# Patient Record
Sex: Male | Born: 1950
Health system: Southern US, Community
[De-identification: ages and names within clinical notes are randomized; demographics above are authoritative.]

## PROBLEM LIST (undated history)

## (undated) DIAGNOSIS — J302 Other seasonal allergic rhinitis: Secondary | ICD-10-CM

## (undated) DIAGNOSIS — Z974 Presence of external hearing-aid: Secondary | ICD-10-CM

## (undated) DIAGNOSIS — R351 Nocturia: Secondary | ICD-10-CM

## (undated) DIAGNOSIS — R509 Fever, unspecified: Secondary | ICD-10-CM

## (undated) DIAGNOSIS — Z87828 Personal history of other (healed) physical injury and trauma: Secondary | ICD-10-CM

## (undated) DIAGNOSIS — J45909 Unspecified asthma, uncomplicated: Secondary | ICD-10-CM

## (undated) DIAGNOSIS — N1330 Unspecified hydronephrosis: Secondary | ICD-10-CM

## (undated) DIAGNOSIS — C61 Malignant neoplasm of prostate: Secondary | ICD-10-CM

## (undated) DIAGNOSIS — N281 Cyst of kidney, acquired: Secondary | ICD-10-CM

## (undated) DIAGNOSIS — I10 Essential (primary) hypertension: Secondary | ICD-10-CM

## (undated) DIAGNOSIS — R911 Solitary pulmonary nodule: Secondary | ICD-10-CM

## (undated) DIAGNOSIS — Z95828 Presence of other vascular implants and grafts: Secondary | ICD-10-CM

## (undated) DIAGNOSIS — Z923 Personal history of irradiation: Secondary | ICD-10-CM

## (undated) DIAGNOSIS — Z973 Presence of spectacles and contact lenses: Secondary | ICD-10-CM

## (undated) DIAGNOSIS — K219 Gastro-esophageal reflux disease without esophagitis: Secondary | ICD-10-CM

## (undated) DIAGNOSIS — C787 Secondary malignant neoplasm of liver and intrahepatic bile duct: Secondary | ICD-10-CM

## (undated) DIAGNOSIS — I6203 Nontraumatic chronic subdural hemorrhage: Secondary | ICD-10-CM

## (undated) DIAGNOSIS — K648 Other hemorrhoids: Secondary | ICD-10-CM

## (undated) DIAGNOSIS — R319 Hematuria, unspecified: Secondary | ICD-10-CM

## (undated) DIAGNOSIS — E785 Hyperlipidemia, unspecified: Secondary | ICD-10-CM

---

## 2001-07-28 ENCOUNTER — Emergency Department (HOSPITAL_COMMUNITY): Admission: EM | Admit: 2001-07-28 | Discharge: 2001-07-28 | Payer: Self-pay | Admitting: Emergency Medicine

## 2003-04-28 ENCOUNTER — Emergency Department (HOSPITAL_COMMUNITY): Admission: EM | Admit: 2003-04-28 | Discharge: 2003-04-28 | Payer: Self-pay | Admitting: Emergency Medicine

## 2003-10-25 ENCOUNTER — Emergency Department (HOSPITAL_COMMUNITY): Admission: EM | Admit: 2003-10-25 | Discharge: 2003-10-25 | Payer: Self-pay | Admitting: Emergency Medicine

## 2010-08-05 ENCOUNTER — Inpatient Hospital Stay (HOSPITAL_COMMUNITY): Admission: EM | Admit: 2010-08-05 | Discharge: 2010-08-06 | Payer: Self-pay | Admitting: Emergency Medicine

## 2010-08-22 ENCOUNTER — Encounter: Admission: RE | Admit: 2010-08-22 | Discharge: 2010-09-19 | Payer: Self-pay | Admitting: Internal Medicine

## 2011-03-06 LAB — DIFFERENTIAL
Basophils Absolute: 0 10*3/uL (ref 0.0–0.1)
Basophils Relative: 0 % (ref 0–1)
Eosinophils Absolute: 0.2 10*3/uL (ref 0.0–0.7)
Eosinophils Relative: 2 % (ref 0–5)
Lymphocytes Relative: 10 % — ABNORMAL LOW (ref 12–46)
Lymphs Abs: 0.9 10*3/uL (ref 0.7–4.0)
Monocytes Absolute: 0.3 10*3/uL (ref 0.1–1.0)
Monocytes Relative: 3 % (ref 3–12)
Neutro Abs: 7.2 10*3/uL (ref 1.7–7.7)
Neutrophils Relative %: 84 % — ABNORMAL HIGH (ref 43–77)

## 2011-03-06 LAB — COMPREHENSIVE METABOLIC PANEL
ALT: 15 U/L (ref 0–53)
AST: 17 U/L (ref 0–37)
Albumin: 3.5 g/dL (ref 3.5–5.2)
Alkaline Phosphatase: 72 U/L (ref 39–117)
BUN: 10 mg/dL (ref 6–23)
CO2: 29 mEq/L (ref 19–32)
Calcium: 8.4 mg/dL (ref 8.4–10.5)
Chloride: 108 mEq/L (ref 96–112)
Creatinine, Ser: 0.78 mg/dL (ref 0.4–1.5)
GFR calc Af Amer: >60 mL/min (ref >60)
GFR calc non Af Amer: >60 mL/min (ref >60)
Glucose, Bld: 96 mg/dL (ref 70–99)
Potassium: 3.5 mEq/L (ref 3.5–5.1)
Sodium: 143 mEq/L (ref 135–145)
Total Bilirubin: 0.8 mg/dL (ref 0.3–1.2)
Total Protein: 6.5 g/dL (ref 6.0–8.3)

## 2011-03-06 LAB — CBC
HCT: 38.1 % — ABNORMAL LOW (ref 39.0–52.0)
HCT: 41.4 % (ref 39.0–52.0)
Hemoglobin: 12.9 g/dL — ABNORMAL LOW (ref 13.0–17.0)
Hemoglobin: 14.3 g/dL (ref 13.0–17.0)
MCH: 27.3 pg (ref 26.0–34.0)
MCH: 27.8 pg (ref 26.0–34.0)
MCHC: 33.7 g/dL (ref 30.0–36.0)
MCHC: 34.5 g/dL (ref 30.0–36.0)
MCV: 80.5 fL (ref 78.0–100.0)
MCV: 81 fL (ref 78.0–100.0)
Platelets: 168 10*3/uL (ref 150–400)
Platelets: 174 10*3/uL (ref 150–400)
RBC: 4.71 MIL/uL (ref 4.22–5.81)
RBC: 5.14 MIL/uL (ref 4.22–5.81)
RDW: 13.3 % (ref 11.5–15.5)
RDW: 13.3 % (ref 11.5–15.5)
WBC: 3.6 10*3/uL — ABNORMAL LOW (ref 4.0–10.5)
WBC: 8.6 10*3/uL (ref 4.0–10.5)

## 2011-03-06 LAB — POCT I-STAT, CHEM 8
BUN: 15 mg/dL (ref 6–23)
Calcium, Ion: 1.11 mmol/L — ABNORMAL LOW (ref 1.12–1.32)
Chloride: 104 mEq/L (ref 96–112)
Creatinine, Ser: 0.9 mg/dL (ref 0.4–1.5)
Glucose, Bld: 105 mg/dL — ABNORMAL HIGH (ref 70–99)
HCT: 46 % (ref 39.0–52.0)
Hemoglobin: 15.6 g/dL (ref 13.0–17.0)
Potassium: 3.5 mEq/L (ref 3.5–5.1)
Sodium: 140 mEq/L (ref 135–145)
TCO2: 30 mmol/L (ref 0–100)

## 2011-03-06 LAB — LIPID PANEL
Cholesterol: 218 mg/dL — ABNORMAL HIGH (ref 0–200)
HDL: 38 mg/dL — ABNORMAL LOW (ref >39)
LDL Cholesterol: 153 mg/dL — ABNORMAL HIGH (ref 0–99)
Total CHOL/HDL Ratio: 5.7 RATIO
Triglycerides: 136 mg/dL (ref <150)
VLDL: 27 mg/dL (ref 0–40)

## 2011-03-06 LAB — TSH: TSH: 1.001 u[IU]/mL (ref 0.350–4.500)

## 2011-03-06 LAB — CK TOTAL AND CKMB (NOT AT ARMC)
CK, MB: 5.7 ng/mL — ABNORMAL HIGH (ref 0.3–4.0)
Relative Index: 3.6 — ABNORMAL HIGH (ref 0.0–2.5)
Total CK: 158 U/L (ref 7–232)

## 2011-03-06 LAB — HEMOGLOBIN A1C
Hgb A1c MFr Bld: 5.4 % (ref <5.7)
Mean Plasma Glucose: 108 mg/dL (ref <117)

## 2012-10-26 ENCOUNTER — Emergency Department (HOSPITAL_COMMUNITY): Payer: Self-pay

## 2012-10-26 ENCOUNTER — Encounter (HOSPITAL_COMMUNITY): Payer: Self-pay | Admitting: Family Medicine

## 2012-10-26 ENCOUNTER — Emergency Department (HOSPITAL_COMMUNITY)
Admission: EM | Admit: 2012-10-26 | Discharge: 2012-10-26 | Disposition: A | Discharge status: Home / Self Care | Payer: Self-pay | Attending: Emergency Medicine | Admitting: Emergency Medicine

## 2012-10-26 DIAGNOSIS — R42 Dizziness and giddiness: Secondary | ICD-10-CM | POA: Insufficient documentation

## 2012-10-26 DIAGNOSIS — J45909 Unspecified asthma, uncomplicated: Secondary | ICD-10-CM | POA: Insufficient documentation

## 2012-10-26 DIAGNOSIS — R079 Chest pain, unspecified: Secondary | ICD-10-CM | POA: Insufficient documentation

## 2012-10-26 LAB — CBC WITH DIFFERENTIAL/PLATELET
Basophils Absolute: 0 10*3/uL (ref 0.0–0.1)
Basophils Relative: 0 % (ref 0–1)
Eosinophils Absolute: 0.3 10*3/uL (ref 0.0–0.7)
Eosinophils Relative: 5 % (ref 0–5)
HCT: 39.9 % (ref 39.0–52.0)
Hemoglobin: 13.8 g/dL (ref 13.0–17.0)
Lymphocytes Relative: 36 % (ref 12–46)
Lymphs Abs: 1.8 10*3/uL (ref 0.7–4.0)
MCH: 27.3 pg (ref 26.0–34.0)
MCHC: 34.6 g/dL (ref 30.0–36.0)
MCV: 79 fL (ref 78.0–100.0)
Monocytes Absolute: 0.4 10*3/uL (ref 0.1–1.0)
Monocytes Relative: 8 % (ref 3–12)
Neutro Abs: 2.5 10*3/uL (ref 1.7–7.7)
Neutrophils Relative %: 51 % (ref 43–77)
Platelets: 192 10*3/uL (ref 150–400)
RBC: 5.05 MIL/uL (ref 4.22–5.81)
RDW: 12.8 % (ref 11.5–15.5)
WBC: 5 10*3/uL (ref 4.0–10.5)

## 2012-10-26 LAB — BASIC METABOLIC PANEL
BUN: 15 mg/dL (ref 6–23)
CO2: 29 mEq/L (ref 19–32)
Calcium: 9.4 mg/dL (ref 8.4–10.5)
Chloride: 99 mEq/L (ref 96–112)
Creatinine, Ser: 1 mg/dL (ref 0.50–1.35)
GFR calc Af Amer: >90 mL/min (ref >90)
GFR calc non Af Amer: 79 mL/min — ABNORMAL LOW (ref >90)
Glucose, Bld: 125 mg/dL — ABNORMAL HIGH (ref 70–99)
Potassium: 3.6 mEq/L (ref 3.5–5.1)
Sodium: 139 mEq/L (ref 135–145)

## 2012-10-26 LAB — TROPONIN I: Troponin I: <0.3 ng/mL (ref <0.30)

## 2012-10-26 LAB — POCT I-STAT TROPONIN I: Troponin i, poc: 0.04 ng/mL (ref 0.00–0.08)

## 2012-10-26 MED ORDER — MECLIZINE HCL 25 MG PO TABS: 25.0000 mg | ORAL_TABLET | Freq: Three times a day (TID) | ORAL | Status: DC | PRN

## 2012-10-26 MED ORDER — MECLIZINE HCL 25 MG PO TABS
25.0000 mg | ORAL_TABLET | Freq: Once | ORAL | Status: AC
  Administered 2012-10-26: 25 mg via ORAL
  Filled 2012-10-26: qty 1

## 2012-10-26 MED ORDER — GI COCKTAIL ~~LOC~~
30.0000 mL | Freq: Once | ORAL | Status: AC
  Administered 2012-10-26: 30 mL via ORAL
  Filled 2012-10-26: qty 30

## 2012-10-26 MED ORDER — IBUPROFEN 400 MG PO TABS
600.0000 mg | ORAL_TABLET | Freq: Once | ORAL | Status: AC
  Administered 2012-10-26: 600 mg via ORAL
  Filled 2012-10-26: qty 2

## 2012-10-26 NOTE — ED Provider Notes (Signed)
I saw and evaluated the patient, reviewed the resident's note and I agree with the findings and plan. I have reviewed EKG and agree with the resident interpretation.  you Patient with history of vertigo who presents today do to feeling dizzy. He was recently evaluated for this with a head CT that showed bilateral hygromas. Patient denies any chest pain currently. He states he gets it about once a month with lying down it sounds more like reflux. He has no medical problems other than asthma and is low risk for cardiac disease.  Labs here today were benign EKG is unrevealing. Patient was discharged home.  Gwyneth Sprout, MD 10/26/12 2357

## 2012-10-26 NOTE — ED Notes (Addendum)
Per family pt having chest pain, dizziness and weakness that has been going on for a while. sts nausea. Denies SOB. sts pain is worse when he is sleeping.

## 2012-10-26 NOTE — ED Notes (Signed)
Patient transported to CT 

## 2012-10-26 NOTE — ED Provider Notes (Signed)
History     CSN: [id number]  Arrival date & time 10/26/12  1655   First MD Initiated Contact with Patient 10/26/12 1904      Chief Complaint  Patient presents with  . Chest Pain    (Consider location/radiation/quality/duration/timing/severity/associated sxs/prior treatment) HPI Interpreter used to obtain history. Used interpreter phone. Also discussed with several of patient's family members.  [name] Johnston is 61 y.o. 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 Describes the dizziness as being similar to when he was diagnosed with vertigo during a previous visit at [facility]. Currently is asymptomatic. Denies any chest pain today or now but does state that he occasionally gets chest pain at night when he lies down flat. Described as a burning. He has not taken any medications. Pt does not have a PCP. Dizziness made worse by standing up and position changes. Described as moderately severe. Occurred in past 1-2 hours. Denies any LOC, no fever/chills. No SOB. Does complain of some R heel pain from prior fall at work   Past Medical History  Diagnosis Date  . Asthma     History reviewed. No pertinent past surgical history.  History reviewed. No pertinent family history.  History  Substance Use Topics  . Smoking status: Never Smoker   . Smokeless tobacco: Not on file  . Alcohol Use: No      Review of Systems Negative for respiratory distress, cough. Negative for vomiting, diarrhea.  All other systems reviewed and negative unless noted in HPI.    Allergies  Aspirin  Home Medications  No current outpatient prescriptions on file.  BP 147/74  Pulse 67  Temp 98 F (36.7 C) (Oral)  Resp 18  SpO2 96%  Physical Exam Nursing note and vitals reviewed.  Constitutional: Pt is alert and appears stated age. Oropharynx: Airway open without erythema or exudate. Respiratory: No respiratory distress. Equal breathing bilaterally. CV:  Extremities warm and well perfused. Neuro: No motor nor sensory deficit. GCS 15. Normal coordination.  Head: Normocephalic and atraumatic. Eyes: No conjunctivitis, no scleral icterus. Neck: Supple, no mass. Chest: Non-tender. Abdomen: Soft, non-tender MSK: Extremities are atraumatic without deformity. R achillies intact with no  Skin: No rash, no wounds.  ED Course  Procedures (including critical care time)  Labs Reviewed  BASIC METABOLIC PANEL - Abnormal; Notable for the following:    Glucose, Bld 125 (*)     GFR calc non Af Amer 79 (*)     All other components within normal limits  CBC WITH DIFFERENTIAL  POCT I-STAT TROPONIN I   Dg Chest 2 View  10/26/2012  *RADIOLOGY REPORT*  Clinical Data: Chest pain  CHEST - 2 VIEW  Comparison: 08/05/2010  Findings: Cardiomediastinal silhouette is stable.  No acute infiltrate or pleural effusion.  No pulmonary edema.  Mild degenerative changes lower thoracic spine.  IMPRESSION: No active disease.  No significant change.   Original Report Authenticated By: Natasha Mead, M.D.      1. Vertigo   2. Chest pain       MDM  61 y.o. male here with non-specific dizziness described more consistently with vertigo. History not c/w with pre-syncope. Even though chest pain as chief complaint pt without any chest pain now, during event, or today. Only at night while lying down more consistent with GERD. 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 Chart review shows bilateral subdural hygromas, left  greater than right. Work up here with unchanged CT head with NAICa, CXR with no active disease. EKG ordered and interpreted by me: NSR, no axis deviation, no QRS widening, no ST segment changes and no prior EKG available for comparison.  Lab tests ordered and reviewed by me: CBC, BMP unremarkable. Troponin low. On re-eval, pt resting comfortably with NAD. Complains of mild frontal headache. Able to get patient up to  walk which he can do without difficulty. Plan to treat symptoms and have pt f/u with PCP. Able to discuss with daughter who is now at bedside. Discussed results at length. Questions answered. Patient does not have PCP but daughter has been prompting him to get one and will call to schedule an appointment for him. Discussed return precautions at length and had pt and family verbalize understanding.   Medical Decision Making discussed with ED attending Gwyneth Sprout, MD          Charm Barges, MD 10/26/12 [phone]

## 2012-10-26 NOTE — ED Notes (Signed)
Pt back from CT

## 2012-10-26 NOTE — ED Notes (Signed)
EKG completed at 1710 and signed by Dr. Lynelle Doctor. NO old ekg.

## 2012-10-26 NOTE — ED Notes (Signed)
EDP at [facility] with translator phone.

## 2012-10-26 NOTE — ED Notes (Signed)
Discharge instructions reviewed with pt and family; son verbalized understanding of follow-up care and given a list of resources for a pcp as requested.

## 2013-04-17 ENCOUNTER — Ambulatory Visit (INDEPENDENT_AMBULATORY_CARE_PROVIDER_SITE_OTHER): Payer: PRIVATE HEALTH INSURANCE | Admitting: Family Medicine

## 2013-04-17 VITALS — BP 118/80 | HR 70 | Temp 98.8°F | Resp 16 | Ht 61.0 in | Wt 138.0 lb

## 2013-04-17 DIAGNOSIS — R972 Elevated prostate specific antigen [PSA]: Secondary | ICD-10-CM

## 2013-04-17 DIAGNOSIS — R51 Headache: Secondary | ICD-10-CM

## 2013-04-17 DIAGNOSIS — R300 Dysuria: Secondary | ICD-10-CM

## 2013-04-17 DIAGNOSIS — L309 Dermatitis, unspecified: Secondary | ICD-10-CM

## 2013-04-17 LAB — POCT UA - MICROSCOPIC ONLY
Casts, Ur, LPF, POC: NEGATIVE
Crystals, Ur, HPF, POC: NEGATIVE
Yeast, UA: NEGATIVE

## 2013-04-17 LAB — POCT URINALYSIS DIPSTICK
Leukocytes, UA: NEGATIVE
Protein, UA: 30
Urobilinogen, UA: 0.2

## 2013-04-17 MED ORDER — TRIAMCINOLONE ACETONIDE 0.1 % EX CREA: TOPICAL_CREAM | Freq: Two times a day (BID) | CUTANEOUS | Status: DC

## 2013-04-17 MED ORDER — CEPHALEXIN 500 MG PO CAPS: 500.0000 mg | ORAL_CAPSULE | Freq: Two times a day (BID) | ORAL | Status: DC

## 2013-04-17 MED ORDER — TRAMADOL HCL 50 MG PO TABS: 50.0000 mg | ORAL_TABLET | Freq: Three times a day (TID) | ORAL | Status: DC | PRN
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
## 2013-04-17 NOTE — Patient Instructions (Addendum)
We are going to arrange an appointment for you to see a specialist regarding your headaches. If they change in the meantime please let me know.  You may also try the tramadol for your headaches in the meantime.  Please use the keflex antibiotic for a possible urinary tract infection. I will let you know when the rest of your labs come in.    Use the triamcinolone cream on your legs, up to twice a day as needed.  Let me know if not better in the next couple of weeks

## 2013-04-18 NOTE — Addendum Note (Signed)
Addended by: Abbe Amsterdam C on: 04/18/2013 07:53 AM   Modules accepted: Orders

## 2013-04-19 LAB — URINE CULTURE: Colony Count: NO GROWTH

## 2013-04-21 ENCOUNTER — Telehealth: Payer: Self-pay

## 2013-04-21 ENCOUNTER — Telehealth: Payer: Self-pay | Admitting: Radiology

## 2013-04-21 NOTE — Telephone Encounter (Signed)
SIMON STATES HE HAVE TO COME AND P/U A CD FOR HIS DAD. DR COPLAND REFERRED HIM, BUT THEY NEED THE CD BEFORE THAY CAN SCHEDULE PLEASE CALL [phone]

## 2013-04-21 NOTE — Telephone Encounter (Signed)
Request given to Xray 

## 2013-04-21 NOTE — Telephone Encounter (Signed)
Spoke with Melvenia Beam explaining xrays not performed here at our office; most recent xrays- CT scan in 2013.  Referrals in progress.  Melvenia Beam states father [place]) still having pain with urination and difficult time urinating.  Told Simon to bring father in if he can not urinate or is in distress.  In meantime will check on referral to urologist.

## 2013-04-25 NOTE — Telephone Encounter (Signed)
Called to check on him and spoke to [place].  Melvenia Beam stated that his dad has a bad HA and has vomited.  Instructed him to take his father in to the ER or the clinic today.  He took this under advisement and plans to bring him in.

## 2013-05-06 NOTE — Pre-Procedure Instructions (Addendum)
Elvie Maines  05/06/2013   Your procedure is scheduled on:  Tuesday, May 20th  Report to [facility] at 0915 AM.  Call this number if you have problems the morning of surgery: [phone]   Remember:   Do not eat food or drink liquids after midnight.    Take these medicines the morning of surgery with A SIP OF WATER:                albuterol (PROVENTIL HFA;VENTOLIN HFA) 108 (90 BASE) MCG/ACT inhaler, for wheezing  traMADol (ULTRAM) 50 MG tablet for pain  Stop taking Aspirin, and herbal medications. Do not take any NSAIDs ie: Ibuprofen, Advil, Naproxen or any medication containing Aspirin.  Do not wear jewelry, make-up or nail polish.  Do not wear lotions, powders, or perfumes. You may wear deodorant.  Do not shave 48 hours prior to surgery. Men may shave face and neck.  Do not bring valuables to the hospital.  Contacts, dentures or bridgework may not be worn into surgery.  Leave suitcase in the car. After surgery it may be brought to your room.  For patients admitted to the hospital, checkout time is 11:00 AM the day of discharge.   Patients discharged the day of surgery will not be allowed to drive home.   Special Instructions: Shower using CHG 2 nights before surgery and the night before surgery.  If you shower the day of surgery use CHG.  Use special wash - you have one bottle of CHG for all showers.  You should use approximately 1/3 of the bottle for each shower.   Please read over the following fact sheets that you were given: Pain Booklet, Coughing and Deep Breathing, MRSA Information and Surgical Site Infection Prevention

## 2013-05-08 ENCOUNTER — Telehealth: Payer: Self-pay

## 2013-05-08 ENCOUNTER — Encounter (HOSPITAL_COMMUNITY): Payer: Self-pay

## 2013-05-08 ENCOUNTER — Encounter (HOSPITAL_COMMUNITY)
Admission: RE | Admit: 2013-05-08 | Discharge: 2013-05-08 | Disposition: A | Discharge status: Home / Self Care | Payer: PRIVATE HEALTH INSURANCE | Source: Ambulatory Visit | Attending: Neurosurgery | Admitting: Neurosurgery

## 2013-05-08 ENCOUNTER — Encounter (HOSPITAL_COMMUNITY)
Admission: RE | Admit: 2013-05-08 | Discharge: 2013-05-08 | Disposition: A | Discharge status: Home / Self Care | Payer: PRIVATE HEALTH INSURANCE | Source: Ambulatory Visit | Attending: Anesthesiology | Admitting: Anesthesiology

## 2013-05-08 ENCOUNTER — Other Ambulatory Visit: Payer: Self-pay | Admitting: Neurosurgery

## 2013-05-08 HISTORY — DX: Other seasonal allergic rhinitis: J30.2

## 2013-05-08 HISTORY — DX: Hyperlipidemia, unspecified: E78.5

## 2013-05-08 LAB — CBC
Platelets: 183 10*3/uL (ref 150–400)
RBC: 5.17 MIL/uL (ref 4.22–5.81)
RDW: 12.6 % (ref 11.5–15.5)
WBC: 5.6 10*3/uL (ref 4.0–10.5)

## 2013-05-08 LAB — SURGICAL PCR SCREEN
MRSA, PCR: NEGATIVE
Staphylococcus aureus: NEGATIVE

## 2013-05-08 LAB — BASIC METABOLIC PANEL
Calcium: 9.2 mg/dL (ref 8.4–10.5)
Creatinine, Ser: 0.88 mg/dL (ref 0.50–1.35)
GFR calc non Af Amer: 90 mL/min — ABNORMAL LOW (ref >90)
Sodium: 139 mEq/L (ref 135–145)

## 2013-05-08 NOTE — Progress Notes (Signed)
Pt advised to bring all medications for pharmacy review on DOS because unsure of some medications. Pt reminded not to forget inhaler.

## 2013-05-08 NOTE — Telephone Encounter (Signed)
No, he is not cleared, he can come in if he needs to be seen. She states he has been cleared from Urology already.

## 2013-05-08 NOTE — Telephone Encounter (Signed)
SARAH STATES HER FATHER HAVE TO HAVE SURGERY IN THE MORNING AND DR POOLE FROM [facility] WANTED TO MAKE SURE PT WAS CLEARED TO HAVE THE SURGERY SINCE SOMETHING WAS WRONG WITH HIS PROSTATE. PLEASE CALL [facility] AT [phone] AND YOU MAY CALL THE SECRETARY AT [phone] [phone] AND LET HER KNOW

## 2013-05-08 NOTE — Progress Notes (Signed)
Pt denies SOB, chest pain, and being under the care of a cardiologist. Pt speaks [place] however, refuses the services of an interpreter ( after pt education on the consequences of such a refusal); pt insists on using oldest daughter.

## 2013-05-09 ENCOUNTER — Encounter (HOSPITAL_COMMUNITY): Payer: Self-pay | Admitting: Anesthesiology

## 2013-05-09 ENCOUNTER — Ambulatory Visit (HOSPITAL_COMMUNITY): Payer: PRIVATE HEALTH INSURANCE | Admitting: Anesthesiology

## 2013-05-09 ENCOUNTER — Encounter (HOSPITAL_COMMUNITY)
Admission: RE | Disposition: A | Discharge status: Home / Self Care | Payer: Self-pay | Source: Ambulatory Visit | Attending: Neurosurgery

## 2013-05-09 ENCOUNTER — Inpatient Hospital Stay (HOSPITAL_COMMUNITY)
Admission: RE | Admit: 2013-05-09 | Discharge: 2013-05-10 | DRG: 027 | Disposition: A | Discharge status: Home / Self Care | Payer: PRIVATE HEALTH INSURANCE | Source: Ambulatory Visit | Attending: Neurosurgery | Admitting: Neurosurgery

## 2013-05-09 DIAGNOSIS — E785 Hyperlipidemia, unspecified: Secondary | ICD-10-CM | POA: Diagnosis present

## 2013-05-09 DIAGNOSIS — S065XAA Traumatic subdural hemorrhage with loss of consciousness status unknown, initial encounter: Secondary | ICD-10-CM | POA: Diagnosis present

## 2013-05-09 DIAGNOSIS — S065X9A Traumatic subdural hemorrhage with loss of consciousness of unspecified duration, initial encounter: Secondary | ICD-10-CM | POA: Diagnosis present

## 2013-05-09 DIAGNOSIS — I6200 Nontraumatic subdural hemorrhage, unspecified: Principal | ICD-10-CM | POA: Diagnosis present

## 2013-05-09 DIAGNOSIS — R339 Retention of urine, unspecified: Secondary | ICD-10-CM | POA: Diagnosis present

## 2013-05-09 DIAGNOSIS — J45909 Unspecified asthma, uncomplicated: Secondary | ICD-10-CM | POA: Diagnosis present

## 2013-05-09 DIAGNOSIS — Z886 Allergy status to analgesic agent status: Secondary | ICD-10-CM

## 2013-05-09 DIAGNOSIS — R51 Headache: Secondary | ICD-10-CM | POA: Diagnosis present

## 2013-05-09 DIAGNOSIS — Z79899 Other long term (current) drug therapy: Secondary | ICD-10-CM

## 2013-05-09 HISTORY — PX: BURR HOLE: SHX908

## 2013-05-09 SURGERY — CREATION, CRANIAL BURR HOLE
Anesthesia: General | Site: Head | Laterality: Left | Wound class: Clean

## 2013-05-09 MED ORDER — THROMBIN 20000 UNITS EX SOLR
CUTANEOUS | Status: DC | PRN
  Administered 2013-05-09: 11:00:00 via TOPICAL

## 2013-05-09 MED ORDER — TAMSULOSIN HCL 0.4 MG PO CAPS
0.8000 mg | ORAL_CAPSULE | Freq: Every day | ORAL | Status: DC
  Administered 2013-05-09: 0.8 mg via ORAL
  Filled 2013-05-09 (×2): qty 2

## 2013-05-09 MED ORDER — ROCURONIUM BROMIDE 100 MG/10ML IV SOLN
INTRAVENOUS | Status: DC | PRN
  Administered 2013-05-09: 30 mg via INTRAVENOUS

## 2013-05-09 MED ORDER — ARTIFICIAL TEARS OP OINT
TOPICAL_OINTMENT | OPHTHALMIC | Status: DC | PRN
  Administered 2013-05-09: 1 via OPHTHALMIC

## 2013-05-09 MED ORDER — ACETAMINOPHEN 650 MG RE SUPP: 650.0000 mg | RECTAL | Status: DC | PRN

## 2013-05-09 MED ORDER — ONDANSETRON HCL 4 MG/2ML IJ SOLN
INTRAMUSCULAR | Status: DC | PRN
  Administered 2013-05-09: 4 mg via INTRAVENOUS

## 2013-05-09 MED ORDER — PROMETHAZINE HCL 25 MG PO TABS: 12.5000 mg | ORAL_TABLET | ORAL | Status: DC | PRN

## 2013-05-09 MED ORDER — CEFAZOLIN SODIUM 1-5 GM-% IV SOLN
1.0000 g | Freq: Once | INTRAVENOUS | Status: AC
  Administered 2013-05-09: 1 g via INTRAVENOUS

## 2013-05-09 MED ORDER — ONDANSETRON HCL 4 MG/2ML IJ SOLN
4.0000 mg | INTRAMUSCULAR | Status: DC | PRN
  Administered 2013-05-09: 4 mg via INTRAVENOUS
  Filled 2013-05-09: qty 2

## 2013-05-09 MED ORDER — ACETAMINOPHEN 325 MG PO TABS: 650.0000 mg | ORAL_TABLET | ORAL | Status: DC | PRN

## 2013-05-09 MED ORDER — LACTATED RINGERS IV SOLN
INTRAVENOUS | Status: DC | PRN
  Administered 2013-05-09 (×2): via INTRAVENOUS

## 2013-05-09 MED ORDER — CEFAZOLIN SODIUM 1-5 GM-% IV SOLN
1.0000 g | Freq: Three times a day (TID) | INTRAVENOUS | Status: AC
  Administered 2013-05-09 – 2013-05-10 (×2): 1 g via INTRAVENOUS
  Filled 2013-05-09 (×2): qty 50

## 2013-05-09 MED ORDER — OXYCODONE HCL 5 MG PO TABS: 5.0000 mg | ORAL_TABLET | Freq: Once | ORAL | Status: DC | PRN

## 2013-05-09 MED ORDER — LIDOCAINE HCL (CARDIAC) 20 MG/ML IV SOLN
INTRAVENOUS | Status: DC | PRN
  Administered 2013-05-09: 50 mg via INTRAVENOUS

## 2013-05-09 MED ORDER — LIDOCAINE HCL 4 % MT SOLN
OROMUCOSAL | Status: DC | PRN
  Administered 2013-05-09: 4 mL via TOPICAL

## 2013-05-09 MED ORDER — LIDOCAINE-EPINEPHRINE 1 %-1:100000 IJ SOLN
INTRAMUSCULAR | Status: DC | PRN
  Administered 2013-05-09: 6 mL

## 2013-05-09 MED ORDER — 0.9 % SODIUM CHLORIDE (POUR BTL) OPTIME
TOPICAL | Status: DC | PRN
  Administered 2013-05-09: 1000 mL

## 2013-05-09 MED ORDER — GLYCOPYRROLATE 0.2 MG/ML IJ SOLN
INTRAMUSCULAR | Status: DC | PRN
  Administered 2013-05-09: 0.4 mg via INTRAVENOUS

## 2013-05-09 MED ORDER — EPHEDRINE SULFATE 50 MG/ML IJ SOLN
INTRAMUSCULAR | Status: DC | PRN
  Administered 2013-05-09 (×6): 5 mg via INTRAVENOUS

## 2013-05-09 MED ORDER — OXYCODONE HCL 5 MG/5ML PO SOLN: 5.0000 mg | Freq: Once | ORAL | Status: DC | PRN

## 2013-05-09 MED ORDER — LABETALOL HCL 5 MG/ML IV SOLN: 10.0000 mg | INTRAVENOUS | Status: DC | PRN

## 2013-05-09 MED ORDER — CEFAZOLIN SODIUM 1-5 GM-% IV SOLN
INTRAVENOUS | Status: AC
  Filled 2013-05-09: qty 50

## 2013-05-09 MED ORDER — HYDROMORPHONE HCL PF 1 MG/ML IJ SOLN: 0.2500 mg | INTRAMUSCULAR | Status: DC | PRN

## 2013-05-09 MED ORDER — PROPOFOL 10 MG/ML IV BOLUS
INTRAVENOUS | Status: DC | PRN
  Administered 2013-05-09: 140 mg via INTRAVENOUS

## 2013-05-09 MED ORDER — NEOSTIGMINE METHYLSULFATE 1 MG/ML IJ SOLN
INTRAMUSCULAR | Status: DC | PRN
  Administered 2013-05-09: 3 mg via INTRAVENOUS

## 2013-05-09 MED ORDER — PANTOPRAZOLE SODIUM 40 MG IV SOLR
40.0000 mg | Freq: Every day | INTRAVENOUS | Status: DC
  Administered 2013-05-09: 40 mg via INTRAVENOUS
  Filled 2013-05-09 (×2): qty 40

## 2013-05-09 MED ORDER — HYDROCODONE-ACETAMINOPHEN 5-325 MG PO TABS
1.0000 | ORAL_TABLET | ORAL | Status: DC | PRN
  Administered 2013-05-09 – 2013-05-10 (×5): 1 via ORAL
  Filled 2013-05-09 (×5): qty 1

## 2013-05-09 MED ORDER — ONDANSETRON HCL 4 MG PO TABS: 4.0000 mg | ORAL_TABLET | ORAL | Status: DC | PRN

## 2013-05-09 MED ORDER — BACITRACIN 50000 UNITS IM SOLR
INTRAMUSCULAR | Status: AC
  Filled 2013-05-09: qty 1

## 2013-05-09 MED ORDER — HYDROMORPHONE HCL PF 1 MG/ML IJ SOLN
0.5000 mg | INTRAMUSCULAR | Status: DC | PRN
  Administered 2013-05-09: 1 mg via INTRAVENOUS
  Filled 2013-05-09: qty 1

## 2013-05-09 MED ORDER — SODIUM CHLORIDE 0.9 % IV SOLN
INTRAVENOUS | Status: AC
  Filled 2013-05-09: qty 500

## 2013-05-09 MED ORDER — SODIUM CHLORIDE 0.9 % IR SOLN
Status: DC | PRN
  Administered 2013-05-09: 11:00:00

## 2013-05-09 MED ORDER — PHENYLEPHRINE HCL 10 MG/ML IJ SOLN
INTRAMUSCULAR | Status: DC | PRN
  Administered 2013-05-09: 40 ug via INTRAVENOUS

## 2013-05-09 MED ORDER — FENTANYL CITRATE 0.05 MG/ML IJ SOLN
INTRAMUSCULAR | Status: DC | PRN
  Administered 2013-05-09 (×3): 25 ug via INTRAVENOUS

## 2013-05-09 MED ORDER — ALBUTEROL SULFATE HFA 108 (90 BASE) MCG/ACT IN AERS: 1.0000 | INHALATION_SPRAY | RESPIRATORY_TRACT | Status: DC | PRN

## 2013-05-09 MED ORDER — PROMETHAZINE HCL 25 MG/ML IJ SOLN: 6.2500 mg | INTRAMUSCULAR | Status: DC | PRN

## 2013-05-09 MED ORDER — TRAMADOL HCL 50 MG PO TABS
50.0000 mg | ORAL_TABLET | Freq: Four times a day (QID) | ORAL | Status: DC | PRN
  Administered 2013-05-10: 100 mg via ORAL
  Filled 2013-05-09: qty 2

## 2013-05-09 SURGICAL SUPPLY — 60 items
BAG DECANTER FOR FLEXI CONT (MISCELLANEOUS) ×2 IMPLANT
BANDAGE GAUZE 4  KLING STR (GAUZE/BANDAGES/DRESSINGS) IMPLANT
BLADE SURG 11 STRL SS (BLADE) ×2 IMPLANT
BLADE SURG ROTATE 9660 (MISCELLANEOUS) IMPLANT
BNDG COHESIVE 4X5 TAN NS LF (GAUZE/BANDAGES/DRESSINGS) IMPLANT
BRUSH SCRUB EZ PLAIN DRY (MISCELLANEOUS) ×2 IMPLANT
BUR ACORN 6.0 (BURR) ×2 IMPLANT
BUR ADDG 1.1 (BURR) IMPLANT
CANISTER SUCTION 2500CC (MISCELLANEOUS) ×2 IMPLANT
CLIP TI MEDIUM 6 (CLIP) IMPLANT
CLOTH BEACON ORANGE TIMEOUT ST (SAFETY) ×2 IMPLANT
CONT SPEC 4OZ CLIKSEAL STRL BL (MISCELLANEOUS) ×2 IMPLANT
CORDS BIPOLAR (ELECTRODE) ×2 IMPLANT
DECANTER SPIKE VIAL GLASS SM (MISCELLANEOUS) ×2 IMPLANT
DERMABOND ADHESIVE PROPEN (GAUZE/BANDAGES/DRESSINGS) ×1
DERMABOND ADVANCED (GAUZE/BANDAGES/DRESSINGS) ×1
DERMABOND ADVANCED .7 DNX12 (GAUZE/BANDAGES/DRESSINGS) ×1 IMPLANT
DERMABOND ADVANCED .7 DNX6 (GAUZE/BANDAGES/DRESSINGS) ×1 IMPLANT
DRAPE NEUROLOGICAL W/INCISE (DRAPES) IMPLANT
DRAPE WARM FLUID 44X44 (DRAPE) ×2 IMPLANT
ELECT CAUTERY BLADE 6.4 (BLADE) ×2 IMPLANT
ELECT REM PT RETURN 9FT ADLT (ELECTROSURGICAL) ×2
ELECTRODE REM PT RTRN 9FT ADLT (ELECTROSURGICAL) ×1 IMPLANT
GAUZE SPONGE 4X4 16PLY XRAY LF (GAUZE/BANDAGES/DRESSINGS) IMPLANT
GLOVE ECLIPSE 8.5 STRL (GLOVE) IMPLANT
GLOVE EXAM NITRILE LRG STRL (GLOVE) IMPLANT
GLOVE EXAM NITRILE MD LF STRL (GLOVE) IMPLANT
GLOVE EXAM NITRILE XL STR (GLOVE) IMPLANT
GLOVE EXAM NITRILE XS STR PU (GLOVE) IMPLANT
GOWN BRE IMP SLV AUR LG STRL (GOWN DISPOSABLE) ×2 IMPLANT
GOWN BRE IMP SLV AUR XL STRL (GOWN DISPOSABLE) ×2 IMPLANT
GOWN STRL REIN 2XL LVL4 (GOWN DISPOSABLE) ×2 IMPLANT
HEMOSTAT SURGICEL 2X14 (HEMOSTASIS) IMPLANT
HOOK DURA (MISCELLANEOUS) ×2 IMPLANT
KIT BASIN OR (CUSTOM PROCEDURE TRAY) ×2 IMPLANT
KIT ROOM TURNOVER OR (KITS) ×2 IMPLANT
NEEDLE HYPO 25X1 1.5 SAFETY (NEEDLE) ×2 IMPLANT
NS IRRIG 1000ML POUR BTL (IV SOLUTION) ×2 IMPLANT
PACK CRANIOTOMY (CUSTOM PROCEDURE TRAY) ×2 IMPLANT
PAD ARMBOARD 7.5X6 YLW CONV (MISCELLANEOUS) ×6 IMPLANT
PATTIES SURGICAL .25X.25 (GAUZE/BANDAGES/DRESSINGS) IMPLANT
PATTIES SURGICAL .5 X.5 (GAUZE/BANDAGES/DRESSINGS) IMPLANT
PATTIES SURGICAL .5 X3 (DISPOSABLE) IMPLANT
PATTIES SURGICAL 1X1 (DISPOSABLE) IMPLANT
PIN MAYFIELD SKULL DISP (PIN) IMPLANT
PLATE 1.5/0.5 13MM BURR HOLE (Plate) ×2 IMPLANT
SCREW SELF DRILL HT 1.5/4MM (Screw) ×4 IMPLANT
SPONGE GAUZE 4X4 12PLY (GAUZE/BANDAGES/DRESSINGS) IMPLANT
SPONGE NEURO XRAY DETECT 1X3 (DISPOSABLE) IMPLANT
SPONGE SURGIFOAM ABS GEL 100 (HEMOSTASIS) IMPLANT
STAPLER VISISTAT 35W (STAPLE) ×2 IMPLANT
SUT NURALON 4 0 TR CR/8 (SUTURE) ×4 IMPLANT
SUT VIC AB 2-0 CT1 18 (SUTURE) ×2 IMPLANT
SUT VIC AB 3-0 SH 8-18 (SUTURE) ×2 IMPLANT
SYR 20ML ECCENTRIC (SYRINGE) ×2 IMPLANT
SYR CONTROL 10ML LL (SYRINGE) ×2 IMPLANT
TOWEL OR 17X24 6PK STRL BLUE (TOWEL DISPOSABLE) ×2 IMPLANT
TOWEL OR 17X26 10 PK STRL BLUE (TOWEL DISPOSABLE) ×2 IMPLANT
TRAY FOLEY CATH 14FRSI W/METER (CATHETERS) IMPLANT
WATER STERILE IRR 1000ML POUR (IV SOLUTION) ×2 IMPLANT

## 2013-05-09 NOTE — Brief Op Note (Signed)
05/09/2013  11:39 AM  PATIENT:  [name] Johnston  62 y.o. male  PRE-OPERATIVE DIAGNOSIS:  subdural hematoma  POST-OPERATIVE DIAGNOSIS:  subdural hematoma  PROCEDURE:  Procedure(s): BURR HOLES (Left)  SURGEON:  Surgeon(s) and Role:    * Temple Pacini, MD - Primary  PHYSICIAN ASSISTANT:   ASSISTANTS: none   ANESTHESIA:   general  EBL:     BLOOD ADMINISTERED:none  DRAINS: none   LOCAL MEDICATIONS USED:  LIDOCAINE   SPECIMEN:  No Specimen  DISPOSITION OF SPECIMEN:  N/A  COUNTS:  YES  TOURNIQUET:  * No tourniquets in log *  DICTATION: .Dragon Dictation  PLAN OF CARE: Admit to inpatient   PATIENT DISPOSITION:  PACU - hemodynamically stable.   Delay start of Pharmacological VTE agent (>24hrs) due to surgical blood loss or risk of bleeding: yes

## 2013-05-09 NOTE — Progress Notes (Signed)
UR completed 

## 2013-05-09 NOTE — Anesthesia Procedure Notes (Signed)
Procedure Name: Intubation Date/Time: 05/09/2013 11:07 AM Performed by: Luster Landsberg Pre-anesthesia Checklist: Patient identified, Emergency Drugs available and Suction available Patient Re-evaluated:Patient Re-evaluated prior to inductionOxygen Delivery Method: Circle system utilized Preoxygenation: Pre-oxygenation with 100% oxygen Intubation Type: IV induction Ventilation: Mask ventilation without difficulty and Oral airway inserted - appropriate to patient size Laryngoscope Size: Mac and 3 Grade View: Grade III Tube type: Oral Tube size: 7.5 mm Number of attempts: 1 Airway Equipment and Method: Stylet and LTA kit utilized Placement Confirmation: ETT inserted through vocal cords under direct vision,  positive ETCO2 and breath sounds checked- equal and bilateral Secured at: 22 cm Tube secured with: Tape Dental Injury: Teeth and Oropharynx as per pre-operative assessment

## 2013-05-09 NOTE — Transfer of Care (Signed)
Immediate Anesthesia Transfer of Care Note  Patient: [name] Johnston  Procedure(s) Performed: Procedure(s): BURR HOLES (Left)  Patient Location: PACU  Anesthesia Type:General  Level of Consciousness: responds to stimulation  Airway & Oxygen Therapy: Patient Spontanous Breathing and Patient connected to nasal cannula oxygen  Post-op Assessment: Report given to PACU RN and Post -op Vital signs reviewed and stable  Post vital signs: Reviewed and stable  Complications: No apparent anesthesia complications
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
## 2013-05-09 NOTE — Anesthesia Postprocedure Evaluation (Signed)
  Anesthesia Post-op Note  Patient: [name] Johnston  Procedure(s) Performed: Procedure(s): BURR HOLES (Left)  Patient Location: PACU  Anesthesia Type:General  Level of Consciousness: awake  Airway and Oxygen Therapy: Patient Spontanous Breathing  Post-op Pain: mild  Post-op Assessment: Post-op Vital signs reviewed  Post-op Vital Signs: stable  Complications: No apparent anesthesia complications

## 2013-05-09 NOTE — Op Note (Signed)
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  Postoperative diagnosis: Same  Procedure Name: Left frontal burr hole evacuation of chronic subdural hematoma  Surgeon:Melea Prezioso A.Senya Hinzman, M.D.  Asst. Surgeon: None  Anesthesia: General  Indication: 62 year old male with headaches and unsteadiness. Workup demonstrates evidence of bifrontal fluid collections left significant greater than right with mass effect upon the left frontal lobe. Fluid either consistent with chronic subdural hygroma versus subdural hematoma. Patient presents now for drainage.  Operative note: After induction anesthesia, patient positioned supine. Patient's forehead and left orbit were prepped and draped sterilely. Incision made just overlying the left brow. This carried down sharply to the paracranium. Self-retaining retractor placed. A single bur hole was then made into the left frontal skull. Dura was coagulated and incised. Fluid was released under pressure. This was straw-colored and consistent mostly with a chronic subdural hematoma although there were some later elements of fluid which raise the possibility of subdural hygroma. The foot of the rod to drain. The brain reexpanded nicely. There is no evidence of any significant bleeding. The space was irrigated clear. Gelfoam was placed over the bur hole site and a burr hole cover was placed over the bur hole defect. This was secured in place with 2 screws and the scalp was then reapproximated with 2-0 Vicryl suture the galea and 3-0 Vicryl sutures at the surface. Dermabond was placed over the wound. There were no apparent complications. Patient returns recovery postop.

## 2013-05-09 NOTE — Anesthesia Preprocedure Evaluation (Signed)
Anesthesia Evaluation  Patient identified by MRN, date of birth, ID band Patient awake    Reviewed: Allergy & Precautions, H&P , NPO status , Patient's Chart, lab work & pertinent test results  Airway       Dental  (+) Teeth Intact   Pulmonary shortness of breath, asthma ,  breath sounds clear to auscultation        Cardiovascular negative cardio ROS  Rhythm:Regular Rate:Normal     Neuro/Psych  Headaches,    GI/Hepatic negative GI ROS, Neg liver ROS,   Endo/Other  negative endocrine ROS  Renal/GU negative Renal ROS     Musculoskeletal   Abdominal   Peds  Hematology negative hematology ROS (+)   Anesthesia Other Findings   Reproductive/Obstetrics                           Anesthesia Physical Anesthesia Plan  ASA: II  Anesthesia Plan: General   Post-op Pain Management:    Induction: Intravenous  Airway Management Planned: Oral ETT  Additional Equipment:   Intra-op Plan:   Post-operative Plan: Extubation in OR  Informed Consent: I have reviewed the patients History and Physical, chart, labs and discussed the procedure including the risks, benefits and alternatives for the proposed anesthesia with the patient or authorized representative who has indicated his/her understanding and acceptance.   Dental advisory given  Plan Discussed with: CRNA and Surgeon  Anesthesia Plan Comments:         Anesthesia Quick Evaluation

## 2013-05-10 MED ORDER — PANTOPRAZOLE SODIUM 40 MG PO TBEC: 40.0000 mg | DELAYED_RELEASE_TABLET | Freq: Every day | ORAL | Status: DC

## 2013-05-10 NOTE — Progress Notes (Signed)
Pt. Discharged at 1430.  [facility] notified.  Discharge papers given to pt. and explained to family.  Pt. was taken out by volunteer services.  Pt. And family had no further questions at this time and was told to call Dr. Lindalou Hose office to follow up and for further questions.

## 2013-05-10 NOTE — Discharge Summary (Signed)
Physician Discharge Summary  Patient ID: [name] Johnston MRN: [id number] DOB/AGE: 63/07/1951 63 y.o.  Admit date: 05/09/2013 Discharge date: 05/10/2013  Admission Diagnoses:  Discharge Diagnoses:  Principal Problem:   Subdural hematoma   Discharged Condition: good  Hospital Course:   Consults:   Significant Diagnostic Studies:   Treatments:   Discharge Exam: Blood pressure 128/72, pulse 83, temperature 98.1 F (36.7 C), temperature source Oral, resp. rate 20, height 5\' 3"  (1.6 m), weight 64.6 kg (142 lb 6.7 oz), SpO2 96.00%. A/A ox4 fluent motor 5/5 sens intact  Wd healing well  Disposition: Final discharge disposition not confirmed     Medication List    TAKE these medications       albuterol 108 (90 BASE) MCG/ACT inhaler  Commonly known as:  PROVENTIL HFA;VENTOLIN HFA  Inhale 1-2 puffs into the lungs every 4 (four) hours as needed for wheezing.     silodosin 8 MG Caps capsule  Commonly known as:  RAPAFLO  Take 8 mg by mouth daily with breakfast.     traMADol 50 MG tablet  Commonly known as:  ULTRAM  Take 50-100 mg by mouth every 6 (six) hours as needed for pain.     triamcinolone cream 0.1 %  Commonly known as:  KENALOG  Apply 1 application topically 2 (two) times daily.           Follow-up Information   Follow up with Lem Peary A, MD. Schedule an appointment as soon as possible for a visit in 1 week.   Contact information:   [address] [place] [address] [phone]       Signed: Julio Sicks A 05/10/2013, 1:21 PM

## 2013-05-10 NOTE — Progress Notes (Signed)
Foley removed at 0900.  Pt. attempted to use BR.  OOB with 1 assist with no problems.  Was unable to void at this time.  Will continue to monitor.

## 2013-05-10 NOTE — Progress Notes (Signed)
Postop day 1. Complains of mild headache no other problems. Inability to void postoperatively. Patient with known urinary retention difficulty.  Afebrile. Vital stable. Awake and alert. Oriented appropriate. Speech fluent. Motor and sensory intact. Wound clean and dry. Chest and abdomen benign.  Stable status post burr hole evacuation of subdural fluid collection. Mobilize today. Remove catheter for morning trial. Possible discharge home later today if able to void.

## 2013-05-11 ENCOUNTER — Encounter (HOSPITAL_COMMUNITY): Payer: Self-pay | Admitting: Neurosurgery

## 2013-05-29 ENCOUNTER — Other Ambulatory Visit: Payer: Self-pay | Admitting: Urology

## 2013-05-29 ENCOUNTER — Encounter (HOSPITAL_BASED_OUTPATIENT_CLINIC_OR_DEPARTMENT_OTHER): Payer: Self-pay | Admitting: *Deleted

## 2013-05-29 NOTE — Progress Notes (Addendum)
LM FOR CHASITY, OR SCHEDULER FOR DR Isabel Caprice. PT NEEDS CLEARANCE FROM DR POOLE, NEURO SURGEON, DUE TO S/P LEFT BURR HOLE WITH EVACUATION SUBDURAL HEMATOMA 05-09-2013.  LM FOR CHASITY ABOUT STATUS OF PT'S CLEARANCE. WAITING FOR RESPONSE.  RECEIVED CALL FROM CHASITY .  CHASITY FOUND OUT TODAY THAT DR POOLE HAS BEEN OFF ALL WEEK,  DR Yetta Barre FROM THAT OFFICE ([facility]) WAS ASKED TO GIVE CLEARANCE, DR Yetta Barre DECLINED.  CHASITY STATES SHE WAS GIVEN DR POOLE'S # AND SHE SPOKE W/ DR Isabel Caprice . DR Isabel Caprice STATES HE WILL CALL DR POOLE FIRST THINK Monday MORNING TO GET VERBAL CLEARANCE IF POSSIBLE.    SPOKE W/ DR [facility] MDA REVIEWED W/ HIM ABOUT PT S/P BURR HOLE WITH HEMATOMA EVACUATION ON 05-09-2013 AND PT IS SCHEDULED FOR TURBT/ TURP FOR DR GRAPEY ON Monday.  QUESTION WAS ASKED ABOUT CLEARANCE FROM NEURO SURGEON, WHICH PT DOES NOT. DR POOLE OUT OF OFFICE AND DR Yetta Barre FROM THAT OFFICE WOULD NOT GIVE CLEARANCE.  DR [facility] STATES WAIT UNTIL Monday MORNING TO SEE IF DR GRAPEY GETS VERBAL CLEARANCE FROM DR POOLE.

## 2013-06-05 ENCOUNTER — Ambulatory Visit (HOSPITAL_BASED_OUTPATIENT_CLINIC_OR_DEPARTMENT_OTHER)
Admission: RE | Admit: 2013-06-05 | Discharge: 2013-06-06 | Disposition: A | Discharge status: Home / Self Care | Payer: PRIVATE HEALTH INSURANCE | Source: Ambulatory Visit | Attending: Urology | Admitting: Urology

## 2013-06-05 ENCOUNTER — Encounter (HOSPITAL_BASED_OUTPATIENT_CLINIC_OR_DEPARTMENT_OTHER): Payer: Self-pay | Admitting: Anesthesiology

## 2013-06-05 ENCOUNTER — Encounter (HOSPITAL_BASED_OUTPATIENT_CLINIC_OR_DEPARTMENT_OTHER)
Admission: RE | Disposition: A | Discharge status: Home / Self Care | Payer: Self-pay | Source: Ambulatory Visit | Attending: Urology

## 2013-06-05 ENCOUNTER — Ambulatory Visit (HOSPITAL_BASED_OUTPATIENT_CLINIC_OR_DEPARTMENT_OTHER): Payer: PRIVATE HEALTH INSURANCE | Admitting: Anesthesiology

## 2013-06-05 DIAGNOSIS — J45909 Unspecified asthma, uncomplicated: Secondary | ICD-10-CM | POA: Insufficient documentation

## 2013-06-05 DIAGNOSIS — I6200 Nontraumatic subdural hemorrhage, unspecified: Secondary | ICD-10-CM | POA: Insufficient documentation

## 2013-06-05 DIAGNOSIS — C61 Malignant neoplasm of prostate: Secondary | ICD-10-CM | POA: Insufficient documentation

## 2013-06-05 DIAGNOSIS — R42 Dizziness and giddiness: Secondary | ICD-10-CM | POA: Insufficient documentation

## 2013-06-05 DIAGNOSIS — K5900 Constipation, unspecified: Secondary | ICD-10-CM | POA: Insufficient documentation

## 2013-06-05 DIAGNOSIS — R110 Nausea: Secondary | ICD-10-CM | POA: Insufficient documentation

## 2013-06-05 DIAGNOSIS — Z79899 Other long term (current) drug therapy: Secondary | ICD-10-CM | POA: Insufficient documentation

## 2013-06-05 HISTORY — PX: TRANSURETHRAL RESECTION OF PROSTATE: SHX73

## 2013-06-05 HISTORY — PX: TRANSURETHRAL RESECTION OF BLADDER TUMOR: SHX2575

## 2013-06-05 HISTORY — DX: Nontraumatic chronic subdural hemorrhage: I62.03

## 2013-06-05 SURGERY — TURBT (TRANSURETHRAL RESECTION OF BLADDER TUMOR)
Anesthesia: General | Site: Bladder | Wound class: Clean Contaminated

## 2013-06-05 MED ORDER — STERILE WATER FOR IRRIGATION IR SOLN
Status: DC | PRN
  Administered 2013-06-05: 6000 mL

## 2013-06-05 MED ORDER — LIDOCAINE HCL 2 % EX GEL
CUTANEOUS | Status: DC | PRN
  Administered 2013-06-05: 1

## 2013-06-05 MED ORDER — PROPOFOL 10 MG/ML IV BOLUS
INTRAVENOUS | Status: DC | PRN
  Administered 2013-06-05: 150 mg via INTRAVENOUS

## 2013-06-05 MED ORDER — HYDROCODONE-ACETAMINOPHEN 5-325 MG PO TABS
1.0000 | ORAL_TABLET | ORAL | Status: DC | PRN
  Administered 2013-06-05 – 2013-06-06 (×4): 1 via ORAL
  Filled 2013-06-05: qty 2

## 2013-06-05 MED ORDER — DEXTROSE 5 % IV SOLN
1.0000 g | INTRAVENOUS | Status: DC
  Administered 2013-06-05: 1 g via INTRAVENOUS
  Filled 2013-06-05: qty 10

## 2013-06-05 MED ORDER — DEXAMETHASONE SODIUM PHOSPHATE 4 MG/ML IJ SOLN
INTRAMUSCULAR | Status: DC | PRN
  Administered 2013-06-05: 8 mg via INTRAVENOUS

## 2013-06-05 MED ORDER — KCL IN DEXTROSE-NACL 20-5-0.45 MEQ/L-%-% IV SOLN
INTRAVENOUS | Status: DC
  Administered 2013-06-05 – 2013-06-06 (×2): via INTRAVENOUS
  Filled 2013-06-05: qty 1000

## 2013-06-05 MED ORDER — OXYBUTYNIN CHLORIDE 5 MG PO TABS
5.0000 mg | ORAL_TABLET | Freq: Three times a day (TID) | ORAL | Status: DC | PRN
  Filled 2013-06-05: qty 1

## 2013-06-05 MED ORDER — ONDANSETRON HCL 4 MG/2ML IJ SOLN
4.0000 mg | INTRAMUSCULAR | Status: DC | PRN
  Filled 2013-06-05: qty 2

## 2013-06-05 MED ORDER — LIDOCAINE HCL (CARDIAC) 20 MG/ML IV SOLN
INTRAVENOUS | Status: DC | PRN
  Administered 2013-06-05: 50 mg via INTRAVENOUS

## 2013-06-05 MED ORDER — ROCURONIUM BROMIDE 100 MG/10ML IV SOLN
INTRAVENOUS | Status: DC | PRN
  Administered 2013-06-05: 30 mg via INTRAVENOUS
  Administered 2013-06-05: 5 mg via INTRAVENOUS

## 2013-06-05 MED ORDER — NEOSTIGMINE METHYLSULFATE 1 MG/ML IJ SOLN
INTRAMUSCULAR | Status: DC | PRN
  Administered 2013-06-05: 3 mg via INTRAVENOUS

## 2013-06-05 MED ORDER — LIDOCAINE HCL 4 % MT SOLN
OROMUCOSAL | Status: DC | PRN
  Administered 2013-06-05: 4 mL via TOPICAL

## 2013-06-05 MED ORDER — GLYCOPYRROLATE 0.2 MG/ML IJ SOLN
INTRAMUSCULAR | Status: DC | PRN
  Administered 2013-06-05: 0.2 mg via INTRAVENOUS

## 2013-06-05 MED ORDER — ALBUTEROL SULFATE HFA 108 (90 BASE) MCG/ACT IN AERS
1.0000 | INHALATION_SPRAY | RESPIRATORY_TRACT | Status: DC | PRN
  Filled 2013-06-05: qty 6.7

## 2013-06-05 MED ORDER — INDIGOTINDISULFONATE SODIUM 8 MG/ML IJ SOLN
INTRAMUSCULAR | Status: DC | PRN
  Administered 2013-06-05: 5 mL via INTRAVENOUS

## 2013-06-05 MED ORDER — FENTANYL CITRATE 0.05 MG/ML IJ SOLN
25.0000 ug | INTRAMUSCULAR | Status: DC | PRN
  Administered 2013-06-05 (×5): 25 ug via INTRAVENOUS
  Filled 2013-06-05: qty 1

## 2013-06-05 MED ORDER — EPHEDRINE SULFATE 50 MG/ML IJ SOLN
INTRAMUSCULAR | Status: DC | PRN
  Administered 2013-06-05: 10 mg via INTRAVENOUS

## 2013-06-05 MED ORDER — FENTANYL CITRATE 0.05 MG/ML IJ SOLN
INTRAMUSCULAR | Status: DC | PRN
  Administered 2013-06-05: 50 ug via INTRAVENOUS
  Administered 2013-06-05 (×2): 25 ug via INTRAVENOUS

## 2013-06-05 MED ORDER — SODIUM CHLORIDE 0.9 % IR SOLN
Status: DC | PRN
  Administered 2013-06-05: 9000 mL

## 2013-06-05 MED ORDER — CIPROFLOXACIN IN D5W 400 MG/200ML IV SOLN
400.0000 mg | INTRAVENOUS | Status: AC
  Administered 2013-06-05: 400 mg via INTRAVENOUS
  Filled 2013-06-05: qty 200

## 2013-06-05 MED ORDER — ONDANSETRON HCL 4 MG/2ML IJ SOLN
INTRAMUSCULAR | Status: DC | PRN
  Administered 2013-06-05: 4 mg via INTRAVENOUS

## 2013-06-05 MED ORDER — BELLADONNA ALKALOIDS-OPIUM 16.2-60 MG RE SUPP
RECTAL | Status: DC | PRN
  Administered 2013-06-05: 1 via RECTAL

## 2013-06-05 MED ORDER — LACTATED RINGERS IV SOLN
INTRAVENOUS | Status: DC
  Administered 2013-06-05 (×2): via INTRAVENOUS
  Filled 2013-06-05: qty 1000

## 2013-06-05 MED ORDER — LACTATED RINGERS IV SOLN
INTRAVENOUS | Status: DC
  Filled 2013-06-05: qty 1000

## 2013-06-05 MED ORDER — PROMETHAZINE HCL 25 MG/ML IJ SOLN
6.2500 mg | INTRAMUSCULAR | Status: DC | PRN
  Filled 2013-06-05: qty 1

## 2013-06-05 MED ORDER — MORPHINE SULFATE 2 MG/ML IJ SOLN
2.0000 mg | INTRAMUSCULAR | Status: DC | PRN
  Filled 2013-06-05: qty 2

## 2013-06-05 SURGICAL SUPPLY — 32 items
BAG DRAIN URO-CYSTO SKYTR STRL (DRAIN) ×3 IMPLANT
BAG URINE DRAINAGE (UROLOGICAL SUPPLIES) ×3 IMPLANT
BAG URINE LEG 19OZ MD ST LTX (BAG) IMPLANT
CANISTER SUCT LVC 12 LTR MEDI- (MISCELLANEOUS) ×3 IMPLANT
CATH FOLEY 2WAY SLVR  5CC 20FR (CATHETERS)
CATH FOLEY 2WAY SLVR  5CC 22FR (CATHETERS)
CATH FOLEY 2WAY SLVR 5CC 20FR (CATHETERS) IMPLANT
CATH FOLEY 2WAY SLVR 5CC 22FR (CATHETERS) IMPLANT
CATH FOLEY 3WAY 30CC 22F (CATHETERS) ×3 IMPLANT
CLOTH BEACON ORANGE TIMEOUT ST (SAFETY) ×3 IMPLANT
DRAPE CAMERA CLOSED 9X96 (DRAPES) ×3 IMPLANT
ELECT BUTTON BIOP 24F 90D PLAS (MISCELLANEOUS) ×3 IMPLANT
ELECT BUTTON HF 24-28F 2 30DE (ELECTRODE) ×3 IMPLANT
ELECT LOOP HF 26F 30D .35MM (CUTTING LOOP) IMPLANT
ELECT NEEDLE 45D HF 24-28F 12D (CUTTING LOOP) IMPLANT
ELECT REM PT RETURN 9FT ADLT (ELECTROSURGICAL) ×3
ELECTRODE REM PT RTRN 9FT ADLT (ELECTROSURGICAL) ×2 IMPLANT
EVACUATOR MICROVAS BLADDER (UROLOGICAL SUPPLIES) ×3 IMPLANT
GLOVE BIO SURGEON STRL SZ 6.5 (GLOVE) ×3 IMPLANT
GLOVE BIO SURGEON STRL SZ7.5 (GLOVE) ×3 IMPLANT
GLOVE INDICATOR 7.0 STRL GRN (GLOVE) ×3 IMPLANT
GOWN STRL NON-REIN LRG LVL3 (GOWN DISPOSABLE) ×3 IMPLANT
GOWN STRL REIN XL XLG (GOWN DISPOSABLE) ×3 IMPLANT
GOWN XL W/COTTON TOWEL STD (GOWNS) IMPLANT
HOLDER FOLEY CATH W/STRAP (MISCELLANEOUS) ×3 IMPLANT
KIT ASPIRATION TUBING (SET/KITS/TRAYS/PACK) IMPLANT
LOOP CUTTING 24FR OLYMPUS (CUTTING LOOP) IMPLANT
LOOP ELECTRODE 28FR (MISCELLANEOUS) IMPLANT
PACK CYSTOSCOPY (CUSTOM PROCEDURE TRAY) ×3 IMPLANT
PLUG CATH AND CAP STER (CATHETERS) ×3 IMPLANT
SET ASPIRATION TUBING (TUBING) IMPLANT
SYRINGE IRR TOOMEY STRL 70CC (SYRINGE) IMPLANT

## 2013-06-05 NOTE — Transfer of Care (Signed)
Immediate Anesthesia Transfer of Care Note  Patient: [name] Johnston  Procedure(s) Performed: Procedure(s) (LRB): TRANSURETHRAL RESECTION OF BLADDER TUMOR (TURBT) (N/A) TRANSURETHRAL RESECTION OF THE PROSTATE (TURP) (N/A)  Patient Location: PACU  Anesthesia Type: General  Level of Consciousness: awake, alert  and oriented  Airway & Oxygen Therapy: Patient Spontanous Breathing and Patient connected to face mask oxygen  Post-op Assessment: Report given to PACU RN and Post -op Vital signs reviewed and stable  Post vital signs: Reviewed and stable  Complications: No apparent anesthesia complications

## 2013-06-05 NOTE — H&P (Signed)
Reason For Visit  Consultation request from Dr. Warner Mccreedy for further assessment of an elevated PSA, microhematuria and voiding symptoms.   History of Present Illness  Mr. Patty is a 62 year old [place] male. He does not speak [place]. He did develop some increased voiding symptoms of frequency and was noted to have microhematuria without definitive evidence of cystitis or prostatitis. PSA recently drawn was 28.5.    The history is somewhat difficult due to the fact that he does not speak [place] and he does present today with his daughter and son, who certainly help with the translation. It certainly appears that these symptoms started abruptly rather than a slow onset. He has had at least a month now of primarily nocturia of up to 10 times per evening along with significant frequency, but also obstructive symptoms as well. Urinalysis today really shows nothing of concern and it appears to be clear. The patient's postvoid residual is negligible. Complicating factors include the fact that he has had a severe headache with nausea recently and apparently has been diagnosed with what sounds like a subdermal hematoma and he is apparently scheduled/tentatively scheduled for a neurosurgical decompressive procedure tomorrow.      Past Medical History Problems  1. History of  Asthma 493.90  Surgical History Problems  1. History of  No Surgical Problems  Current Meds 1. Albuterol Sulfate SOLR; Therapy: (Recorded:19May2014) to 2. Cephalexin 500 MG Oral Capsule; Therapy: (Recorded:19May2014) to 3. Meclizine HCl TABS; Therapy: (Recorded:19May2014) to 4. Proventil HFA 108 (90 Base) MCG/ACT Inhalation Aerosol Solution; Therapy:  (Recorded:19May2014) to 5. TraMADol HCl 50 MG Oral Tablet; Therapy: (Recorded:19May2014) to  Allergies Medication  1. Aspirin Low Dose TABS  Family History Problems  1. Paternal history of  Family Health Status - Father's Age 494. Maternal history of  Family  Health Status - Mother's Age 49. Family history of  Family Health Status Number Of Children 4. Family history of  No Significant Family History  Social History Problems    Caffeine Use   Never A Smoker   Occupation: Denied    History of  Alcohol Use   History of  Tobacco Use  Review of Systems Genitourinary, constitutional, skin, eye, otolaryngeal, hematologic/lymphatic, cardiovascular, pulmonary, endocrine, musculoskeletal, gastrointestinal, neurological and psychiatric system(s) were reviewed and pertinent findings if present are noted.  Genitourinary: urinary frequency, feelings of urinary urgency, dysuria, nocturia, difficulty starting the urinary stream, weak urinary stream, urinary stream starts and stops and initiating urination requires straining, but no hematuria.  Gastrointestinal: nausea, vomiting and constipation.  Constitutional: feeling tired (fatigue).  Integumentary: skin rash/lesion.  Neurological: headache and dizziness.    Vitals Vital Signs [Data Includes: Last 1 Day]  19May2014 03:03PM  BMI Calculated: 26.06 BSA Calculated: 1.61 Height: 5 ft 1 in Weight: 138 lb  Blood Pressure: 138 / 91 Temperature: 97.9 F Heart Rate: 74  Physical Exam Constitutional: Well nourished and well developed . No acute distress.  ENT:. The ears and nose are normal in appearance.  Neck: The appearance of the neck is normal and no neck mass is present.  Pulmonary: No respiratory distress and normal respiratory rhythm and effort.  Cardiovascular: Heart rate and rhythm are normal . No peripheral edema.  Abdomen: The abdomen is soft and nontender. No masses are palpated. No CVA tenderness. No hernias are palpable. No hepatosplenomegaly noted.  Rectal: Rectal exam demonstrates normal sphincter tone, no tenderness and no masses. The prostate has no nodularity and is not tender. The left seminal vesicle is nonpalpable. The  right seminal vesicle is nonpalpable. The perineum is normal  on inspection.  Genitourinary: Examination of the penis demonstrates no discharge, no masses, no lesions and a normal meatus. The penis is uncircumcised. The scrotum is without lesions. The right epididymis is palpably normal and non-tender. The left epididymis is palpably normal and non-tender. The right testis is non-tender and without masses. The left testis is non-tender and without masses. Cycst on foreskin.  Lymphatics: The femoral and inguinal nodes are not enlarged or tender.  Skin: Normal skin turgor, no visible rash and no visible skin lesions.  Neuro/Psych:. Mood and affect are appropriate.    Results/Data  PVR: Ultrasound PVR 6 ml.    Assessment Assessed  1. Urinary Frequency 788.41 2. PSA,Elevated 790.93 3. Microscopic Hematuria 599.72  Plan PSA,Elevated (790.93)  1. PSA  Requested for: 19May2014 2. Follow-up Weeks 3-4 Office  Follow-up  Requested for: 19May2014 Urinary Frequency (788.41), Microscopic Hematuria (599.72)  3. Cysto  Requested for: 19May2014  Discussion/Summary   At this point, Mr. Cress has had apparently had 1 month of sudden onset of fairly severe obstructive and irritative voiding symptoms. The etiology remains somewhat obscure. Typically, this is what we would see with an acute episode of prostatitis, but based on initial urinalysis as well as followup urinalysis today, there is certainly no evidence of any obvious urinary tract inflammation/infection. We obviously need to be very concerned about his substantially elevated PSA and certainly this needs to be repeated today and if it remains persistently elevated at this level, we will need to arrange for an ultrasound and biopsy. Complicating matters include this apparently subdural hematoma. I will attempt to reach his neurosurgeon today, Dr. Dutch Quint, to discuss his situation. I can see no reason why his current urologic symptoms would preclude his need to go ahead with the neurosurgical procedure, which  certainly sounds as though it is quite important given his current symptom complex. I am going to empirically start him on an alpha-blocker with Rapaflo samples to see if we can improve his overall voiding. If his PSA normalizes and his voiding improves on medication, then it is possible that nothing else will be necessary. I suspect we will end up performing cystoscopy, urodynamics, and potentially prostate biopsy on him to get to the bottom of his current clinical complex.     Addendum after office cystoscopy:   Mr. Szymborski clearly has a significant problem. He has some marked abnormalities on cystoscopy which are concerning for either a poorly differentiated locally advanced prostate cancer extending onto the trigone or transitional cell carcinoma. The patient clearly needs a cystoscopic assessment under anesthesia with resection and biopsies. Identification of the left ureteral orifice may be very difficult. Overall renal function is normal. I would like to start with a CT scan of the abdomen and pelvis. Depending on the findings at the time of cystoscopy and resection we will also potentially have to consider a prostate ultrasound with biopsy if the bladder abnormality turns out to be transitional cell carcinoma. I will like to start his assessment with CT imaging get him on the schedule for a cystoscopic reevaluation with resection. This is explained to the patient's family since he speaks no [place].

## 2013-06-05 NOTE — Op Note (Signed)
Preoperative diagnosis: Hematuria, severe voiding symptoms, elevated PSA with probable locally advanced prostate cancer rule out transitional cell carcinoma Postoperative diagnosis: Same  Procedure: Cystoscopy, TURP   Surgeon: Valetta Fuller M.D.  Anesthesia: Gen.  Indications: Patient is 62 years of age. He is not speaking list and therefore we have had to require translation. He has had some long-standing severe obstructive and irritative voiding symptoms which have progressively worsened. He has a substantially elevated PSA in the 30-40 range. He has been documented to have some hematuria. Recent CT imaging revealed evidence of an enlarged prostate but no evidence of cancer outside the genitourinary system. Cystoscopy revealed what appeared to be a fixed bladder neck with a markedly abnormal appearing trigone consistent with a locally advanced prostate cancer although transitional cell carcinoma could not be ruled out. He is here today for cystoscopic assessment under anesthesia along with biopsy/resection.     Technique and findings: Patient was brought the operating room where he had successful induction general anesthesia. Placed in lithotomy position and prepped and draped in usual manner. Appropriate surgical timeout was performed. The patient received perioperative antibiotics and PAS compression boots. Cystoscopy again revealed a markedly abnormal prostatic fossa and bladder neck. There was marked fixation consistent with locally advanced prostate carcinoma. There was extension of a large tumor growth into the trigone of the bladder. Despite indigo Carmine both ureteral orifices were not visible. The appearance was that of a locally advanced prostate cancer extending out into the trigone of the bladder. There was clear evidence of visual obstruction and oozing from friable vessels.   After urethral dilation we attempted on multiple occasions to place a 28 [place] continuous flow resectoscope  sheath but his urethra would not accommodate that. For that reason we had to use a 26 French sheath but did not allow for continuous bladder irrigation. The bladder neck area was resected. We were never able to identify the ureteral orifices. There was marked distortion of the normal anatomy with hypervascularity and substantial oozing. Bladder neck fixation did improve as resected this area. We were primarily attempting to obtain tissue for pathologic diagnosis/confirmation but also wanted to eliminate will appear to be really quite substantial visual obstruction. After resection there was moderate diffuse oozing from the bladder neck. A 22 French continuous flow Foley catheter was inserted with light pink urine at the completion of the procedure. The patient was brought to recovery in stable condition.

## 2013-06-05 NOTE — Anesthesia Procedure Notes (Signed)
Procedure Name: Intubation Date/Time: 06/05/2013 9:52 AM Performed by: Norva Pavlov Pre-anesthesia Checklist: Patient identified, Emergency Drugs available, Suction available and Patient being monitored Patient Re-evaluated:Patient Re-evaluated prior to inductionOxygen Delivery Method: Circle System Utilized Preoxygenation: Pre-oxygenation with 100% oxygen Intubation Type: IV induction Ventilation: Mask ventilation without difficulty Laryngoscope Size: Mac and 3 Tube type: Oral Tube size: 7.0 mm Number of attempts: 1 Airway Equipment and Method: stylet,  oral airway and LTA kit utilized Placement Confirmation: ETT inserted through vocal cords under direct vision,  positive ETCO2 and breath sounds checked- equal and bilateral Secured at: 21 cm Tube secured with: Tape Dental Injury: Teeth and Oropharynx as per pre-operative assessment

## 2013-06-05 NOTE — Anesthesia Postprocedure Evaluation (Signed)
Anesthesia Post Note  Patient: [name] Johnston  Procedure(s) Performed: Procedure(s) (LRB): TRANSURETHRAL RESECTION OF BLADDER TUMOR (TURBT) (N/A) TRANSURETHRAL RESECTION OF THE PROSTATE (TURP) (N/A)  Anesthesia type: General  Patient location: PACU  Post pain: Pain level controlled  Post assessment: Post-op Vital signs reviewed  Last Vitals:  Filed Vitals:   06/05/13 1103  BP: 173/102  Pulse:   Temp: 36.3 C  Resp: 8    Post vital signs: Reviewed  Level of consciousness: sedated  Complications: No apparent anesthesia complications

## 2013-06-05 NOTE — Anesthesia Preprocedure Evaluation (Addendum)
Anesthesia Evaluation  Patient identified by MRN, date of birth, ID band Patient awake    Reviewed: Allergy & Precautions, H&P , NPO status , Patient's Chart, lab work & pertinent test results  Airway Mallampati: II TM Distance: >3 FB Neck ROM: Full    Dental  (+) Poor Dentition   Pulmonary shortness of breath, asthma ,  breath sounds clear to auscultation        Cardiovascular negative cardio ROS  Rhythm:Regular Rate:Normal     Neuro/Psych  Headaches, Diagnosed with chronic subdural hematoma; S/P burr hole 05/08/2013 without complication.    GI/Hepatic negative GI ROS, Neg liver ROS,   Endo/Other  negative endocrine ROS  Renal/GU negative Renal ROS     Musculoskeletal   Abdominal   Peds  Hematology negative hematology ROS (+)   Anesthesia Other Findings Surgical clearance for today's procedure provided by Dr. Shirlean Mylar office.  Reproductive/Obstetrics                          Anesthesia Physical Anesthesia Plan  ASA: III  Anesthesia Plan: General   Post-op Pain Management:    Induction: Intravenous  Airway Management Planned: Oral ETT  Additional Equipment:   Intra-op Plan:   Post-operative Plan: Extubation in OR  Informed Consent: I have reviewed the patients History and Physical, chart, labs and discussed the procedure including the risks, benefits and alternatives for the proposed anesthesia with the patient or authorized representative who has indicated his/her understanding and acceptance.   Dental advisory given  Plan Discussed with: CRNA  Anesthesia Plan Comments:         Anesthesia Quick Evaluation

## 2013-06-06 ENCOUNTER — Encounter (HOSPITAL_BASED_OUTPATIENT_CLINIC_OR_DEPARTMENT_OTHER): Payer: Self-pay | Admitting: Urology

## 2013-06-06 LAB — CBC
HCT: 36.8 % — ABNORMAL LOW (ref 39.0–52.0)
MCHC: 33.7 g/dL (ref 30.0–36.0)
MCV: 78.1 fL (ref 78.0–100.0)
RDW: 12.9 % (ref 11.5–15.5)

## 2013-06-06 LAB — BASIC METABOLIC PANEL
BUN: 11 mg/dL (ref 6–23)
Chloride: 96 mEq/L (ref 96–112)
Creatinine, Ser: 0.86 mg/dL (ref 0.50–1.35)
GFR calc Af Amer: >90 mL/min (ref >90)
GFR calc non Af Amer: >90 mL/min (ref >90)

## 2013-06-06 MED ORDER — HYDROCODONE-ACETAMINOPHEN 5-325 MG PO TABS: 1.0000 | ORAL_TABLET | ORAL | Status: DC | PRN

## 2013-06-06 MED ORDER — CIPROFLOXACIN HCL 250 MG PO TABS: 250.0000 mg | ORAL_TABLET | Freq: Two times a day (BID) | ORAL | Status: DC

## 2013-06-06 NOTE — Discharge Summary (Signed)
Physician Discharge Summary  Patient ID: [name] Johnston MRN: [id number] DOB/AGE: May 09, 1951 62 y.o.  Admit date: 06/05/2013 Discharge date: 06/06/2013  Admission Diagnoses:  Discharge Diagnoses:  Active Problems:   * No active hospital problems. *   Discharged Condition: good  Hospital Course: Patient underwent a TUR resection of what appeared to be a tumor involving the prostatic fossa, bladder neck and trigone region. I suspect locally advanced prostate cancer with the possibility of poorly differentiated transitional cell carcinoma. He was kept overnight for observation. Urine was light pink. Foley catheter was removed on postoperative day 1. He had no new complaints or concerns in laboratory values were all acceptable.  Consults: None  Significant Diagnostic Studies: None  Treatments: surgery: TURP P./TUR bladder neck  Discharge Exam: Blood pressure 116/70, pulse 84, temperature 98.1 F (36.7 C), temperature source Oral, resp. rate 20, weight 61.236 kg (135 lb), SpO2 97.00%. Well-developed well-nourished male in  no acute distress. Respiratory effort normal. Abdomen soft and nontender. External genitalia within normal limits. Extremities no edema or tenderness.  Disposition: 01-Home or Self Care     Medication List    TAKE these medications       albuterol 108 (90 BASE) MCG/ACT inhaler  Commonly known as:  PROVENTIL HFA;VENTOLIN HFA  Inhale 1-2 puffs into the lungs every 4 (four) hours as needed for wheezing.     cephALEXin 500 MG capsule  Commonly known as:  KEFLEX  Take 1 capsule (500 mg total) by mouth 2 (two) times daily.     ciprofloxacin 250 MG tablet  Commonly known as:  CIPRO  Take 1 tablet (250 mg total) by mouth 2 (two) times daily.     HYDROcodone-acetaminophen 5-325 MG per tablet  Commonly known as:  NORCO/VICODIN  Take 1-2 tablets by mouth every 4 (four) hours as needed.     meclizine 25 MG tablet  Commonly known as:  ANTIVERT  Take 1 tablet (25 mg  total) by mouth 3 (three) times daily as needed for dizziness.     silodosin 8 MG Caps capsule  Commonly known as:  RAPAFLO  Take 8 mg by mouth daily with breakfast.     traMADol 50 MG tablet  Commonly known as:  ULTRAM  Take 1 tablet (50 mg total) by mouth every 8 (eight) hours as needed for pain.     traMADol 50 MG tablet  Commonly known as:  ULTRAM  Take 50-100 mg by mouth every 6 (six) hours as needed for pain.     triamcinolone cream 0.1 %  Commonly known as:  KENALOG  Apply topically 2 (two) times daily.     triamcinolone cream 0.1 %  Commonly known as:  KENALOG  Apply 1 application topically 2 (two) times daily.           Follow-up Information   Follow up On 06/19/2013. (3pm)       Signed: Tayo Maute S 06/06/2013, 7:46 AM

## 2013-06-06 NOTE — Progress Notes (Signed)
Family members informed that visiting hrs were over at 830 pm.  They can say hello, but only 1 person is allowed to stay w pt.  All verbalized their underastanding

## 2013-06-09 ENCOUNTER — Other Ambulatory Visit (HOSPITAL_COMMUNITY): Payer: Self-pay | Admitting: Urology

## 2013-06-09 DIAGNOSIS — C61 Malignant neoplasm of prostate: Secondary | ICD-10-CM

## 2013-06-13 ENCOUNTER — Ambulatory Visit (HOSPITAL_COMMUNITY): Payer: PRIVATE HEALTH INSURANCE

## 2013-06-13 ENCOUNTER — Other Ambulatory Visit (HOSPITAL_COMMUNITY): Payer: Self-pay | Admitting: Urology

## 2013-06-13 ENCOUNTER — Encounter (HOSPITAL_COMMUNITY): Payer: PRIVATE HEALTH INSURANCE

## 2013-06-13 DIAGNOSIS — C61 Malignant neoplasm of prostate: Secondary | ICD-10-CM

## 2013-06-21 ENCOUNTER — Encounter (HOSPITAL_COMMUNITY): Payer: Self-pay

## 2013-06-21 ENCOUNTER — Encounter (HOSPITAL_COMMUNITY)
Admission: RE | Admit: 2013-06-21 | Discharge: 2013-06-21 | Disposition: A | Discharge status: Home / Self Care | Payer: PRIVATE HEALTH INSURANCE | Source: Ambulatory Visit | Attending: Urology | Admitting: Urology

## 2013-06-21 DIAGNOSIS — C61 Malignant neoplasm of prostate: Secondary | ICD-10-CM

## 2013-06-21 MED ORDER — TECHNETIUM TC 99M MEDRONATE IV KIT
25.0000 | PACK | Freq: Once | INTRAVENOUS | Status: AC | PRN
  Administered 2013-06-21: 25 via INTRAVENOUS

## 2013-07-07 ENCOUNTER — Encounter: Payer: Self-pay | Admitting: Radiation Oncology

## 2013-07-07 DIAGNOSIS — C61 Malignant neoplasm of prostate: Secondary | ICD-10-CM

## 2013-07-07 NOTE — Progress Notes (Signed)
GU Location of Tumor / Histology: adenocarcinoma of the prostate extending into bladder neck and trigon region  If Prostate Cancer, Gleason Score is (4 + 4=8) and PSA is (37.66)  Patient referred by Dr. Dallas Schimke to Dr. Annabell Howells for further assessment of an elevated PSA, microhematuria and voiding symptoms on 05/08/2013  Biopsies of prostate (if applicable) revealed: FINAL DIAGNOSIS Diagnosis Prostate, chips, with bladder neck chips - PROSTATIC ADENOCARCINOMA, GLEASON'S SCORE 4 + 4 = 8.. Microscopic Comment ONCOLOGY TABLE - PROSTATE, TUR OR ENUCLEATION: 1. Procedure: Transurethral resection 2. Histologic type: Prostatic adenocarcinoma 3. Gleason's score: 4 + 4 = 8 4. Estimated proportion (%) of prostate tissue involved with tumor: 70 % 5. Perineural invasion: No 6. Presence of prostatic intraepithelial neoplasia: N/A 7. Periprostatic fat invasion: N/A 8. Seminal vesicle invasion: N/A 9. Comments The tissue fragments are extensively involved by prostatic adenocarcinoma confirmed by immunohistochemistry showing positivity with prostatic specific antigen (PSA), prostatic acid phosphatase (PSAP) and p53 and negativity with cytokeratin 7, cytokeratin 903 and cytokeratin 20.  Past/Anticipated interventions by urology, if any: Firmagon, hormonal therapy, for 16-24 months.  Past/Anticipated interventions by medical oncology, if any: TUR resection  Weight changes, if any: None noted  Bowel/Bladder complaints, if any: urinary frequency, hematuria, nocturia (voids 10 times per night), weak stream, difficulty emptying bladder  Nausea/Vomiting, if any: nausea  Pain issues, if any:  headache  SAFETY ISSUES:  Prior radiation? NO  Pacemaker/ICD? NO  Possible current pregnancy? NO  Is the patient on methotrexate? NO  Current Complaints / other details:  63 year old [place] male. Recently underwent burr hole decompression of subdermal hematoma. Does not speak english. Grapey recommends  IMRT. Bone scan done 06/21/2013 and negative for metastatic disease. Prostate volume:

## 2013-07-10 ENCOUNTER — Ambulatory Visit
Admission: RE | Admit: 2013-07-10 | Discharge: 2013-07-10 | Disposition: A | Discharge status: Home / Self Care | Payer: PRIVATE HEALTH INSURANCE | Source: Ambulatory Visit | Attending: Radiation Oncology | Admitting: Radiation Oncology

## 2013-07-10 ENCOUNTER — Encounter: Payer: Self-pay | Admitting: Radiation Oncology

## 2013-07-10 VITALS — BP 151/84 | HR 79 | Temp 98.4°F | Resp 18 | Ht 63.0 in | Wt 141.8 lb

## 2013-07-10 DIAGNOSIS — C61 Malignant neoplasm of prostate: Secondary | ICD-10-CM | POA: Insufficient documentation

## 2013-07-10 DIAGNOSIS — S065X9A Traumatic subdural hemorrhage with loss of consciousness of unspecified duration, initial encounter: Secondary | ICD-10-CM

## 2013-07-10 DIAGNOSIS — M7989 Other specified soft tissue disorders: Secondary | ICD-10-CM

## 2013-07-10 MED ORDER — ALUM & MAG HYDROXIDE-SIMETH 500-450-40 MG/5ML PO SUSP: 10.0000 mL | Freq: Four times a day (QID) | ORAL | Status: DC | PRN

## 2013-07-10 NOTE — Progress Notes (Signed)
IPSS of 32 noted. Denies having or desiring any sexual activity in three months "because he has cancer." Unable to determine if no sexual activity was related to fear or function. Reports he voids every 10-15 minutes during the day and at least ten times during the night. Reports hematuria one week ago following biopsy that has since resolved. Reports burning with urination. Reports weak urine stream and difficulty emptying his bladder. Reports constipation and reflux. Reports headaches have improved since decompression but, present when he bends over or lays down. Denies ringing in the ears. Reports he is HOH. Reports fever and night sweats one month ago but, denies any since. Weight stable. Confirms he has a great appetite. Taking vicodin only occasionally to aid in sleep. Denies nausea or vomiting. Concerned about abdominal pain. Dr. Isabel Caprice reports that he did not obtain a prostate volume.

## 2013-07-10 NOTE — Progress Notes (Signed)
Complete PATIENT MEASURE OF DISTRESS worksheet with a score of 5 submitted to social work.  

## 2013-07-10 NOTE — Progress Notes (Signed)
Met with patient and interpreter this afternoon.  Patient had some concerns about costs.  I tried to contact [employer] benefits [phone]) automated system stated they are open 8-5.  Unable to get information today.
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
## 2013-07-10 NOTE — Progress Notes (Signed)
See progress note under physician encounter. 

## 2013-07-11 ENCOUNTER — Encounter: Payer: Self-pay | Admitting: Radiation Oncology

## 2013-07-11 ENCOUNTER — Ambulatory Visit (HOSPITAL_COMMUNITY)
Admission: RE | Admit: 2013-07-11 | Discharge: 2013-07-11 | Disposition: A | Discharge status: Home / Self Care | Payer: PRIVATE HEALTH INSURANCE | Source: Ambulatory Visit | Attending: Radiation Oncology | Admitting: Radiation Oncology

## 2013-07-11 DIAGNOSIS — C61 Malignant neoplasm of prostate: Secondary | ICD-10-CM

## 2013-07-11 DIAGNOSIS — M7989 Other specified soft tissue disorders: Secondary | ICD-10-CM

## 2013-07-11 DIAGNOSIS — S065XAA Traumatic subdural hemorrhage with loss of consciousness status unknown, initial encounter: Secondary | ICD-10-CM

## 2013-07-11 DIAGNOSIS — S065X9A Traumatic subdural hemorrhage with loss of consciousness of unspecified duration, initial encounter: Secondary | ICD-10-CM

## 2013-07-11 NOTE — Addendum Note (Signed)
Encounter addended by: Agnes Lawrence, RN on: 07/11/2013 10:19 AM<BR>     Documentation filed: Notes Section

## 2013-07-11 NOTE — Progress Notes (Signed)
Received call from [facility] confirming that this patient does NOT have a blood clot in his right leg. Will inform Dr. Kathrynn Running of this finding.

## 2013-07-11 NOTE — Progress Notes (Signed)
Received fax from [employer] benefit services with general benefit information.  Patient has met his deductible and his out of pocket maximum.  Called back for more information [phone]) and spoke to [place], she stated they are paying at 100% for this patient's group since he has met both the deductible and out of pocket max.

## 2013-07-11 NOTE — Progress Notes (Signed)
*  Preliminary Results* Right lower extremity venous duplex completed. Right lower extremity is negative for deep vein thrombosis. There is no evidence of right Baker's cyst.  Preliminary results discussed with Pam, RN.  07/11/2013 9:12 AM  Gertie Fey, RVT, RDCS, RDMS

## 2013-07-12 ENCOUNTER — Encounter: Payer: Self-pay | Admitting: Radiation Oncology

## 2013-07-12 ENCOUNTER — Telehealth: Payer: Self-pay | Admitting: *Deleted

## 2013-07-12 NOTE — Progress Notes (Signed)
I spoke with patient's son today and gave him benefit information that was given to me by [employer] benefit services.  I also left a Medicaid application at the front desk for him to pick up.

## 2013-07-12 NOTE — Telephone Encounter (Signed)
CALLED PATIENT TO INFORM OF GOLD SEED PLACEMENT ON 08/10/13 ARRIVAL TIME - 2:45 PM AND HIS SIM ON 08/18/13 AT 9:00 AM, LVM FOR A RETURN CALL

## 2013-07-18 ENCOUNTER — Telehealth: Payer: Self-pay | Admitting: *Deleted

## 2013-07-18 NOTE — Telephone Encounter (Signed)
xxxx 

## 2013-08-01 ENCOUNTER — Telehealth: Payer: Self-pay | Admitting: Radiation Oncology

## 2013-08-01 NOTE — Telephone Encounter (Signed)
Completed [employer] paperwork in purple folder provided to this Clinical research associate by Marylene Land. Placed folder and paper work in Dr. Broadus John inbox for signature.

## 2013-08-07 ENCOUNTER — Telehealth: Payer: Self-pay | Admitting: Radiation Oncology

## 2013-08-07 NOTE — Telephone Encounter (Signed)
Thanks Sam,  I have completed the form and signed it.  It will be in my outbox at the end of the day today.  MM

## 2013-08-07 NOTE — Telephone Encounter (Signed)
Spoke with patient's daughter and explained [employer] paperwork is complete. She verbalized understanding.

## 2013-08-11 ENCOUNTER — Telehealth: Payer: Self-pay | Admitting: Radiation Oncology

## 2013-08-11 NOTE — Telephone Encounter (Signed)
Placed complete [employer] paperwork in purple folder in Dr. Broadus John inbox for signature.

## 2013-08-18 ENCOUNTER — Ambulatory Visit
Admission: RE | Admit: 2013-08-18 | Payer: PRIVATE HEALTH INSURANCE | Source: Ambulatory Visit | Admitting: Radiation Oncology

## 2013-08-23 ENCOUNTER — Ambulatory Visit
Admission: RE | Admit: 2013-08-23 | Discharge: 2013-08-23 | Disposition: A | Discharge status: Home / Self Care | Payer: PRIVATE HEALTH INSURANCE | Source: Ambulatory Visit | Attending: Radiation Oncology | Admitting: Radiation Oncology

## 2013-08-23 ENCOUNTER — Encounter: Payer: Self-pay | Admitting: Radiation Oncology

## 2013-08-23 DIAGNOSIS — C61 Malignant neoplasm of prostate: Secondary | ICD-10-CM | POA: Insufficient documentation

## 2013-08-23 DIAGNOSIS — M79609 Pain in unspecified limb: Secondary | ICD-10-CM | POA: Insufficient documentation

## 2013-08-23 DIAGNOSIS — R0789 Other chest pain: Secondary | ICD-10-CM | POA: Insufficient documentation

## 2013-08-23 DIAGNOSIS — R350 Frequency of micturition: Secondary | ICD-10-CM | POA: Insufficient documentation

## 2013-08-23 DIAGNOSIS — R197 Diarrhea, unspecified: Secondary | ICD-10-CM | POA: Insufficient documentation

## 2013-08-23 DIAGNOSIS — R300 Dysuria: Secondary | ICD-10-CM | POA: Insufficient documentation

## 2013-08-23 DIAGNOSIS — Z510 Encounter for antineoplastic radiation therapy: Secondary | ICD-10-CM | POA: Insufficient documentation

## 2013-08-23 NOTE — Progress Notes (Signed)
  Radiation Oncology         [phone] ________________________________  Name: [name] Johnston  MRN: [id number]  Date: 08/23/2013  DOB: 09/10/1951  SIMULATION AND TREATMENT PLANNING NOTE  DIAGNOSIS:  62 y.o. gentleman with stage T1c adenocarcinoma of the prostate with a Gleason's score of 4+4 and a PSA of 38  NARRATIVE:  The patient was brought to the CT Simulation planning suite.  Identity was confirmed.  All relevant records and images related to the planned course of therapy were reviewed.  The patient freely provided informed written consent to proceed with treatment after reviewing the details related to the planned course of therapy. The consent form was witnessed and verified by the simulation staff.  Then, the patient was set-up in a stable reproducible supine position for radiation therapy.  A vacuum lock pillow device was custom fabricated to position his legs in a reproducible immobilized position.  Then, I performed a urethrogram under sterile conditions to identify the prostatic apex.  CT images were obtained.  Surface markings were placed.  The CT images were loaded into the planning software.  Then the prostate target and avoidance structures including the rectum, bladder, bowel and hips were contoured.  Treatment planning then occurred.  The radiation prescription was entered and confirmed.  A total of one complex treatment device was fabricated. I have requested : Intensity Modulated Radiotherapy (IMRT) is medically necessary for this case for the following reason:  Rectal sparing.Marland Kitchen  PLAN:  The patient will receive 75 Gy in 40 fractions initially covering the prostate, seminal vesicles and pelvic nodes to 45 Gy.  ________________________________  Artist Pais. Kathrynn Running, M.D.

## 2013-09-05 ENCOUNTER — Ambulatory Visit
Admission: RE | Admit: 2013-09-05 | Discharge: 2013-09-05 | Disposition: A | Discharge status: Home / Self Care | Payer: PRIVATE HEALTH INSURANCE | Source: Ambulatory Visit | Attending: Radiation Oncology | Admitting: Radiation Oncology

## 2013-09-06 ENCOUNTER — Encounter: Payer: Self-pay | Admitting: Radiation Oncology

## 2013-09-06 ENCOUNTER — Ambulatory Visit
Admission: RE | Admit: 2013-09-06 | Discharge: 2013-09-06 | Disposition: A | Discharge status: Home / Self Care | Payer: PRIVATE HEALTH INSURANCE | Source: Ambulatory Visit | Attending: Radiation Oncology | Admitting: Radiation Oncology

## 2013-09-06 VITALS — BP 139/86 | HR 75 | Temp 98.0°F | Ht 63.0 in | Wt 138.9 lb

## 2013-09-06 DIAGNOSIS — C61 Malignant neoplasm of prostate: Secondary | ICD-10-CM

## 2013-09-06 NOTE — Progress Notes (Signed)
  Radiation Oncology         [phone] ________________________________  Name: [name] Johnston MRN: [id number]  Date: 09/06/2013  DOB: 1951/12/04  Weekly Radiation Therapy Management  Current Dose: 3.6 Gy     Planned Dose:  75 Gy  Narrative . . . . . . . . The patient presents for routine under treatment assessment.                                The patient is without complaint.                                 Set-up films were reviewed.                                 The chart was checked. Physical Findings. . .  height is 5\' 3"  (1.6 m) and weight is 138 lb 14.4 oz (63.005 kg). His temperature is 98 F (36.7 C). His blood pressure is 139/86 and his pulse is 75. . Weight essentially stable.  No significant changes. Impression . . . . . . . The patient is tolerating radiation. Plan . . . . . . . . . . . . Continue treatment as planned.  ________________________________  Artist Pais. Kathrynn Running, M.D.

## 2013-09-06 NOTE — Progress Notes (Signed)
[name] Johnston here with his interpreter for weekly under treat visit. He has had 2 fractions to his prostate.  He denies pain but has had burning with urination since before radiation started.  He also reported that he had a fever and chills last week and again on Sunday.  He does not have a thermometer so he was not sure what his temp was.  He usually gets up 5 times per night to urinate.  He denies diarrhea, fatigue and hematuria.  He was given the Radiation Therapy and You book.  He said his son was able to read English so he will go over it with him.  We did discuss potential side effects including fatigue, diarrhea, skin changes and bladder changes.  He was oriented to the clinic and was educated about under treat day.

## 2013-09-07 ENCOUNTER — Ambulatory Visit: Payer: PRIVATE HEALTH INSURANCE | Admitting: Radiation Oncology

## 2013-09-07 ENCOUNTER — Ambulatory Visit
Admission: RE | Admit: 2013-09-07 | Discharge: 2013-09-07 | Disposition: A | Discharge status: Home / Self Care | Payer: PRIVATE HEALTH INSURANCE | Source: Ambulatory Visit | Attending: Radiation Oncology | Admitting: Radiation Oncology

## 2013-09-08 ENCOUNTER — Ambulatory Visit
Admission: RE | Admit: 2013-09-08 | Discharge: 2013-09-08 | Disposition: A | Discharge status: Home / Self Care | Payer: PRIVATE HEALTH INSURANCE | Source: Ambulatory Visit | Attending: Radiation Oncology | Admitting: Radiation Oncology

## 2013-09-11 ENCOUNTER — Ambulatory Visit
Admission: RE | Admit: 2013-09-11 | Discharge: 2013-09-11 | Disposition: A | Discharge status: Home / Self Care | Payer: PRIVATE HEALTH INSURANCE | Source: Ambulatory Visit | Attending: Radiation Oncology | Admitting: Radiation Oncology

## 2013-09-12 ENCOUNTER — Ambulatory Visit
Admission: RE | Admit: 2013-09-12 | Discharge: 2013-09-12 | Disposition: A | Discharge status: Home / Self Care | Payer: PRIVATE HEALTH INSURANCE | Source: Ambulatory Visit | Attending: Radiation Oncology | Admitting: Radiation Oncology

## 2013-09-13 ENCOUNTER — Ambulatory Visit
Admission: RE | Admit: 2013-09-13 | Discharge: 2013-09-13 | Disposition: A | Discharge status: Home / Self Care | Payer: PRIVATE HEALTH INSURANCE | Source: Ambulatory Visit | Attending: Radiation Oncology | Admitting: Radiation Oncology

## 2013-09-14 ENCOUNTER — Ambulatory Visit
Admission: RE | Admit: 2013-09-14 | Discharge: 2013-09-14 | Disposition: A | Discharge status: Home / Self Care | Payer: PRIVATE HEALTH INSURANCE | Source: Ambulatory Visit | Attending: Radiation Oncology | Admitting: Radiation Oncology

## 2013-09-15 ENCOUNTER — Ambulatory Visit
Admission: RE | Admit: 2013-09-15 | Discharge: 2013-09-15 | Disposition: A | Discharge status: Home / Self Care | Payer: PRIVATE HEALTH INSURANCE | Source: Ambulatory Visit | Attending: Radiation Oncology | Admitting: Radiation Oncology

## 2013-09-15 ENCOUNTER — Encounter: Payer: Self-pay | Admitting: Radiation Oncology

## 2013-09-15 VITALS — BP 108/71 | HR 69 | Resp 16 | Wt 138.3 lb

## 2013-09-15 DIAGNOSIS — C61 Malignant neoplasm of prostate: Secondary | ICD-10-CM

## 2013-09-15 NOTE — Progress Notes (Signed)
Continues to take rapaflo as directed. Reports mild dysuria but, denies hematuria. Reports that he gets up 6-7 times per night to void thus, unable to get restful nights sleep. Reports diarrhea at night following treatments. Reports taking tylenol each night for aching in his legs and arms. Denies pain at this time.

## 2013-09-16 ENCOUNTER — Encounter: Payer: Self-pay | Admitting: Radiation Oncology

## 2013-09-16 NOTE — Progress Notes (Signed)
  Radiation Oncology         [phone] ________________________________  Name: [name] Johnston MRN: [id number]  Date: 09/15/2013  DOB: October 13, 1951  Weekly Radiation Therapy Management  Current Dose: 16.2 Gy     Planned Dose:  75 Gy  Narrative . . . . . . . . The patient presents for routine under treatment assessment.  Continues to take rapaflo as directed. Reports mild dysuria but, denies hematuria. Reports that he gets up 6-7 times per night to void thus, unable to get restful nights sleep. Reports diarrhea at night following treatments. Reports taking tylenol each night for aching in his legs and arms. Denies pain at this time                                 Set-up films were reviewed.                                 The chart was checked. Physical Findings. . .  weight is 138 lb 4.8 oz (62.732 kg). His blood pressure is 108/71 and his pulse is 69. His respiration is 16. . Weight essentially stable.  No significant changes. Impression . . . . . . . The patient is  tolerating radiation. Plan . . . . . . . . . . . . Continue treatment as planned.  ________________________________  Artist Pais. Kathrynn Running, M.D.

## 2013-09-18 ENCOUNTER — Ambulatory Visit
Admission: RE | Admit: 2013-09-18 | Discharge: 2013-09-18 | Disposition: A | Discharge status: Home / Self Care | Payer: PRIVATE HEALTH INSURANCE | Source: Ambulatory Visit | Attending: Radiation Oncology | Admitting: Radiation Oncology

## 2013-09-19 ENCOUNTER — Ambulatory Visit
Admission: RE | Admit: 2013-09-19 | Discharge: 2013-09-19 | Disposition: A | Discharge status: Home / Self Care | Payer: PRIVATE HEALTH INSURANCE | Source: Ambulatory Visit | Attending: Radiation Oncology | Admitting: Radiation Oncology

## 2013-09-20 ENCOUNTER — Ambulatory Visit
Admission: RE | Admit: 2013-09-20 | Discharge: 2013-09-20 | Disposition: A | Discharge status: Home / Self Care | Payer: PRIVATE HEALTH INSURANCE | Source: Ambulatory Visit | Attending: Radiation Oncology | Admitting: Radiation Oncology

## 2013-09-21 ENCOUNTER — Ambulatory Visit
Admission: RE | Admit: 2013-09-21 | Discharge: 2013-09-21 | Disposition: A | Discharge status: Home / Self Care | Payer: PRIVATE HEALTH INSURANCE | Source: Ambulatory Visit | Attending: Radiation Oncology | Admitting: Radiation Oncology

## 2013-09-22 ENCOUNTER — Ambulatory Visit
Admission: RE | Admit: 2013-09-22 | Discharge: 2013-09-22 | Disposition: A | Discharge status: Home / Self Care | Payer: PRIVATE HEALTH INSURANCE | Source: Ambulatory Visit | Attending: Radiation Oncology | Admitting: Radiation Oncology

## 2013-09-22 ENCOUNTER — Encounter: Payer: Self-pay | Admitting: Radiation Oncology

## 2013-09-22 VITALS — BP 114/79 | HR 73 | Resp 16 | Wt 137.4 lb

## 2013-09-22 DIAGNOSIS — C61 Malignant neoplasm of prostate: Secondary | ICD-10-CM

## 2013-09-22 NOTE — Progress Notes (Signed)
Continues to take rapaflo as directed. Denies dysuria or hematuria. Reports that he gets up 6-7 times per night to void thus, unable to get restful nights sleep. Reports diarrhea at night following treatments. Reports taking tylenol on occasion at night for aching in his legs and arms. Denies pain at this time.

## 2013-09-24 ENCOUNTER — Encounter: Payer: Self-pay | Admitting: Radiation Oncology

## 2013-09-24 NOTE — Progress Notes (Signed)
  Radiation Oncology         [phone] ________________________________  Name: [name] Johnston  MRN: [id number]  Date: 09/22/2013  DOB: Aug 15, 1951  Weekly Radiation Therapy Management  Current Dose: 25.2 Gy     Planned Dose:  75 Gy  Narrative . . . . . . . . The patient presents for routine under treatment assessment.                                            Increased urinary frequency, otherwise the patient is without complaint.                                 Set-up films were reviewed.                                 The chart was checked. Physical Findings. . .  weight is 137 lb 6.4 oz (62.324 kg). His blood pressure is 114/79 and his pulse is 73. His respiration is 16. . Weight essentially stable.  No significant changes. Impression . . . . . . . The patient is  tolerating radiation. Plan . . . . . . . . . . . . Continue treatment as planned.  ________________________________  Artist Pais. Kathrynn Running, M.D.

## 2013-09-25 ENCOUNTER — Ambulatory Visit
Admission: RE | Admit: 2013-09-25 | Discharge: 2013-09-25 | Disposition: A | Discharge status: Home / Self Care | Payer: PRIVATE HEALTH INSURANCE | Source: Ambulatory Visit | Attending: Radiation Oncology | Admitting: Radiation Oncology

## 2013-09-26 ENCOUNTER — Ambulatory Visit
Admission: RE | Admit: 2013-09-26 | Discharge: 2013-09-26 | Disposition: A | Discharge status: Home / Self Care | Payer: PRIVATE HEALTH INSURANCE | Source: Ambulatory Visit | Attending: Radiation Oncology | Admitting: Radiation Oncology

## 2013-09-27 ENCOUNTER — Ambulatory Visit
Admission: RE | Admit: 2013-09-27 | Discharge: 2013-09-27 | Disposition: A | Discharge status: Home / Self Care | Payer: PRIVATE HEALTH INSURANCE | Source: Ambulatory Visit | Attending: Radiation Oncology | Admitting: Radiation Oncology

## 2013-09-28 ENCOUNTER — Ambulatory Visit
Admission: RE | Admit: 2013-09-28 | Discharge: 2013-09-28 | Disposition: A | Discharge status: Home / Self Care | Payer: PRIVATE HEALTH INSURANCE | Source: Ambulatory Visit | Attending: Radiation Oncology | Admitting: Radiation Oncology

## 2013-09-29 ENCOUNTER — Encounter: Payer: Self-pay | Admitting: Radiation Oncology

## 2013-09-29 ENCOUNTER — Ambulatory Visit
Admission: RE | Admit: 2013-09-29 | Payer: PRIVATE HEALTH INSURANCE | Source: Ambulatory Visit | Admitting: Radiation Oncology

## 2013-09-29 ENCOUNTER — Ambulatory Visit
Admission: RE | Admit: 2013-09-29 | Discharge: 2013-09-29 | Disposition: A | Discharge status: Home / Self Care | Payer: PRIVATE HEALTH INSURANCE | Source: Ambulatory Visit | Attending: Radiation Oncology | Admitting: Radiation Oncology

## 2013-09-29 VITALS — BP 126/81 | HR 76 | Resp 16 | Wt 136.5 lb

## 2013-09-29 DIAGNOSIS — C61 Malignant neoplasm of prostate: Secondary | ICD-10-CM

## 2013-09-29 MED ORDER — SILODOSIN 8 MG PO CAPS: 8.0000 mg | ORAL_CAPSULE | Freq: Every day | ORAL | Status: DC

## 2013-09-29 NOTE — Progress Notes (Signed)
Patient denies any changes from last week. Continues to take rapaflo as directed. Denies dysuria or hematuria. Reports that he gets up 6-7 times per night to void thus, unable to get restful nights sleep. Reports diarrhea at night following treatments. Reports taking tylenol on occasion at night for aching in his legs and arms. Denies pain at this time.

## 2013-09-29 NOTE — Progress Notes (Signed)
  Radiation Oncology         [phone] ________________________________  Name: [name] Johnston MRN: [id number]  Date: 09/29/2013  DOB: [birth year]  Weekly Radiation Therapy Management  Current Dose: 34.2 Gy     Planned Dose:  75 Gy  Narrative . . . . . . . . The patient presents for routine under treatment assessment.                                             Patient denies any changes from last week. Continues to take rapaflo as directed. Denies dysuria or hematuria. Reports that he gets up 6-7 times per night to void thus, unable to get restful nights sleep. Reports diarrhea at night following treatments. Reports taking tylenol on occasion at night for aching in his legs and arms. Denies pain at this time                  Set-up films were reviewed.                                 The chart was checked. Physical Findings. . .  weight is 136 lb 8 oz (61.916 kg). His blood pressure is 126/81 and his pulse is 76. His respiration is 16. . Weight essentially stable.  No significant changes. Impression . . . . . . . The patient is  tolerating radiation. Plan . . . . . . . . . . . . Continue treatment as planned. Refilled rapaflo and switched to QHS  ________________________________  Artist Pais. Kathrynn Running, M.D.

## 2013-10-02 ENCOUNTER — Ambulatory Visit
Admission: RE | Admit: 2013-10-02 | Discharge: 2013-10-02 | Disposition: A | Discharge status: Home / Self Care | Payer: PRIVATE HEALTH INSURANCE | Source: Ambulatory Visit | Attending: Radiation Oncology | Admitting: Radiation Oncology

## 2013-10-03 ENCOUNTER — Ambulatory Visit
Admission: RE | Admit: 2013-10-03 | Discharge: 2013-10-03 | Disposition: A | Discharge status: Home / Self Care | Payer: PRIVATE HEALTH INSURANCE | Source: Ambulatory Visit | Attending: Radiation Oncology | Admitting: Radiation Oncology

## 2013-10-04 ENCOUNTER — Ambulatory Visit
Admission: RE | Admit: 2013-10-04 | Discharge: 2013-10-04 | Disposition: A | Discharge status: Home / Self Care | Payer: PRIVATE HEALTH INSURANCE | Source: Ambulatory Visit | Attending: Radiation Oncology | Admitting: Radiation Oncology

## 2013-10-05 ENCOUNTER — Ambulatory Visit
Admission: RE | Admit: 2013-10-05 | Discharge: 2013-10-05 | Disposition: A | Discharge status: Home / Self Care | Payer: PRIVATE HEALTH INSURANCE | Source: Ambulatory Visit | Attending: Radiation Oncology | Admitting: Radiation Oncology

## 2013-10-06 ENCOUNTER — Ambulatory Visit
Admission: RE | Admit: 2013-10-06 | Discharge: 2013-10-06 | Disposition: A | Discharge status: Home / Self Care | Payer: PRIVATE HEALTH INSURANCE | Source: Ambulatory Visit | Attending: Radiation Oncology | Admitting: Radiation Oncology

## 2013-10-06 VITALS — BP 116/79 | HR 67 | Temp 98.6°F | Ht 63.0 in | Wt 137.6 lb

## 2013-10-06 DIAGNOSIS — C61 Malignant neoplasm of prostate: Secondary | ICD-10-CM

## 2013-10-06 NOTE — Progress Notes (Signed)
   Department of Radiation Oncology  Phone:  [phone] Fax:        [phone]  Weekly Treatment Note    Name: [name] Johnston Date: 10/06/2013 MRN: [id number] DOB: Aug 02, 1951   Current dose: 43.2 Gy  Current fraction: 24   MEDICATIONS: Current Outpatient Prescriptions  Medication Sig Dispense Refill  . acetaminophen (TYLENOL) 325 MG tablet Take 650 mg by mouth every 6 (six) hours as needed for pain.      Marland Kitchen albuterol (PROVENTIL HFA;VENTOLIN HFA) 108 (90 BASE) MCG/ACT inhaler Inhale 1-2 puffs into the lungs every 4 (four) hours as needed for wheezing.       Marland Kitchen aluminum & magnesium hydroxide-simethicone (MYLANTA) 500-450-40 MG/5ML suspension Take 10 mLs by mouth every 6 (six) hours as needed for indigestion.  355 mL  0  . cephALEXin (KEFLEX) 500 MG capsule Take 1 capsule (500 mg total) by mouth 2 (two) times daily.  20 capsule  0  . ciprofloxacin (CIPRO) 250 MG tablet Take 1 tablet (250 mg total) by mouth 2 (two) times daily.  10 tablet  0  . HYDROcodone-acetaminophen (NORCO/VICODIN) 5-325 MG per tablet Take 1-2 tablets by mouth every 4 (four) hours as needed.  30 tablet  0  . meclizine (ANTIVERT) 25 MG tablet Take 1 tablet (25 mg total) by mouth 3 (three) times daily as needed for dizziness.  30 tablet  0  . silodosin (RAPAFLO) 8 MG CAPS capsule Take 1 capsule (8 mg total) by mouth at bedtime.  30 capsule  5  . traMADol (ULTRAM) 50 MG tablet Take 1 tablet (50 mg total) by mouth every 8 (eight) hours as needed for pain.  30 tablet  0  . traMADol (ULTRAM) 50 MG tablet Take 50-100 mg by mouth every 6 (six) hours as needed for pain.       Marland Kitchen triamcinolone cream (KENALOG) 0.1 % Apply topically 2 (two) times daily.  454 g  1  . triamcinolone cream (KENALOG) 0.1 % Apply 1 application topically 2 (two) times daily.       No current facility-administered medications for this encounter.     ALLERGIES: Aspirin   LABORATORY DATA:  Lab Results  Component Value Date   WBC 13.1* 06/06/2013   HGB 12.4* 06/06/2013   HCT 36.8* 06/06/2013   MCV 78.1 06/06/2013   PLT 201 06/06/2013   Lab Results  Component Value Date   NA 133* 06/06/2013   K 4.0 06/06/2013   CL 96 06/06/2013   CO2 26 06/06/2013   Lab Results  Component Value Date   ALT 15 08/06/2010   AST 17 08/06/2010   ALKPHOS 72 08/06/2010   BILITOT 0.8 08/06/2010     NARRATIVE: [name] Johnston was seen today for weekly treatment management. The chart was checked and the patient's films were reviewed. The patient is doing well with his treatment. Occasional dysuria and some decreased strain. He gets up approximately 4 times per night which has been stable or slightly decreased from before. Occasional diarrhea and using Imodium medication was discussed with him.  PHYSICAL EXAMINATION: height is 5\' 3"  (1.6 m) and weight is 137 lb 9.6 oz (62.415 kg). His temperature is 98.6 F (37 C). His blood pressure is 116/79 and his pulse is 67.        ASSESSMENT: The patient is doing satisfactorily with treatment.  PLAN: We will continue with the patient's radiation treatment as planned.

## 2013-10-06 NOTE — Progress Notes (Signed)
[name] Johnston here with his interpreter for weekly under treat visit.  He has had 24 fractions to his prostate.  He is having burning with urination that he is rating at a 2/10.  He is also is reporting a weak stream that "shakes."  He is getting up 4 times per night to urinate.  He was getting up 6 times per night.  He reports having diarrhea 1-2 times per day.  Recommended using Imodium but he is concerned about constipation.  He denies hematuria.  He is fatigued.

## 2013-10-09 ENCOUNTER — Ambulatory Visit
Admission: RE | Admit: 2013-10-09 | Discharge: 2013-10-09 | Disposition: A | Discharge status: Home / Self Care | Payer: PRIVATE HEALTH INSURANCE | Source: Ambulatory Visit | Attending: Radiation Oncology | Admitting: Radiation Oncology

## 2013-10-10 ENCOUNTER — Ambulatory Visit
Admission: RE | Admit: 2013-10-10 | Discharge: 2013-10-10 | Disposition: A | Discharge status: Home / Self Care | Payer: PRIVATE HEALTH INSURANCE | Source: Ambulatory Visit | Attending: Radiation Oncology | Admitting: Radiation Oncology

## 2013-10-11 ENCOUNTER — Ambulatory Visit
Admission: RE | Admit: 2013-10-11 | Discharge: 2013-10-11 | Disposition: A | Discharge status: Home / Self Care | Payer: PRIVATE HEALTH INSURANCE | Source: Ambulatory Visit | Attending: Radiation Oncology | Admitting: Radiation Oncology

## 2013-10-11 ENCOUNTER — Encounter: Payer: Self-pay | Admitting: Radiation Oncology

## 2013-10-11 VITALS — BP 110/68 | HR 73 | Resp 16 | Wt 138.7 lb

## 2013-10-11 DIAGNOSIS — C61 Malignant neoplasm of prostate: Secondary | ICD-10-CM

## 2013-10-11 NOTE — Progress Notes (Signed)
  Radiation Oncology         [phone] ________________________________  Name: [name] Johnston MRN: [id number]  Date: 10/11/2013  DOB: 1951/04/09  Weekly Radiation Therapy Management  Current Dose: 49 Gy     Planned Dose:  75 Gy  Narrative . . . . . . . . The patient presents for routine under treatment assessment.                                  Patient denies any changes from last week. Continues to take rapaflo as directed. Denies dysuria or hematuria. Reports that he gets up 6-7 times per night to void thus, unable to get restful nights sleep. Reports diarrhea at night following treatments. Reports taking tylenol on occasion at night for aching in his legs and arms. Denies pain at this time                                 Set-up films were reviewed.                                 The chart was checked. Physical Findings. . .  weight is 138 lb 11.2 oz (62.914 kg). His blood pressure is 110/68 and his pulse is 73. His respiration is 16. . Weight essentially stable.  No significant changes. Impression . . . . . . . The patient is tolerating radiation. Plan . . . . . . . . . . . . Continue treatment as planned.  ________________________________  Artist Pais. Kathrynn Running, M.D.

## 2013-10-11 NOTE — Progress Notes (Signed)
Patient denies any changes from last week. Continues to take rapaflo as directed. Denies dysuria or hematuria. Reports that he gets up 6-7 times per night to void thus, unable to get restful nights sleep. Reports diarrhea at night following treatments. Reports taking tylenol on occasion at night for aching in his legs and arms. Denies pain at this time.   

## 2013-10-12 ENCOUNTER — Ambulatory Visit
Admission: RE | Admit: 2013-10-12 | Discharge: 2013-10-12 | Disposition: A | Discharge status: Home / Self Care | Payer: PRIVATE HEALTH INSURANCE | Source: Ambulatory Visit | Attending: Radiation Oncology | Admitting: Radiation Oncology

## 2013-10-13 ENCOUNTER — Ambulatory Visit
Admission: RE | Admit: 2013-10-13 | Discharge: 2013-10-13 | Disposition: A | Discharge status: Home / Self Care | Payer: PRIVATE HEALTH INSURANCE | Source: Ambulatory Visit | Attending: Radiation Oncology | Admitting: Radiation Oncology

## 2013-10-16 ENCOUNTER — Ambulatory Visit
Admission: RE | Admit: 2013-10-16 | Discharge: 2013-10-16 | Disposition: A | Discharge status: Home / Self Care | Payer: PRIVATE HEALTH INSURANCE | Source: Ambulatory Visit | Attending: Radiation Oncology | Admitting: Radiation Oncology

## 2013-10-17 ENCOUNTER — Ambulatory Visit
Admission: RE | Admit: 2013-10-17 | Discharge: 2013-10-17 | Disposition: A | Discharge status: Home / Self Care | Payer: PRIVATE HEALTH INSURANCE | Source: Ambulatory Visit | Attending: Radiation Oncology | Admitting: Radiation Oncology

## 2013-10-18 ENCOUNTER — Ambulatory Visit
Admission: RE | Admit: 2013-10-18 | Discharge: 2013-10-18 | Disposition: A | Discharge status: Home / Self Care | Payer: PRIVATE HEALTH INSURANCE | Source: Ambulatory Visit | Attending: Radiation Oncology | Admitting: Radiation Oncology

## 2013-10-18 ENCOUNTER — Encounter: Payer: Self-pay | Admitting: Radiation Oncology

## 2013-10-18 DIAGNOSIS — C61 Malignant neoplasm of prostate: Secondary | ICD-10-CM

## 2013-10-18 NOTE — Progress Notes (Signed)
  Radiation Oncology         [phone] ________________________________  Name: [name] Johnston MRN: [id number]  Date: 10/18/2013  DOB: August 30, 1951  Weekly Radiation Therapy Management  Current Dose: 59 Gy     Planned Dose:  75 Gy  Narrative . . . . . . . . The patient presents for routine under treatment assessment.                                   The patient is without complaint.  Some dysuria, also some sharp chest discomfort.                                 Set-up films were reviewed.                                 The chart was checked. Physical Findings. . . . Weight essentially stable.  No significant changes. Impression . . . . . . . The patient is tolerating radiation. Plan . . . . . . . . . . . . Continue treatment as planned.  ________________________________  Artist Pais. Kathrynn Running, M.D.

## 2013-10-19 ENCOUNTER — Ambulatory Visit
Admission: RE | Admit: 2013-10-19 | Discharge: 2013-10-19 | Disposition: A | Discharge status: Home / Self Care | Payer: PRIVATE HEALTH INSURANCE | Source: Ambulatory Visit | Attending: Radiation Oncology | Admitting: Radiation Oncology

## 2013-10-20 ENCOUNTER — Ambulatory Visit
Admission: RE | Admit: 2013-10-20 | Discharge: 2013-10-20 | Disposition: A | Discharge status: Home / Self Care | Payer: PRIVATE HEALTH INSURANCE | Source: Ambulatory Visit | Attending: Radiation Oncology | Admitting: Radiation Oncology

## 2013-10-21 DIAGNOSIS — Z923 Personal history of irradiation: Secondary | ICD-10-CM

## 2013-10-21 HISTORY — DX: Personal history of irradiation: Z92.3

## 2013-10-23 ENCOUNTER — Ambulatory Visit
Admission: RE | Admit: 2013-10-23 | Discharge: 2013-10-23 | Disposition: A | Discharge status: Home / Self Care | Payer: PRIVATE HEALTH INSURANCE | Source: Ambulatory Visit | Attending: Radiation Oncology | Admitting: Radiation Oncology

## 2013-10-24 ENCOUNTER — Ambulatory Visit
Admission: RE | Admit: 2013-10-24 | Discharge: 2013-10-24 | Disposition: A | Discharge status: Home / Self Care | Payer: PRIVATE HEALTH INSURANCE | Source: Ambulatory Visit | Attending: Radiation Oncology | Admitting: Radiation Oncology

## 2013-10-25 ENCOUNTER — Ambulatory Visit
Admission: RE | Admit: 2013-10-25 | Discharge: 2013-10-25 | Disposition: A | Discharge status: Home / Self Care | Payer: PRIVATE HEALTH INSURANCE | Source: Ambulatory Visit | Attending: Radiation Oncology | Admitting: Radiation Oncology

## 2013-10-25 ENCOUNTER — Encounter: Payer: Self-pay | Admitting: Radiation Oncology

## 2013-10-25 VITALS — BP 118/71 | HR 76 | Temp 98.4°F | Ht 63.0 in | Wt 138.5 lb

## 2013-10-25 DIAGNOSIS — C61 Malignant neoplasm of prostate: Secondary | ICD-10-CM

## 2013-10-25 MED ORDER — PHENAZOPYRIDINE HCL 200 MG PO TABS: 200.0000 mg | ORAL_TABLET | Freq: Three times a day (TID) | ORAL | Status: DC | PRN

## 2013-10-25 NOTE — Progress Notes (Signed)
[name] Johnston is in exam room 7.  He has completed 12/15 treatments to his boost field.  He c/o burning upon urination, but denies any foul odor.  He has nocturia 6-7 times.  He denies any diarrhea or loose stools, and denies rectal irritation.  He also reports some fatigue.

## 2013-10-25 NOTE — Progress Notes (Signed)
  Radiation Oncology         [phone] ________________________________  Name: [name] Johnston MRN: [id number]  Date: 10/25/2013  DOB: 1951-11-30  Weekly Radiation Therapy Management  Current Dose: 69 Gy     Planned Dose:  75 Gy  Narrative . . . . . . . . The patient presents for routine under treatment assessment.                                   The patient is without complaint.                                 Set-up films were reviewed.                                 The chart was checked. Physical Findings. . .  height is 5\' 3"  (1.6 m) and weight is 138 lb 8 oz (62.823 kg). His temperature is 98.4 F (36.9 C). His blood pressure is 118/71 and his pulse is 76. . Weight essentially stable.  No significant changes. Impression . . . . . . . The patient is tolerating radiation. Plan . . . . . . . . . . . . Continue treatment as planned.  ________________________________  Artist Pais. Kathrynn Running, M.D.

## 2013-10-26 ENCOUNTER — Ambulatory Visit
Admission: RE | Admit: 2013-10-26 | Discharge: 2013-10-26 | Disposition: A | Discharge status: Home / Self Care | Payer: PRIVATE HEALTH INSURANCE | Source: Ambulatory Visit | Attending: Radiation Oncology | Admitting: Radiation Oncology

## 2013-10-27 ENCOUNTER — Ambulatory Visit
Admission: RE | Admit: 2013-10-27 | Discharge: 2013-10-27 | Disposition: A | Discharge status: Home / Self Care | Payer: PRIVATE HEALTH INSURANCE | Source: Ambulatory Visit | Attending: Radiation Oncology | Admitting: Radiation Oncology

## 2013-10-30 ENCOUNTER — Encounter: Payer: Self-pay | Admitting: Radiation Oncology

## 2013-10-30 ENCOUNTER — Ambulatory Visit
Admission: RE | Admit: 2013-10-30 | Discharge: 2013-10-30 | Disposition: A | Discharge status: Home / Self Care | Payer: PRIVATE HEALTH INSURANCE | Source: Ambulatory Visit | Attending: Radiation Oncology | Admitting: Radiation Oncology

## 2013-11-07 NOTE — Progress Notes (Signed)
°  Radiation Oncology         [phone] ________________________________  Name: Davante Gerke MRN: [id number]  Date: 10/30/2013  DOB: June 11, 1951  End of Treatment Note  Diagnosis:   62 y.o. gentleman with stage T1c adenocarcinoma of the prostate with a Gleason's score of 4+4 and a PSA of 38  Indication for treatment:  Curative, Definitive IMRT with androgen deprivation       Radiation treatment dates:   09/05/2013-10/30/2013  Site/dose:    1.  The prostate, seminal vesicles, and pelvic lymph nodes were treated to 45 Gy in 25 fractions of 1.8 Gy 2.  The prostate only was boosted to 75 Gy with 15 additional fractions of 2 Gy.  Beams/energy:    1.  The prostate, seminal vesicles, and pelvic lymph nodes were treated with IMRT on TomoTherapy with image-guidance daily to position the target and verify bladder/rectal filling.  6 MV photons are used with TomoTherapy.  Leg positioning was achieved with a customized BodyFix pillow. 2.  The prostate only was boosted with IMRT on TomoTherapy with image-guidance daily to position the target and verify bladder/rectal filling.  6 MV photons are used with TomoTherapy.  Leg positioning was achieved with a customized BodyFix pillow.  Narrative: The patient tolerated radiation treatment relatively well.   He had some burning upon urination. He has had nocturia 6-7 times. He denied any diarrhea or loose stools, and denied rectal irritation. He also reported some fatigue  Plan: The patient has completed radiation treatment. The patient will return to radiation oncology clinic for routine followup in one month. I advised them to call or return sooner if they have any questions or concerns related to their recovery or treatment. ________________________________  Artist Pais. Kathrynn Running, M.D.

## 2014-01-02 ENCOUNTER — Emergency Department (HOSPITAL_COMMUNITY)
Admission: EM | Admit: 2014-01-02 | Discharge: 2014-01-02 | Disposition: A | Discharge status: Home / Self Care | Payer: PRIVATE HEALTH INSURANCE | Attending: Emergency Medicine | Admitting: Emergency Medicine

## 2014-01-02 ENCOUNTER — Encounter (HOSPITAL_COMMUNITY): Payer: Self-pay | Admitting: Emergency Medicine

## 2014-01-02 DIAGNOSIS — I10 Essential (primary) hypertension: Secondary | ICD-10-CM | POA: Insufficient documentation

## 2014-01-02 DIAGNOSIS — Z79899 Other long term (current) drug therapy: Secondary | ICD-10-CM | POA: Insufficient documentation

## 2014-01-02 DIAGNOSIS — Z9109 Other allergy status, other than to drugs and biological substances: Secondary | ICD-10-CM | POA: Insufficient documentation

## 2014-01-02 DIAGNOSIS — Z8639 Personal history of other endocrine, nutritional and metabolic disease: Secondary | ICD-10-CM | POA: Insufficient documentation

## 2014-01-02 DIAGNOSIS — K5900 Constipation, unspecified: Secondary | ICD-10-CM | POA: Insufficient documentation

## 2014-01-02 DIAGNOSIS — R339 Retention of urine, unspecified: Secondary | ICD-10-CM | POA: Insufficient documentation

## 2014-01-02 DIAGNOSIS — Z8669 Personal history of other diseases of the nervous system and sense organs: Secondary | ICD-10-CM | POA: Insufficient documentation

## 2014-01-02 DIAGNOSIS — Z862 Personal history of diseases of the blood and blood-forming organs and certain disorders involving the immune mechanism: Secondary | ICD-10-CM | POA: Insufficient documentation

## 2014-01-02 DIAGNOSIS — J45909 Unspecified asthma, uncomplicated: Secondary | ICD-10-CM | POA: Insufficient documentation

## 2014-01-02 DIAGNOSIS — Z87828 Personal history of other (healed) physical injury and trauma: Secondary | ICD-10-CM | POA: Insufficient documentation

## 2014-01-02 DIAGNOSIS — C7982 Secondary malignant neoplasm of genital organs: Secondary | ICD-10-CM | POA: Insufficient documentation

## 2014-01-02 DIAGNOSIS — Z792 Long term (current) use of antibiotics: Secondary | ICD-10-CM | POA: Insufficient documentation

## 2014-01-02 DIAGNOSIS — N390 Urinary tract infection, site not specified: Secondary | ICD-10-CM

## 2014-01-02 DIAGNOSIS — Z8551 Personal history of malignant neoplasm of bladder: Secondary | ICD-10-CM | POA: Insufficient documentation

## 2014-01-02 LAB — CBC WITH DIFFERENTIAL/PLATELET
Basophils Absolute: 0 10*3/uL (ref 0.0–0.1)
Basophils Relative: 0 % (ref 0–1)
Eosinophils Absolute: 0.1 10*3/uL (ref 0.0–0.7)
Eosinophils Relative: 2 % (ref 0–5)
HCT: 35.5 % — ABNORMAL LOW (ref 39.0–52.0)
HEMOGLOBIN: 12.1 g/dL — AB (ref 13.0–17.0)
LYMPHS ABS: 0.6 10*3/uL — AB (ref 0.7–4.0)
LYMPHS PCT: 11 % — AB (ref 12–46)
MCH: 27 pg (ref 26.0–34.0)
MCHC: 34.1 g/dL (ref 30.0–36.0)
MCV: 79.2 fL (ref 78.0–100.0)
Monocytes Absolute: 0.5 10*3/uL (ref 0.1–1.0)
Monocytes Relative: 9 % (ref 3–12)
NEUTROS ABS: 4.1 10*3/uL (ref 1.7–7.7)
NEUTROS PCT: 78 % — AB (ref 43–77)
PLATELETS: 192 10*3/uL (ref 150–400)
RBC: 4.48 MIL/uL (ref 4.22–5.81)
RDW: 12.7 % (ref 11.5–15.5)
WBC: 5.3 10*3/uL (ref 4.0–10.5)

## 2014-01-02 LAB — URINE MICROSCOPIC-ADD ON

## 2014-01-02 LAB — URINALYSIS, ROUTINE W REFLEX MICROSCOPIC
Bilirubin Urine: NEGATIVE
GLUCOSE, UA: NEGATIVE mg/dL
Ketones, ur: NEGATIVE mg/dL
Nitrite: POSITIVE — AB
PROTEIN: 100 mg/dL — AB
SPECIFIC GRAVITY, URINE: 1.011 (ref 1.005–1.030)
UROBILINOGEN UA: 1 mg/dL (ref 0.0–1.0)
pH: 6 (ref 5.0–8.0)

## 2014-01-02 LAB — BASIC METABOLIC PANEL
BUN: 9 mg/dL (ref 6–23)
CALCIUM: 9 mg/dL (ref 8.4–10.5)
CO2: 26 meq/L (ref 19–32)
Chloride: 99 mEq/L (ref 96–112)
Creatinine, Ser: 0.96 mg/dL (ref 0.50–1.35)
GFR calc Af Amer: >90 mL/min (ref >90)
GFR, EST NON AFRICAN AMERICAN: 86 mL/min — AB (ref >90)
Glucose, Bld: 116 mg/dL — ABNORMAL HIGH (ref 70–99)
POTASSIUM: 3.7 meq/L (ref 3.7–5.3)
SODIUM: 138 meq/L (ref 137–147)

## 2014-01-02 MED ORDER — ACETAMINOPHEN 325 MG PO TABS
650.0000 mg | ORAL_TABLET | Freq: Once | ORAL | Status: AC
  Administered 2014-01-02: 650 mg via ORAL
  Filled 2014-01-02: qty 2

## 2014-01-02 NOTE — ED Notes (Signed)
Went over leg bag and standard drainage bag care while at home before returning to see Dr. Risa Grill, pt's son verbalizes understanding at this time

## 2014-01-02 NOTE — Discharge Instructions (Signed)
°Emergency Department Resource Guide °1) Find a Doctor and Pay Out of Pocket °Although you won't have to find out who is covered by your insurance plan, it is a good idea to ask around and get recommendations. You will then need to call the office and see if the doctor you have chosen will accept you as a new patient and what types of options they offer for patients who are self-pay. Some doctors offer discounts or will set up payment plans for their patients who do not have insurance, but you will need to ask so you aren't surprised when you get to your appointment. ° °2) Contact Your Local Health Department °Not all health departments have doctors that can see patients for sick visits, but many do, so it is worth a call to see if yours does. If you don't know where your local health department is, you can check in your phone book. The CDC also has a tool to help you locate your state's health department, and many state websites also have listings of all of their local health departments. ° °3) Find a Walk-in Clinic °If your illness is not likely to be very severe or complicated, you may want to try a walk in clinic. These are popping up all over the country in pharmacies, drugstores, and shopping centers. They're usually staffed by nurse practitioners or physician assistants that have been trained to treat common illnesses and complaints. They're usually fairly quick and inexpensive. However, if you have serious medical issues or chronic medical problems, these are probably not your best option. ° °No Primary Care Doctor: °- Call [facility] at  832-8000 - they can help you locate a primary care doctor that  accepts your insurance, provides certain services, etc. °- Physician Referral Service- 1-800-533-3463 ° °Chronic Pain Problems: °Organization         Address  Phone   Notes  °[facility]  (336) 297-2271 Patients need to be referred by their primary care doctor.  ° °Medication  Assistance: °Organization         Address  Phone   Notes  °[place] [facility] [address] °[place], [place] [address] (336) 641-8030 --Must be a resident of [place] °-- Must have NO insurance coverage whatsoever (no Medicaid/ Medicare, etc.) °-- The pt. MUST have a primary care doctor that directs their care regularly and follows them in the community °  °[facility]  (866) 331-1348   °[employer]  (888) 892-1162   ° °Agencies that provide inexpensive medical care: °Organization         Address  Phone   Notes  °[facility]  (336) 832-8035   °[facility]    (336) 832-7272   °[facility] [address] °[place], [place] [address] (336) 832-4777   °[facility] [address], °[place] (336) 271-4999   °[facility]    (336) 373-0678   °[facility]    (336) 272-1050   °[facility] ° [address], [place] Phone:  (336) 832-4444, Fax:  (336) 832-4440 Hours of Operation:  9 am - 6 pm, M-F.  Also accepts Medicaid/Medicare and self-pay.  °[facility] ° [address], [place] Phone: (336) 832-3150, Fax: (336) 832-3151. Hours of Operation:  8:30 am - 5:30 pm, M-F.  Also accepts Medicaid and self-pay.  °[facility] [place] [address], [place] Phone: (336) 878-6027   °[facility] [address], [place], [place] (336)723-1848, Ext. 123 Mondays & Thursdays: 7-9 AM.  First 15 patients are seen on a first come, first serve basis. °  ° °Medicaid-accepting [place] Providers: ° °Organization         Address  Phone   Notes  °[facility] [address], [place] (336) 641-2100 Also accepts self-pay patients.  °[facility] [address], [place] ° (336) 856-9996   °[facility] [address], [place]  (336) 288-8857   °[facility] [address] [place] [address], [place] (336) 299-7000   °Veita Bland [address], [place]  ° (336) 373-1557 Only accepts [place] Access Medicaid patients after they have their name applied to their card.  ° °Self-Pay (no insurance) in [place]: ° °Organization         Address  Phone   Notes  °Sickle Cell Patients, [facility] [address], [place] (336) 832-1970   °[facility] [address], [place] (336) 832-4400   °[facility] ° [address], [place] (336) 992-4800   °[facility]/Dr. Osei-Bonsu ° [address] [place] [address], [place] or [address], [place] (336) 841-8500 Phone number for both [place] and [place] locations is the same.  °[facility] [address], [place] (336) 299-0000   °[facility] [address] [place] [address], [place] or [address] (336) 852-7530 °(336) 878-2260   °[facility] [address], [place] (336) 350-1642, phone; (336) 294-5005, fax Sees patients 1st and 3rd Saturday of every month.  Must not qualify for public or private insurance (i.e. Medicaid, Medicare, [place] Health Choice, Veterans' Benefits) • Household income should be no more than 200% of the poverty level •The clinic cannot treat you if you are pregnant or think you are pregnant • Sexually transmitted diseases are not treated at the clinic.  ° ° °Dental Care: °Organization         Address  Phone  Notes  °[place] [facility] [address], [place] (336) 641-6152 Accepts children up to age 21 who are enrolled in Medicaid or [place] Health Choice; pregnant women with a Medicaid card; and children who have applied for Medicaid or [place] Health Choice, but were declined, whose parents can pay a reduced fee at time of service.  °[place]  [facility] [place]  [address], [place] (336) 641-7733 Accepts children up to age 21 who are enrolled in Medicaid or [place] Health Choice; pregnant women with a Medicaid card; and children who have applied for Medicaid or  Health Choice, but were declined, whose parents can pay a reduced fee at time of service.  °[facility] ° [address],  (336) 641-4533 Patients are seen by appointment only. Walk-ins are not accepted. [facility] will see patients 18 years of age and older. °Monday - Tuesday (8am-5pm) °Most Wednesdays (8:30-5pm) °$30 per visit, cash only  °[facility] ° [address], [place] (336) 641-4533 Patients are seen by appointment only. Walk-ins are not accepted. [facility] will see patients 18 years of age and older. °One   Wednesday Evening (Monthly: Volunteer Based).  $30 per visit, cash only  °[facility]  (919) 537-3737 for adults; Children under age 4, call Graduate Pediatric Dentistry at ([phone]) [phone]. Children aged 4-14, please call (919) [phone] to request a pediatric application. ° Dental services are provided in all areas of dental care including fillings, crowns and bridges, complete and partial dentures, implants, gum treatment, root canals, and extractions. Preventive care is also provided. Treatment is provided to both adults and children. °Patients are selected via a lottery and there is often a waiting list. °  °[facility] [address], °[place] ° (336) 763-8833 [email or web address] °  °[facility] [address], [place], [place] (336)723-1848, Ext. 123 Second and Fourth Thursday of each month, opens at 6:30 AM; Clinic ends at 9 AM.  Patients are seen on a first-come first-served basis, and a limited number are seen during each clinic.  ° °[facility] ° [address], [place], [place] (336) 723-7904    Eligibility Requirements °You must have lived in [place], [place], or [place] counties for at least the last three months. °  You cannot be eligible for state or federal sponsored healthcare insurance, including Veterans Administration, Medicaid, or Medicare. °  You generally cannot be eligible for healthcare insurance through your employer.  °  How to apply: °Eligibility screenings are held every Tuesday and Wednesday afternoon from 1:00 pm until 4:00 pm. You do not need an appointment for the interview!  °[facility] [address], [place], [place] 336-631-2330   °[facility] [facility]  336-342-8273   °[facility]  336-703-3100   °[facility]  336-570-6415   ° °Behavioral Health Resources in the Community: °Intensive Outpatient Programs °Organization         Address  Phone  Notes  °[facility] [address], [place], [place] 336-878-6098   °[facility] [address], [place], [place] 336-832-9800   °[facility] [address], [place], [place] ° 336-882-2125   °[facility] [address],  °[place], [place] 1-800-853-5163 or 336-641-4981   °Substance Abuse Resources °Organization         Address  Phone  Notes  °[facility]  336-882-2125   °[facility]  336-784-9470   °[facility]  336-285-9073   °[facility]  336-845-3988   °Residential & Outpatient Substance Abuse Program  1-800-659-3381   °Psychological Services °Organization         Address  Phone  Notes  °[facility]  336- 832-9600   °[facility]  336- 378-7881   °[facility] [address], [place] 1-800-853-5163 or 336-641-4981   ° °Mobile Crisis Teams °Organization         Address  Phone  Notes  °[facility], Mobile Crisis Care Unit  1-877-626-1772   °Assertive °[facility] ° [address]  [place], [place] 336-834-9664   °Sharon DeEsch [address] °[place] 336-554-5454   ° °Self-Help/Support Groups °Organization         Address  Phone             Notes  °[facility]  - variety of support groups  336- 373-1402 Call for more information  °Narcotics Anonymous (NA), [facility] [address], °[place]  2 meetings at this location  ° °  Residential Treatment Programs °Organization         Address  Phone  Notes  °[facility] [address],    °[place]  1-866-801-8205   °[facility] ° [address], [place], [place] 704-293-8524   °[facility] [address], [place] 336-845-3988 Admissions: 8am-3pm M-F  °[facility] [address]    °[place], [place] 336-841-1104   °[facility] [address], [place], [place] 336-379-7146   °[facility] [address]  °[place], [place] 336-285-9073   °[facility] Intensive Outpatient [address], [place], [place] 336-852-3033   °[facility] [address]  °[place], [place] 1-877-615-2722 or 336-784-9470   °[facility] [address] [place], [place] 336-227-7417 Accepts Medicaid  °[facility] [address]  °[place] 1-800-659-3381 Substance Abuse/Addiction Treatment  ° °[place] Behavioral Health Resources °Organization         Address  Phone  Notes  °[facility]  (888) 581-9988   °Julie Brannon, PhD [address] [place], [place]   (336) 349-5553 or (336) 951-0000   °[facility]   [address] °[place], [place] (336) 349-4454   °[facility] [address], [place], [place] (336) 342-8316 Insurance/Medicaid/sponsorship through [facility]  °[facility] [address]                                    [place], [place] (336) 342-8316 Therapy/tele-psych/case    °[facility] [address]  ° [place], [place] (336) 349-2233    °Dr. Arfeen  (336) 349-4544   °[facility] [place]  [employer] [place] [facility] 1) [address], [place] °2) [address], [place] °3)  [address], [place] (336) 349-3220 °(336) 342-7768 ° °(336) 342-8140   °[place] Child Abuse [facility] (336) 342-1394 or (336) 342-3537 (After Hours)    ° ° °
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
## 2014-01-02 NOTE — ED Notes (Signed)
Pt is a prostate ca pt and had previous radation treatment. Seen urology today for abd pain and had an in and out cath done to obtain urine. Pt was dx with an UTI. Pt has not voided since 11am this am and has sever abd pain.

## 2014-01-03 LAB — URINE CULTURE
Colony Count: NO GROWTH
Culture: NO GROWTH

## 2014-01-23 ENCOUNTER — Encounter (HOSPITAL_BASED_OUTPATIENT_CLINIC_OR_DEPARTMENT_OTHER): Payer: Self-pay | Admitting: *Deleted

## 2014-01-23 ENCOUNTER — Other Ambulatory Visit: Payer: Self-pay | Admitting: Urology

## 2014-01-24 ENCOUNTER — Encounter (HOSPITAL_BASED_OUTPATIENT_CLINIC_OR_DEPARTMENT_OTHER): Payer: Self-pay | Admitting: *Deleted
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
## 2014-01-26 NOTE — H&P (Signed)
Reason For Visit   Mr. Acton presents today for a followup visit. I last saw him about a month ago, but since then he has been back here multiple times to see the nurse practitioners. We have been looking after him now for about 9 months and he has had ongoing and fairly significant voiding issues. This has been felt to be secondary to both significant obstruction from his locally advanced prostate cancer, as well as his radiation therapy. We were hopeful with ongoing hormonal therapy that things would improve, but they really have not. His PSA has come down very nicely. He does not have any known gross metastatic disease, but does have known locally advanced cancer. He was developing increased difficulty voiding and did have a positive culture. Despite treatment, he has further problems and had urinary retention. He has a Foley catheter indwelling at this time.      History of Present Illness       Past urologic history:   Mr. Ingwersen presents to discuss his recently diagnosed prostate cancer. He had presented with some fairly significant and progressive voiding issues. PSA was approximately 30. It was repeated noted to be closer to 40. CT imaging revealed what appeared to be a mass emanating off his ladder neck/prostatic urethra. The patient was taken to surgery in June of 2014 and noted to have what appeared to be a locally advanced prostate cancer extending out into the bladder neck and trigone region. CT did not reveal any evidence of obvious metastatic disease and bone scan was also negative. Final pathology revealed Gleason's 4+4 equals 8 adenocarcinoma involving approximate 70% of the resected prostate tissue.    At this point he appears to have a locally advanced but not grossly metastatic prostate cancer. My plan was to consider getting him started on hormonal therapy followed by radiation oncology consultation for consideration of I MRT therapy.     He has been on hormonal therapy  since July 2014. Testosterone has confirmed to be at castrate levels. PSA was most recently checked in November 2014 and was down to 0.76. The patient completed his radiation therapy in early November 2014.      Past Medical History Problems  1. History of Asthma (493.90)  Surgical History Problems  1. History of No Surgical Problems 2. History of Transurethral Resection Of Prostate (TURP)  Current Meds 1. Albuterol Sulfate SOLR;  Therapy: (Recorded:19May2014) to Recorded 2. Meclizine HCl TABS;  Therapy: (Recorded:19May2014) to Recorded 3. Proventil HFA 108 (90 Base) MCG/ACT Inhalation Aerosol Solution;  Therapy: (Recorded:19May2014) to Recorded 4. Tamsulosin HCl - 0.4 MG Oral Capsule; TAKE 1 CAPSULE EVERY DAY;  Therapy: [id number] to (Last Rx:25Nov2014)  Requested for: [id number] Ordered 5. TraMADol HCl - 50 MG Oral Tablet;  Therapy: (Recorded:19May2014) to Recorded 6. Uribel 118 MG Oral Capsule; TAKE 1 CAPSULE 4 times daily;  Therapy: 23Jan2015 to (Last Rx:23Jan2015) Ordered 7. Urogesic-Blue 81.6 MG Oral Tablet; TAKE 1 TABLET 3 times daily PRN dysuria;  Therapy: 20Jan2015 to (Last Rx:20Jan2015)  Requested for: 20Jan2015 Ordered  Allergies Medication  1. Aspirin Low Dose TABS  Family History Problems  1. Family history of Family Health Status - Father's Age : Father   2yr 2. Family history of Family Health Status - Mother's Age : Mother   890yr3. Family history of Family Health Status Number Of Children   2 sons and 3 daughters 4.26Family history of No Significant Family History  Social History Problems  1. Denied: History of Alcohol Use  2. Caffeine Use   1-2 per wk 3. Never A Smoker 4. Occupation:   retired 52. Denied: History of Tobacco Use  Review of Systems Genitourinary, constitutional, skin, eye, otolaryngeal, hematologic/lymphatic, cardiovascular, pulmonary, endocrine, musculoskeletal, gastrointestinal, neurological and psychiatric system(s) were  reviewed and pertinent findings if present are noted.  Genitourinary: urinary frequency, feelings of urinary urgency, dysuria, nocturia, difficulty starting the urinary stream, weak urinary stream, urinary stream starts and stops, hematuria and initiating urination requires straining.  Gastrointestinal: constipation.  Constitutional: feeling tired (fatigue).  Integumentary: skin rash/lesion.  Neurological: dizziness.    Vitals Vital Signs [Data Includes: Last 1 Day]  Recorded: [id number] 09:52AM  Blood Pressure: 127 / 86 Temperature: 98.3 F Heart Rate: 83  Physical Exam Constitutional: Well nourished and well developed . No acute distress.  Pulmonary: No respiratory distress and normal respiratory rhythm and effort.  Cardiovascular: Heart rate and rhythm are normal . No peripheral edema.  Abdomen: The abdomen is soft and nontender. No masses are palpated. No CVA tenderness. No hernias are palpable. No hepatosplenomegaly noted.  Genitourinary: Examination of the penis demonstrates an indwelling catheter, but no discharge, no masses, no lesions and a normal meatus. The scrotum is without lesions. The right epididymis is palpably normal and non-tender. The left epididymis is palpably normal and non-tender. The right testis is non-tender and without masses. The left testis is non-tender and without masses.  Skin: Normal skin turgor, no visible rash and no visible skin lesions.    Assessment Assessed  1. Incomplete bladder emptying (788.21) 2. Prostate cancer (185) 3. 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 Follow-up Schedule Surgery Office  Follow-up  Status: Hold For - Appointment   Requested for: [id number]  Discussion/Summary   Mr. Melott has a difficult situation. He really has had significant voiding difficulties ever since we first met him. I suspect this is multifactorial. At this pint, it is very difficult to know how much of his voiding problems are due to some  urethral stricture/bladder neck contracture versus the more locally advanced cancer. Bladder dysfunction is also certainly high on that list. I would propose a cystoscopy to really carefully assess him, possible balloon dilation of the urethra, conceivable resection of some additional prostatic tissue at the bladder neck, and also assessment of bladder capacity. I cannot promise him we can make things better, but we will certainly do our best to try. I would recommend leaving his Foley catheter indwelling until then, since it has been in and out.   Signatures Electronically signed by : Rana Snare, M.D.; Jan 19 2014 12:55PM EST

## 2014-01-29 ENCOUNTER — Ambulatory Visit (HOSPITAL_BASED_OUTPATIENT_CLINIC_OR_DEPARTMENT_OTHER)
Admission: RE | Admit: 2014-01-29 | Discharge: 2014-01-29 | Disposition: A | Discharge status: Home / Self Care | Payer: PRIVATE HEALTH INSURANCE | Source: Ambulatory Visit | Attending: Urology | Admitting: Urology

## 2014-01-29 ENCOUNTER — Encounter (HOSPITAL_BASED_OUTPATIENT_CLINIC_OR_DEPARTMENT_OTHER): Payer: PRIVATE HEALTH INSURANCE | Admitting: Anesthesiology

## 2014-01-29 ENCOUNTER — Ambulatory Visit (HOSPITAL_BASED_OUTPATIENT_CLINIC_OR_DEPARTMENT_OTHER): Payer: PRIVATE HEALTH INSURANCE | Admitting: Anesthesiology

## 2014-01-29 ENCOUNTER — Encounter (HOSPITAL_BASED_OUTPATIENT_CLINIC_OR_DEPARTMENT_OTHER): Payer: Self-pay | Admitting: *Deleted

## 2014-01-29 ENCOUNTER — Encounter (HOSPITAL_BASED_OUTPATIENT_CLINIC_OR_DEPARTMENT_OTHER)
Admission: RE | Disposition: A | Discharge status: Home / Self Care | Payer: Self-pay | Source: Ambulatory Visit | Attending: Urology

## 2014-01-29 DIAGNOSIS — R339 Retention of urine, unspecified: Secondary | ICD-10-CM | POA: Insufficient documentation

## 2014-01-29 DIAGNOSIS — C61 Malignant neoplasm of prostate: Secondary | ICD-10-CM | POA: Insufficient documentation

## 2014-01-29 DIAGNOSIS — Z886 Allergy status to analgesic agent status: Secondary | ICD-10-CM | POA: Insufficient documentation

## 2014-01-29 DIAGNOSIS — J45909 Unspecified asthma, uncomplicated: Secondary | ICD-10-CM | POA: Insufficient documentation

## 2014-01-29 DIAGNOSIS — N320 Bladder-neck obstruction: Secondary | ICD-10-CM | POA: Insufficient documentation

## 2014-01-29 DIAGNOSIS — Z79899 Other long term (current) drug therapy: Secondary | ICD-10-CM | POA: Insufficient documentation

## 2014-01-29 HISTORY — PX: CYSTOSCOPY WITH URETHRAL DILATATION: SHX5125

## 2014-01-29 HISTORY — DX: Personal history of other (healed) physical injury and trauma: Z87.828

## 2014-01-29 HISTORY — PX: TRANSURETHRAL RESECTION OF PROSTATE: SHX73

## 2014-01-29 LAB — POCT I-STAT, CHEM 8
BUN: 18 mg/dL (ref 6–23)
CHLORIDE: 101 meq/L (ref 96–112)
Calcium, Ion: 1.27 mmol/L (ref 1.13–1.30)
Creatinine, Ser: 1 mg/dL (ref 0.50–1.35)
Glucose, Bld: 118 mg/dL — ABNORMAL HIGH (ref 70–99)
HEMATOCRIT: 40 % (ref 39.0–52.0)
HEMOGLOBIN: 13.6 g/dL (ref 13.0–17.0)
POTASSIUM: 3.6 meq/L — AB (ref 3.7–5.3)
SODIUM: 143 meq/L (ref 137–147)
TCO2: 29 mmol/L (ref 0–100)

## 2014-01-29 SURGERY — CYSTOSCOPY, WITH URETHRAL DILATION
Anesthesia: General | Site: Urethra

## 2014-01-29 MED ORDER — LIDOCAINE HCL 2 % EX GEL
CUTANEOUS | Status: DC | PRN
  Administered 2014-01-29: 1 via URETHRAL

## 2014-01-29 MED ORDER — PROPOFOL 10 MG/ML IV BOLUS
INTRAVENOUS | Status: DC | PRN
  Administered 2014-01-29: 150 mg via INTRAVENOUS

## 2014-01-29 MED ORDER — PROMETHAZINE HCL 25 MG/ML IJ SOLN
6.2500 mg | INTRAMUSCULAR | Status: DC | PRN
  Filled 2014-01-29: qty 1

## 2014-01-29 MED ORDER — SODIUM CHLORIDE 0.9 % IR SOLN
Status: DC | PRN
  Administered 2014-01-29: 5000 mL via INTRAVESICAL

## 2014-01-29 MED ORDER — KETOROLAC TROMETHAMINE 30 MG/ML IJ SOLN
INTRAMUSCULAR | Status: DC | PRN
  Administered 2014-01-29: 30 mg via INTRAVENOUS

## 2014-01-29 MED ORDER — FENTANYL CITRATE 0.05 MG/ML IJ SOLN
INTRAMUSCULAR | Status: AC
  Filled 2014-01-29: qty 4

## 2014-01-29 MED ORDER — METHYLENE BLUE 1 % INJ SOLN
INTRAMUSCULAR | Status: DC | PRN
  Administered 2014-01-29: 6 mL via INTRAVENOUS

## 2014-01-29 MED ORDER — DEXAMETHASONE SODIUM PHOSPHATE 4 MG/ML IJ SOLN
INTRAMUSCULAR | Status: DC | PRN
  Administered 2014-01-29: 10 mg via INTRAVENOUS

## 2014-01-29 MED ORDER — BELLADONNA ALKALOIDS-OPIUM 16.2-60 MG RE SUPP
RECTAL | Status: DC | PRN
  Administered 2014-01-29: 1 via RECTAL

## 2014-01-29 MED ORDER — FENTANYL CITRATE 0.05 MG/ML IJ SOLN
25.0000 ug | INTRAMUSCULAR | Status: DC | PRN
  Administered 2014-01-29 (×2): 50 ug via INTRAVENOUS
  Filled 2014-01-29: qty 1

## 2014-01-29 MED ORDER — STERILE WATER FOR IRRIGATION IR SOLN
Status: DC | PRN
  Administered 2014-01-29: 1

## 2014-01-29 MED ORDER — FENTANYL CITRATE 0.05 MG/ML IJ SOLN
INTRAMUSCULAR | Status: DC | PRN
  Administered 2014-01-29 (×2): 25 ug via INTRAVENOUS
  Administered 2014-01-29: 50 ug via INTRAVENOUS

## 2014-01-29 MED ORDER — CEFAZOLIN SODIUM 1-5 GM-% IV SOLN
1.0000 g | INTRAVENOUS | Status: DC
  Filled 2014-01-29: qty 50

## 2014-01-29 MED ORDER — SOLIFENACIN SUCCINATE 10 MG PO TABS: 10.0000 mg | ORAL_TABLET | Freq: Every day | ORAL | Status: DC

## 2014-01-29 MED ORDER — BELLADONNA ALKALOIDS-OPIUM 16.2-60 MG RE SUPP
RECTAL | Status: AC
  Filled 2014-01-29: qty 1

## 2014-01-29 MED ORDER — GENTAMICIN SULFATE 40 MG/ML IJ SOLN
320.0000 mg | INTRAVENOUS | Status: AC
  Administered 2014-01-29: 320 mg via INTRAVENOUS
  Filled 2014-01-29: qty 8

## 2014-01-29 MED ORDER — LACTATED RINGERS IV SOLN
INTRAVENOUS | Status: DC
  Administered 2014-01-29: 07:00:00 via INTRAVENOUS
  Filled 2014-01-29: qty 1000

## 2014-01-29 MED ORDER — ACETAMINOPHEN 10 MG/ML IV SOLN
INTRAVENOUS | Status: DC | PRN
  Administered 2014-01-29: 1000 mg via INTRAVENOUS

## 2014-01-29 MED ORDER — HYDROCODONE-ACETAMINOPHEN 5-325 MG PO TABS
1.0000 | ORAL_TABLET | Freq: Four times a day (QID) | ORAL | Status: DC | PRN
  Administered 2014-01-29: 1 via ORAL
  Filled 2014-01-29: qty 1

## 2014-01-29 MED ORDER — LIDOCAINE HCL (CARDIAC) 20 MG/ML IV SOLN
INTRAVENOUS | Status: DC | PRN
  Administered 2014-01-29: 50 mg via INTRAVENOUS

## 2014-01-29 MED ORDER — ONDANSETRON HCL 4 MG/2ML IJ SOLN
INTRAMUSCULAR | Status: DC | PRN
  Administered 2014-01-29: 4 mg via INTRAVENOUS

## 2014-01-29 MED ORDER — HYDROCODONE-ACETAMINOPHEN 5-325 MG PO TABS
ORAL_TABLET | ORAL | Status: AC
  Filled 2014-01-29: qty 1

## 2014-01-29 MED ORDER — HYDROCODONE-ACETAMINOPHEN 5-325 MG PO TABS: 1.0000 | ORAL_TABLET | Freq: Four times a day (QID) | ORAL | Status: DC | PRN

## 2014-01-29 MED ORDER — CEFAZOLIN SODIUM-DEXTROSE 2-3 GM-% IV SOLR
2.0000 g | INTRAVENOUS | Status: AC
  Administered 2014-01-29: 2 g via INTRAVENOUS
  Filled 2014-01-29: qty 50

## 2014-01-29 SURGICAL SUPPLY — 52 items
BAG DRAIN URO-CYSTO SKYTR STRL (DRAIN) ×4 IMPLANT
BAG URINE DRAINAGE (UROLOGICAL SUPPLIES) IMPLANT
BAG URINE LEG 19OZ MD ST LTX (BAG) IMPLANT
BAG URINE LEG 500ML (DRAIN) ×4 IMPLANT
BALLN NEPHROSTOMY (BALLOONS)
BALLOON NEPHROSTOMY (BALLOONS) IMPLANT
CANISTER SUCT LVC 12 LTR MEDI- (MISCELLANEOUS) ×8 IMPLANT
CATH FOLEY 2WAY SLVR  5CC 16FR (CATHETERS)
CATH FOLEY 2WAY SLVR  5CC 18FR (CATHETERS)
CATH FOLEY 2WAY SLVR  5CC 20FR (CATHETERS) ×2
CATH FOLEY 2WAY SLVR  5CC 22FR (CATHETERS)
CATH FOLEY 2WAY SLVR 5CC 16FR (CATHETERS) IMPLANT
CATH FOLEY 2WAY SLVR 5CC 18FR (CATHETERS) IMPLANT
CATH FOLEY 2WAY SLVR 5CC 20FR (CATHETERS) ×2 IMPLANT
CATH FOLEY 2WAY SLVR 5CC 22FR (CATHETERS) IMPLANT
CATH FOLEY 3WAY 30CC 22F (CATHETERS) IMPLANT
CLOTH BEACON ORANGE TIMEOUT ST (SAFETY) ×4 IMPLANT
DRAPE CAMERA CLOSED 9X96 (DRAPES) ×4 IMPLANT
ELECT BUTTON BIOP 24F 90D PLAS (MISCELLANEOUS) IMPLANT
ELECT BUTTON HF 24-28F 2 30DE (ELECTRODE) ×4 IMPLANT
ELECT LOOP HF 26F 30D .35MM (CUTTING LOOP) IMPLANT
ELECT LOOP MED HF 24F 12D CBL (CLIP) ×4 IMPLANT
ELECT NEEDLE 45D HF 24-28F 12D (CUTTING LOOP) IMPLANT
ELECT REM PT RETURN 9FT ADLT (ELECTROSURGICAL)
ELECTRODE REM PT RTRN 9FT ADLT (ELECTROSURGICAL) IMPLANT
EVACUATOR MICROVAS BLADDER (UROLOGICAL SUPPLIES) IMPLANT
GLOVE BIO SURGEON STRL SZ 6.5 (GLOVE) ×3 IMPLANT
GLOVE BIO SURGEON STRL SZ7.5 (GLOVE) ×4 IMPLANT
GLOVE BIO SURGEONS STRL SZ 6.5 (GLOVE) ×1
GLOVE BIOGEL PI IND STRL 6.5 (GLOVE) ×2 IMPLANT
GLOVE BIOGEL PI INDICATOR 6.5 (GLOVE) ×2
GOWN STRL REIN XL XLG (GOWN DISPOSABLE) IMPLANT
GOWN STRL REUS W/TWL LRG LVL3 (GOWN DISPOSABLE) ×4 IMPLANT
GOWN STRL REUS W/TWL XL LVL3 (GOWN DISPOSABLE) ×4 IMPLANT
GUIDEWIRE STR DUAL SENSOR (WIRE) IMPLANT
HOLDER FOLEY CATH W/STRAP (MISCELLANEOUS) IMPLANT
IV NS 1000ML (IV SOLUTION) ×4
IV NS 1000ML BAXH (IV SOLUTION) ×4 IMPLANT
IV NS IRRIG 3000ML ARTHROMATIC (IV SOLUTION) ×4 IMPLANT
LOOP CUTTING 24FR OLYMPUS (CUTTING LOOP) IMPLANT
NDL SAFETY ECLIPSE 18X1.5 (NEEDLE) IMPLANT
NEEDLE HYPO 18GX1.5 SHARP (NEEDLE)
NEEDLE HYPO 22GX1.5 SAFETY (NEEDLE) IMPLANT
NS IRRIG 500ML POUR BTL (IV SOLUTION) IMPLANT
PACK CYSTOSCOPY (CUSTOM PROCEDURE TRAY) ×4 IMPLANT
PLUG CATH AND CAP STER (CATHETERS) IMPLANT
SET ASPIRATION TUBING (TUBING) IMPLANT
SYR 20CC LL (SYRINGE) IMPLANT
SYR 30ML LL (SYRINGE) IMPLANT
SYRINGE IRR TOOMEY STRL 70CC (SYRINGE) IMPLANT
WATER STERILE IRR 3000ML UROMA (IV SOLUTION) IMPLANT
WATER STERILE IRR 500ML POUR (IV SOLUTION) ×4 IMPLANT

## 2014-01-29 NOTE — Anesthesia Procedure Notes (Signed)
Procedure Name: LMA Insertion Date/Time: 01/29/2014 9:06 AM Performed by: Bethena Roys T Pre-anesthesia Checklist: Patient identified, Emergency Drugs available, Suction available and Patient being monitored Patient Re-evaluated:Patient Re-evaluated prior to inductionOxygen Delivery Method: Circle System Utilized Preoxygenation: Pre-oxygenation with 100% oxygen Intubation Type: IV induction Ventilation: Mask ventilation without difficulty LMA: LMA inserted LMA Size: 4.0 Number of attempts: 1 Airway Equipment and Method: bite block Placement Confirmation: positive ETCO2 Dental Injury: Teeth and Oropharynx as per pre-operative assessment

## 2014-01-29 NOTE — Anesthesia Postprocedure Evaluation (Signed)
  Anesthesia Post-op Note  Patient: [name] Johnston  Procedure(s) Performed: Procedure(s) (LRB): CYSTOSCOPY WITH URETHRAL DILATATION (N/A) TRANSURETHRAL RESECTION OF THE PROSTATE (TURP) (N/A)  Patient Location: PACU  Anesthesia Type: General  Level of Consciousness: awake and alert   Airway and Oxygen Therapy: Patient Spontanous Breathing  Post-op Pain: mild  Post-op Assessment: Post-op Vital signs reviewed, Patient's Cardiovascular Status Stable, Respiratory Function Stable, Patent Airway and No signs of Nausea or vomiting  Last Vitals:  Filed Vitals:   01/29/14 1210  BP: 102/69  Pulse: 82  Temp: 36.1 C  Resp: 14    Post-op Vital Signs: stable   Complications: No apparent anesthesia complications

## 2014-01-29 NOTE — Discharge Instructions (Signed)
Post transurethral resection of the prostate (TURP) instructions ° °Your recent prostate surgery requires very special post hospital care. Despite the fact that no skin incisions were used the area around the prostate incision is quite raw and is covered with a scab to promote healing and prevent bleeding. Certain cautions are needed to assure that the scab is not disturbed of the next 2-3 weeks while the healing proceeds. ° °Because the raw surface in your prostate and the irritating effects of urine you may expect frequency of urination and/or urgency (a stronger desire to urinate) and perhaps even getting up at night more often. This will usually resolve or improve slowly over the healing period. You may see some blood in your urine over the first 6 weeks. Do not be alarmed, even if the urine was clear for a while. Get off your feet and drink lots of fluids until clearing occurs. If you start to pass clots or don't improve call us. ° °Catheter: (If you are discharged with a catheter.) °1. Keep your catheter secured to your leg at all times with tape or the supplied strap. °2. You may experience leakage of urine around your catheter- as long as the  °catheter continues to drain, this is normal.  If your catheter stops draining  °go to the ER. °3. You may also have blood in your urine, even after it has been clear for  °several days; you may even pass some small blood clots or other material.  This  °is normal as well.  If this happens, sit down and drink plenty of water to help  °make urine to flush out your bladder.  If the blood in your urine becomes worse  °after doing this, contact our office or return to the ER. °4. You may use the leg bag (small bag) during the day, but use the large bag at  °night. ° °Diet: ° °You may return to your normal diet immediately. Because of the raw surface of your bladder, alcohol, spicy foods, foods high in acid and drinks with caffeine may cause irritation or frequency and  should be used in moderation. To keep your urine flowing freely and avoid constipation, drink plenty of fluids during the day (8-10 glasses). Tip: Avoid cranberry juice because it is very acidic. ° °Activity: ° °Your physical activity doesn't need to be restricted. However, if you are very active, you may see some blood in the urine. We suggest that you reduce your activity under the circumstances until the bleeding has stopped. ° °Bowels: ° °It is important to keep your bowels regular during the postoperative period. Straining with bowel movements can cause bleeding. A bowel movement every other day is reasonable. Use a mild laxative if needed, such as milk of magnesia 2-3 tablespoons, or 2 Dulcolax tablets. Call if you continue to have problems. If you had been taking narcotics for pain, before, during or after your surgery, you may be constipated. Take a laxative if necessary. ° °Medication: ° °You should resume your pre-surgery medications unless told not to. DO NOT RESUME YOUR ASPIRIN, WARFARIN, OR OTHER BLOOD THINNER FOR 1 WEEK. In addition you may be given an antibiotic to prevent or treat infection. Antibiotics are not always necessary. All medication should be taken as prescribed until the bottles are finished unless you are having an unusual reaction to one of the drugs. ° ° ° ° °Problems you should report to us: ° °a. Fever greater than 101°F. °b. Heavy bleeding, or clots (see   notes above about blood in urine). °c. Inability to urinate. °d. Drug reactions (hives, rash, nausea, vomiting, diarrhea). °e. Severe burning or pain with urination that is not improving. ° ° °Post Anesthesia Home Care Instructions ° °Activity: °Get plenty of rest for the remainder of the day. A responsible adult should stay with you for 24 hours following the procedure.  °For the next 24 hours, DO NOT: °-Drive a car °-Operate machinery °-Drink alcoholic beverages °-Take any medication unless instructed by your physician °-Make any  legal decisions or sign important papers. ° °Meals: °Start with liquid foods such as gelatin or soup. Progress to regular foods as tolerated. Avoid greasy, spicy, heavy foods. If nausea and/or vomiting occur, drink only clear liquids until the nausea and/or vomiting subsides. Call your physician if vomiting continues. ° °Special Instructions/Symptoms: °Your throat may feel dry or sore from the anesthesia or the breathing tube placed in your throat during surgery. If this causes discomfort, gargle with warm salt water. The discomfort should disappear within 24 hours. ° °

## 2014-01-29 NOTE — Anesthesia Preprocedure Evaluation (Addendum)
Anesthesia Evaluation  Patient identified by MRN, date of birth, ID band Patient awake    Reviewed: Allergy & Precautions, H&P , NPO status , Patient's Chart, lab work & pertinent test results  Airway Mallampati: II TM Distance: >3 FB Neck ROM: Full    Dental no notable dental hx.    Pulmonary asthma ,  breath sounds clear to auscultation  Pulmonary exam normal       Cardiovascular Exercise Tolerance: Good negative cardio ROS  Rhythm:Regular Rate:Normal     Neuro/Psych H/O chronic subdural headaches s/p burr holes. negative psych ROS   GI/Hepatic negative GI ROS, Neg liver ROS,   Endo/Other  negative endocrine ROS  Renal/GU negative Renal ROS  negative genitourinary   Musculoskeletal negative musculoskeletal ROS (+)   Abdominal   Peds negative pediatric ROS (+)  Hematology negative hematology ROS (+)   Anesthesia Other Findings   Reproductive/Obstetrics negative OB ROS                          Anesthesia Physical Anesthesia Plan  ASA: II  Anesthesia Plan: General   Post-op Pain Management:    Induction: Intravenous  Airway Management Planned: LMA  Additional Equipment:   Intra-op Plan:   Post-operative Plan: Extubation in OR  Informed Consent: I have reviewed the patients History and Physical, chart, labs and discussed the procedure including the risks, benefits and alternatives for the proposed anesthesia with the patient or authorized representative who has indicated his/her understanding and acceptance.   Dental advisory given  Plan Discussed with: CRNA  Anesthesia Plan Comments: (Discussed through professional interpreter. Questions answered.)       Anesthesia Quick Evaluation

## 2014-01-29 NOTE — Transfer of Care (Signed)
Immediate Anesthesia Transfer of Care Note  Patient: [name] Johnston  Procedure(s) Performed: Procedure(s): CYSTOSCOPY WITH URETHRAL DILATATION (N/A) TRANSURETHRAL RESECTION OF THE PROSTATE (TURP) (N/A)  Patient Location: PACU  Anesthesia Type:General  Level of Consciousness: awake and oriented  Airway & Oxygen Therapy: Patient Spontanous Breathing and Patient connected to nasal cannula oxygen  Post-op Assessment: Report given to PACU RN  Post vital signs: Reviewed and stable  Complications: No apparent anesthesia complications

## 2014-01-29 NOTE — Interval H&P Note (Signed)
History and Physical Interval Note:  01/29/2014 8:51 AM  [name] Johnston  has presented today for surgery, with the diagnosis of Prostate Cancer, Possible Urethral Stricture  The various methods of treatment have been discussed with the patient and family. After consideration of risks, benefits and other options for treatment, the patient has consented to  Procedure(s) with comments: CYSTOSCOPY WITH URETHRAL DILATATION (N/A) TRANSURETHRAL RESECTION OF THE PROSTATE (TURP) (N/A) - POSSIBLE  as a surgical intervention .  The patient's history has been reviewed, patient examined, no change in status, stable for surgery.  I have reviewed the patient's chart and labs.  Questions were answered to the patient's satisfaction.     Suleyma Wafer S

## 2014-01-29 NOTE — Op Note (Signed)
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  Anesthesia: Gen.  Indications: Patient initially presented with locally advanced prostate cancer. He had significant and progressive voiding symptoms and a PSA of approximately 40. CT imaging revealed a mass emanating off his prostatic urethra and bladder neck. Approximately 10 months ago he was taken to surgery where he was noted to have a locally advanced prostate cancer. Final pathology was Gleason's 4+4 equals 8 cancer that appeared to be high volume. There was no evidence of gross metastatic disease on imaging. The patient was started on hormonal therapy and subsequently received radiation therapy. His PSA has responded nicely. The patient recently developed recurrent urinary retention. He recently developed increased voiding difficulties. He did have a positive urine culture but despite therapy continued to have ongoing issues and presented to see me recently back in urinary retention. He presents now for endoscopic reassessment and consideration for summary resection of tissue to attempt to allow for spontaneous voiding.     Technique and findings: Patient was brought the operating room. He was induced with general anesthesia and LMA airway. He received perioperative Ancef and gentamicin. He was placed in lithotomy position and prepped and draped in usual manner. Appropriate surgical timeout was performed. A 20 French continuous flow resectoscope was inserted. There was no evidence of urethral stricture disease. The patient did have a relatively short prostatic urethra. There was evidence of some bladder neck obstruction or merely at that level of the bladder neck. There was no evidence of a bladder neck contracture. The bladder itself appeared to be very low capacity. There was diffuse mucosal urethane mouth and  edema more consistent with inflammation and malignancy. Utilizing the gyrus instrumentation and saline irrigation additional tissue was resected at the bladder neck mostly anteriorly and also the left lateral aspect of the bladder neck. At the completion of the procedure there did not appear to be any ongoing obvious visual obstruction. Hemostasis was quite good. A 20 French Foley catheter was inserted and urine was light pink. Patient was brought to recovery room stable condition having had no obvious complications.

## 2014-01-30 ENCOUNTER — Encounter (HOSPITAL_BASED_OUTPATIENT_CLINIC_OR_DEPARTMENT_OTHER): Payer: Self-pay | Admitting: Urology

## 2014-11-01 ENCOUNTER — Other Ambulatory Visit (HOSPITAL_COMMUNITY): Payer: Self-pay | Admitting: Urology

## 2014-11-01 DIAGNOSIS — C61 Malignant neoplasm of prostate: Secondary | ICD-10-CM

## 2014-11-02 ENCOUNTER — Telehealth: Payer: Self-pay | Admitting: Oncology

## 2014-11-02 NOTE — Telephone Encounter (Signed)
S/W PATIENT SON SIMON AND GAVE NP APPT FOR 11/19 @ 1:30 W/DR. SHADAD.  [facility] [facility] MAILED.

## 2014-11-08 ENCOUNTER — Ambulatory Visit: Payer: PRIVATE HEALTH INSURANCE

## 2014-11-08 ENCOUNTER — Ambulatory Visit (HOSPITAL_BASED_OUTPATIENT_CLINIC_OR_DEPARTMENT_OTHER): Payer: PRIVATE HEALTH INSURANCE | Admitting: Oncology

## 2014-11-08 ENCOUNTER — Encounter: Payer: Self-pay | Admitting: Oncology

## 2014-11-08 ENCOUNTER — Other Ambulatory Visit: Payer: PRIVATE HEALTH INSURANCE

## 2014-11-08 ENCOUNTER — Telehealth: Payer: Self-pay | Admitting: Oncology

## 2014-11-08 VITALS — BP 136/80 | HR 74 | Temp 98.7°F | Resp 18 | Ht 63.0 in | Wt 142.6 lb

## 2014-11-08 DIAGNOSIS — C61 Malignant neoplasm of prostate: Secondary | ICD-10-CM

## 2014-11-08 DIAGNOSIS — E291 Testicular hypofunction: Secondary | ICD-10-CM

## 2014-11-08 NOTE — Telephone Encounter (Signed)
Gave avs & cal for Dec. Also contrast given to pt for Ct scan.

## 2014-11-08 NOTE — Progress Notes (Signed)
Please see consult note.  
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
## 2014-11-08 NOTE — Progress Notes (Signed)
Checked in new pt with no financial concerns. °

## 2014-11-13 ENCOUNTER — Other Ambulatory Visit: Payer: Self-pay | Admitting: Medical Oncology

## 2014-11-13 ENCOUNTER — Telehealth: Payer: Self-pay | Admitting: Oncology

## 2014-11-13 DIAGNOSIS — C61 Malignant neoplasm of prostate: Secondary | ICD-10-CM

## 2014-11-13 NOTE — Progress Notes (Signed)
Per CT, patient needs labs prior to CT scan 11/25. Per MD, labs ordered, POF sent.  Schedulers to contact pt for lab appt.

## 2014-11-13 NOTE — Telephone Encounter (Signed)
Lft msg for pt confirming labs added on for 11/25 followed by CT Scan, called pt's daughter s/w her and she confirmed labs for 8 am on 11/25 per 11/24 POF..... KJ

## 2014-11-14 ENCOUNTER — Encounter (HOSPITAL_COMMUNITY): Payer: Self-pay

## 2014-11-14 ENCOUNTER — Other Ambulatory Visit (HOSPITAL_BASED_OUTPATIENT_CLINIC_OR_DEPARTMENT_OTHER): Payer: PRIVATE HEALTH INSURANCE

## 2014-11-14 ENCOUNTER — Encounter (HOSPITAL_COMMUNITY)
Admission: RE | Admit: 2014-11-14 | Discharge: 2014-11-14 | Disposition: A | Discharge status: Home / Self Care | Payer: PRIVATE HEALTH INSURANCE | Source: Ambulatory Visit | Attending: Diagnostic Radiology | Admitting: Diagnostic Radiology

## 2014-11-14 ENCOUNTER — Ambulatory Visit (HOSPITAL_COMMUNITY)
Admission: RE | Admit: 2014-11-14 | Discharge: 2014-11-14 | Disposition: A | Discharge status: Home / Self Care | Payer: PRIVATE HEALTH INSURANCE | Source: Ambulatory Visit | Attending: Oncology | Admitting: Oncology

## 2014-11-14 ENCOUNTER — Encounter (HOSPITAL_COMMUNITY): Payer: PRIVATE HEALTH INSURANCE

## 2014-11-14 DIAGNOSIS — K429 Umbilical hernia without obstruction or gangrene: Secondary | ICD-10-CM | POA: Diagnosis not present

## 2014-11-14 DIAGNOSIS — K769 Liver disease, unspecified: Secondary | ICD-10-CM | POA: Insufficient documentation

## 2014-11-14 DIAGNOSIS — K4090 Unilateral inguinal hernia, without obstruction or gangrene, not specified as recurrent: Secondary | ICD-10-CM | POA: Insufficient documentation

## 2014-11-14 DIAGNOSIS — J45909 Unspecified asthma, uncomplicated: Secondary | ICD-10-CM | POA: Insufficient documentation

## 2014-11-14 DIAGNOSIS — Z8546 Personal history of malignant neoplasm of prostate: Secondary | ICD-10-CM | POA: Diagnosis not present

## 2014-11-14 DIAGNOSIS — R109 Unspecified abdominal pain: Secondary | ICD-10-CM | POA: Insufficient documentation

## 2014-11-14 DIAGNOSIS — C61 Malignant neoplasm of prostate: Secondary | ICD-10-CM

## 2014-11-14 DIAGNOSIS — Z8551 Personal history of malignant neoplasm of bladder: Secondary | ICD-10-CM | POA: Diagnosis not present

## 2014-11-14 DIAGNOSIS — R918 Other nonspecific abnormal finding of lung field: Secondary | ICD-10-CM | POA: Diagnosis present

## 2014-11-14 LAB — COMPREHENSIVE METABOLIC PANEL (CC13)
ALK PHOS: 102 U/L (ref 40–150)
ALT: 29 U/L (ref 0–55)
AST: 20 U/L (ref 5–34)
Albumin: 4.2 g/dL (ref 3.5–5.0)
Anion Gap: 10 mEq/L (ref 3–11)
BILIRUBIN TOTAL: 0.55 mg/dL (ref 0.20–1.20)
BUN: 11.8 mg/dL (ref 7.0–26.0)
CO2: 28 mEq/L (ref 22–29)
Calcium: 10 mg/dL (ref 8.4–10.4)
Chloride: 103 mEq/L (ref 98–109)
Creatinine: 1 mg/dL (ref 0.7–1.3)
GLUCOSE: 107 mg/dL (ref 70–140)
Potassium: 4.4 mEq/L (ref 3.5–5.1)
SODIUM: 142 meq/L (ref 136–145)
TOTAL PROTEIN: 8 g/dL (ref 6.4–8.3)

## 2014-11-14 LAB — CBC WITH DIFFERENTIAL/PLATELET
BASO%: 0.5 % (ref 0.0–2.0)
Basophils Absolute: 0 10*3/uL (ref 0.0–0.1)
EOS%: 5.1 % (ref 0.0–7.0)
Eosinophils Absolute: 0.2 10*3/uL (ref 0.0–0.5)
HCT: 39.9 % (ref 38.4–49.9)
HGB: 13.2 g/dL (ref 13.0–17.1)
LYMPH%: 23.9 % (ref 14.0–49.0)
MCH: 26.5 pg — ABNORMAL LOW (ref 27.2–33.4)
MCHC: 33.1 g/dL (ref 32.0–36.0)
MCV: 80.1 fL (ref 79.3–98.0)
MONO#: 0.2 10*3/uL (ref 0.1–0.9)
MONO%: 6.2 % (ref 0.0–14.0)
NEUT#: 2.4 10*3/uL (ref 1.5–6.5)
NEUT%: 64.3 % (ref 39.0–75.0)
Platelets: 212 10*3/uL (ref 140–400)
RBC: 4.98 10*6/uL (ref 4.20–5.82)
RDW: 13.3 % (ref 11.0–14.6)
WBC: 3.7 10*3/uL — ABNORMAL LOW (ref 4.0–10.3)
lymph#: 0.9 10*3/uL (ref 0.9–3.3)

## 2014-11-14 MED ORDER — IOHEXOL 300 MG/ML  SOLN
50.0000 mL | Freq: Once | INTRAMUSCULAR | Status: AC | PRN
  Administered 2014-11-14: 50 mL via ORAL

## 2014-11-14 MED ORDER — TECHNETIUM TC 99M MEDRONATE IV KIT
25.0000 | PACK | Freq: Once | INTRAVENOUS | Status: AC | PRN
  Administered 2014-11-14: 27 via INTRAVENOUS

## 2014-11-14 MED ORDER — IOHEXOL 300 MG/ML  SOLN
100.0000 mL | Freq: Once | INTRAMUSCULAR | Status: AC | PRN
  Administered 2014-11-14: 100 mL via INTRAVENOUS

## 2014-11-21 ENCOUNTER — Other Ambulatory Visit: Payer: Self-pay | Admitting: *Deleted

## 2014-11-21 ENCOUNTER — Telehealth: Payer: Self-pay | Admitting: Oncology

## 2014-11-21 ENCOUNTER — Ambulatory Visit (HOSPITAL_BASED_OUTPATIENT_CLINIC_OR_DEPARTMENT_OTHER): Payer: PRIVATE HEALTH INSURANCE | Admitting: Oncology

## 2014-11-21 VITALS — BP 133/67 | HR 74 | Temp 98.0°F | Resp 18 | Ht 63.0 in | Wt 145.1 lb

## 2014-11-21 DIAGNOSIS — R160 Hepatomegaly, not elsewhere classified: Secondary | ICD-10-CM

## 2014-11-21 DIAGNOSIS — C61 Malignant neoplasm of prostate: Secondary | ICD-10-CM

## 2014-11-21 MED ORDER — BICALUTAMIDE 50 MG PO TABS: 50.0000 mg | ORAL_TABLET | Freq: Every day | ORAL | Status: DC

## 2014-11-21 NOTE — Telephone Encounter (Signed)
Gave avs & cal for Dec/Jan. °

## 2014-11-21 NOTE — Progress Notes (Signed)
Hematology and Oncology Follow Up Visit  [name] Johnston [id number] [birth year] 63 y.o. 11/21/2014 4:27 PM Default, Provider, MDNo ref. provider found   Principle Diagnosis: 63 year old gentleman with prostate cancer diagnosed in 2014. He presented with obstructive symptoms and found to have a Gleason score 4+4 = 8 prostate cancer with PSA of around 40.    Prior Therapy: The patient was started on Firmagon on 06/27/2013. He subsequently received definitive radiation therapy for a planned dose of 75 gray completed in November 2014 under the care of Dr. Tammi Klippel. His PSA subsequently noted close to 0.33. Most recently his PSA was up to 11 on 10/29/2014 with a castrate levels of testosterone of 19.   Current therapy: Under evaluation for second line or combined androgen deprivation.  Interim History:  Mr. Reindl presents today for a follow-up visit. Since his last visit, he completed a bone scan and CT scan without any complications. He does not report any new complaints at this time. He does report early satiety and occasional abdominal distention. He does not report any jaundice or change in his bowel habits. He reports dysuria and occasional hematuria. He is very active and performs activities of daily living. He does not report any headaches or blurry vision or syncope. He does not report any fevers, chills, sweats or weight loss. His appetite is reasonable without any reflux but does report occasional bloating. He does not report any frequency urgency but does report dysuria and occasional hematuria. He does not report any skeletal complaints of back pain shoulder pain or hip pain. He does not report any lymphadenopathy or petechiae. Rest of his review of systems unremarkable.   Medications: I have reviewed the patient's current medications.  Current Outpatient Prescriptions  Medication Sig Dispense Refill  . acetaminophen (TYLENOL) 325 MG tablet Take 650 mg by mouth every 6 (six) hours as needed for pain.     Marland Kitchen albuterol (PROVENTIL HFA;VENTOLIN HFA) 108 (90 BASE) MCG/ACT inhaler Inhale 1-2 puffs into the lungs every 4 (four) hours as needed for wheezing.     . bicalutamide (CASODEX) 50 MG tablet Take 1 tablet (50 mg total) by mouth daily. 90 tablet 3  . HYDROcodone-acetaminophen (NORCO/VICODIN) 5-325 MG per tablet Take 1-2 tablets by mouth every 6 (six) hours as needed. 20 tablet 0  . phenazopyridine (PYRIDIUM) 200 MG tablet Take 1 tablet (200 mg total) by mouth 3 (three) times daily as needed for pain (dysuria). 10 tablet 0  . solifenacin (VESICARE) 10 MG tablet Take 1 tablet (10 mg total) by mouth daily. 30 tablet 6   No current facility-administered medications for this visit.     Allergies:  Allergies  Allergen Reactions  . Aspirin Hives    Past Medical History, Surgical history, Social history, and Family History were reviewed and updated.   Remaining ROS negative. Physical Exam: Blood pressure 133/67, pulse 74, temperature 98 F (36.7 C), temperature source Oral, resp. rate 18, height 5\' 3"  (1.6 m), weight 145 lb 1.6 oz (65.817 kg). ECOG: 1 General appearance: alert and cooperative Head: Normocephalic, without obvious abnormality Neck: no adenopathy Lymph nodes: Cervical, supraclavicular, and axillary nodes normal. Heart:regular rate and rhythm, S1, S2 normal, no murmur, click, rub or gallop Lung:chest clear, no wheezing, rales, normal symmetric air entry Abdomin: soft, non-tender, without masses or organomegaly EXT:no erythema, induration, or nodules   Lab Results: Lab Results  Component Value Date   WBC 3.7* 11/14/2014   HGB 13.2 11/14/2014   HCT 39.9 11/14/2014   MCV 80.1  11/14/2014   PLT 212 11/14/2014     Chemistry      Component Value Date/Time   NA 142 11/14/2014 0806   NA 143 01/29/2014 0708   K 4.4 11/14/2014 0806   K 3.6* 01/29/2014 0708   CL 101 01/29/2014 0708   CO2 28 11/14/2014 0806   CO2 26 01/02/2014 1724   BUN 11.8 11/14/2014 0806   BUN 18  01/29/2014 0708   CREATININE 1.0 11/14/2014 0806   CREATININE 1.00 01/29/2014 0708      Component Value Date/Time   CALCIUM 10.0 11/14/2014 0806   CALCIUM 9.0 01/02/2014 1724   ALKPHOS 102 11/14/2014 0806   ALKPHOS 72 08/06/2010 0540   AST 20 11/14/2014 0806   AST 17 08/06/2010 0540   ALT 29 11/14/2014 0806   ALT 15 08/06/2010 0540   BILITOT 0.55 11/14/2014 0806   BILITOT 0.8 08/06/2010 0540       Radiological Studies:   CLINICAL DATA: Prostate cancer 2012 post radiation therapy, with elevated PSA, constipation, abdominal pain and distention, personal history of bladder cancer, asthma  EXAM: CT ABDOMEN AND PELVIS WITH CONTRAST  TECHNIQUE: Multidetector CT imaging of the abdomen and pelvis was performed using the standard protocol following bolus administration of intravenous contrast. Sagittal and coronal MPR images reconstructed from axial data set.  CONTRAST: 131mL OMNIPAQUE IOHEXOL 300 MG/ML SOLN IV. Dilute oral contrast.  COMPARISON: None  FINDINGS: 8 x 6 mm noncalcified RIGHT middle lobe nodule image 1, incompletely visualized.  Questionable 4 mm LEFT lower lobe nodule image 8.  Poorly identified low-attenuation mass with question adjacent enhancement superiorly centrally within the LEFT lobe of the liver, 2.4 x 2.4 x 3.0 cm, indeterminate, not within the imaged field of view on delayed renal images.  Minimal fatty infiltration of liver.  Tiny LEFT renal cyst.  Additional low-attenuation mass lesion posterior aspect of RIGHT lobe 17 x 11 x 20 mm image 14.  Remainder of liver, spleen, pancreas, kidneys, and adrenal glands normal.  Minimal scattered atherosclerotic calcification  Normal appendix.  Bladder and ureters normal appearance.  LEFT inguinal hernia containing fat.  Surgical clips post prostate surgery.  Small umbilical hernia containing fat.  No additional mass, adenopathy, free fluid, free air, or  acute inflammatory process.  Bones diffusely demineralized without focal metastatic lesion identified.  IMPRESSION: Nonspecific bibasilar pulmonary nodules largest 8 x 6 mm RIGHT middle lobe ; recommend followup assessment in 3-6 months.  Two the probable mass lesions within liver, 2.4 x 2.4 x 3.0 cm and 1.7 x 1.1 x 2.0 cm ; these are indeterminate and require characterization by MR imaging with and without contrast.  LEFT inguinal hernia and tiny umbilical hernia, both containing fat.  No other definite intra-abdominal or intrapelvic abnormalities.   EXAM: NUCLEAR MEDICINE WHOLE BODY BONE SCAN  TECHNIQUE: Whole body anterior and posterior images were obtained approximately 3 hours after intravenous injection of radiopharmaceutical.  RADIOPHARMACEUTICALS: 27.0 mCi Technetium-99 MDP  COMPARISON: 06/21/2013.  FINDINGS: Bilateral renal function and excretion. No focal bony abnormalities identified. No evidence of metastatic disease.  IMPRESSION: Negative exam.   Impression and Plan:  63 year old gentleman with the following issues:  1. Prostate cancer diagnosed in June 2014. He presented with obstructive symptoms and found to have a Gleason score 4+4 = 8 prostate cancer with PSA of around 40. He was treated with androgen deprivation and radiation therapy with an excellent initial response and a PSA nadir of 0.33. Most recently his PSA was up to 11 and  November 2015. Bone scan and CT scan obtained on 11/14/2014 were discussed today. I see no evidence of bulky disease at this time and I think is reasonable to add Casodex to his androgen deprivation and attempt to drop his PSA down. Risks and benefits of this medication were discussed. He is agreeable to proceed. I will check his PSA again in 6 weeks to evaluate for any response.  2. Liver masses: Unclear etiology at this time but unlikely to be prostate cancer metastasis. These could include new malignancy such as  hepatocellular carcinoma, also be benign findings. The plan at this point, I will obtain an MRI of the liver based on the recommendations of radiology I will also obtain an alpha-fetoprotein. Depending on these findings will determine the next step. Biopsy could be warranted if malignancy is suspected.  Zola Button, MD 12/2/20154:27 PM

## 2014-11-28 ENCOUNTER — Other Ambulatory Visit (HOSPITAL_BASED_OUTPATIENT_CLINIC_OR_DEPARTMENT_OTHER): Payer: PRIVATE HEALTH INSURANCE

## 2014-11-28 DIAGNOSIS — C61 Malignant neoplasm of prostate: Secondary | ICD-10-CM

## 2014-11-28 DIAGNOSIS — R160 Hepatomegaly, not elsewhere classified: Secondary | ICD-10-CM

## 2014-11-28 LAB — COMPREHENSIVE METABOLIC PANEL (CC13)
ALK PHOS: 97 U/L (ref 40–150)
ALT: 26 U/L (ref 0–55)
AST: 18 U/L (ref 5–34)
Albumin: 4 g/dL (ref 3.5–5.0)
Anion Gap: 12 mEq/L — ABNORMAL HIGH (ref 3–11)
BILIRUBIN TOTAL: 0.52 mg/dL (ref 0.20–1.20)
BUN: 13.6 mg/dL (ref 7.0–26.0)
CO2: 27 mEq/L (ref 22–29)
Calcium: 9.7 mg/dL (ref 8.4–10.4)
Chloride: 104 mEq/L (ref 98–109)
Creatinine: 1 mg/dL (ref 0.7–1.3)
EGFR: 75 mL/min/{1.73_m2} — AB (ref >90)
Glucose: 101 mg/dl (ref 70–140)
POTASSIUM: 3.7 meq/L (ref 3.5–5.1)
SODIUM: 143 meq/L (ref 136–145)
Total Protein: 7.6 g/dL (ref 6.4–8.3)

## 2014-11-29 LAB — AFP TUMOR MARKER-PREVIOUS METHOD: AFP Tumor Marker: 9.3 ng/mL — ABNORMAL HIGH (ref 0.0–8.0)

## 2014-11-29 LAB — PSA: PSA: 18.88 ng/mL — ABNORMAL HIGH (ref <=4.00)

## 2014-11-29 LAB — AFP TUMOR MARKER: AFP TUMOR MARKER: 7.9 ng/mL — AB (ref <6.1)

## 2014-12-03 ENCOUNTER — Ambulatory Visit (HOSPITAL_COMMUNITY)
Admission: RE | Admit: 2014-12-03 | Discharge: 2014-12-03 | Disposition: A | Discharge status: Home / Self Care | Payer: PRIVATE HEALTH INSURANCE | Source: Ambulatory Visit | Attending: Oncology | Admitting: Oncology

## 2014-12-03 DIAGNOSIS — C61 Malignant neoplasm of prostate: Secondary | ICD-10-CM | POA: Diagnosis not present

## 2014-12-03 DIAGNOSIS — C679 Malignant neoplasm of bladder, unspecified: Secondary | ICD-10-CM | POA: Diagnosis not present

## 2014-12-03 DIAGNOSIS — R160 Hepatomegaly, not elsewhere classified: Secondary | ICD-10-CM | POA: Insufficient documentation

## 2014-12-03 MED ORDER — GADOBENATE DIMEGLUMINE 529 MG/ML IV SOLN
15.0000 mL | Freq: Once | INTRAVENOUS | Status: AC | PRN
  Administered 2014-12-03: 13 mL via INTRAVENOUS

## 2015-01-01 ENCOUNTER — Ambulatory Visit (HOSPITAL_BASED_OUTPATIENT_CLINIC_OR_DEPARTMENT_OTHER): Payer: PPO | Admitting: Oncology

## 2015-01-01 ENCOUNTER — Telehealth: Payer: Self-pay | Admitting: Oncology

## 2015-01-01 ENCOUNTER — Other Ambulatory Visit (HOSPITAL_BASED_OUTPATIENT_CLINIC_OR_DEPARTMENT_OTHER): Payer: PPO

## 2015-01-01 VITALS — BP 127/79 | Temp 98.2°F | Resp 18 | Ht 63.0 in | Wt 144.0 lb

## 2015-01-01 DIAGNOSIS — K5900 Constipation, unspecified: Secondary | ICD-10-CM

## 2015-01-01 DIAGNOSIS — C61 Malignant neoplasm of prostate: Secondary | ICD-10-CM

## 2015-01-01 DIAGNOSIS — R21 Rash and other nonspecific skin eruption: Secondary | ICD-10-CM

## 2015-01-01 DIAGNOSIS — R160 Hepatomegaly, not elsewhere classified: Secondary | ICD-10-CM

## 2015-01-01 LAB — CBC WITH DIFFERENTIAL/PLATELET
BASO%: 0.9 % (ref 0.0–2.0)
Basophils Absolute: 0 10*3/uL (ref 0.0–0.1)
EOS%: 4.2 % (ref 0.0–7.0)
Eosinophils Absolute: 0.2 10*3/uL (ref 0.0–0.5)
HEMATOCRIT: 39.5 % (ref 38.4–49.9)
HGB: 12.9 g/dL — ABNORMAL LOW (ref 13.0–17.1)
LYMPH#: 0.9 10*3/uL (ref 0.9–3.3)
LYMPH%: 22.8 % (ref 14.0–49.0)
MCH: 26.4 pg — ABNORMAL LOW (ref 27.2–33.4)
MCHC: 32.7 g/dL (ref 32.0–36.0)
MCV: 80.7 fL (ref 79.3–98.0)
MONO#: 0.3 10*3/uL (ref 0.1–0.9)
MONO%: 7.5 % (ref 0.0–14.0)
NEUT#: 2.7 10*3/uL (ref 1.5–6.5)
NEUT%: 64.6 % (ref 39.0–75.0)
Platelets: 258 10*3/uL (ref 140–400)
RBC: 4.9 10*6/uL (ref 4.20–5.82)
RDW: 13.8 % (ref 11.0–14.6)
WBC: 4.2 10*3/uL (ref 4.0–10.3)

## 2015-01-01 LAB — COMPREHENSIVE METABOLIC PANEL (CC13)
ALK PHOS: 105 U/L (ref 40–150)
ALT: 21 U/L (ref 0–55)
AST: 20 U/L (ref 5–34)
Albumin: 4.4 g/dL (ref 3.5–5.0)
Anion Gap: 11 mEq/L (ref 3–11)
BUN: 12.9 mg/dL (ref 7.0–26.0)
CALCIUM: 9.4 mg/dL (ref 8.4–10.4)
CHLORIDE: 102 meq/L (ref 98–109)
CO2: 28 mEq/L (ref 22–29)
Creatinine: 1 mg/dL (ref 0.7–1.3)
EGFR: 81 mL/min/{1.73_m2} — ABNORMAL LOW (ref >90)
Glucose: 109 mg/dl (ref 70–140)
Potassium: 3.9 mEq/L (ref 3.5–5.1)
SODIUM: 141 meq/L (ref 136–145)
TOTAL PROTEIN: 8.2 g/dL (ref 6.4–8.3)
Total Bilirubin: 0.38 mg/dL (ref 0.20–1.20)

## 2015-01-01 MED ORDER — HYDROCORTISONE 1 % EX OINT: 1.0000 "application " | TOPICAL_OINTMENT | Freq: Two times a day (BID) | CUTANEOUS | Status: DC

## 2015-01-01 MED ORDER — POLYETHYLENE GLYCOL 3350 17 G PO PACK: 17.0000 g | PACK | Freq: Every day | ORAL | Status: DC

## 2015-01-01 MED ORDER — BICALUTAMIDE 50 MG PO TABS: 50.0000 mg | ORAL_TABLET | Freq: Every day | ORAL | Status: DC

## 2015-01-01 NOTE — Progress Notes (Signed)
Hematology and Oncology Follow Up Visit  [name] Johnston [id number] [birth year] 64 y.o. 01/01/2015 3:30 PM Default, Provider, MDNo ref. provider found   Principle Diagnosis: 65 year old gentleman with prostate cancer diagnosed in 2014. He presented with obstructive symptoms and found to have a Gleason score 4+4 = 8 prostate cancer with PSA of around 40.  Liver mass diagnosed in December 2015 with an elevated AFP of 9.3.  Prior Therapy: The patient was started on Firmagon on 06/27/2013. He subsequently received definitive radiation therapy for a planned dose of 75 gray completed in November 2014 under the care of [name] Johnston. His PSA subsequently noted close to 0.33. Most recently his PSA was up to 11 on 10/29/2014 with a castrate levels of testosterone of 19. Staging workup did not reveal any bulky metastasis.   Current therapy: Casodex added in December 2015.  Interim History:  [name] Johnston presents today for a follow-up visit. Since his last visit, he started Casodex without any complications. He does report chronic constipation and abdominal bloating associated with it. He also have pruritus any rash noted on his Botox and thigh area. These are chronic findings preceding the start of Casodex. He reports dysuria and occasional hematuria. He is active and performs activities of daily living. He does not report any headaches or blurry vision or syncope. He does not report any fevers, chills, sweats or weight loss. His appetite is reasonable without any reflux but does report occasional bloating. He does not report any frequency urgency but does report dysuria and occasional hematuria. He does not report any skeletal complaints of back pain shoulder pain or hip pain. He does not report any lymphadenopathy or petechiae. Rest of his review of systems unremarkable.   Medications: I have reviewed the patient's current medications.  Current Outpatient Prescriptions  Medication Sig Dispense Refill  . acetaminophen  (TYLENOL) 325 MG tablet Take 650 mg by mouth every 6 (six) hours as needed for pain.    Marland Kitchen albuterol (PROVENTIL HFA;VENTOLIN HFA) 108 (90 BASE) MCG/ACT inhaler Inhale 1-2 puffs into the lungs every 4 (four) hours as needed for wheezing.     . bicalutamide (CASODEX) 50 MG tablet Take 1 tablet (50 mg total) by mouth daily. 90 tablet 1  . HYDROcodone-acetaminophen (NORCO/VICODIN) 5-325 MG per tablet Take 1-2 tablets by mouth every 6 (six) hours as needed. 20 tablet 0  . phenazopyridine (PYRIDIUM) 200 MG tablet Take 1 tablet (200 mg total) by mouth 3 (three) times daily as needed for pain (dysuria). 10 tablet 0  . solifenacin (VESICARE) 10 MG tablet Take 1 tablet (10 mg total) by mouth daily. 30 tablet 6  . hydrocortisone 1 % ointment Apply 1 application topically 2 (two) times daily. 30 g 0  . polyethylene glycol (MIRALAX) packet Take 17 g by mouth daily. 14 each 3   No current facility-administered medications for this visit.     Allergies:  Allergies  Allergen Reactions  . Aspirin Hives    Past Medical History, Surgical history, Social history, and Family History were reviewed and updated.   Remaining ROS negative. Physical Exam: Blood pressure 127/79, temperature 98.2 F (36.8 C), temperature source Oral, resp. rate 18, height 5\' 3"  (1.6 m), weight 144 lb (65.318 kg), SpO2 100 %. ECOG: 1 General appearance: alert and cooperative Head: Normocephalic, without obvious abnormality Neck: no adenopathy Lymph nodes: Cervical, supraclavicular, and axillary nodes normal. Heart:regular rate and rhythm, S1, S2 normal, no murmur, click, rub or gallop Lung:chest clear, no wheezing, rales, normal symmetric air  entry Abdomin: soft, non-tender, without masses or organomegaly EXT:no erythema, induration, or nodules Skin: Raised plaque lesions noted on his thigh and Botox.  Lab Results: Lab Results  Component Value Date   WBC 4.2 01/01/2015   HGB 12.9* 01/01/2015   HCT 39.5 01/01/2015   MCV  80.7 01/01/2015   PLT 258 01/01/2015     Chemistry      Component Value Date/Time   NA 141 01/01/2015 1446   NA 143 01/29/2014 0708   K 3.9 01/01/2015 1446   K 3.6* 01/29/2014 0708   CL 101 01/29/2014 0708   CO2 28 01/01/2015 1446   CO2 26 01/02/2014 1724   BUN 12.9 01/01/2015 1446   BUN 18 01/29/2014 0708   CREATININE 1.0 01/01/2015 1446   CREATININE 1.00 01/29/2014 0708      Component Value Date/Time   CALCIUM 9.4 01/01/2015 1446   CALCIUM 9.0 01/02/2014 1724   ALKPHOS 105 01/01/2015 1446   ALKPHOS 72 08/06/2010 0540   AST 20 01/01/2015 1446   AST 17 08/06/2010 0540   ALT 21 01/01/2015 1446   ALT 15 08/06/2010 0540   BILITOT 0.38 01/01/2015 1446   BILITOT 0.8 08/06/2010 0540       EXAM: MRI ABDOMEN WITHOUT AND WITH CONTRAST  TECHNIQUE: Multiplanar multisequence MR imaging of the abdomen was performed both before and after the administration of intravenous contrast.  CONTRAST: 26mL MULTIHANCE GADOBENATE DIMEGLUMINE 529 MG/ML IV SOLN  COMPARISON: CT 11/14/2014  FINDINGS: There are 2 early enhancing lesions within the liver. Lesion in the central left hepatic lobe (segment 4A measures 32 x 27 mm image 23). Lesion in the posterior right hepatic lobe (segment 7 measures 28 x 26 mm). These lesions have uniform early enhanced is some washout of contrast with internal enhance septations. Lesions have intermediate high intensity on T2 weighted imaging and restricted diffusion. This combination of findings is most concerning for malignancy.  There is diffuse hepatic steatosis of the liver noted. No biliary duct dilatation. The gallbladder is normal. Common bile duct is normal.  The pancreas, spleen, adrenal glands, kidneys are normal. Nonenhancing benign cyst of the left kidney.  No retroperitoneal or periportal lymphadenopathy. No aggressive osseous lesion.  On the coronal image 80 there is a right middle lobe nodule measuring 7 mm (image 58, series  13).  IMPRESSION: 1. Single lesions within the left and right hepatic lobes have imaging characteristics most consistent with malignancy. Favor metastatic disease over primary liver neoplasm. 2. Hepatic steatosis. 3. No lymphadenopathy noted in the abdomen. 4. Right middle lobe pulmonary nodule.      Impression and Plan:  64 year old gentleman with the following issues:  1. Prostate cancer diagnosed in June 2014. He presented with obstructive symptoms and found to have a Gleason score 4+4 = 8 prostate cancer with PSA of around 40. He was treated with androgen deprivation and radiation therapy with an excellent initial response and a PSA nadir of 0.33. Most recently his PSA was up to 19 in 11/2014.  Bone scan and CT scan obtained on 11/14/2014 showed no evidence of bulky disease. He is currently on Casodex and tolerating it well and his PSA is currently pending. For the time being we'll keep him on the current medication unless there is a sign of cancer progression.  2. Liver masses: Differential diagnosis including hepatocellular carcinoma versus metastatic prostate cancer. He does have an elevated alpha-fetoprotein of 9.3 which is suspicious for a hepatocellular carcinoma. I will refer him to interventional radiology  for possible evaluation of liver directed therapy versus a biopsy.   3. Skin rash: Unclear etiology and I doubt related to Casodex. I gave him hydrocortisone cream and if there is no improvement, we'll consider dermatology referral.  4. Constipation: Regular prescription for MiraLAX to take daily for regular bowel movements.  5. Follow-up: Will be in 6 weeks to follow his progress.  Zola Button, MD 1/12/20163:30 PM

## 2015-01-01 NOTE — Telephone Encounter (Signed)
gv and printed appt sched and avs for pt for Feb  °

## 2015-01-02 LAB — PSA: PSA: 20.18 ng/mL — ABNORMAL HIGH (ref <=4.00)

## 2015-02-13 ENCOUNTER — Ambulatory Visit (HOSPITAL_BASED_OUTPATIENT_CLINIC_OR_DEPARTMENT_OTHER): Payer: PPO | Admitting: Oncology

## 2015-02-13 ENCOUNTER — Other Ambulatory Visit (HOSPITAL_BASED_OUTPATIENT_CLINIC_OR_DEPARTMENT_OTHER): Payer: PPO

## 2015-02-13 ENCOUNTER — Telehealth: Payer: Self-pay | Admitting: Oncology

## 2015-02-13 VITALS — BP 128/79 | HR 65 | Temp 98.0°F | Resp 18 | Ht 63.0 in | Wt 143.6 lb

## 2015-02-13 DIAGNOSIS — C61 Malignant neoplasm of prostate: Secondary | ICD-10-CM

## 2015-02-13 DIAGNOSIS — K5900 Constipation, unspecified: Secondary | ICD-10-CM

## 2015-02-13 DIAGNOSIS — Z7689 Persons encountering health services in other specified circumstances: Secondary | ICD-10-CM

## 2015-02-13 DIAGNOSIS — R160 Hepatomegaly, not elsewhere classified: Secondary | ICD-10-CM

## 2015-02-13 DIAGNOSIS — R21 Rash and other nonspecific skin eruption: Secondary | ICD-10-CM

## 2015-02-13 LAB — CBC WITH DIFFERENTIAL/PLATELET
BASO%: 0.5 % (ref 0.0–2.0)
Basophils Absolute: 0 10*3/uL (ref 0.0–0.1)
EOS%: 2.3 % (ref 0.0–7.0)
Eosinophils Absolute: 0.2 10*3/uL (ref 0.0–0.5)
HCT: 36.5 % — ABNORMAL LOW (ref 38.4–49.9)
HGB: 11.8 g/dL — ABNORMAL LOW (ref 13.0–17.1)
LYMPH%: 15.9 % (ref 14.0–49.0)
MCH: 25.9 pg — ABNORMAL LOW (ref 27.2–33.4)
MCHC: 32.3 g/dL (ref 32.0–36.0)
MCV: 80.1 fL (ref 79.3–98.0)
MONO#: 0.4 10*3/uL (ref 0.1–0.9)
MONO%: 5.7 % (ref 0.0–14.0)
NEUT#: 5.1 10*3/uL (ref 1.5–6.5)
NEUT%: 75.6 % — ABNORMAL HIGH (ref 39.0–75.0)
Platelets: 199 10*3/uL (ref 140–400)
RBC: 4.55 10*6/uL (ref 4.20–5.82)
RDW: 13.3 % (ref 11.0–14.6)
WBC: 6.8 10*3/uL (ref 4.0–10.3)
lymph#: 1.1 10*3/uL (ref 0.9–3.3)

## 2015-02-13 LAB — COMPREHENSIVE METABOLIC PANEL (CC13)
ALT: 15 U/L (ref 0–55)
ANION GAP: 12 meq/L — AB (ref 3–11)
AST: 18 U/L (ref 5–34)
Albumin: 4.1 g/dL (ref 3.5–5.0)
Alkaline Phosphatase: 86 U/L (ref 40–150)
BUN: 11.2 mg/dL (ref 7.0–26.0)
CALCIUM: 9.4 mg/dL (ref 8.4–10.4)
CHLORIDE: 105 meq/L (ref 98–109)
CO2: 26 meq/L (ref 22–29)
CREATININE: 1 mg/dL (ref 0.7–1.3)
EGFR: 78 mL/min/{1.73_m2} — ABNORMAL LOW (ref >90)
Glucose: 97 mg/dl (ref 70–140)
Potassium: 4.1 mEq/L (ref 3.5–5.1)
Sodium: 143 mEq/L (ref 136–145)
Total Bilirubin: 0.46 mg/dL (ref 0.20–1.20)
Total Protein: 7.2 g/dL (ref 6.4–8.3)

## 2015-02-13 MED ORDER — TRIAMCINOLONE ACETONIDE 0.025 % EX CREA: 1.0000 "application " | TOPICAL_CREAM | Freq: Three times a day (TID) | CUTANEOUS | Status: DC

## 2015-02-13 NOTE — Telephone Encounter (Signed)
Gave avs & calendar for April. °

## 2015-02-13 NOTE — Progress Notes (Signed)
Hematology and Oncology Follow Up Visit  [name] Johnston [id number] 11-09-1951 64 y.o. 02/13/2015 12:53 PM Default, Provider, MDNo ref. provider found   Principle Diagnosis: 64 year old gentleman with prostate cancer diagnosed in 2014. He presented with obstructive symptoms and found to have a Gleason score 4+4 = 8 prostate cancer with PSA of around 40.  Liver mass diagnosed in December 2015 with an elevated AFP of 9.3.  Prior Therapy: The patient was started on Firmagon on 06/27/2013. He subsequently received definitive radiation therapy for a planned dose of 75 gray completed in November 2014 under the care of Dr. Tammi Klippel. His PSA subsequently noted close to 0.33. Most recently his PSA was up to 11 on 10/29/2014 with a castrate levels of testosterone of 19. Staging workup did not reveal any bulky metastasis.   Current therapy: Casodex added in December 2015.  Interim History:  [name] presents today for a follow-up visit. Since his last visit, he continues on Casodex without any complications. He continues to have pruritus from a rash noted on his buttox and thigh area. These are chronic findings preceding the start of Casodex. Hydrocortisone cream did not help his symptoms. He reports dysuria and occasional hematuria. He is active and performs activities of daily living without any major changes. He does not report any headaches or blurry vision or syncope. He does not report any fevers, chills, sweats or weight loss. His appetite is reasonable without any reflux but does report occasional bloating. He does not report any frequency urgency but does report dysuria and occasional hematuria. He does not report any skeletal complaints of back pain shoulder pain or hip pain. He does not report any lymphadenopathy or petechiae. Rest of his review of systems unremarkable.   Medications: I have reviewed the patient's current medications.  Current Outpatient Prescriptions  Medication Sig Dispense Refill  .  acetaminophen (TYLENOL) 325 MG tablet Take 650 mg by mouth every 6 (six) hours as needed for pain.    Marland Kitchen albuterol (PROVENTIL HFA;VENTOLIN HFA) 108 (90 BASE) MCG/ACT inhaler Inhale 1-2 puffs into the lungs every 4 (four) hours as needed for wheezing.     . bicalutamide (CASODEX) 50 MG tablet Take 1 tablet (50 mg total) by mouth daily. 90 tablet 1  . HYDROcodone-acetaminophen (NORCO/VICODIN) 5-325 MG per tablet Take 1-2 tablets by mouth every 6 (six) hours as needed. 20 tablet 0  . hydrocortisone 1 % ointment Apply 1 application topically 2 (two) times daily. 30 g 0  . phenazopyridine (PYRIDIUM) 200 MG tablet Take 1 tablet (200 mg total) by mouth 3 (three) times daily as needed for pain (dysuria). 10 tablet 0  . polyethylene glycol (MIRALAX) packet Take 17 g by mouth daily. 14 each 3  . solifenacin (VESICARE) 10 MG tablet Take 1 tablet (10 mg total) by mouth daily. 30 tablet 6  . triamcinolone (KENALOG) 0.025 % cream Apply 1 application topically 3 (three) times daily. 30 g 0   No current facility-administered medications for this visit.     Allergies:  Allergies  Allergen Reactions  . Aspirin Hives    Past Medical History, Surgical history, Social history, and Family History were reviewed and updated.    Physical Exam: Blood pressure 128/79, pulse 65, temperature 98 F (36.7 C), temperature source Oral, resp. rate 18, height 5\' 3"  (1.6 m), weight 143 lb 9.6 oz (65.137 kg). ECOG: 1 General appearance: alert and cooperative Head: Normocephalic, without obvious abnormality Neck: no adenopathy Lymph nodes: Cervical, supraclavicular, and axillary nodes normal. Heart:regular  rate and rhythm, S1, S2 normal, no murmur, click, rub or gallop Lung:chest clear, no wheezing, rales, normal symmetric air entry Abdomin: soft, non-tender, without masses or organomegaly EXT:no erythema, induration, or nodules Skin: Raised plaque lesions noted on his thigh and bottox.  Lab Results: Lab Results   Component Value Date   WBC 6.8 02/13/2015   HGB 11.8* 02/13/2015   HCT 36.5* 02/13/2015   MCV 80.1 02/13/2015   PLT 199 02/13/2015     Chemistry      Component Value Date/Time   NA 141 01/01/2015 1446   NA 143 01/29/2014 0708   K 3.9 01/01/2015 1446   K 3.6* 01/29/2014 0708   CL 101 01/29/2014 0708   CO2 28 01/01/2015 1446   CO2 26 01/02/2014 1724   BUN 12.9 01/01/2015 1446   BUN 18 01/29/2014 0708   CREATININE 1.0 01/01/2015 1446   CREATININE 1.00 01/29/2014 0708      Component Value Date/Time   CALCIUM 9.4 01/01/2015 1446   CALCIUM 9.0 01/02/2014 1724   ALKPHOS 105 01/01/2015 1446   ALKPHOS 72 08/06/2010 0540   AST 20 01/01/2015 1446   AST 17 08/06/2010 0540   ALT 21 01/01/2015 1446   ALT 15 08/06/2010 0540   BILITOT 0.38 01/01/2015 1446   BILITOT 0.8 08/06/2010 0540       Results for [name] Johnston, [name] Johnston (MRN [id number]) as of 02/13/2015 12:36  Ref. Range 04/17/2013 12:48 11/28/2014 13:44 11/28/2014 13:44 01/01/2015 14:46  PSA Latest Range: <=4.00 ng/mL 28.54 (H) 18.88 (H)  20.18 (H)      Impression and Plan:  64 year old gentleman with the following issues:  1. Prostate cancer diagnosed in June 2014. He presented with obstructive symptoms and found to have a Gleason score 4+4 = 8 prostate cancer with PSA of around 40. He was treated with androgen deprivation and radiation therapy with an excellent initial response and a PSA nadir of 0.33. Most recently his PSA was up to 19 in 11/2014.  Bone scan and CT scan obtained on 11/14/2014 showed no evidence of bulky disease. He is currently on Casodex and tolerating it well and his PSA is relatively stable between 18 and 20. No changes is needed at this time. I've also recommended continuing androgen deprivation concomitantly with Casodex.  2. Liver masses: Differential diagnosis including hepatocellular carcinoma versus metastatic prostate cancer. He does have an elevated alpha-fetoprotein of 9.3 which is suspicious for a  hepatocellular carcinoma. He has been referred to interventional radiology last visit but he has not made the appointment. We will refer him again at this time for an evaluation and possible biopsy.  3. Skin rash: Unclear etiology and I doubt related to Casodex. I gave him triamcinolone cream I also referred him to a primary care physician for management of his other symptoms.  4. Constipation: Regular prescription for MiraLAX to take daily for regular bowel movements.  5. Follow-up: Will be in 6 to 7 weeks to follow his progress.  [facility], MD 2/24/201612:53 PM

## 2015-02-14 LAB — PSA: PSA: 48.41 ng/mL — ABNORMAL HIGH (ref <=4.00)

## 2015-04-10 ENCOUNTER — Other Ambulatory Visit (HOSPITAL_BASED_OUTPATIENT_CLINIC_OR_DEPARTMENT_OTHER): Payer: PPO

## 2015-04-10 ENCOUNTER — Ambulatory Visit (HOSPITAL_BASED_OUTPATIENT_CLINIC_OR_DEPARTMENT_OTHER): Payer: PPO | Admitting: Oncology

## 2015-04-10 ENCOUNTER — Telehealth: Payer: Self-pay | Admitting: Oncology

## 2015-04-10 VITALS — BP 131/71 | HR 71 | Temp 98.1°F | Resp 18 | Ht 63.0 in | Wt 139.3 lb

## 2015-04-10 DIAGNOSIS — R21 Rash and other nonspecific skin eruption: Secondary | ICD-10-CM

## 2015-04-10 DIAGNOSIS — C61 Malignant neoplasm of prostate: Secondary | ICD-10-CM

## 2015-04-10 DIAGNOSIS — R160 Hepatomegaly, not elsewhere classified: Secondary | ICD-10-CM

## 2015-04-10 DIAGNOSIS — K5900 Constipation, unspecified: Secondary | ICD-10-CM

## 2015-04-10 DIAGNOSIS — Z7689 Persons encountering health services in other specified circumstances: Secondary | ICD-10-CM

## 2015-04-10 LAB — CBC WITH DIFFERENTIAL/PLATELET
BASO%: 0.5 % (ref 0.0–2.0)
Basophils Absolute: 0 10*3/uL (ref 0.0–0.1)
EOS ABS: 0.1 10*3/uL (ref 0.0–0.5)
EOS%: 2.3 % (ref 0.0–7.0)
HCT: 36 % — ABNORMAL LOW (ref 38.4–49.9)
HGB: 12.4 g/dL — ABNORMAL LOW (ref 13.0–17.1)
LYMPH%: 18.6 % (ref 14.0–49.0)
MCH: 27.5 pg (ref 27.2–33.4)
MCHC: 34.4 g/dL (ref 32.0–36.0)
MCV: 79.8 fL (ref 79.3–98.0)
MONO#: 0.3 10*3/uL (ref 0.1–0.9)
MONO%: 7.1 % (ref 0.0–14.0)
NEUT#: 3.1 10*3/uL (ref 1.5–6.5)
NEUT%: 71.5 % (ref 39.0–75.0)
PLATELETS: 201 10*3/uL (ref 140–400)
RBC: 4.51 10*6/uL (ref 4.20–5.82)
RDW: 12.7 % (ref 11.0–14.6)
WBC: 4.4 10*3/uL (ref 4.0–10.3)
lymph#: 0.8 10*3/uL — ABNORMAL LOW (ref 0.9–3.3)

## 2015-04-10 LAB — COMPREHENSIVE METABOLIC PANEL (CC13)
ALBUMIN: 4.3 g/dL (ref 3.5–5.0)
ALT: 16 U/L (ref 0–55)
AST: 18 U/L (ref 5–34)
Alkaline Phosphatase: 97 U/L (ref 40–150)
Anion Gap: 14 mEq/L — ABNORMAL HIGH (ref 3–11)
BUN: 11.1 mg/dL (ref 7.0–26.0)
CALCIUM: 9 mg/dL (ref 8.4–10.4)
CHLORIDE: 105 meq/L (ref 98–109)
CO2: 24 mEq/L (ref 22–29)
CREATININE: 0.9 mg/dL (ref 0.7–1.3)
EGFR: >90 mL/min/{1.73_m2} (ref >90)
Glucose: 120 mg/dl (ref 70–140)
POTASSIUM: 3.8 meq/L (ref 3.5–5.1)
Sodium: 143 mEq/L (ref 136–145)
Total Bilirubin: 0.42 mg/dL (ref 0.20–1.20)
Total Protein: 7.7 g/dL (ref 6.4–8.3)

## 2015-04-10 MED ORDER — POLYETHYLENE GLYCOL 3350 17 G PO PACK: 17.0000 g | PACK | Freq: Every day | ORAL | Status: DC

## 2015-04-10 NOTE — Progress Notes (Signed)
Hematology and Oncology Follow Up Visit  [name] Johnston [id number] 1951-11-30 64 y.o. 04/10/2015 3:32 PM Default, Provider, MDNo ref. provider found   Principle Diagnosis: 64 year old gentleman with prostate cancer diagnosed in 2014. He presented with obstructive symptoms and found to have a Gleason score 4+4 = 8 prostate cancer with PSA of around 40.  Liver mass diagnosed in December 2015 with an elevated AFP of 9.3.  Prior Therapy: The patient was started on Firmagon on 06/27/2013. He subsequently received definitive radiation therapy for a planned dose of 75 gray completed in November 2014 under the care of Dr. Tammi Klippel. His PSA subsequently noted close to 0.33. Most recently his PSA was up to 11 on 10/29/2014 with a castrate levels of testosterone of 19. Staging workup did not reveal any bulky metastasis.   Current therapy: Casodex added in December 2015.  Interim History:  [name] Johnston presents today for a follow-up visit. Since his last visit, he reports slight increase in his constipation and ran out of MiraLAX. He continues on Casodex without any complications. He continues to have pruritus from a rash noted on his buttox and thigh area which has improved. He does report dry skin that have been chronic in nature.    He does not report any headaches or blurry vision or syncope. He does not report any fevers, chills, sweats or weight loss. His appetite is reasonable without any reflux but does report occasional bloating. He does not report any frequency urgency but does report dysuria and occasional hematuria. He does not report any skeletal complaints of back pain shoulder pain or hip pain. He does not report any lymphadenopathy or petechiae. Rest of his review of systems unremarkable.   Medications: I have reviewed the patient's current medications.  Current Outpatient Prescriptions  Medication Sig Dispense Refill  . bicalutamide (CASODEX) 50 MG tablet Take 1 tablet (50 mg total) by mouth daily. 90  tablet 1  . HYDROcodone-acetaminophen (NORCO/VICODIN) 5-325 MG per tablet Take 1-2 tablets by mouth every 6 (six) hours as needed. 20 tablet 0  . polyethylene glycol (MIRALAX) packet Take 17 g by mouth daily. 28 each 3  . solifenacin (VESICARE) 10 MG tablet Take 1 tablet (10 mg total) by mouth daily. 30 tablet 6   No current facility-administered medications for this visit.     Allergies:  Allergies  Allergen Reactions  . Aspirin Hives    Past Medical History, Surgical history, Social history, and Family History were reviewed and updated.    Physical Exam: Blood pressure 131/71, pulse 71, temperature 98.1 F (36.7 C), temperature source Oral, resp. rate 18, height 5\' 3"  (1.6 m), weight 139 lb 4.8 oz (63.186 kg). ECOG: 1 General appearance: alert and cooperative Head: Normocephalic, without obvious abnormality Neck: no adenopathy Lymph nodes: Cervical, supraclavicular, and axillary nodes normal. Heart:regular rate and rhythm, S1, S2 normal, no murmur, click, rub or gallop Lung:chest clear, no wheezing, rales, normal symmetric air entry Abdomin: soft, non-tender, without masses or organomegaly EXT:no erythema, induration, or nodules Skin: dry skin noted without any erythema or induration. Appears to be faint.   Lab Results: Lab Results  Component Value Date   WBC 4.4 04/10/2015   HGB 12.4* 04/10/2015   HCT 36.0* 04/10/2015   MCV 79.8 04/10/2015   PLT 201 04/10/2015     Chemistry      Component Value Date/Time   NA 143 02/13/2015 1216   NA 143 01/29/2014 0708   K 4.1 02/13/2015 1216   K 3.6* 01/29/2014 0708  CL 101 01/29/2014 0708   CO2 26 02/13/2015 1216   CO2 26 01/02/2014 1724   BUN 11.2 02/13/2015 1216   BUN 18 01/29/2014 0708   CREATININE 1.0 02/13/2015 1216   CREATININE 1.00 01/29/2014 0708      Component Value Date/Time   CALCIUM 9.4 02/13/2015 1216   CALCIUM 9.0 01/02/2014 1724   ALKPHOS 86 02/13/2015 1216   ALKPHOS 72 08/06/2010 0540   AST 18  02/13/2015 1216   AST 17 08/06/2010 0540   ALT 15 02/13/2015 1216   ALT 15 08/06/2010 0540   BILITOT 0.46 02/13/2015 1216   BILITOT 0.8 08/06/2010 0540      Results for [name] Johnston, [name] Johnston (MRN [id number]) as of 04/10/2015 14:45  Ref. Range 01/01/2015 14:46 02/13/2015 12:16  PSA Latest Ref Range: <=4.00 ng/mL 20.18 (H) 48.41 (H)       Impression and Plan:  64 year old gentleman with the following issues:  1. Prostate cancer diagnosed in June 2014. He presented with obstructive symptoms and found to have a Gleason score 4+4 = 8 prostate cancer with PSA of around 40. He was treated with androgen deprivation and radiation therapy with an excellent initial response and a PSA nadir of 0.33. Most recently his PSA was up to 19 in 11/2014.  Bone scan and CT scan obtained on 11/14/2014 showed no evidence of bulky disease. He is currently on Casodex and tolerating it well and his PSA is slightly increasing as of late but he does not have any measurable disease. The plan is to continue to monitor him for the time being an attempt scar line hormonal manipulation if his PSA continues to rise.  2. Liver masses: Differential diagnosis including hepatocellular carcinoma versus metastatic prostate cancer. He does have an elevated alpha-fetoprotein of 9.3 which is suspicious for a hepatocellular carcinoma. He has been referred to interventional radiology for evaluation of liver directed therapy.  3. Skin rash: Unclear etiology and I doubt related to Casodex. I instructed him to continue to use moisturizing lotions for the time being.   4. Constipation: Regular prescription for MiraLAX to take daily for regular bowel movements. have refilled this medication for him.   5. Follow-up: Will be in 3 months.   Zola Button, MD 4/20/20163:32 PM

## 2015-04-10 NOTE — Telephone Encounter (Signed)
gave and printed appt sched and avs for pt for July...Marland Kitchenper Otila Kluver in IR....pt will be contacted to sched IR mgt and eval

## 2015-04-11 LAB — PSA: PSA: 120.1 ng/mL — ABNORMAL HIGH (ref <=4.00)

## 2015-04-15 ENCOUNTER — Encounter (HOSPITAL_COMMUNITY): Payer: Self-pay | Admitting: Emergency Medicine

## 2015-04-15 DIAGNOSIS — J45909 Unspecified asthma, uncomplicated: Secondary | ICD-10-CM | POA: Insufficient documentation

## 2015-04-15 DIAGNOSIS — Z8551 Personal history of malignant neoplasm of bladder: Secondary | ICD-10-CM | POA: Diagnosis not present

## 2015-04-15 DIAGNOSIS — Z79899 Other long term (current) drug therapy: Secondary | ICD-10-CM | POA: Diagnosis not present

## 2015-04-15 DIAGNOSIS — J984 Other disorders of lung: Secondary | ICD-10-CM | POA: Diagnosis not present

## 2015-04-15 DIAGNOSIS — K769 Liver disease, unspecified: Secondary | ICD-10-CM | POA: Insufficient documentation

## 2015-04-15 DIAGNOSIS — Z792 Long term (current) use of antibiotics: Secondary | ICD-10-CM | POA: Diagnosis not present

## 2015-04-15 DIAGNOSIS — Z8639 Personal history of other endocrine, nutritional and metabolic disease: Secondary | ICD-10-CM | POA: Insufficient documentation

## 2015-04-15 DIAGNOSIS — R109 Unspecified abdominal pain: Secondary | ICD-10-CM | POA: Diagnosis present

## 2015-04-15 DIAGNOSIS — Z8546 Personal history of malignant neoplasm of prostate: Secondary | ICD-10-CM | POA: Insufficient documentation

## 2015-04-15 DIAGNOSIS — N390 Urinary tract infection, site not specified: Secondary | ICD-10-CM | POA: Insufficient documentation

## 2015-04-15 LAB — COMPREHENSIVE METABOLIC PANEL
ALK PHOS: 92 U/L (ref 39–117)
ALT: 21 U/L (ref 0–53)
ANION GAP: 11 (ref 5–15)
AST: 24 U/L (ref 0–37)
Albumin: 4.4 g/dL (ref 3.5–5.2)
BUN: 13 mg/dL (ref 6–23)
CALCIUM: 9.5 mg/dL (ref 8.4–10.5)
CO2: 25 mmol/L (ref 19–32)
CREATININE: 0.95 mg/dL (ref 0.50–1.35)
Chloride: 102 mmol/L (ref 96–112)
GFR calc Af Amer: >90 mL/min (ref >90)
GFR calc non Af Amer: 85 mL/min — ABNORMAL LOW (ref >90)
Glucose, Bld: 107 mg/dL — ABNORMAL HIGH (ref 70–99)
POTASSIUM: 4.5 mmol/L (ref 3.5–5.1)
Sodium: 138 mmol/L (ref 135–145)
TOTAL PROTEIN: 7.7 g/dL (ref 6.0–8.3)
Total Bilirubin: 0.8 mg/dL (ref 0.3–1.2)

## 2015-04-15 LAB — URINALYSIS, ROUTINE W REFLEX MICROSCOPIC
Bilirubin Urine: NEGATIVE
GLUCOSE, UA: NEGATIVE mg/dL
Ketones, ur: NEGATIVE mg/dL
NITRITE: NEGATIVE
Protein, ur: NEGATIVE mg/dL
Specific Gravity, Urine: 1.017 (ref 1.005–1.030)
Urobilinogen, UA: 0.2 mg/dL (ref 0.0–1.0)
pH: 6.5 (ref 5.0–8.0)

## 2015-04-15 LAB — CBC WITH DIFFERENTIAL/PLATELET
Basophils Absolute: 0 10*3/uL (ref 0.0–0.1)
Basophils Relative: 1 % (ref 0–1)
EOS PCT: 4 % (ref 0–5)
Eosinophils Absolute: 0.2 10*3/uL (ref 0.0–0.7)
HEMATOCRIT: 37.7 % — AB (ref 39.0–52.0)
HEMOGLOBIN: 13 g/dL (ref 13.0–17.0)
Lymphocytes Relative: 17 % (ref 12–46)
Lymphs Abs: 0.8 10*3/uL (ref 0.7–4.0)
MCH: 27.1 pg (ref 26.0–34.0)
MCHC: 34.5 g/dL (ref 30.0–36.0)
MCV: 78.5 fL (ref 78.0–100.0)
MONO ABS: 0.5 10*3/uL (ref 0.1–1.0)
Monocytes Relative: 10 % (ref 3–12)
Neutro Abs: 3.2 10*3/uL (ref 1.7–7.7)
Neutrophils Relative %: 68 % (ref 43–77)
PLATELETS: 205 10*3/uL (ref 150–400)
RBC: 4.8 MIL/uL (ref 4.22–5.81)
RDW: 12.7 % (ref 11.5–15.5)
WBC: 4.6 10*3/uL (ref 4.0–10.5)

## 2015-04-15 LAB — URINE MICROSCOPIC-ADD ON

## 2015-04-15 NOTE — ED Notes (Signed)
Patient here with left flank pain. States x3 episodes this month without resolution. Explains that he has had little appetite, and infrequent bowel movements. Denies dysuria or vomiting/nausea.

## 2015-04-16 ENCOUNTER — Emergency Department (HOSPITAL_COMMUNITY)
Admission: EM | Admit: 2015-04-16 | Discharge: 2015-04-16 | Disposition: A | Discharge status: Home / Self Care | Payer: PPO | Attending: Emergency Medicine | Admitting: Emergency Medicine

## 2015-04-16 ENCOUNTER — Emergency Department (HOSPITAL_COMMUNITY): Payer: PPO

## 2015-04-16 DIAGNOSIS — J984 Other disorders of lung: Secondary | ICD-10-CM

## 2015-04-16 DIAGNOSIS — R109 Unspecified abdominal pain: Secondary | ICD-10-CM

## 2015-04-16 DIAGNOSIS — N390 Urinary tract infection, site not specified: Secondary | ICD-10-CM

## 2015-04-16 DIAGNOSIS — K769 Liver disease, unspecified: Secondary | ICD-10-CM

## 2015-04-16 MED ORDER — HYDROMORPHONE HCL 1 MG/ML IJ SOLN
0.5000 mg | Freq: Once | INTRAMUSCULAR | Status: DC
  Filled 2015-04-16: qty 1

## 2015-04-16 MED ORDER — HYDROCODONE-ACETAMINOPHEN 5-325 MG PO TABS
1.0000 | ORAL_TABLET | Freq: Four times a day (QID) | ORAL | Status: DC | PRN
Start: 2015-04-16 — End: 2015-11-21

## 2015-04-16 MED ORDER — ONDANSETRON HCL 4 MG/2ML IJ SOLN
4.0000 mg | Freq: Once | INTRAMUSCULAR | Status: DC
  Filled 2015-04-16: qty 2

## 2015-04-16 MED ORDER — CEPHALEXIN 500 MG PO CAPS: 500.0000 mg | ORAL_CAPSULE | Freq: Four times a day (QID) | ORAL | Status: DC

## 2015-04-16 MED ORDER — DEXTROSE 5 % IV SOLN
1.0000 g | Freq: Once | INTRAVENOUS | Status: AC
  Administered 2015-04-16: 1 g via INTRAVENOUS
  Filled 2015-04-16: qty 10

## 2015-04-16 NOTE — Discharge Instructions (Signed)
It was our pleasure to provide your ER care today - we hope that you feel better.  Rest. Drink plenty of fluids.  Your urine tests show a probable urine infection - take antibiotic (keflex) as prescribed.  A urine culture was sent the results of which will be back in 2-3 days time - have your doctor follow up on that result then.  You may take hydrocodone as need for pain. No driving when taking hydrocodone. Also, do not take tylenol or acetaminophen containing medication when taking hydrocodone.    Your ct scan was read as follows: IMPRESSION: 1. Large masses within the liver have increased in size from prior studies, concerning for worsening metastatic disease. The largest masses measure 7.4 cm and 4.9 cm in size. Would correlate as to whether the source of metastasis has been previously diagnosed. 2. 1.0 cm nodule at the right middle lung lobe has increased from 7 mm on the prior CT, concerning for metastatic disease. Suggestion of smaller nodule just above the right hemidiaphragm, not well Characterized.  Follow up with your hematology/oncology doctor in the next 1-2 weeks as planned - discuss above ct findings with them.  Return to ER if worse, new symptoms, severe pain, persistent vomiting, weak/faint, other concern.   You were given pain medication in the ER - no driving for the next 4 hours.      Urinary Tract Infection Urinary tract infections (UTIs) can develop anywhere along your urinary tract. Your urinary tract is your body's drainage system for removing wastes and extra water. Your urinary tract includes two kidneys, two ureters, a bladder, and a urethra. Your kidneys are a pair of bean-shaped organs. Each kidney is about the size of your fist. They are located below your ribs, one on each side of your spine. CAUSES Infections are caused by microbes, which are microscopic organisms, including fungi, viruses, and bacteria. These organisms are so small that they can only  be seen through a microscope. Bacteria are the microbes that most commonly cause UTIs. SYMPTOMS  Symptoms of UTIs may vary by age and gender of the patient and by the location of the infection. Symptoms in young women typically include a frequent and intense urge to urinate and a painful, burning feeling in the bladder or urethra during urination. Older women and men are more likely to be tired, shaky, and weak and have muscle aches and abdominal pain. A fever may mean the infection is in your kidneys. Other symptoms of a kidney infection include pain in your back or sides below the ribs, nausea, and vomiting. DIAGNOSIS To diagnose a UTI, your caregiver will ask you about your symptoms. Your caregiver also will ask to provide a urine sample. The urine sample will be tested for bacteria and white blood cells. White blood cells are made by your body to help fight infection. TREATMENT  Typically, UTIs can be treated with medication. Because most UTIs are caused by a bacterial infection, they usually can be treated with the use of antibiotics. The choice of antibiotic and length of treatment depend on your symptoms and the type of bacteria causing your infection. HOME CARE INSTRUCTIONS  If you were prescribed antibiotics, take them exactly as your caregiver instructs you. Finish the medication even if you feel better after you have only taken some of the medication.  Drink enough water and fluids to keep your urine clear or pale yellow.  Avoid caffeine, tea, and carbonated beverages. They tend to irritate your bladder.  Empty your bladder often. Avoid holding urine for long periods of time.  Empty your bladder before and after sexual intercourse.  After a bowel movement, women should cleanse from front to back. Use each tissue only once. SEEK MEDICAL CARE IF:   You have back pain.  You develop a fever.  Your symptoms do not begin to resolve within 3 days. SEEK IMMEDIATE MEDICAL CARE IF:    You have severe back pain or lower abdominal pain.  You develop chills.  You have nausea or vomiting.  You have continued burning or discomfort with urination. MAKE SURE YOU:   Understand these instructions.  Will watch your condition.  Will get help right away if you are not doing well or get worse. Document Released: 09/16/2005 Document Revised: 06/07/2012 Document Reviewed: 01/15/2012 [facility] Patient Information 2015 [employer], [place]. This information is not intended to replace advice given to you by your health care provider. Make sure you discuss any questions you have with your health care provider.   Abdominal Pain Many things can cause abdominal pain. Usually, abdominal pain is not caused by a disease and will improve without treatment. It can often be observed and treated at home. Your health care provider will do a physical exam and possibly order blood tests and X-rays to help determine the seriousness of your pain. However, in many cases, more time must pass before a clear cause of the pain can be found. Before that point, your health care provider may not know if you need more testing or further treatment. HOME CARE INSTRUCTIONS  Monitor your abdominal pain for any changes. The following actions may help to alleviate any discomfort you are experiencing:  Only take over-the-counter or prescription medicines as directed by your health care provider.  Do not take laxatives unless directed to do so by your health care provider.  Try a clear liquid diet (broth, tea, or water) as directed by your health care provider. Slowly move to a bland diet as tolerated. SEEK MEDICAL CARE IF:  You have unexplained abdominal pain.  You have abdominal pain associated with nausea or diarrhea.  You have pain when you urinate or have a bowel movement.  You experience abdominal pain that wakes you in the night.  You have abdominal pain that is worsened or improved by eating  food.  You have abdominal pain that is worsened with eating fatty foods.  You have a fever. SEEK IMMEDIATE MEDICAL CARE IF:   Your pain does not go away within 2 hours.  You keep throwing up (vomiting).  Your pain is felt only in portions of the abdomen, such as the right side or the left lower portion of the abdomen.  You pass bloody or black tarry stools. MAKE SURE YOU:  Understand these instructions.   Will watch your condition.   Will get help right away if you are not doing well or get worse.  Document Released: 09/16/2005 Document Revised: 12/12/2013 Document Reviewed: 08/16/2013 [facility] Patient Information 2015 [place], [place]. This information is not intended to replace advice given to you by your health care provider. Make sure you discuss any questions you have with your health care provider.     ?au b?ng (Abdominal Pain) C nhi?u nguyn nhn d?n ??n ?au b?ng. Thng th??ng ?au b?ng l do m?t b?nh gy ra v s? khng ?? n?u khng ?i?u tr?Marland Kitchen B?nh ny c th? ???c theo di v ?i?u tr? t?i nh. Chuyn gia ch?m Chauncey s?c kh?e s? ti?n hnh khm  th?c th? v c th? yu c?u lm xt nghi?m mu v ch?p X quang ?? xc ??nh m?c ?? nghim tr?ng c?a c?n ?au b?ng. Tuy nhin, trong nhi?u tr??ng h?p, ph?i m?t nhi?u th?i gian h?n ?? xc ??nh r nguyn nhn gy ?au b?ng. Tr??c khi tm ra nguyn nhn, chuyn gia ch?m [place] s?c kh?e c th? khng bi?t li?u qu v? c c?n lm thm xt nghi?m ho?c ti?p t?c ?i?u tr? hay khng. H??NG D?N CH?M [place] T?I [place]  Theo di c?n ?au b?ng xem c b?t k? thay ??i no khng. Nh?ng hnh ??ng sau c th? gip lo?i b? b?t c? c?m gic kh ch?u no qu v? ?ang b?.  Ch? s? d?ng thu?c khng c?n k ??n ho?c thu?c c?n k ??n theo ch? d?n c?a chuyn gia ch?m [place] s?c kh?e.  Khng dng thu?c nhu?n trng tr? khi ???c chuyn gia ch?m [place] s?c kh?e ch? ??nh.  Th? dng m?t ch? ?? ?n l?ng (n??c lu?c th?t, tr, ho?c n??c) theo ch? d?n c?a chuyn gia ch?m [place] s?c kh?e. Chuy?n d?n sang  m?t ch? ?? ?n nh? n?u ch?u ???c. ?I K[facility] N?U:  Qu v? b? ?au b?ng khng r nguyn nhn.  Qu v? b? ?au b?ng km theo bu?n nn ho?c tiu ch?y.  Qu v? b? ?au khi ?i ti?u ho?c ?i ngoi.  Qu v? b? c?n ?au b?ng lm th?c gi?c vo ban ?m.  Qu v? b? ?au b?ng n?ng thm ho?c ?? h?n khi ?n.  Qu v? b? ?au b?ng n?ng thm khi ?n ?? ?n nhi?u ch?t bo.  Qu v? b? s?t. NGAY L?P T?C ?I K[facility] N?U:   C?n ?au khng kh?i trong vng 2 gi?Sander Nephew v? v?n ti?p t?c b? i (nn m?a).  Ch? c th? c?m th?y ?au ? m?t s? ph?n b?ng, ch?ng h?n nh? ? ph?n b?ng bn ph?i ho?c bn d??i tri.  Phn c?a qu v? c mu ho?c c mu ?en nh? h?c n. ??M B?O QU V?:  Hi?u r cc h??ng d?n ny.  S? theo di tnh tr?ng c?a mnh.  S? yu c?u tr? gip ngay l?p t?c n?u qu v? c?m th?y khng kh?e ho?c th?y tr?m tr?ng h?n. Document Released: 12/07/2005 Document Revised: 12/12/2013 [facility] Patient Information 2015 [place]. This information is not intended to replace advice given to you by your health care provider. Make sure you discuss any questions you have with your health care provider.
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
## 2015-04-17 ENCOUNTER — Ambulatory Visit
Admission: RE | Admit: 2015-04-17 | Discharge: 2015-04-17 | Disposition: A | Discharge status: Home / Self Care | Payer: PPO | Source: Ambulatory Visit | Attending: Oncology | Admitting: Oncology

## 2015-04-17 ENCOUNTER — Other Ambulatory Visit: Payer: Self-pay | Admitting: Oncology

## 2015-04-17 DIAGNOSIS — R160 Hepatomegaly, not elsewhere classified: Secondary | ICD-10-CM | POA: Insufficient documentation

## 2015-04-17 DIAGNOSIS — C61 Malignant neoplasm of prostate: Secondary | ICD-10-CM

## 2015-04-17 LAB — URINE CULTURE: Colony Count: 4000
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
## 2015-04-19 ENCOUNTER — Other Ambulatory Visit (HOSPITAL_COMMUNITY): Payer: Self-pay | Admitting: Diagnostic Radiology

## 2015-04-19 DIAGNOSIS — K769 Liver disease, unspecified: Secondary | ICD-10-CM

## 2015-04-24 ENCOUNTER — Other Ambulatory Visit (HOSPITAL_COMMUNITY): Payer: PPO

## 2015-04-24 ENCOUNTER — Other Ambulatory Visit: Payer: Self-pay | Admitting: Radiology

## 2015-04-25 ENCOUNTER — Ambulatory Visit (HOSPITAL_COMMUNITY)
Admission: RE | Admit: 2015-04-25 | Discharge: 2015-04-25 | Disposition: A | Discharge status: Home / Self Care | Payer: PPO | Source: Ambulatory Visit | Attending: Diagnostic Radiology | Admitting: Diagnostic Radiology

## 2015-04-25 ENCOUNTER — Other Ambulatory Visit (HOSPITAL_COMMUNITY): Payer: Self-pay | Admitting: Diagnostic Radiology

## 2015-04-25 DIAGNOSIS — K769 Liver disease, unspecified: Secondary | ICD-10-CM

## 2015-04-25 LAB — CBC
HCT: 38 % — ABNORMAL LOW (ref 39.0–52.0)
HEMOGLOBIN: 12.7 g/dL — AB (ref 13.0–17.0)
MCH: 27.1 pg (ref 26.0–34.0)
MCHC: 33.4 g/dL (ref 30.0–36.0)
MCV: 81.2 fL (ref 78.0–100.0)
Platelets: 239 10*3/uL (ref 150–400)
RBC: 4.68 MIL/uL (ref 4.22–5.81)
RDW: 12.8 % (ref 11.5–15.5)
WBC: 5.9 10*3/uL (ref 4.0–10.5)

## 2015-04-25 LAB — PROTIME-INR
INR: 0.96 (ref 0.00–1.49)
Prothrombin Time: 12.9 seconds (ref 11.6–15.2)

## 2015-04-25 LAB — APTT: APTT: 29 s (ref 24–37)

## 2015-04-25 MED ORDER — MIDAZOLAM HCL 2 MG/2ML IJ SOLN
INTRAMUSCULAR | Status: AC | PRN
  Administered 2015-04-25 (×2): 1 mg via INTRAVENOUS

## 2015-04-25 MED ORDER — GELATIN ABSORBABLE 12-7 MM EX MISC
CUTANEOUS | Status: AC
  Filled 2015-04-25: qty 1

## 2015-04-25 MED ORDER — FENTANYL CITRATE (PF) 100 MCG/2ML IJ SOLN
INTRAMUSCULAR | Status: AC | PRN
  Administered 2015-04-25: 50 ug via INTRAVENOUS

## 2015-04-25 MED ORDER — LIDOCAINE HCL 1 % IJ SOLN
INTRAMUSCULAR | Status: AC
  Filled 2015-04-25: qty 20

## 2015-04-25 MED ORDER — SODIUM CHLORIDE 0.9 % IV SOLN
INTRAVENOUS | Status: AC | PRN
  Administered 2015-04-25: 10 mL/h via INTRAVENOUS

## 2015-04-25 MED ORDER — FENTANYL CITRATE (PF) 100 MCG/2ML IJ SOLN
INTRAMUSCULAR | Status: AC
  Filled 2015-04-25: qty 2

## 2015-04-25 MED ORDER — MIDAZOLAM HCL 2 MG/2ML IJ SOLN
INTRAMUSCULAR | Status: AC
  Filled 2015-04-25: qty 2

## 2015-04-25 MED ORDER — OXYCODONE HCL 5 MG PO TABS: 5.0000 mg | ORAL_TABLET | ORAL | Status: DC | PRN

## 2015-04-25 MED ORDER — SODIUM CHLORIDE 0.9 % IV SOLN
INTRAVENOUS | Status: DC
  Administered 2015-04-25: 12:00:00 via INTRAVENOUS

## 2015-04-25 MED ORDER — LIDOCAINE HCL (PF) 1 % IJ SOLN
INTRAMUSCULAR | Status: AC
  Filled 2015-04-25: qty 10

## 2015-04-25 NOTE — Progress Notes (Signed)
Patient ID: [name] Johnston, male   DOB: 14-Sep-1951, 64 y.o.   MRN: [id number]    Referring Physician(s): Dahlia Byes  Subjective: Pt recently seen in consultation by Dr. Anselm Pancoast for further evaluation/treatment of liver lesions noted on recent imaging. See full consult note done 04/14/15 for additional details. Pt presents today for US guided liver lesion biopsy to confirm diagnosis. He has hx of prostate and bladder cancers as well as right lung nodule. Only current c/o is abd bloating.   Allergies: Aspirin  Medications: Prior to Admission medications   Medication Sig Start Date End Date Taking? Authorizing Provider  bicalutamide (CASODEX) 50 MG tablet Take 1 tablet (50 mg total) by mouth daily. 01/01/15  Yes Wyatt Portela, MD  cephALEXin (KEFLEX) 500 MG capsule Take 1 capsule (500 mg total) by mouth 4 (four) times daily. Patient taking differently: Take 500 mg by mouth 4 (four) times daily. 7 day course started 04/16/15 04/16/15  Yes Lajean Saver, MD  HYDROcodone-acetaminophen (NORCO/VICODIN) 5-325 MG per tablet Take 1-2 tablets by mouth every 6 (six) hours as needed for moderate pain. 04/16/15  Yes Lajean Saver, MD  polyethylene glycol [facility]) packet Take 17 g by mouth daily. 04/10/15  Yes Wyatt Portela, MD  solifenacin (VESICARE) 10 MG tablet Take 1 tablet (10 mg total) by mouth daily. 01/29/14  Yes Rana Snare, MD  HYDROcodone-acetaminophen (NORCO/VICODIN) 5-325 MG per tablet Take 1-2 tablets by mouth every 6 (six) hours as needed. Patient not taking: Reported on 04/19/2015 01/29/14   Rana Snare, MD     Vital Signs:  BP 145/77  HR 63  R 20  TEMP 97.7  O2 SATS 100% RA  Physical Exam pt awake /alert; chest- CTA bilat; heart- RRR; abd- soft,+BS, NT, mildly distended; ext- FROM , no edema  Imaging: No results found.  Labs:  CBC:  Recent Labs  01/01/15 1446 02/13/15 1215 04/10/15 1456 04/15/15 2219  WBC 4.2 6.8 4.4 4.6  HGB 12.9* 11.8* 12.4* 13.0  HCT 39.5 36.5* 36.0* 37.7*  PLT 258  199 201 205    COAGS: No results for input(s): INR, APTT in the last 8760 hours.  BMP:  Recent Labs  01/01/15 1446 02/13/15 1216 04/10/15 1456 04/15/15 2219  NA 141 143 143 138  K 3.9 4.1 3.8 4.5  CL  --   --   --  102  CO2 28 26 24 25   GLUCOSE 109 97 120 107*  BUN 12.9 11.2 11.1 13  CALCIUM 9.4 9.4 9.0 9.5  CREATININE 1.0 1.0 0.9 0.95  GFRNONAA  --   --   --  85*  GFRAA  --   --   --  >90    LIVER FUNCTION TESTS:  Recent Labs  01/01/15 1446 02/13/15 1216 04/10/15 1456 04/15/15 2219  BILITOT 0.38 0.46 0.42 0.8  AST 20 18 18 24   ALT 21 15 16 21   ALKPHOS 105 86 97 92  PROT 8.2 7.2 7.7 7.7  ALBUMIN 4.4 4.1 4.3 4.4    Assessment and Plan: Pt with hx prostate/bladder cancers, recent imaging revealing enlarging liver lesions, right lung nodule. Request now received for US guided liver lesion biopsy. Risks and benefits discussed with the patient/daughter via interpreter including, but not limited to bleeding, infection, damage to adjacent structures or low yield requiring additional tests. All of the patient's questions were answered, patient is agreeable to proceed. Consent signed and in chart.     Signed: Autumn Messing 04/25/2015, 1:01 PM   I spent  a total of 15 minutes  in face to face in clinical consultation/evaluation, greater than 50% of which was counseling/coordinating care for US guided liver lesion biopsy

## 2015-04-25 NOTE — Procedures (Signed)
US guided liver lesion biopsy.  3 cores obtained.  No immediate complication.

## 2015-04-25 NOTE — Discharge Instructions (Signed)
Liver Biopsy, Care After °These instructions give you information on caring for yourself after your procedure. Your doctor may also give you more specific instructions. Call your doctor if you have any problems or questions after your procedure. °HOME CARE °· Rest at home for 1-2 days or as told by your doctor. °· Have someone stay with you for at least 24 hours. °· Do not do these things in the first 24 hours: °¨ Drive. °¨ Use machinery. °¨ Take care of other people. °¨ Sign legal documents. °¨ Take a bath or shower. °· There are many different ways to close and cover a cut (incision). For example, a cut can be closed with stitches, skin glue, or adhesive strips. Follow your doctor's instructions on: °¨ Taking care of your cut. °¨ Changing and removing your bandage (dressing). °¨ Removing whatever was used to close your cut. °· Do not drink alcohol in the first week. °· Do not lift more than 5 pounds or play contact sports for the first 2 weeks. °· Take medicines only as told by your doctor. For 1 week, do not take medicine that has aspirin in it or medicines like ibuprofen. °· Get your test results. °GET HELP IF: °· A cut bleeds and leaves more than just a small spot of blood. °· A cut is red, puffs up (swells), or hurts more than before. °· Fluid or something else comes from a cut. °· A cut smells bad. °· You have a fever or chills. °GET HELP RIGHT AWAY IF: °· You have swelling, bloating, or pain in your belly (abdomen). °· You get dizzy or faint. °· You have a rash. °· You feel sick to your stomach (nauseous) or throw up (vomit). °· You have trouble breathing, feel short of breath, or feel faint. °· Your chest hurts. °· You have problems talking or seeing. °· You have trouble balancing or moving your arms or legs. °Document Released: 09/15/2008 Document Revised: 04/23/2014 Document Reviewed: 02/02/2014 °ExitCare® Patient Information ©2015 [employer]. This information is not intended to replace advice given to  you by your health care provider. Make sure you discuss any questions you have with your health care provider. ° °

## 2015-04-30 ENCOUNTER — Telehealth: Payer: Self-pay | Admitting: Oncology

## 2015-04-30 NOTE — Telephone Encounter (Signed)
Faxed pt medical records to Dr. Ernie Hew [phone]

## 2015-05-01 ENCOUNTER — Telehealth: Payer: Self-pay | Admitting: Oncology

## 2015-05-01 ENCOUNTER — Other Ambulatory Visit: Payer: Self-pay | Admitting: Oncology

## 2015-05-01 NOTE — Telephone Encounter (Signed)
Spoke with daughter and confirmed appointment for 05/12.

## 2015-05-02 ENCOUNTER — Telehealth: Payer: Self-pay | Admitting: *Deleted

## 2015-05-02 ENCOUNTER — Ambulatory Visit (HOSPITAL_BASED_OUTPATIENT_CLINIC_OR_DEPARTMENT_OTHER): Payer: PPO | Admitting: Oncology

## 2015-05-02 VITALS — BP 140/71 | HR 71 | Temp 98.0°F | Resp 17 | Ht 61.0 in | Wt 138.5 lb

## 2015-05-02 DIAGNOSIS — K5900 Constipation, unspecified: Secondary | ICD-10-CM

## 2015-05-02 DIAGNOSIS — C787 Secondary malignant neoplasm of liver and intrahepatic bile duct: Secondary | ICD-10-CM | POA: Diagnosis not present

## 2015-05-02 DIAGNOSIS — C7989 Secondary malignant neoplasm of other specified sites: Secondary | ICD-10-CM

## 2015-05-02 DIAGNOSIS — C61 Malignant neoplasm of prostate: Secondary | ICD-10-CM | POA: Diagnosis not present

## 2015-05-02 MED ORDER — LIDOCAINE-PRILOCAINE 2.5-2.5 % EX CREA: 1.0000 "application " | TOPICAL_CREAM | CUTANEOUS | Status: DC | PRN

## 2015-05-02 MED ORDER — PROCHLORPERAZINE MALEATE 10 MG PO TABS: 10.0000 mg | ORAL_TABLET | Freq: Four times a day (QID) | ORAL | Status: DC | PRN

## 2015-05-02 NOTE — Progress Notes (Signed)
Hematology and Oncology Follow Up Visit  [name] Johnston [id number] [birth year] 64 y.o. 05/02/2015 1:52 PM [name] Johnston, MDDewey, [name], MD   Principle Diagnosis: 64 year old gentleman with prostate cancer diagnosed in 2014. He presented with obstructive symptoms and found to have a Gleason score 4+4 = 8 prostate cancer with PSA of around 40.  Liver mass diagnosed in December 2015 with an elevated AFP of 9.3.  Prior Therapy: The patient was started on Firmagon on 06/27/2013. He subsequently received definitive radiation therapy for a planned dose of 75 gray completed in November 2014 under the care of Dr. Tammi Klippel. His PSA subsequently noted close to 0.33. Most recently his PSA was up to 11 on 10/29/2014 with a castrate levels of testosterone of 19. Staging workup did not reveal any bulky metastasis.   Current therapy: Casodex added in December 2015.  Interim History:  [name] Johnston presents today for a follow-up visit. Since his last visit, he was seen in the emergency department on 04/16/2015 for increased abdominal pain and flank pain. CT scan obtained at that time showed a number of hepatic masses. There is too large masses within the liver measuring 7.4 cm at 4.9 cm. There appeared to have increased in size from prior studies. There is a 1.0 cm nodule in the right middle lobe increased from previous. He underwent a biopsy on  04/25/2015  which showed metastatic prostate cancer. Since the biopsy, he is feeling relatively fair with abdominal distention and early satiety. He is able to eat and keep food down. He has not had any weight loss or major changes in his performance status.    He does not report any headaches or blurry vision or syncope. He does not report any fevers, chills, sweats or weight loss. His appetite is reasonable without any reflux but does report occasional bloating. He does not report any frequency urgency but does report dysuria and occasional hematuria. He does not report any  skeletal complaints of back pain shoulder pain or hip pain. He does not report any lymphadenopathy or petechiae. Rest of his review of systems unremarkable.   Medications: I have reviewed the patient's current medications.  Current Outpatient Prescriptions  Medication Sig Dispense Refill  . bicalutamide (CASODEX) 50 MG tablet Take 1 tablet (50 mg total) by mouth daily. 90 tablet 1  . cephALEXin (KEFLEX) 500 MG capsule Take 1 capsule (500 mg total) by mouth 4 (four) times daily. (Patient taking differently: Take 500 mg by mouth 4 (four) times daily. 7 day course started 04/16/15) 28 capsule 0  . HYDROcodone-acetaminophen (NORCO/VICODIN) 5-325 MG per tablet Take 1-2 tablets by mouth every 6 (six) hours as needed for moderate pain. 20 tablet 0  . polyethylene glycol (MIRALAX) packet Take 17 g by mouth daily. 28 each 3  . solifenacin (VESICARE) 10 MG tablet Take 1 tablet (10 mg total) by mouth daily. 30 tablet 6  . lidocaine-prilocaine (EMLA) cream Apply 1 application topically as needed. 30 g 0  . prochlorperazine (COMPAZINE) 10 MG tablet Take 1 tablet (10 mg total) by mouth every 6 (six) hours as needed for nausea or vomiting. 30 tablet 0   No current facility-administered medications for this visit.     Allergies:  Allergies  Allergen Reactions  . Aspirin Hives    Past Medical History, Surgical history, Social history, and Family History were reviewed and updated.    Physical Exam: Blood pressure 140/71, pulse 71, temperature 98 F (36.7 C), temperature source Oral, resp. rate 17, height 5\' 1"  (  1.549 m), weight 138 lb 8 oz (62.823 kg), SpO2 100 %. ECOG: 1 General appearance: alert and cooperative Head: Normocephalic, without obvious abnormality Neck: no adenopathy Lymph nodes: Cervical, supraclavicular, and axillary nodes normal. Heart:regular rate and rhythm, S1, S2 normal, no murmur, click, rub or gallop Lung:chest clear, no wheezing, rales, normal symmetric air entry Abdomin:  soft, non-tender, without masses or organomegaly. Slightly tender. Good bowel sounds no shifting dullness. EXT:no erythema, induration, or nodules Skin: dry skin noted without any erythema or induration.   Lab Results: Lab Results  Component Value Date   WBC 5.9 04/25/2015   HGB 12.7* 04/25/2015   HCT 38.0* 04/25/2015   MCV 81.2 04/25/2015   PLT 239 04/25/2015     Chemistry      Component Value Date/Time   NA 138 04/15/2015 2219   NA 143 04/10/2015 1456   K 4.5 04/15/2015 2219   K 3.8 04/10/2015 1456   CL 102 04/15/2015 2219   CO2 25 04/15/2015 2219   CO2 24 04/10/2015 1456   BUN 13 04/15/2015 2219   BUN 11.1 04/10/2015 1456   CREATININE 0.95 04/15/2015 2219   CREATININE 0.9 04/10/2015 1456      Component Value Date/Time   CALCIUM 9.5 04/15/2015 2219   CALCIUM 9.0 04/10/2015 1456   ALKPHOS 92 04/15/2015 2219   ALKPHOS 97 04/10/2015 1456   AST 24 04/15/2015 2219   AST 18 04/10/2015 1456   ALT 21 04/15/2015 2219   ALT 16 04/10/2015 1456   BILITOT 0.8 04/15/2015 2219   BILITOT 0.42 04/10/2015 1456            Impression and Plan:  64 year old gentleman with the following issues:  1. Prostate cancer diagnosed in June 2014. He presented with obstructive symptoms and found to have a Gleason score 4+4 = 8 prostate cancer with PSA of around 40. He was treated with androgen deprivation and radiation therapy with an excellent initial response and a PSA nadir of 0.33. PSA was up to 19 and Casodex was added.  Most recently his PSA was up to 120 and rapidly progressing at this time. He has documented hepatic metastasis at this time. I have instructed him to stop Casodex for the time being given his disease progression.  Treatment options were discussed today and I feel his best choice would be systemic chemotherapy. Given his visceral metastasis and symptomatology, I feel the better option would be aggressive chemotherapy. The risks and benefits of Taxotere chemotherapy  were discussed today extensively. Complications include nausea, vomiting, myelosuppression, neutropenia, neutropenic sepsis, peripheral neuropathy, lower extremity edema as well as infusion related toxicities were discussed. The benefit would be palliation of symptoms and possibly extending his overall survival.  Oral agents such as Zytiga or xtandi would be less effective in the setting of visceral metastasis. After discussing the risks and benefits she is agreeable to proceed after chemotherapy education class.   2. Liver masses: biopsy-proven to be prostate cancer and certainly carries a poor prognosis but he would be a reasonable candidate for therapy.   3.  IV access: Risks and benefits of Port-A-Cath insertion was discussed. Comp N include bleeding, thrombosis and infection were reviewed. He is agreeable to proceed.   4. Constipation: Regular prescription for MiraLAX to take daily for regular bowel movements.  5. Antiemetics: Prescription for Compazine was given to the patient today.  6. Neutropenia prophylaxis: Neulasta will be added to his chemotherapy regimen.   7. Follow-up: Will be in 2 weeks to start  systemic chemotherapy once Port-A-Cath has been inserted.    All his questions were answered today to his satisfaction. He is accompanied today by rest of his family and interpreter.   Zola Button, MD 5/12/20161:52 PM

## 2015-05-02 NOTE — Telephone Encounter (Signed)
Per staff message and POF I have scheduled appts. Advised scheduler of appts. JMW  

## 2015-05-03 ENCOUNTER — Encounter: Payer: Self-pay | Admitting: Oncology

## 2015-05-03 ENCOUNTER — Encounter: Payer: Self-pay | Admitting: *Deleted

## 2015-05-03 NOTE — Progress Notes (Signed)
Spoke with daughter, answered her questions re: chemotherapy and having port placement after chemotherapy education class. She was concerned about his quality of life. Assured her we would support any decision that the patient makes.

## 2015-05-06 ENCOUNTER — Other Ambulatory Visit: Payer: Self-pay | Admitting: Physician Assistant

## 2015-05-06 ENCOUNTER — Encounter: Payer: Self-pay | Admitting: Oncology

## 2015-05-06 ENCOUNTER — Other Ambulatory Visit: Payer: Self-pay | Admitting: Radiology

## 2015-05-07 ENCOUNTER — Ambulatory Visit (HOSPITAL_COMMUNITY)
Admission: RE | Admit: 2015-05-07 | Discharge: 2015-05-07 | Disposition: A | Discharge status: Home / Self Care | Payer: PPO | Source: Ambulatory Visit | Attending: Oncology | Admitting: Oncology

## 2015-05-07 ENCOUNTER — Encounter (HOSPITAL_COMMUNITY): Payer: Self-pay

## 2015-05-07 ENCOUNTER — Other Ambulatory Visit: Payer: Self-pay | Admitting: Oncology

## 2015-05-07 DIAGNOSIS — E785 Hyperlipidemia, unspecified: Secondary | ICD-10-CM | POA: Insufficient documentation

## 2015-05-07 DIAGNOSIS — C787 Secondary malignant neoplasm of liver and intrahepatic bile duct: Secondary | ICD-10-CM | POA: Insufficient documentation

## 2015-05-07 DIAGNOSIS — C61 Malignant neoplasm of prostate: Secondary | ICD-10-CM | POA: Diagnosis present

## 2015-05-07 DIAGNOSIS — Z8551 Personal history of malignant neoplasm of bladder: Secondary | ICD-10-CM | POA: Insufficient documentation

## 2015-05-07 DIAGNOSIS — Z95828 Presence of other vascular implants and grafts: Secondary | ICD-10-CM

## 2015-05-07 DIAGNOSIS — J45909 Unspecified asthma, uncomplicated: Secondary | ICD-10-CM | POA: Insufficient documentation

## 2015-05-07 DIAGNOSIS — R339 Retention of urine, unspecified: Secondary | ICD-10-CM | POA: Diagnosis not present

## 2015-05-07 DIAGNOSIS — Z923 Personal history of irradiation: Secondary | ICD-10-CM | POA: Insufficient documentation

## 2015-05-07 DIAGNOSIS — I6203 Nontraumatic chronic subdural hemorrhage: Secondary | ICD-10-CM | POA: Insufficient documentation

## 2015-05-07 HISTORY — DX: Presence of other vascular implants and grafts: Z95.828

## 2015-05-07 HISTORY — PX: IR IMAGING GUIDED PORT INSERTION: IMG5740

## 2015-05-07 LAB — CBC WITH DIFFERENTIAL/PLATELET
Basophils Absolute: 0 10*3/uL (ref 0.0–0.1)
Basophils Relative: 0 % (ref 0–1)
Eosinophils Absolute: 0.1 10*3/uL (ref 0.0–0.7)
Eosinophils Relative: 3 % (ref 0–5)
HCT: 37.1 % — ABNORMAL LOW (ref 39.0–52.0)
HEMOGLOBIN: 12.3 g/dL — AB (ref 13.0–17.0)
LYMPHS ABS: 0.9 10*3/uL (ref 0.7–4.0)
LYMPHS PCT: 21 % (ref 12–46)
MCH: 26.9 pg (ref 26.0–34.0)
MCHC: 33.2 g/dL (ref 30.0–36.0)
MCV: 81.2 fL (ref 78.0–100.0)
MONOS PCT: 9 % (ref 3–12)
Monocytes Absolute: 0.4 10*3/uL (ref 0.1–1.0)
NEUTROS ABS: 2.9 10*3/uL (ref 1.7–7.7)
NEUTROS PCT: 67 % (ref 43–77)
Platelets: 221 10*3/uL (ref 150–400)
RBC: 4.57 MIL/uL (ref 4.22–5.81)
RDW: 12.7 % (ref 11.5–15.5)
WBC: 4.3 10*3/uL (ref 4.0–10.5)

## 2015-05-07 MED ORDER — LIDOCAINE HCL 1 % IJ SOLN
INTRAMUSCULAR | Status: AC
  Filled 2015-05-07: qty 20

## 2015-05-07 MED ORDER — FENTANYL CITRATE (PF) 100 MCG/2ML IJ SOLN
INTRAMUSCULAR | Status: AC | PRN
  Administered 2015-05-07: 50 ug via INTRAVENOUS

## 2015-05-07 MED ORDER — LIDOCAINE-EPINEPHRINE 2 %-1:100000 IJ SOLN
INTRAMUSCULAR | Status: AC
  Filled 2015-05-07: qty 1

## 2015-05-07 MED ORDER — FENTANYL CITRATE (PF) 100 MCG/2ML IJ SOLN
INTRAMUSCULAR | Status: AC
  Filled 2015-05-07: qty 4

## 2015-05-07 MED ORDER — SODIUM CHLORIDE 0.9 % IV SOLN
INTRAVENOUS | Status: DC
  Administered 2015-05-07: 13:00:00 via INTRAVENOUS

## 2015-05-07 MED ORDER — HEPARIN SOD (PORK) LOCK FLUSH 100 UNIT/ML IV SOLN
INTRAVENOUS | Status: AC
  Filled 2015-05-07: qty 5

## 2015-05-07 MED ORDER — MIDAZOLAM HCL 2 MG/2ML IJ SOLN
INTRAMUSCULAR | Status: AC | PRN
  Administered 2015-05-07 (×2): 1 mg via INTRAVENOUS

## 2015-05-07 MED ORDER — CEFAZOLIN SODIUM-DEXTROSE 2-3 GM-% IV SOLR
INTRAVENOUS | Status: AC
Start: 2015-05-07 — End: 2015-05-07
  Filled 2015-05-07: qty 50

## 2015-05-07 MED ORDER — CEFAZOLIN SODIUM-DEXTROSE 2-3 GM-% IV SOLR
2.0000 g | INTRAVENOUS | Status: AC
  Administered 2015-05-07: 2 g via INTRAVENOUS

## 2015-05-07 MED ORDER — HEPARIN SOD (PORK) LOCK FLUSH 100 UNIT/ML IV SOLN
INTRAVENOUS | Status: AC | PRN
  Administered 2015-05-07: 500 [IU]

## 2015-05-07 MED ORDER — MIDAZOLAM HCL 2 MG/2ML IJ SOLN
INTRAMUSCULAR | Status: AC
  Filled 2015-05-07: qty 6
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
## 2015-05-07 NOTE — Procedures (Signed)
Successful RT IJ POWER PORT TIP SVC/RA NO COMP STABLE FULL REPORT IN PACS READY FOR USE

## 2015-05-07 NOTE — Discharge Instructions (Signed)

## 2015-05-13 ENCOUNTER — Other Ambulatory Visit: Payer: PPO

## 2015-05-16 ENCOUNTER — Ambulatory Visit (HOSPITAL_BASED_OUTPATIENT_CLINIC_OR_DEPARTMENT_OTHER): Payer: PPO

## 2015-05-16 ENCOUNTER — Ambulatory Visit: Payer: PPO

## 2015-05-16 ENCOUNTER — Other Ambulatory Visit (HOSPITAL_BASED_OUTPATIENT_CLINIC_OR_DEPARTMENT_OTHER): Payer: PPO

## 2015-05-16 VITALS — BP 130/80 | HR 66 | Temp 98.1°F | Resp 20

## 2015-05-16 DIAGNOSIS — Z5111 Encounter for antineoplastic chemotherapy: Secondary | ICD-10-CM

## 2015-05-16 DIAGNOSIS — C787 Secondary malignant neoplasm of liver and intrahepatic bile duct: Secondary | ICD-10-CM

## 2015-05-16 DIAGNOSIS — Z95828 Presence of other vascular implants and grafts: Secondary | ICD-10-CM

## 2015-05-16 DIAGNOSIS — C61 Malignant neoplasm of prostate: Secondary | ICD-10-CM

## 2015-05-16 LAB — CBC WITH DIFFERENTIAL/PLATELET
BASO%: 0.7 % (ref 0.0–2.0)
Basophils Absolute: 0 10*3/uL (ref 0.0–0.1)
EOS%: 4.4 % (ref 0.0–7.0)
Eosinophils Absolute: 0.2 10*3/uL (ref 0.0–0.5)
HCT: 35.6 % — ABNORMAL LOW (ref 38.4–49.9)
HGB: 12 g/dL — ABNORMAL LOW (ref 13.0–17.1)
LYMPH#: 1 10*3/uL (ref 0.9–3.3)
LYMPH%: 22.7 % (ref 14.0–49.0)
MCH: 27 pg — AB (ref 27.2–33.4)
MCHC: 33.7 g/dL (ref 32.0–36.0)
MCV: 80.2 fL (ref 79.3–98.0)
MONO#: 0.5 10*3/uL (ref 0.1–0.9)
MONO%: 10.4 % (ref 0.0–14.0)
NEUT#: 2.7 10*3/uL (ref 1.5–6.5)
NEUT%: 61.8 % (ref 39.0–75.0)
PLATELETS: 193 10*3/uL (ref 140–400)
RBC: 4.44 10*6/uL (ref 4.20–5.82)
RDW: 12.8 % (ref 11.0–14.6)
WBC: 4.3 10*3/uL (ref 4.0–10.3)

## 2015-05-16 LAB — COMPREHENSIVE METABOLIC PANEL (CC13)
ALBUMIN: 3.9 g/dL (ref 3.5–5.0)
ALT: 18 U/L (ref 0–55)
AST: 22 U/L (ref 5–34)
Alkaline Phosphatase: 82 U/L (ref 40–150)
Anion Gap: 12 mEq/L — ABNORMAL HIGH (ref 3–11)
BILIRUBIN TOTAL: 0.51 mg/dL (ref 0.20–1.20)
BUN: 13 mg/dL (ref 7.0–26.0)
CALCIUM: 9.2 mg/dL (ref 8.4–10.4)
CHLORIDE: 105 meq/L (ref 98–109)
CO2: 23 meq/L (ref 22–29)
Creatinine: 0.9 mg/dL (ref 0.7–1.3)
GLUCOSE: 102 mg/dL (ref 70–140)
Potassium: 3.7 mEq/L (ref 3.5–5.1)
Sodium: 140 mEq/L (ref 136–145)
Total Protein: 7.5 g/dL (ref 6.4–8.3)

## 2015-05-16 MED ORDER — SODIUM CHLORIDE 0.9 % IJ SOLN
10.0000 mL | INTRAMUSCULAR | Status: DC | PRN
  Administered 2015-05-16: 10 mL via INTRAVENOUS
  Filled 2015-05-16: qty 10

## 2015-05-16 MED ORDER — SODIUM CHLORIDE 0.9 % IJ SOLN
10.0000 mL | INTRAMUSCULAR | Status: DC | PRN
  Administered 2015-05-16: 10 mL
  Filled 2015-05-16: qty 10

## 2015-05-16 MED ORDER — SODIUM CHLORIDE 0.9 % IV SOLN
Freq: Once | INTRAVENOUS | Status: AC
  Administered 2015-05-16: 12:00:00 via INTRAVENOUS
  Filled 2015-05-16: qty 4

## 2015-05-16 MED ORDER — SODIUM CHLORIDE 0.9 % IV SOLN
Freq: Once | INTRAVENOUS | Status: AC
  Administered 2015-05-16: 11:00:00 via INTRAVENOUS

## 2015-05-16 MED ORDER — HEPARIN SOD (PORK) LOCK FLUSH 100 UNIT/ML IV SOLN
500.0000 [IU] | Freq: Once | INTRAVENOUS | Status: AC | PRN
  Administered 2015-05-16: 500 [IU]
  Filled 2015-05-16: qty 5

## 2015-05-16 MED ORDER — DOCETAXEL CHEMO INJECTION 160 MG/16ML
75.0000 mg/m2 | Freq: Once | INTRAVENOUS | Status: AC
  Administered 2015-05-16: 120 mg via INTRAVENOUS
  Filled 2015-05-16: qty 12

## 2015-05-16 NOTE — Patient Instructions (Signed)

## 2015-05-16 NOTE — Patient Instructions (Signed)
[place] Discharge Instructions for Patients Receiving Chemotherapy  Today you received the following chemotherapy agents TAXOTERE To help prevent nausea and vomiting after your treatment, we encourage you to take your nausea medication as prescribed.   If you develop nausea and vomiting that is not controlled by your nausea medication, call the clinic.   BELOW ARE SYMPTOMS THAT SHOULD BE REPORTED IMMEDIATELY:  *FEVER GREATER THAN 100.5 F  *CHILLS WITH OR WITHOUT FEVER  NAUSEA AND VOMITING THAT IS NOT CONTROLLED WITH YOUR NAUSEA MEDICATION  *UNUSUAL SHORTNESS OF BREATH  *UNUSUAL BRUISING OR BLEEDING  TENDERNESS IN MOUTH AND THROAT WITH OR WITHOUT PRESENCE OF ULCERS  *URINARY PROBLEMS  *BOWEL PROBLEMS  UNUSUAL RASH Items with * indicate a potential emergency and should be followed up as soon as possible.  Feel free to call the clinic you have any questions or concerns. The clinic phone number is [phone].  Please show the [place] at check-in to the Emergency Department and triage nurse.

## 2015-05-16 NOTE — Progress Notes (Signed)
Patient is here with dtr (who speaks [place]) and an interpreter. Reviewed patients medications with dtr.  He did not bring them today and unable to reconcile list.  Encouraged them to bring actual medications to next visit.  Reviewed nausea medications

## 2015-05-17 ENCOUNTER — Ambulatory Visit (HOSPITAL_BASED_OUTPATIENT_CLINIC_OR_DEPARTMENT_OTHER): Payer: PPO

## 2015-05-17 VITALS — BP 117/73 | HR 69 | Temp 97.8°F

## 2015-05-17 DIAGNOSIS — C61 Malignant neoplasm of prostate: Secondary | ICD-10-CM | POA: Diagnosis not present

## 2015-05-17 DIAGNOSIS — Z5189 Encounter for other specified aftercare: Secondary | ICD-10-CM

## 2015-05-17 DIAGNOSIS — C787 Secondary malignant neoplasm of liver and intrahepatic bile duct: Secondary | ICD-10-CM

## 2015-05-17 LAB — PSA: PSA: 222.3 ng/mL — ABNORMAL HIGH (ref <=4.00)

## 2015-05-17 MED ORDER — PEGFILGRASTIM INJECTION 6 MG/0.6ML ~~LOC~~
6.0000 mg | PREFILLED_SYRINGE | Freq: Once | SUBCUTANEOUS | Status: AC
Start: 2015-05-17 — End: 2015-05-17
  Administered 2015-05-17: 6 mg via SUBCUTANEOUS
  Filled 2015-05-17: qty 0.6

## 2015-05-22 ENCOUNTER — Telehealth: Payer: Self-pay | Admitting: *Deleted

## 2015-05-22 NOTE — Telephone Encounter (Signed)
This RN spoke with daughter regarding her dad's first chemotherapy. Daughter stated,"he is drinking and eating. No nausea, vomiting, diarrhea, constipation or pain." Instructed patient's daughter to call [facility], [phone], if she had any further questions or concerns.

## 2015-05-22 NOTE — Telephone Encounter (Signed)
-----   Message from Ignacia Felling, RN sent at 05/16/2015 12:28 PM EDT ----- Regarding: chemo follow up call  Dr. Acquanetta Chain 1st taxotere  Dr. Acquanetta Chain.  Pts. dtr phone [phone]

## 2015-06-06 ENCOUNTER — Ambulatory Visit: Payer: PPO

## 2015-06-06 ENCOUNTER — Ambulatory Visit (HOSPITAL_BASED_OUTPATIENT_CLINIC_OR_DEPARTMENT_OTHER): Payer: PPO

## 2015-06-06 ENCOUNTER — Other Ambulatory Visit (HOSPITAL_BASED_OUTPATIENT_CLINIC_OR_DEPARTMENT_OTHER): Payer: PPO

## 2015-06-06 ENCOUNTER — Ambulatory Visit (HOSPITAL_BASED_OUTPATIENT_CLINIC_OR_DEPARTMENT_OTHER): Payer: PPO | Admitting: Physician Assistant

## 2015-06-06 ENCOUNTER — Encounter: Payer: Self-pay | Admitting: Physician Assistant

## 2015-06-06 VITALS — BP 135/69 | HR 77 | Temp 98.6°F | Resp 18 | Ht 61.0 in | Wt 133.3 lb

## 2015-06-06 DIAGNOSIS — C787 Secondary malignant neoplasm of liver and intrahepatic bile duct: Secondary | ICD-10-CM

## 2015-06-06 DIAGNOSIS — Z95828 Presence of other vascular implants and grafts: Secondary | ICD-10-CM

## 2015-06-06 DIAGNOSIS — Z5111 Encounter for antineoplastic chemotherapy: Secondary | ICD-10-CM | POA: Diagnosis not present

## 2015-06-06 DIAGNOSIS — K769 Liver disease, unspecified: Secondary | ICD-10-CM

## 2015-06-06 DIAGNOSIS — C61 Malignant neoplasm of prostate: Secondary | ICD-10-CM | POA: Diagnosis not present

## 2015-06-06 LAB — COMPREHENSIVE METABOLIC PANEL (CC13)
ALK PHOS: 101 U/L (ref 40–150)
ALT: 24 U/L (ref 0–55)
AST: 18 U/L (ref 5–34)
Albumin: 3.8 g/dL (ref 3.5–5.0)
Anion Gap: 8 mEq/L (ref 3–11)
BILIRUBIN TOTAL: 0.53 mg/dL (ref 0.20–1.20)
BUN: 9.9 mg/dL (ref 7.0–26.0)
CO2: 29 meq/L (ref 22–29)
CREATININE: 0.8 mg/dL (ref 0.7–1.3)
Calcium: 9.5 mg/dL (ref 8.4–10.4)
Chloride: 105 mEq/L (ref 98–109)
EGFR: >90 mL/min/{1.73_m2} (ref >90)
Glucose: 105 mg/dl (ref 70–140)
Potassium: 3.9 mEq/L (ref 3.5–5.1)
Sodium: 141 mEq/L (ref 136–145)
Total Protein: 7.2 g/dL (ref 6.4–8.3)

## 2015-06-06 LAB — CBC WITH DIFFERENTIAL/PLATELET
BASO%: 0.7 % (ref 0.0–2.0)
Basophils Absolute: 0 10*3/uL (ref 0.0–0.1)
EOS ABS: 0 10*3/uL (ref 0.0–0.5)
EOS%: 0.4 % (ref 0.0–7.0)
HCT: 33.6 % — ABNORMAL LOW (ref 38.4–49.9)
HGB: 11.2 g/dL — ABNORMAL LOW (ref 13.0–17.1)
LYMPH%: 17.8 % (ref 14.0–49.0)
MCH: 27.1 pg — ABNORMAL LOW (ref 27.2–33.4)
MCHC: 33.3 g/dL (ref 32.0–36.0)
MCV: 81.2 fL (ref 79.3–98.0)
MONO#: 0.7 10*3/uL (ref 0.1–0.9)
MONO%: 11.8 % (ref 0.0–14.0)
NEUT#: 3.9 10*3/uL (ref 1.5–6.5)
NEUT%: 69.3 % (ref 39.0–75.0)
Platelets: 266 10*3/uL (ref 140–400)
RBC: 4.14 10*6/uL — AB (ref 4.20–5.82)
RDW: 13.8 % (ref 11.0–14.6)
WBC: 5.6 10*3/uL (ref 4.0–10.3)
lymph#: 1 10*3/uL (ref 0.9–3.3)

## 2015-06-06 MED ORDER — SODIUM CHLORIDE 0.9 % IJ SOLN
10.0000 mL | INTRAMUSCULAR | Status: DC | PRN
  Administered 2015-06-06: 10 mL via INTRAVENOUS
  Filled 2015-06-06: qty 10

## 2015-06-06 MED ORDER — HEPARIN SOD (PORK) LOCK FLUSH 100 UNIT/ML IV SOLN
500.0000 [IU] | Freq: Once | INTRAVENOUS | Status: AC | PRN
  Administered 2015-06-06: 500 [IU]
  Filled 2015-06-06: qty 5

## 2015-06-06 MED ORDER — SODIUM CHLORIDE 0.9 % IV SOLN
Freq: Once | INTRAVENOUS | Status: AC
  Administered 2015-06-06: 11:00:00 via INTRAVENOUS

## 2015-06-06 MED ORDER — SODIUM CHLORIDE 0.9 % IJ SOLN
10.0000 mL | INTRAMUSCULAR | Status: DC | PRN
  Administered 2015-06-06: 10 mL
  Filled 2015-06-06: qty 10

## 2015-06-06 MED ORDER — DOCETAXEL CHEMO INJECTION 160 MG/16ML
75.0000 mg/m2 | Freq: Once | INTRAVENOUS | Status: AC
  Administered 2015-06-06: 120 mg via INTRAVENOUS
  Filled 2015-06-06: qty 12

## 2015-06-06 MED ORDER — SODIUM CHLORIDE 0.9 % IV SOLN
Freq: Once | INTRAVENOUS | Status: AC
  Administered 2015-06-06: 11:00:00 via INTRAVENOUS
  Filled 2015-06-06: qty 4

## 2015-06-06 NOTE — Patient Instructions (Signed)

## 2015-06-06 NOTE — Progress Notes (Signed)
Hematology and Oncology Follow Up Visit  Jamaul Heist [id number] Jul 18, 1951 64 y.o. 06/06/2015 4:48 PM [name],[name], MDDewey, [name], MD   Principle Diagnosis: 64 year old gentleman with prostate cancer diagnosed in 2014. He presented with obstructive symptoms and found to have a Gleason score 4+4 = 8 prostate cancer with PSA of around 40.  Liver mass diagnosed in December 2015 with an elevated AFP of 9.3.  Prior Therapy: The patient was started on Firmagon on 06/27/2013. He subsequently received definitive radiation therapy for a planned dose of 75 gray completed in November 2014 under the care of Dr. Tammi Klippel. His PSA subsequently noted close to 0.33. Most recently his PSA was up to 11 on 10/29/2014 with a castrate levels of testosterone of 19. Staging workup did not reveal any bulky metastasis.   Current therapy: Docetaxel 75 mg/m given every 3 weeks with Neulasta support. Status post 1 cycle  Interim History:  Mr. Ridgley presents today for a follow-up visit. Since his last visit, he completed his first cycle of systemic chemotherapy with docetaxel with Neulasta support. He reports still feeling bloated and early satiety. He also had difficulty sleeping for few days after chemotherapy. He is accompanied today by his daughter and an interpreter. The daughter has questions as to "what if he stops chemotherapy, what happens". They have concerns about the quality of his life versus quantity of his life and may consider discontinuing chemotherapy. He is able to eat and keep food down. He has not had any major changes in his performance status. He has lost about 6 pounds since his last office visit.   He does not report any headaches or blurry vision or syncope. He does not report any fevers, chills, or sweats. His appetite is reasonable without any reflux but continues to report occasional bloating. He does not report any frequency urgency but does report dysuria and occasional hematuria. He does not  report any skeletal complaints of back pain shoulder pain or hip pain. He does not report any lymphadenopathy or petechiae. Rest of his review of systems unremarkable.   Medications: I have reviewed the patient's current medications.  Current Outpatient Prescriptions  Medication Sig Dispense Refill  . acetaminophen (TYLENOL) 325 MG tablet Take 650 mg by mouth every 6 (six) hours as needed.    . lidocaine-prilocaine (EMLA) cream Apply 1 application topically as needed. 30 g 0  . omeprazole (PRILOSEC) 20 MG capsule Take 20 mg by mouth daily.    . prochlorperazine (COMPAZINE) 10 MG tablet Take 1 tablet (10 mg total) by mouth every 6 (six) hours as needed for nausea or vomiting. 30 tablet 0  . solifenacin (VESICARE) 10 MG tablet Take 1 tablet (10 mg total) by mouth daily. 30 tablet 6  . bicalutamide (CASODEX) 50 MG tablet Take 1 tablet (50 mg total) by mouth daily. (Patient not taking: Reported on 05/06/2015) 90 tablet 1  . HYDROcodone-acetaminophen (NORCO/VICODIN) 5-325 MG per tablet Take 1-2 tablets by mouth every 6 (six) hours as needed for moderate pain. (Patient not taking: Reported on 06/06/2015) 20 tablet 0  . polyethylene glycol (MIRALAX) packet Take 17 g by mouth daily. (Patient not taking: Reported on 06/06/2015) 28 each 3   No current facility-administered medications for this visit.     Allergies:  Allergies  Allergen Reactions  . Aspirin Hives    Past Medical History, Surgical history, Social history, and Family History were reviewed and updated.    Physical Exam: Blood pressure 135/69, pulse 77, temperature 98.6 F (37 C),  temperature source Oral, resp. rate 18, height 5\' 1"  (1.549 m), weight 133 lb 4.8 oz (60.464 kg), SpO2 100 %. ECOG: 1 General appearance: alert and cooperative Head: Normocephalic, without obvious abnormality Neck: no adenopathy Lymph nodes: Cervical, supraclavicular, and axillary nodes normal. Heart:regular rate and rhythm, S1, S2 normal, no murmur,  click, rub or gallop Lung:chest clear, no wheezing, rales, normal symmetric air entry Abdomin: soft, non-tender, without masses or organomegaly. Slightly tender. Good bowel sounds no shifting dullness. EXT:no erythema, induration, or nodules Skin: dry skin noted without any erythema or induration.   Lab Results: Lab Results  Component Value Date   WBC 5.6 06/06/2015   HGB 11.2* 06/06/2015   HCT 33.6* 06/06/2015   MCV 81.2 06/06/2015   PLT 266 06/06/2015     Chemistry      Component Value Date/Time   NA 141 06/06/2015 0916   NA 138 04/15/2015 2219   K 3.9 06/06/2015 0916   K 4.5 04/15/2015 2219   CL 102 04/15/2015 2219   CO2 29 06/06/2015 0916   CO2 25 04/15/2015 2219   BUN 9.9 06/06/2015 0916   BUN 13 04/15/2015 2219   CREATININE 0.8 06/06/2015 0916   CREATININE 0.95 04/15/2015 2219      Component Value Date/Time   CALCIUM 9.5 06/06/2015 0916   CALCIUM 9.5 04/15/2015 2219   ALKPHOS 101 06/06/2015 0916   ALKPHOS 92 04/15/2015 2219   AST 18 06/06/2015 0916   AST 24 04/15/2015 2219   ALT 24 06/06/2015 0916   ALT 21 04/15/2015 2219   BILITOT 0.53 06/06/2015 0916   BILITOT 0.8 04/15/2015 2219            Impression and Plan:  64 year old gentleman with the following issues:  1. Prostate cancer diagnosed in June 2014. He presented with obstructive symptoms and found to have a Gleason score 4+4 = 8 prostate cancer with PSA of around 40. He was treated with androgen deprivation and radiation therapy with an excellent initial response and a PSA nadir of 0.33. PSA was up to 19 and Casodex was added.  Most recently his PSA was up to 120 and rapidly progressing at this time. He has documented hepatic metastasis at this time. I have instructed him to stop Casodex for the time being given his disease progression.  Treatment options were discussed and Dr. Alen Blew feltl his best choice would be systemic chemotherapy. Given his visceral metastasis and symptomatology, the  better option would be aggressive chemotherapy. The risks and benefits of Taxotere chemotherapy were previously discussed  extensively. Complications include nausea, vomiting, myelosuppression, neutropenia, neutropenic sepsis, peripheral neuropathy, lower extremity edema as well as infusion related toxicities were discussed. The benefit would be palliation of symptoms and possibly extending his overall survival.  Oral agents such as Zytiga or xtandi would be less effective in the setting of visceral metastasis. After discussing the risks and benefits he is agreeable to proceed after chemotherapy education class.   2. Liver masses: biopsy-proven to be prostate cancer and certainly carries a poor prognosis but he would be a reasonable candidate for therapy.   3.  IV access: Right anterior chest Port-A-Cath placed 05/07/2015.  4. Constipation: Continue MiraLAX to take daily for regular bowel movements.  5. Antiemetics: Prescription for Compazine was given to the patient today.  6. Neutropenia prophylaxis: Neulasta will be added to his chemotherapy regimen.   7. Follow-up: Will be in 3 weeks prior to the next cycle of chemotherapy   All his questions were  answered today to his satisfaction. He is accompanied today by his daughter and an interpreter.   Carlton Adam, [place]  6/16/20164:48 PM

## 2015-06-06 NOTE — Patient Instructions (Signed)
[facility] Discharge Instructions for Patients Receiving Chemotherapy  Today you received the following chemotherapy agents Taxotere.  To help prevent nausea and vomiting after your treatment, we encourage you to take your nausea medication as prescribed.   If you develop nausea and vomiting that is not controlled by your nausea medication, call the clinic.   BELOW ARE SYMPTOMS THAT SHOULD BE REPORTED IMMEDIATELY:  *FEVER GREATER THAN 100.5 F  *CHILLS WITH OR WITHOUT FEVER  NAUSEA AND VOMITING THAT IS NOT CONTROLLED WITH YOUR NAUSEA MEDICATION  *UNUSUAL SHORTNESS OF BREATH  *UNUSUAL BRUISING OR BLEEDING  TENDERNESS IN MOUTH AND THROAT WITH OR WITHOUT PRESENCE OF ULCERS  *URINARY PROBLEMS  *BOWEL PROBLEMS  UNUSUAL RASH Items with * indicate a potential emergency and should be followed up as soon as possible.  Feel free to call the clinic you have any questions or concerns. The clinic phone number is [phone].  Please show the CHEMO ALERT CARD at check-in to the Emergency Department and triage nurse.   

## 2015-06-07 ENCOUNTER — Ambulatory Visit (HOSPITAL_BASED_OUTPATIENT_CLINIC_OR_DEPARTMENT_OTHER): Payer: PPO

## 2015-06-07 VITALS — BP 125/69 | HR 71 | Temp 98.4°F

## 2015-06-07 DIAGNOSIS — C61 Malignant neoplasm of prostate: Secondary | ICD-10-CM

## 2015-06-07 DIAGNOSIS — C787 Secondary malignant neoplasm of liver and intrahepatic bile duct: Secondary | ICD-10-CM | POA: Diagnosis not present

## 2015-06-07 DIAGNOSIS — Z5189 Encounter for other specified aftercare: Secondary | ICD-10-CM | POA: Diagnosis not present

## 2015-06-07 LAB — PSA: PSA: 146.8 ng/mL — ABNORMAL HIGH (ref <=4.00)

## 2015-06-07 MED ORDER — PEGFILGRASTIM INJECTION 6 MG/0.6ML ~~LOC~~
6.0000 mg | PREFILLED_SYRINGE | Freq: Once | SUBCUTANEOUS | Status: AC
  Administered 2015-06-07: 6 mg via SUBCUTANEOUS
  Filled 2015-06-07: qty 0.6

## 2015-06-11 NOTE — Patient Instructions (Signed)
Continue labs and chemotherapy as scheduled Follow up in 3 weeks 

## 2015-06-27 ENCOUNTER — Other Ambulatory Visit (HOSPITAL_BASED_OUTPATIENT_CLINIC_OR_DEPARTMENT_OTHER): Payer: PPO

## 2015-06-27 ENCOUNTER — Telehealth: Payer: Self-pay | Admitting: Oncology

## 2015-06-27 ENCOUNTER — Ambulatory Visit (HOSPITAL_BASED_OUTPATIENT_CLINIC_OR_DEPARTMENT_OTHER): Payer: PPO | Admitting: Oncology

## 2015-06-27 ENCOUNTER — Ambulatory Visit: Payer: PPO

## 2015-06-27 ENCOUNTER — Ambulatory Visit (HOSPITAL_BASED_OUTPATIENT_CLINIC_OR_DEPARTMENT_OTHER): Payer: PPO

## 2015-06-27 VITALS — BP 114/76 | HR 80 | Temp 98.2°F | Resp 18 | Ht 61.0 in | Wt 132.6 lb

## 2015-06-27 DIAGNOSIS — R634 Abnormal weight loss: Secondary | ICD-10-CM | POA: Diagnosis not present

## 2015-06-27 DIAGNOSIS — C61 Malignant neoplasm of prostate: Secondary | ICD-10-CM

## 2015-06-27 DIAGNOSIS — Z95828 Presence of other vascular implants and grafts: Secondary | ICD-10-CM

## 2015-06-27 DIAGNOSIS — C787 Secondary malignant neoplasm of liver and intrahepatic bile duct: Secondary | ICD-10-CM

## 2015-06-27 DIAGNOSIS — D709 Neutropenia, unspecified: Secondary | ICD-10-CM | POA: Diagnosis not present

## 2015-06-27 DIAGNOSIS — Z5111 Encounter for antineoplastic chemotherapy: Secondary | ICD-10-CM | POA: Diagnosis not present

## 2015-06-27 LAB — CBC WITH DIFFERENTIAL/PLATELET
BASO%: 0.5 % (ref 0.0–2.0)
Basophils Absolute: 0 10*3/uL (ref 0.0–0.1)
EOS%: 0.2 % (ref 0.0–7.0)
Eosinophils Absolute: 0 10*3/uL (ref 0.0–0.5)
HCT: 31.5 % — ABNORMAL LOW (ref 38.4–49.9)
HGB: 10.4 g/dL — ABNORMAL LOW (ref 13.0–17.1)
LYMPH#: 0.7 10*3/uL — AB (ref 0.9–3.3)
LYMPH%: 11.4 % — ABNORMAL LOW (ref 14.0–49.0)
MCH: 27.3 pg (ref 27.2–33.4)
MCHC: 33 g/dL (ref 32.0–36.0)
MCV: 82.7 fL (ref 79.3–98.0)
MONO#: 0.6 10*3/uL (ref 0.1–0.9)
MONO%: 9.3 % (ref 0.0–14.0)
NEUT#: 4.9 10*3/uL (ref 1.5–6.5)
NEUT%: 78.6 % — ABNORMAL HIGH (ref 39.0–75.0)
Platelets: 236 10*3/uL (ref 140–400)
RBC: 3.81 10*6/uL — AB (ref 4.20–5.82)
RDW: 15 % — AB (ref 11.0–14.6)
WBC: 6.2 10*3/uL (ref 4.0–10.3)
nRBC: 0 % (ref 0–0)

## 2015-06-27 LAB — COMPREHENSIVE METABOLIC PANEL (CC13)
ALT: 19 U/L (ref 0–55)
ANION GAP: 10 meq/L (ref 3–11)
AST: 18 U/L (ref 5–34)
Albumin: 3.9 g/dL (ref 3.5–5.0)
Alkaline Phosphatase: 114 U/L (ref 40–150)
BUN: 8.2 mg/dL (ref 7.0–26.0)
CO2: 24 meq/L (ref 22–29)
Calcium: 9.6 mg/dL (ref 8.4–10.4)
Chloride: 105 mEq/L (ref 98–109)
Creatinine: 0.8 mg/dL (ref 0.7–1.3)
GLUCOSE: 135 mg/dL (ref 70–140)
POTASSIUM: 3.8 meq/L (ref 3.5–5.1)
Sodium: 140 mEq/L (ref 136–145)
Total Bilirubin: 0.78 mg/dL (ref 0.20–1.20)
Total Protein: 7.1 g/dL (ref 6.4–8.3)

## 2015-06-27 MED ORDER — HEPARIN SOD (PORK) LOCK FLUSH 100 UNIT/ML IV SOLN
500.0000 [IU] | Freq: Once | INTRAVENOUS | Status: AC
  Administered 2015-06-27: 500 [IU] via INTRAVENOUS
  Filled 2015-06-27: qty 5

## 2015-06-27 MED ORDER — SODIUM CHLORIDE 0.9 % IV SOLN
Freq: Once | INTRAVENOUS | Status: AC
  Administered 2015-06-27: 12:00:00 via INTRAVENOUS
  Filled 2015-06-27: qty 4

## 2015-06-27 MED ORDER — SODIUM CHLORIDE 0.9 % IJ SOLN
10.0000 mL | INTRAMUSCULAR | Status: DC | PRN
  Administered 2015-06-27: 10 mL via INTRAVENOUS
  Filled 2015-06-27: qty 10

## 2015-06-27 MED ORDER — SODIUM CHLORIDE 0.9 % IV SOLN
Freq: Once | INTRAVENOUS | Status: AC
  Administered 2015-06-27: 12:00:00 via INTRAVENOUS

## 2015-06-27 MED ORDER — DOCETAXEL CHEMO INJECTION 160 MG/16ML
75.0000 mg/m2 | Freq: Once | INTRAVENOUS | Status: AC
  Administered 2015-06-27: 120 mg via INTRAVENOUS
  Filled 2015-06-27: qty 12

## 2015-06-27 NOTE — Telephone Encounter (Signed)
per pof to sch pt appt-gave pt avs °

## 2015-06-27 NOTE — Patient Instructions (Signed)

## 2015-06-27 NOTE — Patient Instructions (Signed)
[facility] Discharge Instructions for Patients Receiving Chemotherapy  Today you received the following chemotherapy agents:  Taxotere.  To help prevent nausea and vomiting after your treatment, we encourage you to take your nausea medication as directed.   If you develop nausea and vomiting that is not controlled by your nausea medication, call the clinic.   BELOW ARE SYMPTOMS THAT SHOULD BE REPORTED IMMEDIATELY:  *FEVER GREATER THAN 100.5 F  *CHILLS WITH OR WITHOUT FEVER  NAUSEA AND VOMITING THAT IS NOT CONTROLLED WITH YOUR NAUSEA MEDICATION  *UNUSUAL SHORTNESS OF BREATH  *UNUSUAL BRUISING OR BLEEDING  TENDERNESS IN MOUTH AND THROAT WITH OR WITHOUT PRESENCE OF ULCERS  *URINARY PROBLEMS  *BOWEL PROBLEMS  UNUSUAL RASH Items with * indicate a potential emergency and should be followed up as soon as possible.  Feel free to call the clinic you have any questions or concerns. The clinic phone number is [phone].  Please show the CHEMO ALERT CARD at check-in to the Emergency Department and triage nurse.  

## 2015-06-27 NOTE — Progress Notes (Signed)
Hematology and Oncology Follow Up Visit  [name] Johnston [id number] [birth year] 64 y.o. 06/27/2015 11:51 AM Johnston,[name], MDDewey, [name], MD   Principle Diagnosis: 64 year old gentleman with prostate cancer diagnosed in 2014. He presented with obstructive symptoms and found to have a Gleason score 4+4 = 8 prostate cancer with PSA of around 40.  Liver mass diagnosed in December 2015 with an elevated AFP of 9.3.  Prior Therapy: The patient was started on Firmagon on 06/27/2013. He subsequently received definitive radiation therapy for a planned dose of 75 gray completed in November 2014 under the care of Dr. Tammi Johnston. His PSA subsequently noted close to 0.33. Most recently his PSA was up to 11 on 10/29/2014 with a castrate levels of testosterone of 19. Staging workup did not reveal any bulky metastasis.   Current therapy: Docetaxel 75 mg/m given every 3 weeks with Neulasta support. Cycle one given on 05/16/2015. He is here for cycle 3.  Interim History:  [name] Johnston presents today for a follow-up visit. Since his last visit, he continues on systemic chemotherapy with docetaxel with Neulasta support. He has tolerated chemotherapy with few complications. He has reported grade 2 fatigue but no nausea or vomiting. He does not report any peripheral neuropathy or infusion related complications. He reports still feeling bloated and early satiety. He also had difficulty sleeping for few days after chemotherapy. He is able to eat and keep food down. His appetite is reasonable but lost 6 pounds since last visit. He has not had any major changes in his performance status.   He does not report any headaches or blurry vision or syncope. He does not report any fevers, chills, or sweats. His appetite is reasonable without any reflux but continues to report occasional bloating. He does not report any frequency urgency but does report dysuria and occasional hematuria. He does not report any skeletal complaints of back pain  shoulder pain or hip pain. He does not report any lymphadenopathy or petechiae. Rest of his review of systems unremarkable.   Medications: I have reviewed the patient's current medications.  Current Outpatient Prescriptions  Medication Sig Dispense Refill  . acetaminophen (TYLENOL) 325 MG tablet Take 650 mg by mouth every 6 (six) hours as needed.    . bicalutamide (CASODEX) 50 MG tablet Take 1 tablet (50 mg total) by mouth daily. (Patient not taking: Reported on 05/06/2015) 90 tablet 1  . HYDROcodone-acetaminophen (NORCO/VICODIN) 5-325 MG per tablet Take 1-2 tablets by mouth every 6 (six) hours as needed for moderate pain. (Patient not taking: Reported on 06/06/2015) 20 tablet 0  . lidocaine-prilocaine (EMLA) cream Apply 1 application topically as needed. 30 g 0  . omeprazole (PRILOSEC) 20 MG capsule Take 20 mg by mouth daily.    . polyethylene glycol (MIRALAX) packet Take 17 g by mouth daily. (Patient not taking: Reported on 06/06/2015) 28 each 3  . prochlorperazine (COMPAZINE) 10 MG tablet Take 1 tablet (10 mg total) by mouth every 6 (six) hours as needed for nausea or vomiting. 30 tablet 0  . solifenacin (VESICARE) 10 MG tablet Take 1 tablet (10 mg total) by mouth daily. 30 tablet 6   No current facility-administered medications for this visit.     Allergies:  Allergies  Allergen Reactions  . Aspirin Hives    Past Medical History, Surgical history, Social history, and Family History were reviewed and updated.    Physical Exam: Blood pressure 114/76, pulse 80, temperature 98.2 F (36.8 C), temperature source Oral, resp. rate 18, height 5\' 1"  (  1.549 m), weight 132 lb 9.6 oz (60.147 kg), SpO2 100 %. ECOG: 1 General appearance: alert and cooperative. Chronically ill-appearing. Head: Normocephalic, without obvious abnormality Neck: no adenopathy Lymph nodes: Cervical, supraclavicular, and axillary nodes normal. Heart:regular rate and rhythm, S1, S2 normal, no murmur, click, rub or  gallop Lung:chest clear, no wheezing, rales, normal symmetric air entry Abdomin: soft, non-tender, without masses or organomegaly.  Good bowel sounds no shifting dullness. EXT:no erythema, induration, or nodules Skin: dry skin noted without any erythema or induration.   Lab Results: Lab Results  Component Value Date   WBC 6.2 06/27/2015   HGB 10.4* 06/27/2015   HCT 31.5* 06/27/2015   MCV 82.7 06/27/2015   PLT 236 06/27/2015     Chemistry      Component Value Date/Time   NA 140 06/27/2015 1103   NA 138 04/15/2015 2219   K 3.8 06/27/2015 1103   K 4.5 04/15/2015 2219   CL 102 04/15/2015 2219   CO2 24 06/27/2015 1103   CO2 25 04/15/2015 2219   BUN 8.2 06/27/2015 1103   BUN 13 04/15/2015 2219   CREATININE 0.8 06/27/2015 1103   CREATININE 0.95 04/15/2015 2219      Component Value Date/Time   CALCIUM 9.6 06/27/2015 1103   CALCIUM 9.5 04/15/2015 2219   ALKPHOS 114 06/27/2015 1103   ALKPHOS 92 04/15/2015 2219   AST 18 06/27/2015 1103   AST 24 04/15/2015 2219   ALT 19 06/27/2015 1103   ALT 21 04/15/2015 2219   BILITOT 0.78 06/27/2015 1103   BILITOT 0.8 04/15/2015 2219       Results for [name] Johnston, [name] Johnston (MRN [id number]) as of 06/27/2015 11:34  Ref. Range 02/13/2015 12:16 04/10/2015 14:56 05/16/2015 09:59 06/06/2015 09:16  PSA Latest Ref Range: <=4.00 ng/mL 48.41 (H) 120.10 (H) 222.30 (H) 146.80 (H)       Impression and Plan:  64 year old gentleman with the following issues:  1. Prostate cancer diagnosed in June 2014. He presented with obstructive symptoms and found to have a Gleason score 4+4 = 8 prostate cancer with PSA of around 40. He was treated with androgen deprivation and radiation therapy with an excellent initial response and a PSA nadir of 0.33. PSA was up to 19 and Casodex was added.  He developed progression of disease with PSA up to 222 with liver metastasis.  He is currently on Taxotere chemotherapy and received 2 cycles. He has tolerated chemotherapy with few  complications that are manageable. The plan is to proceed with cycle 3 without any dose reduction or delay. The plan is to get him to 10 cycles of possible. We can consider dose reductions if he develops any other complications.  The rationale of continuing chemotherapy versus discontinuation were discussed today. He understands that without systemic chemotherapy he will develop progressive cancer that will be quite debilitating rather quickly. He is agreeable to proceed at least for the time being.  2. Liver masses: biopsy-proven to be prostate cancer without any evidence of hepatocellular carcinoma.  3.  IV access: Right anterior chest Port-A-Cath placed 05/07/2015. No complications noted.  4. Weight loss: I continue to encourage him to use nutritional supplements to boost his intake and maintain his weight at a reasonable level.  5. Antiemetics: Prescription for Compazine was given on previous visits.  6. Neutropenia prophylaxis: Neulasta will be added to his chemotherapy regimen.   7. Follow-up: Will be in 3 weeks prior to the next cycle of chemotherapy     [name] Rollo, MD 7/7/201611:51 AM

## 2015-06-27 NOTE — Addendum Note (Signed)
Addended by: Randolm Idol on: 06/27/2015 12:06 PM   Modules accepted: Medications

## 2015-06-28 ENCOUNTER — Ambulatory Visit (HOSPITAL_BASED_OUTPATIENT_CLINIC_OR_DEPARTMENT_OTHER): Payer: PPO

## 2015-06-28 VITALS — BP 114/73 | HR 75 | Temp 98.2°F

## 2015-06-28 DIAGNOSIS — C787 Secondary malignant neoplasm of liver and intrahepatic bile duct: Secondary | ICD-10-CM

## 2015-06-28 DIAGNOSIS — C61 Malignant neoplasm of prostate: Secondary | ICD-10-CM

## 2015-06-28 DIAGNOSIS — Z5189 Encounter for other specified aftercare: Secondary | ICD-10-CM

## 2015-06-28 LAB — PSA: PSA: 110.6 ng/mL — ABNORMAL HIGH (ref <=4.00)

## 2015-06-28 MED ORDER — PEGFILGRASTIM INJECTION 6 MG/0.6ML ~~LOC~~
6.0000 mg | PREFILLED_SYRINGE | Freq: Once | SUBCUTANEOUS | Status: AC
  Administered 2015-06-28: 6 mg via SUBCUTANEOUS
  Filled 2015-06-28: qty 0.6

## 2015-07-10 ENCOUNTER — Ambulatory Visit: Payer: PPO | Admitting: Oncology

## 2015-07-10 ENCOUNTER — Other Ambulatory Visit: Payer: PPO

## 2015-07-18 ENCOUNTER — Telehealth: Payer: Self-pay | Admitting: *Deleted

## 2015-07-18 ENCOUNTER — Ambulatory Visit (HOSPITAL_BASED_OUTPATIENT_CLINIC_OR_DEPARTMENT_OTHER): Payer: PPO

## 2015-07-18 ENCOUNTER — Other Ambulatory Visit (HOSPITAL_BASED_OUTPATIENT_CLINIC_OR_DEPARTMENT_OTHER): Payer: PPO

## 2015-07-18 ENCOUNTER — Encounter: Payer: Self-pay | Admitting: Physician Assistant

## 2015-07-18 ENCOUNTER — Ambulatory Visit (HOSPITAL_BASED_OUTPATIENT_CLINIC_OR_DEPARTMENT_OTHER): Payer: PPO | Admitting: Physician Assistant

## 2015-07-18 ENCOUNTER — Telehealth: Payer: Self-pay | Admitting: Oncology

## 2015-07-18 VITALS — BP 130/59 | HR 76 | Temp 98.8°F | Resp 18 | Ht 61.0 in | Wt 134.6 lb

## 2015-07-18 DIAGNOSIS — C787 Secondary malignant neoplasm of liver and intrahepatic bile duct: Secondary | ICD-10-CM

## 2015-07-18 DIAGNOSIS — R634 Abnormal weight loss: Secondary | ICD-10-CM

## 2015-07-18 DIAGNOSIS — R300 Dysuria: Secondary | ICD-10-CM | POA: Diagnosis not present

## 2015-07-18 DIAGNOSIS — C61 Malignant neoplasm of prostate: Secondary | ICD-10-CM | POA: Diagnosis not present

## 2015-07-18 DIAGNOSIS — Z5111 Encounter for antineoplastic chemotherapy: Secondary | ICD-10-CM | POA: Diagnosis not present

## 2015-07-18 LAB — CBC WITH DIFFERENTIAL/PLATELET
BASO%: 0.4 % (ref 0.0–2.0)
Basophils Absolute: 0 10*3/uL (ref 0.0–0.1)
EOS ABS: 0 10*3/uL (ref 0.0–0.5)
EOS%: 0.2 % (ref 0.0–7.0)
HCT: 31.4 % — ABNORMAL LOW (ref 38.4–49.9)
HGB: 10.2 g/dL — ABNORMAL LOW (ref 13.0–17.1)
LYMPH%: 17.5 % (ref 14.0–49.0)
MCH: 27.6 pg (ref 27.2–33.4)
MCHC: 32.5 g/dL (ref 32.0–36.0)
MCV: 85.1 fL (ref 79.3–98.0)
MONO#: 0.7 10*3/uL (ref 0.1–0.9)
MONO%: 12.9 % (ref 0.0–14.0)
NEUT#: 3.8 10*3/uL (ref 1.5–6.5)
NEUT%: 69 % (ref 39.0–75.0)
PLATELETS: 228 10*3/uL (ref 140–400)
RBC: 3.69 10*6/uL — ABNORMAL LOW (ref 4.20–5.82)
RDW: 16.8 % — AB (ref 11.0–14.6)
WBC: 5.4 10*3/uL (ref 4.0–10.3)
lymph#: 1 10*3/uL (ref 0.9–3.3)

## 2015-07-18 LAB — COMPREHENSIVE METABOLIC PANEL (CC13)
ALBUMIN: 3.9 g/dL (ref 3.5–5.0)
ALT: 29 U/L (ref 0–55)
ANION GAP: 7 meq/L (ref 3–11)
AST: 25 U/L (ref 5–34)
Alkaline Phosphatase: 162 U/L — ABNORMAL HIGH (ref 40–150)
BUN: 9.9 mg/dL (ref 7.0–26.0)
CHLORIDE: 107 meq/L (ref 98–109)
CO2: 27 meq/L (ref 22–29)
Calcium: 9.2 mg/dL (ref 8.4–10.4)
Creatinine: 0.8 mg/dL (ref 0.7–1.3)
EGFR: >90 mL/min/{1.73_m2} (ref >90)
Glucose: 101 mg/dl (ref 70–140)
Potassium: 4.2 mEq/L (ref 3.5–5.1)
SODIUM: 140 meq/L (ref 136–145)
Total Bilirubin: 0.72 mg/dL (ref 0.20–1.20)
Total Protein: 7 g/dL (ref 6.4–8.3)

## 2015-07-18 LAB — URINALYSIS, MICROSCOPIC - CHCC
Bilirubin (Urine): NEGATIVE
COMMENTS:: 5
Glucose: NEGATIVE mg/dL
Ketones: NEGATIVE mg/dL
Leukocyte Esterase: NEGATIVE
Nitrite: NEGATIVE
PH: 7.5 (ref 4.6–8.0)
Protein: NEGATIVE mg/dL
Specific Gravity, Urine: 1.01 (ref 1.003–1.035)
Urobilinogen, UR: 0.2 mg/dL (ref 0.2–1)

## 2015-07-18 MED ORDER — SODIUM CHLORIDE 0.9 % IV SOLN
Freq: Once | INTRAVENOUS | Status: AC
  Administered 2015-07-18: 14:00:00 via INTRAVENOUS
  Filled 2015-07-18: qty 4

## 2015-07-18 MED ORDER — DOCETAXEL CHEMO INJECTION 160 MG/16ML
75.0000 mg/m2 | Freq: Once | INTRAVENOUS | Status: AC
  Administered 2015-07-18: 120 mg via INTRAVENOUS
  Filled 2015-07-18: qty 12

## 2015-07-18 MED ORDER — SODIUM CHLORIDE 0.9 % IJ SOLN
10.0000 mL | INTRAMUSCULAR | Status: DC | PRN
  Administered 2015-07-18: 10 mL
  Filled 2015-07-18: qty 10

## 2015-07-18 MED ORDER — HEPARIN SOD (PORK) LOCK FLUSH 100 UNIT/ML IV SOLN
500.0000 [IU] | Freq: Once | INTRAVENOUS | Status: AC | PRN
  Administered 2015-07-18: 500 [IU]
  Filled 2015-07-18: qty 5

## 2015-07-18 MED ORDER — SODIUM CHLORIDE 0.9 % IV SOLN
Freq: Once | INTRAVENOUS | Status: AC
  Administered 2015-07-18: 13:00:00 via INTRAVENOUS

## 2015-07-18 NOTE — Telephone Encounter (Signed)
per pof to sch pt appt-gave pt copy of avs-sent MW email to sch pt trmt per pof-adv pt will call after reply

## 2015-07-18 NOTE — Telephone Encounter (Signed)
Per staff message and POF I have scheduled appts. Advised scheduler of appts. JMW  

## 2015-07-18 NOTE — Patient Instructions (Signed)
Docetaxel injection  What is this medicine?  DOCETAXEL (doe se TAX el) is a chemotherapy drug. It targets fast dividing cells, like cancer cells, and causes these cells to die. This medicine is used to treat many types of cancers like breast cancer, certain stomach cancers, head and neck cancer, lung cancer, and prostate cancer.  This medicine may be used for other purposes; ask your health care provider or pharmacist if you have questions.  COMMON BRAND NAME(S): Docefrez, Taxotere  What should I tell my health care provider before I take this medicine?  They need to know if you have any of these conditions:  -infection (especially a virus infection such as chickenpox, cold sores, or herpes)  -liver disease  -low blood counts, like low white cell, platelet, or red cell counts  -an unusual or allergic reaction to docetaxel, polysorbate 80, other chemotherapy agents, other medicines, foods, dyes, or preservatives  -pregnant or trying to get pregnant  -breast-feeding  How should I use this medicine?  This drug is given as an infusion into a vein. It is administered in a hospital or clinic by a specially trained health care professional.  Talk to your pediatrician regarding the use of this medicine in children. Special care may be needed.  Overdosage: If you think you have taken too much of this medicine contact a poison control center or emergency room at once.  NOTE: This medicine is only for you. Do not share this medicine with others.  What if I miss a dose?  It is important not to miss your dose. Call your doctor or health care professional if you are unable to keep an appointment.  What may interact with this medicine?  -cyclosporine  -erythromycin  -ketoconazole  -medicines to increase blood counts like filgrastim, pegfilgrastim, sargramostim  -vaccines  Talk to your doctor or health care professional before taking any of these medicines:  -acetaminophen  -aspirin  -ibuprofen  -ketoprofen  -naproxen  This list  may not describe all possible interactions. Give your health care provider a list of all the medicines, herbs, non-prescription drugs, or dietary supplements you use. Also tell them if you smoke, drink alcohol, or use illegal drugs. Some items may interact with your medicine.  What should I watch for while using this medicine?  Your condition will be monitored carefully while you are receiving this medicine. You will need important blood work done while you are taking this medicine.  This drug may make you feel generally unwell. This is not uncommon, as chemotherapy can affect healthy cells as well as cancer cells. Report any side effects. Continue your course of treatment even though you feel ill unless your doctor tells you to stop.  In some cases, you may be given additional medicines to help with side effects. Follow all directions for their use.  Call your doctor or health care professional for advice if you get a fever, chills or sore throat, or other symptoms of a cold or flu. Do not treat yourself. This drug decreases your body's ability to fight infections. Try to avoid being around people who are sick.  This medicine may increase your risk to bruise or bleed. Call your doctor or health care professional if you notice any unusual bleeding.  Be careful brushing and flossing your teeth or using a toothpick because you may get an infection or bleed more easily. If you have any dental work done, tell your dentist you are receiving this medicine.  Avoid taking products that   contain aspirin, acetaminophen, ibuprofen, naproxen, or ketoprofen unless instructed by your doctor. These medicines may hide a fever.  This medicine contains an alcohol in the product. You may get drowsy or dizzy. Do not drive, use machinery, or do anything that needs mental alertness until you know how this medicine affects you. Do not stand or sit up quickly, especially if you are an older patient. This reduces the risk of dizzy or  fainting spells. Avoid alcoholic drinks  Do not become pregnant while taking this medicine. Women should inform their doctor if they wish to become pregnant or think they might be pregnant. There is a potential for serious side effects to an unborn child. Talk to your health care professional or pharmacist for more information. Do not breast-feed an infant while taking this medicine.  What side effects may I notice from receiving this medicine?  Side effects that you should report to your doctor or health care professional as soon as possible:  -allergic reactions like skin rash, itching or hives, swelling of the face, lips, or tongue  -low blood counts - This drug may decrease the number of white blood cells, red blood cells and platelets. You may be at increased risk for infections and bleeding.  -signs of infection - fever or chills, cough, sore throat, pain or difficulty passing urine  -signs of decreased platelets or bleeding - bruising, pinpoint red spots on the skin, black, tarry stools, nosebleeds  -signs of decreased red blood cells - unusually weak or tired, fainting spells, lightheadedness  -breathing problems  -fast or irregular heartbeat  -low blood pressure  -mouth sores  -nausea and vomiting  -pain, swelling, redness or irritation at the injection site  -pain, tingling, numbness in the hands or feet  -swelling of the ankle, feet, hands  -weight gain  Side effects that usually do not require medical attention (report to your prescriber or health care professional if they continue or are bothersome):  -bone pain  -complete hair loss including hair on your head, underarms, pubic hair, eyebrows, and eyelashes  -diarrhea  -excessive tearing  -changes in the color of fingernails  -loosening of the fingernails  -nausea  -muscle pain  -red flush to skin  -sweating  -weak or tired  This list may not describe all possible side effects. Call your doctor for medical advice about side effects. You may report side  effects to FDA at 1-800-FDA-1088.  Where should I keep my medicine?  This drug is given in a hospital or clinic and will not be stored at home.  NOTE: This sheet is a summary. It may not cover all possible information. If you have questions about this medicine, talk to your doctor, pharmacist, or health care provider.   2015, [employer]vier/Gold Standard. (2013-11-02 22:21:02)

## 2015-07-18 NOTE — Progress Notes (Signed)
Hematology and Oncology Follow Up Visit  [name] Johnston [id number] 02-04-1951 64 y.o. 07/18/2015 4:36 PM [name] Johnston, MDDewey, [name], MD   Principle Diagnosis: 64 year old gentleman with prostate cancer diagnosed in 2014. He presented with obstructive symptoms and found to have a Gleason score 4+4 = 8 prostate cancer with PSA of around 40.  Liver mass diagnosed in December 2015 with an elevated AFP of 9.3.  Prior Therapy: The patient was started on Firmagon on 06/27/2013. He subsequently received definitive radiation therapy for a planned dose of 75 gray completed in November 2014 under the care of Dr. Tammi Klippel. His PSA subsequently noted close to 0.33. Most recently his PSA was up to 11 on 10/29/2014 with a castrate levels of testosterone of 19. Staging workup did not reveal any bulky metastasis.   Current therapy: Docetaxel 75 mg/m given every 3 weeks with Neulasta support. Cycle one given on 05/16/2015. He is here for cycle 4.  Interim History:  [name] Johnston presents today for a follow-up visit. Since his last visit, he continues on systemic chemotherapy with docetaxel with Neulasta support. He has tolerated chemotherapy with few complications. He has reported grade 2 fatigue but no nausea or vomiting. He does not report any peripheral neuropathy or infusion related complications. He complains of some dysuria and recently saw some blood in his urine. Denies fever or chills. He still has difficulty sleeping for few days after chemotherapy. He is able to eat and keep food down. His appetite is reasonable and he has gained a little weight recently. He has not had any major changes in his performance status.   He does not report any headaches or blurry vision or syncope. He does not report any fevers, chills, or sweats. His appetite is reasonable without any reflux but continues to report occasional bloating. He does not report any frequency urgency but does report dysuria and occasional hematuria. He  does not report any skeletal complaints of back pain shoulder pain or hip pain. He does not report any lymphadenopathy or petechiae. Remainder of his review of systems unremarkable.   Medications: I have reviewed the patient's current medications.  Current Outpatient Prescriptions  Medication Sig Dispense Refill  . acetaminophen (TYLENOL) 325 MG tablet Take 650 mg by mouth every 6 (six) hours as needed.    . bicalutamide (CASODEX) 50 MG tablet Take 1 tablet (50 mg total) by mouth daily. 90 tablet 1  . HYDROcodone-acetaminophen (NORCO/VICODIN) 5-325 MG per tablet Take 1-2 tablets by mouth every 6 (six) hours as needed for moderate pain. 20 tablet 0  . lidocaine-prilocaine (EMLA) cream Apply 1 application topically as needed. 30 g 0  . omeprazole (PRILOSEC) 20 MG capsule Take 20 mg by mouth daily.    . polyethylene glycol (MIRALAX) packet Take 17 g by mouth daily. 28 each 3  . prochlorperazine (COMPAZINE) 10 MG tablet Take 1 tablet (10 mg total) by mouth every 6 (six) hours as needed for nausea or vomiting. 30 tablet 0  . solifenacin (VESICARE) 10 MG tablet Take 1 tablet (10 mg total) by mouth daily. 30 tablet 6   No current facility-administered medications for this visit.   Facility-Administered Medications Ordered in Other Visits  Medication Dose Route Frequency Provider Last Rate Last Dose  . sodium chloride 0.9 % injection 10 mL  10 mL Intracatheter PRN Wyatt Portela, MD   10 mL at 07/18/15 1555     Allergies:  Allergies  Allergen Reactions  . Aspirin Hives    Past Medical History,  Surgical history, Social history, and Family History were reviewed and updated.    Physical Exam: Blood pressure 130/59, pulse 76, temperature 98.8 F (37.1 C), temperature source Oral, resp. rate 18, height 5\' 1"  (1.549 m), weight 134 lb 9.6 oz (61.054 kg), SpO2 100 %. ECOG: 1 General appearance: alert and cooperative. Chronically ill-appearing. Head: Normocephalic, without obvious  abnormality Neck: no adenopathy Lymph nodes: Cervical, supraclavicular, and axillary nodes normal. Heart:regular rate and rhythm, S1, S2 normal, no murmur, click, rub or gallop Lung:chest clear, no wheezing, rales, normal symmetric air entry Abdomin: soft, non-tender, without masses or organomegaly.  Good bowel sounds no shifting dullness. EXT:no erythema, induration, or nodules Skin: dry skin noted without any erythema or induration.   Lab Results: Lab Results  Component Value Date   WBC 5.4 07/18/2015   HGB 10.2* 07/18/2015   HCT 31.4* 07/18/2015   MCV 85.1 07/18/2015   PLT 228 07/18/2015     Chemistry      Component Value Date/Time   NA 140 07/18/2015 1000   NA 138 04/15/2015 2219   K 4.2 07/18/2015 1000   K 4.5 04/15/2015 2219   CL 102 04/15/2015 2219   CO2 27 07/18/2015 1000   CO2 25 04/15/2015 2219   BUN 9.9 07/18/2015 1000   BUN 13 04/15/2015 2219   CREATININE 0.8 07/18/2015 1000   CREATININE 0.95 04/15/2015 2219      Component Value Date/Time   CALCIUM 9.2 07/18/2015 1000   CALCIUM 9.5 04/15/2015 2219   ALKPHOS 162* 07/18/2015 1000   ALKPHOS 92 04/15/2015 2219   AST 25 07/18/2015 1000   AST 24 04/15/2015 2219   ALT 29 07/18/2015 1000   ALT 21 04/15/2015 2219   BILITOT 0.72 07/18/2015 1000   BILITOT 0.8 04/15/2015 2219       Results for [name] Johnston (MRN [id number]) as of 06/27/2015 11:34  Ref. Range 02/13/2015 12:16 04/10/2015 14:56 05/16/2015 09:59 06/06/2015 09:16  PSA Latest Ref Range: <=4.00 ng/mL 48.41 (H) 120.10 (H) 222.30 (H) 146.80 (H)       Impression and Plan:  64 year old gentleman with the following issues:  1. Prostate cancer diagnosed in June 2014. He presented with obstructive symptoms and found to have a Gleason score 4+4 = 8 prostate cancer with PSA of around 40. He was treated with androgen deprivation and radiation therapy with an excellent initial response and a PSA nadir of 0.33. PSA was up to 19 and Casodex was added.  He developed  progression of disease with PSA up to 222 with liver metastasis.  He is currently on Taxotere chemotherapy and received 2 cycles. He has tolerated chemotherapy with few complications that are manageable. The plan is to proceed with cycle 4 without any dose reduction or delay. The plan is to get him to 10 cycles of possible. We can consider dose reductions if he develops any other complications.  The rationale of continuing chemotherapy versus discontinuation were discussed today. He understands that without systemic chemotherapy he will develop progressive cancer that will be quite debilitating rather quickly. He is agreeable to proceed at least for the time being.  2. Liver masses: biopsy-proven to be prostate cancer without any evidence of hepatocellular carcinoma.  3.  IV access: Right anterior chest Port-A-Cath placed 05/07/2015. No complications noted.  4. Weight loss: I continue to encourage him to use nutritional supplements to boost his intake and maintain his weight at a reasonable level.  5. Antiemetics: Prescription for Compazine was given on previous visits.  6. Neutropenia prophylaxis: Neulasta will be added to his chemotherapy regimen.   7. Dysuria: Patient sure will be sent for a clean catch urinalysis and urine culture and sensitivity. She had evidence of infection be present, appropriate antibody therapy will be instituted.  8. Follow-up: Will be in 3 weeks prior to the next cycle of chemotherapy     Carlton Adam, PA-C  7/28/20164:36 PM

## 2015-07-19 ENCOUNTER — Telehealth: Payer: Self-pay | Admitting: Oncology

## 2015-07-19 ENCOUNTER — Ambulatory Visit (HOSPITAL_BASED_OUTPATIENT_CLINIC_OR_DEPARTMENT_OTHER): Payer: PPO

## 2015-07-19 VITALS — BP 115/64 | HR 87 | Temp 98.8°F

## 2015-07-19 DIAGNOSIS — Z5189 Encounter for other specified aftercare: Secondary | ICD-10-CM | POA: Diagnosis not present

## 2015-07-19 DIAGNOSIS — C61 Malignant neoplasm of prostate: Secondary | ICD-10-CM | POA: Diagnosis not present

## 2015-07-19 DIAGNOSIS — C787 Secondary malignant neoplasm of liver and intrahepatic bile duct: Secondary | ICD-10-CM

## 2015-07-19 LAB — PSA: PSA: 14.89 ng/mL — ABNORMAL HIGH (ref <=4.00)

## 2015-07-19 MED ORDER — PEGFILGRASTIM INJECTION 6 MG/0.6ML ~~LOC~~
6.0000 mg | PREFILLED_SYRINGE | Freq: Once | SUBCUTANEOUS | Status: AC
  Administered 2015-07-19: 6 mg via SUBCUTANEOUS
  Filled 2015-07-19: qty 0.6

## 2015-07-19 NOTE — Telephone Encounter (Signed)
per pof to sch pt appt-cld tp & lef t message of time & date of appt

## 2015-07-19 NOTE — Patient Instructions (Signed)
Pegfilgrastim injection What is this medicine? PEGFILGRASTIM (peg fil GRA stim) is a long-acting granulocyte colony-stimulating factor that stimulates the growth of neutrophils, a type of white blood cell important in the body's fight against infection. It is used to reduce the incidence of fever and infection in patients with certain types of cancer who are receiving chemotherapy that affects the bone marrow. This medicine may be used for other purposes; ask your health care provider or pharmacist if you have questions. COMMON BRAND NAME(S): Neulasta What should I tell my health care provider before I take this medicine? They need to know if you have any of these conditions: -latex allergy -ongoing radiation therapy -sickle cell disease -skin reactions to acrylic adhesives (On-Body Injector only) -an unusual or allergic reaction to pegfilgrastim, filgrastim, other medicines, foods, dyes, or preservatives -pregnant or trying to get pregnant -breast-feeding How should I use this medicine? This medicine is for injection under the skin. If you get this medicine at home, you will be taught how to prepare and give the pre-filled syringe or how to use the On-body Injector. Refer to the patient Instructions for Use for detailed instructions. Use exactly as directed. Take your medicine at regular intervals. Do not take your medicine more often than directed. It is important that you put your used needles and syringes in a special sharps container. Do not put them in a trash can. If you do not have a sharps container, call your pharmacist or healthcare provider to get one. Talk to your pediatrician regarding the use of this medicine in children. Special care may be needed. Overdosage: If you think you have taken too much of this medicine contact a poison control center or emergency room at once. NOTE: This medicine is only for you. Do not share this medicine with others. What if I miss a dose? It is  important not to miss your dose. Call your doctor or health care professional if you miss your dose. If you miss a dose due to an On-body Injector failure or leakage, a new dose should be administered as soon as possible using a single prefilled syringe for manual use. What may interact with this medicine? Interactions have not been studied. Give your health care provider a list of all the medicines, herbs, non-prescription drugs, or dietary supplements you use. Also tell them if you smoke, drink alcohol, or use illegal drugs. Some items may interact with your medicine. This list may not describe all possible interactions. Give your health care provider a list of all the medicines, herbs, non-prescription drugs, or dietary supplements you use. Also tell them if you smoke, drink alcohol, or use illegal drugs. Some items may interact with your medicine. What should I watch for while using this medicine? You may need blood work done while you are taking this medicine. If you are going to need a MRI, CT scan, or other procedure, tell your doctor that you are using this medicine (On-Body Injector only). What side effects may I notice from receiving this medicine? Side effects that you should report to your doctor or health care professional as soon as possible: -allergic reactions like skin rash, itching or hives, swelling of the face, lips, or tongue -dizziness -fever -pain, redness, or irritation at site where injected -pinpoint red spots on the skin -shortness of breath or breathing problems -stomach or side pain, or pain at the shoulder -swelling -tiredness -trouble passing urine Side effects that usually do not require medical attention (report to your doctor   or health care professional if they continue or are bothersome): -bone pain -muscle pain This list may not describe all possible side effects. Call your doctor for medical advice about side effects. You may report side effects to FDA at  1-800-FDA-1088. Where should I keep my medicine? Keep out of the reach of children. Store pre-filled syringes in a refrigerator between 2 and 8 degrees C (36 and 46 degrees F). Do not freeze. Keep in carton to protect from light. Throw away this medicine if it is left out of the refrigerator for more than 48 hours. Throw away any unused medicine after the expiration date. NOTE: This sheet is a summary. It may not cover all possible information. If you have questions about this medicine, talk to your doctor, pharmacist, or health care provider.  2015, [employer]vier/Gold Standard. (2014-03-08 16:14:05)  

## 2015-07-20 LAB — URINE CULTURE

## 2015-07-20 NOTE — Patient Instructions (Signed)
Follow-up in 3 weeks

## 2015-08-08 ENCOUNTER — Ambulatory Visit (HOSPITAL_BASED_OUTPATIENT_CLINIC_OR_DEPARTMENT_OTHER): Payer: PPO

## 2015-08-08 ENCOUNTER — Telehealth: Payer: Self-pay | Admitting: Oncology

## 2015-08-08 ENCOUNTER — Other Ambulatory Visit (HOSPITAL_BASED_OUTPATIENT_CLINIC_OR_DEPARTMENT_OTHER): Payer: PPO

## 2015-08-08 ENCOUNTER — Ambulatory Visit (HOSPITAL_BASED_OUTPATIENT_CLINIC_OR_DEPARTMENT_OTHER): Payer: PPO | Admitting: Oncology

## 2015-08-08 VITALS — BP 117/54 | HR 86 | Temp 98.4°F | Resp 18 | Ht 61.0 in | Wt 133.1 lb

## 2015-08-08 DIAGNOSIS — Z5111 Encounter for antineoplastic chemotherapy: Secondary | ICD-10-CM | POA: Diagnosis not present

## 2015-08-08 DIAGNOSIS — C787 Secondary malignant neoplasm of liver and intrahepatic bile duct: Secondary | ICD-10-CM

## 2015-08-08 DIAGNOSIS — C61 Malignant neoplasm of prostate: Secondary | ICD-10-CM

## 2015-08-08 DIAGNOSIS — R634 Abnormal weight loss: Secondary | ICD-10-CM

## 2015-08-08 DIAGNOSIS — R300 Dysuria: Secondary | ICD-10-CM

## 2015-08-08 LAB — COMPREHENSIVE METABOLIC PANEL (CC13)
ALT: 12 U/L (ref 0–55)
AST: 16 U/L (ref 5–34)
Albumin: 3.7 g/dL (ref 3.5–5.0)
Alkaline Phosphatase: 108 U/L (ref 40–150)
Anion Gap: 8 mEq/L (ref 3–11)
BUN: 12.7 mg/dL (ref 7.0–26.0)
CO2: 25 mEq/L (ref 22–29)
Calcium: 9.2 mg/dL (ref 8.4–10.4)
Chloride: 107 mEq/L (ref 98–109)
Creatinine: 0.8 mg/dL (ref 0.7–1.3)
EGFR: >90 mL/min/{1.73_m2} (ref >90)
GLUCOSE: 99 mg/dL (ref 70–140)
POTASSIUM: 4.2 meq/L (ref 3.5–5.1)
SODIUM: 140 meq/L (ref 136–145)
Total Bilirubin: 0.42 mg/dL (ref 0.20–1.20)
Total Protein: 6.8 g/dL (ref 6.4–8.3)

## 2015-08-08 LAB — CBC WITH DIFFERENTIAL/PLATELET
BASO%: 1.7 % (ref 0.0–2.0)
Basophils Absolute: 0.1 10*3/uL (ref 0.0–0.1)
EOS%: 1.3 % (ref 0.0–7.0)
Eosinophils Absolute: 0.1 10*3/uL (ref 0.0–0.5)
HCT: 31.1 % — ABNORMAL LOW (ref 38.4–49.9)
HEMOGLOBIN: 10 g/dL — AB (ref 13.0–17.1)
LYMPH%: 13 % — ABNORMAL LOW (ref 14.0–49.0)
MCH: 27.7 pg (ref 27.2–33.4)
MCHC: 32.2 g/dL (ref 32.0–36.0)
MCV: 86.1 fL (ref 79.3–98.0)
MONO#: 0.7 10*3/uL (ref 0.1–0.9)
MONO%: 13.6 % (ref 0.0–14.0)
NEUT#: 3.4 10*3/uL (ref 1.5–6.5)
NEUT%: 70.4 % (ref 39.0–75.0)
Platelets: 222 10*3/uL (ref 140–400)
RBC: 3.61 10*6/uL — ABNORMAL LOW (ref 4.20–5.82)
RDW: 16.5 % — AB (ref 11.0–14.6)
WBC: 4.8 10*3/uL (ref 4.0–10.3)
lymph#: 0.6 10*3/uL — ABNORMAL LOW (ref 0.9–3.3)

## 2015-08-08 MED ORDER — SODIUM CHLORIDE 0.9 % IV SOLN
Freq: Once | INTRAVENOUS | Status: AC
  Administered 2015-08-08: 10:00:00 via INTRAVENOUS

## 2015-08-08 MED ORDER — HEPARIN SOD (PORK) LOCK FLUSH 100 UNIT/ML IV SOLN
500.0000 [IU] | Freq: Once | INTRAVENOUS | Status: AC | PRN
  Administered 2015-08-08: 500 [IU]
  Filled 2015-08-08: qty 5

## 2015-08-08 MED ORDER — SODIUM CHLORIDE 0.9 % IV SOLN
Freq: Once | INTRAVENOUS | Status: AC
  Administered 2015-08-08: 10:00:00 via INTRAVENOUS
  Filled 2015-08-08: qty 4

## 2015-08-08 MED ORDER — DOCETAXEL CHEMO INJECTION 160 MG/16ML
75.0000 mg/m2 | Freq: Once | INTRAVENOUS | Status: AC
  Administered 2015-08-08: 120 mg via INTRAVENOUS
  Filled 2015-08-08: qty 12

## 2015-08-08 MED ORDER — SODIUM CHLORIDE 0.9 % IJ SOLN
10.0000 mL | INTRAMUSCULAR | Status: DC | PRN
  Administered 2015-08-08: 10 mL
  Filled 2015-08-08: qty 10

## 2015-08-08 NOTE — Progress Notes (Signed)
Hematology and Oncology Follow Up Visit  [name] Johnston [id number] [birth year] 64 y.o. 08/08/2015 8:55 AM [name],[name], MDDewey, [name], MD   Principle Diagnosis: 65 year old gentleman with prostate cancer diagnosed in 2014. He presented with obstructive symptoms and found to have a Gleason score 4+4 = 8 prostate cancer with PSA of around 40.  Liver mass diagnosed in December 2015 with an elevated AFP of 9.3. Biopsy proven to be prostate cancer.  Prior Therapy:  He is started [place] on 06/27/2013. He subsequently received definitive radiation therapy for a planned dose of 75 gray completed in November 2014 under the care of Dr. Tammi Klippel.  His PSA subsequently noted close to 0.33. Most recently his PSA was up to 11 on 10/29/2014 with a castrate levels of testosterone of 19. Staging workup did not reveal any bulky metastasis.  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  Current therapy: Docetaxel 75 mg/m given every 3 weeks with Neulasta support. Cycle one given on 05/16/2015. He is here for cycle 5.  Interim History:  [name] presents today for a follow-up visit with his daughter and an interpreter. Since his last visit, he reports no complications at this time. He continues on systemic chemotherapy with docetaxel with Neulasta support without any new complaints. He has reported grade 2 fatigue and grade 1 nausea without vomiting. He does not report any peripheral neuropathy or infusion related complications. He complains of some dysuria and recently saw some blood in his urine. He continues to have that periodically. Denies fever or chills. He does not report any pelvic pain or flank pain. He still has difficulty sleeping for few days after chemotherapy. He is able to eat and keep food down. His appetite is reasonable and continues to maintain weight.. He has not had any major changes in his performance status.   He does not report any  headaches or blurry vision or syncope. He does not report any fevers, chills, or sweats. His appetite is reasonable without any reflux but continues to report occasional bloating. He does not report any frequency urgency but does report dysuria and occasional hematuria. He does not report any skeletal complaints of back pain shoulder pain or hip pain. He does not report any lymphadenopathy or petechiae. Remainder of his review of systems unremarkable.   Medications: I have reviewed the patient's current medications.  Current Outpatient Prescriptions  Medication Sig Dispense Refill  . acetaminophen (TYLENOL) 325 MG tablet Take 650 mg by mouth every 6 (six) hours as needed.    . bicalutamide (CASODEX) 50 MG tablet Take 1 tablet (50 mg total) by mouth daily. 90 tablet 1  . HYDROcodone-acetaminophen (NORCO/VICODIN) 5-325 MG per tablet Take 1-2 tablets by mouth every 6 (six) hours as needed for moderate pain. 20 tablet 0  . lidocaine-prilocaine (EMLA) cream Apply 1 application topically as needed. 30 g 0  . omeprazole (PRILOSEC) 20 MG capsule Take 20 mg by mouth daily.    . polyethylene glycol (MIRALAX) packet Take 17 g by mouth daily. 28 each 3  . prochlorperazine (COMPAZINE) 10 MG tablet Take 1 tablet (10 mg total) by mouth every 6 (six) hours as needed for nausea or vomiting. 30 tablet 0  . solifenacin (VESICARE) 10 MG tablet Take 1 tablet (10 mg total) by mouth daily. 30 tablet 6   No current facility-administered medications for this visit.     Allergies:  Allergies  Allergen Reactions  . Aspirin Hives    Past Medical History,  Surgical history, Social history, and Family History were reviewed and updated.    Physical Exam: Blood pressure 117/54, pulse 86, temperature 98.4 F (36.9 C), temperature source Oral, resp. rate 18, height 5\' 1"  (1.549 m), weight 133 lb 1.6 oz (60.374 kg), SpO2 100 %. ECOG: 1 General appearance: alert and cooperative. Chronically ill-appearing. Appeared in no  active distress today. Head: Normocephalic, without obvious abnormality Neck: no adenopathy Lymph nodes: Cervical, supraclavicular, and axillary nodes normal. Heart:regular rate and rhythm, S1, S2 normal, no murmur, click, rub or gallop Lung:chest clear, no wheezing, rales, normal symmetric air entry Abdomin: soft, non-tender, without masses or organomegaly.  Good bowel sounds no shifting dullness. EXT:no erythema, induration, or nodules Skin: dry skin noted without any erythema or induration.   Lab Results: Lab Results  Component Value Date   WBC 4.8 08/08/2015   HGB 10.0* 08/08/2015   HCT 31.1* 08/08/2015   MCV 86.1 08/08/2015   PLT 222 08/08/2015     Chemistry      Component Value Date/Time   NA 140 07/18/2015 1000   NA 138 04/15/2015 2219   K 4.2 07/18/2015 1000   K 4.5 04/15/2015 2219   CL 102 04/15/2015 2219   CO2 27 07/18/2015 1000   CO2 25 04/15/2015 2219   BUN 9.9 07/18/2015 1000   BUN 13 04/15/2015 2219   CREATININE 0.8 07/18/2015 1000   CREATININE 0.95 04/15/2015 2219      Component Value Date/Time   CALCIUM 9.2 07/18/2015 1000   CALCIUM 9.5 04/15/2015 2219   ALKPHOS 162* 07/18/2015 1000   ALKPHOS 92 04/15/2015 2219   AST 25 07/18/2015 1000   AST 24 04/15/2015 2219   ALT 29 07/18/2015 1000   ALT 21 04/15/2015 2219   BILITOT 0.72 07/18/2015 1000   BILITOT 0.8 04/15/2015 2219       Results for [name] Johnston, [name] Johnston (MRN [id number]) as of 08/08/2015 08:40  Ref. Range 05/16/2015 09:59 06/06/2015 09:16 06/27/2015 11:02 07/18/2015 10:00  PSA Latest Ref Range: <=4.00 ng/mL 222.30 (H) 146.80 (H) 110.60 (H) 14.89 (H)       Impression and Plan:  64 year old gentleman with the following issues:  1. Prostate cancer diagnosed in June 2014. He presented with obstructive symptoms and found to have a Gleason score 4+4 = 8 prostate cancer with PSA of around 40. He was treated with androgen deprivation and radiation therapy with an excellent initial response and a PSA nadir of  0.33. PSA was up to 19 and Casodex was added.  He developed progression of disease with PSA up to 222 with liver metastasis.  He is currently on Taxotere chemotherapy and received 4 cycles. He has tolerated chemotherapy with few complications that are manageable. His PSA has dropped down dramatically from 222 down to 14 on 07/18/2015. The plan is to proceed with cycle 5 without any dose reduction or delay. The plan is to get him to 10 cycles of possible. We can consider dose reductions if he develops any other complications.  The rationale of continuing chemotherapy versus discontinuation were discussed again today. He understands that without systemic chemotherapy he will develop progressive cancer that will be quite debilitating rather quickly. He is more agreeable to continue with chemotherapy.  2. Liver masses: biopsy-proven to be prostate cancer without any evidence of hepatocellular carcinoma. We will repeat imaging studies in the near future to document response.  3.  IV access: Right anterior chest Port-A-Cath placed 05/07/2015. No complications noted.  4. Weight loss: I continue to encourage  him to use nutritional supplements to boost his intake. He is doing a better job at this time and maintain his weight.  5. Antiemetics: Prescription for Compazine was given on previous visits.  6. Neutropenia prophylaxis: Neulasta will be added to his chemotherapy regimen.   7. Dysuria: We will obtain a urine analysis and culture and sensitivity as well. He does have a urinary tract infection we will treat accordingly.  8. Follow-up: Will be in 3 weeks prior to the next cycle of chemotherapy     SHADAD,FIRAS, MD 8/18/20168:55 AM

## 2015-08-08 NOTE — Patient Instructions (Signed)
[facility] Discharge Instructions for Patients Receiving Chemotherapy  Today you received the following chemotherapy agents Taxotere.  To help prevent nausea and vomiting after your treatment, we encourage you to take your nausea medication as prescribed.   If you develop nausea and vomiting that is not controlled by your nausea medication, call the clinic.   BELOW ARE SYMPTOMS THAT SHOULD BE REPORTED IMMEDIATELY:  *FEVER GREATER THAN 100.5 F  *CHILLS WITH OR WITHOUT FEVER  NAUSEA AND VOMITING THAT IS NOT CONTROLLED WITH YOUR NAUSEA MEDICATION  *UNUSUAL SHORTNESS OF BREATH  *UNUSUAL BRUISING OR BLEEDING  TENDERNESS IN MOUTH AND THROAT WITH OR WITHOUT PRESENCE OF ULCERS  *URINARY PROBLEMS  *BOWEL PROBLEMS  UNUSUAL RASH Items with * indicate a potential emergency and should be followed up as soon as possible.  Feel free to call the clinic you have any questions or concerns. The clinic phone number is [phone].  Please show the CHEMO ALERT CARD at check-in to the Emergency Department and triage nurse.   

## 2015-08-08 NOTE — Telephone Encounter (Signed)
Gave and printed appt sched and avs for pt for Aug and SEpt

## 2015-08-09 ENCOUNTER — Ambulatory Visit (HOSPITAL_BASED_OUTPATIENT_CLINIC_OR_DEPARTMENT_OTHER): Payer: PPO

## 2015-08-09 VITALS — BP 116/66 | HR 78 | Temp 98.5°F

## 2015-08-09 DIAGNOSIS — Z5189 Encounter for other specified aftercare: Secondary | ICD-10-CM

## 2015-08-09 DIAGNOSIS — C61 Malignant neoplasm of prostate: Secondary | ICD-10-CM | POA: Diagnosis not present

## 2015-08-09 DIAGNOSIS — C787 Secondary malignant neoplasm of liver and intrahepatic bile duct: Secondary | ICD-10-CM | POA: Diagnosis not present

## 2015-08-09 LAB — PSA: PSA: 5.12 ng/mL — ABNORMAL HIGH (ref <=4.00)

## 2015-08-09 MED ORDER — PEGFILGRASTIM INJECTION 6 MG/0.6ML ~~LOC~~
6.0000 mg | PREFILLED_SYRINGE | Freq: Once | SUBCUTANEOUS | Status: AC
  Administered 2015-08-09: 6 mg via SUBCUTANEOUS
  Filled 2015-08-09: qty 0.6

## 2015-08-29 ENCOUNTER — Ambulatory Visit: Payer: PPO

## 2015-08-29 ENCOUNTER — Telehealth: Payer: Self-pay | Admitting: Oncology

## 2015-08-29 ENCOUNTER — Ambulatory Visit (HOSPITAL_BASED_OUTPATIENT_CLINIC_OR_DEPARTMENT_OTHER): Payer: PPO | Admitting: Oncology

## 2015-08-29 ENCOUNTER — Other Ambulatory Visit (HOSPITAL_BASED_OUTPATIENT_CLINIC_OR_DEPARTMENT_OTHER): Payer: PPO

## 2015-08-29 ENCOUNTER — Ambulatory Visit (HOSPITAL_BASED_OUTPATIENT_CLINIC_OR_DEPARTMENT_OTHER): Payer: PPO

## 2015-08-29 VITALS — BP 122/59 | HR 92 | Temp 98.4°F | Resp 18 | Ht 61.0 in | Wt 137.6 lb

## 2015-08-29 DIAGNOSIS — C787 Secondary malignant neoplasm of liver and intrahepatic bile duct: Secondary | ICD-10-CM | POA: Diagnosis not present

## 2015-08-29 DIAGNOSIS — C61 Malignant neoplasm of prostate: Secondary | ICD-10-CM

## 2015-08-29 DIAGNOSIS — R600 Localized edema: Secondary | ICD-10-CM

## 2015-08-29 DIAGNOSIS — R300 Dysuria: Secondary | ICD-10-CM

## 2015-08-29 DIAGNOSIS — Z95828 Presence of other vascular implants and grafts: Secondary | ICD-10-CM

## 2015-08-29 DIAGNOSIS — Z5111 Encounter for antineoplastic chemotherapy: Secondary | ICD-10-CM | POA: Diagnosis not present

## 2015-08-29 LAB — CBC WITH DIFFERENTIAL/PLATELET
BASO%: 1.1 % (ref 0.0–2.0)
BASOS ABS: 0.1 10*3/uL (ref 0.0–0.1)
EOS ABS: 0 10*3/uL (ref 0.0–0.5)
EOS%: 0.6 % (ref 0.0–7.0)
HCT: 28.2 % — ABNORMAL LOW (ref 38.4–49.9)
HGB: 9.3 g/dL — ABNORMAL LOW (ref 13.0–17.1)
LYMPH%: 10.7 % — AB (ref 14.0–49.0)
MCH: 28.1 pg (ref 27.2–33.4)
MCHC: 32.9 g/dL (ref 32.0–36.0)
MCV: 85.5 fL (ref 79.3–98.0)
MONO#: 0.8 10*3/uL (ref 0.1–0.9)
MONO%: 15.1 % — AB (ref 0.0–14.0)
NEUT#: 3.8 10*3/uL (ref 1.5–6.5)
NEUT%: 72.5 % (ref 39.0–75.0)
Platelets: 220 10*3/uL (ref 140–400)
RBC: 3.3 10*6/uL — AB (ref 4.20–5.82)
RDW: 18.2 % — ABNORMAL HIGH (ref 11.0–14.6)
WBC: 5.2 10*3/uL (ref 4.0–10.3)
lymph#: 0.6 10*3/uL — ABNORMAL LOW (ref 0.9–3.3)

## 2015-08-29 LAB — COMPREHENSIVE METABOLIC PANEL (CC13)
ALT: 15 U/L (ref 0–55)
AST: 18 U/L (ref 5–34)
Albumin: 3.4 g/dL — ABNORMAL LOW (ref 3.5–5.0)
Alkaline Phosphatase: 105 U/L (ref 40–150)
Anion Gap: 8 mEq/L (ref 3–11)
BUN: 13.8 mg/dL (ref 7.0–26.0)
CHLORIDE: 107 meq/L (ref 98–109)
CO2: 25 meq/L (ref 22–29)
Calcium: 8.6 mg/dL (ref 8.4–10.4)
Creatinine: 0.8 mg/dL (ref 0.7–1.3)
GLUCOSE: 116 mg/dL (ref 70–140)
POTASSIUM: 3.6 meq/L (ref 3.5–5.1)
SODIUM: 140 meq/L (ref 136–145)
Total Bilirubin: 0.62 mg/dL (ref 0.20–1.20)
Total Protein: 6.3 g/dL — ABNORMAL LOW (ref 6.4–8.3)

## 2015-08-29 MED ORDER — HEPARIN SOD (PORK) LOCK FLUSH 100 UNIT/ML IV SOLN
500.0000 [IU] | Freq: Once | INTRAVENOUS | Status: AC | PRN
  Administered 2015-08-29: 500 [IU]
  Filled 2015-08-29: qty 5

## 2015-08-29 MED ORDER — SODIUM CHLORIDE 0.9 % IV SOLN
Freq: Once | INTRAVENOUS | Status: AC
  Administered 2015-08-29: 14:00:00 via INTRAVENOUS

## 2015-08-29 MED ORDER — DOCETAXEL CHEMO INJECTION 160 MG/16ML
75.0000 mg/m2 | Freq: Once | INTRAVENOUS | Status: AC
  Administered 2015-08-29: 120 mg via INTRAVENOUS
  Filled 2015-08-29: qty 12

## 2015-08-29 MED ORDER — SODIUM CHLORIDE 0.9 % IV SOLN
Freq: Once | INTRAVENOUS | Status: AC
  Administered 2015-08-29: 14:00:00 via INTRAVENOUS
  Filled 2015-08-29: qty 4

## 2015-08-29 MED ORDER — SODIUM CHLORIDE 0.9 % IJ SOLN
10.0000 mL | INTRAMUSCULAR | Status: DC | PRN
  Administered 2015-08-29: 10 mL via INTRAVENOUS
  Filled 2015-08-29: qty 10

## 2015-08-29 MED ORDER — SODIUM CHLORIDE 0.9 % IJ SOLN
10.0000 mL | INTRAMUSCULAR | Status: DC | PRN
  Administered 2015-08-29: 10 mL
  Filled 2015-08-29: qty 10

## 2015-08-29 NOTE — Patient Instructions (Signed)

## 2015-08-29 NOTE — Telephone Encounter (Signed)
Gave patient avs report and appointments for September and October  °

## 2015-08-29 NOTE — Patient Instructions (Signed)
[facility] Discharge Instructions for Patients Receiving Chemotherapy  Today you received the following chemotherapy agents Taxotere.  To help prevent nausea and vomiting after your treatment, we encourage you to take your nausea medication as prescribed.   If you develop nausea and vomiting that is not controlled by your nausea medication, call the clinic.   BELOW ARE SYMPTOMS THAT SHOULD BE REPORTED IMMEDIATELY:  *FEVER GREATER THAN 100.5 F  *CHILLS WITH OR WITHOUT FEVER  NAUSEA AND VOMITING THAT IS NOT CONTROLLED WITH YOUR NAUSEA MEDICATION  *UNUSUAL SHORTNESS OF BREATH  *UNUSUAL BRUISING OR BLEEDING  TENDERNESS IN MOUTH AND THROAT WITH OR WITHOUT PRESENCE OF ULCERS  *URINARY PROBLEMS  *BOWEL PROBLEMS  UNUSUAL RASH Items with * indicate a potential emergency and should be followed up as soon as possible.  Feel free to call the clinic you have any questions or concerns. The clinic phone number is [phone].  Please show the CHEMO ALERT CARD at check-in to the Emergency Department and triage nurse.   

## 2015-08-29 NOTE — Progress Notes (Signed)
Hematology and Oncology Follow Up Visit  [name] Johnston [id number] [birth year] 64 y.o. 08/29/2015 1:42 PM [name] Johnston, MDDewey, [name], MD   Principle Diagnosis: 64 year old gentleman with prostate cancer diagnosed in 2014. He presented with obstructive symptoms and found to have a Gleason score 4+4 = 8 prostate cancer with PSA of around 40.  Liver mass diagnosed in December 2015 with an elevated AFP of 9.3. Biopsy proven to be prostate cancer.  Prior Therapy:  He is started [place] on 06/27/2013. He subsequently received definitive radiation therapy for a planned dose of 75 gray completed in November 2014 under the care of Dr. Tammi Johnston.  His PSA subsequently noted close to 0.33. Most recently his PSA was up to 11 on 10/29/2014 with a castrate levels of testosterone of 19. Staging workup did not reveal any bulky metastasis.  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  Current therapy: Docetaxel 75 mg/m given every 3 weeks with Neulasta support. Cycle one given on 05/16/2015. He is here for cycle 6.  Interim History:  [name] Johnston presents today for a follow-up visit with an interpreter. Since his last visit, he reports slight increase in his lower extremity edema. The swelling is predominantly up to the ankle bilaterally. Not associated with any shortness of breath or dyspnea on exertion. He continues tolerate systemic chemotherapy with docetaxel with Neulasta support without any othercomplaints. He has reported grade 2 fatigue and grade 1 nausea without vomiting. He does not report any peripheral neuropathy or infusion related complications.    Denies fever or chills. He does not report any pelvic pain or flank pain. He still has difficulty sleeping for few days after chemotherapy. He is able to eat and keep food down. His appetite is reasonable and continues to maintain weight.. He has not had any major changes in his performance status.    He does not report any headaches or blurry vision or syncope. He does not report any fevers, chills, or sweats. His appetite is reasonable without any reflux but continues to report occasional bloating. He does not report any frequency urgency but does report dysuria and occasional hematuria. He does not report any skeletal complaints of back pain shoulder pain or hip pain. He does not report any lymphadenopathy or petechiae. Remainder of his review of systems unremarkable.   Medications: I have reviewed the patient's current medications.  Current Outpatient Prescriptions  Medication Sig Dispense Refill  . acetaminophen (TYLENOL) 325 MG tablet Take 650 mg by mouth every 6 (six) hours as needed.    . bicalutamide (CASODEX) 50 MG tablet Take 1 tablet (50 mg total) by mouth daily. 90 tablet 1  . HYDROcodone-acetaminophen (NORCO/VICODIN) 5-325 MG per tablet Take 1-2 tablets by mouth every 6 (six) hours as needed for moderate pain. 20 tablet 0  . lidocaine-prilocaine (EMLA) cream Apply 1 application topically as needed. 30 g 0  . omeprazole (PRILOSEC) 20 MG capsule Take 20 mg by mouth daily.    . polyethylene glycol (MIRALAX) packet Take 17 g by mouth daily. 28 each 3  . prochlorperazine (COMPAZINE) 10 MG tablet Take 1 tablet (10 mg total) by mouth every 6 (six) hours as needed for nausea or vomiting. 30 tablet 0  . solifenacin (VESICARE) 10 MG tablet Take 1 tablet (10 mg total) by mouth daily. 30 tablet 6   No current facility-administered medications for this visit.     Allergies:  Allergies  Allergen Reactions  . Aspirin Hives    Past  Medical History, Surgical history, Social history, and Family History were reviewed and updated.    Physical Exam: Blood pressure 122/59, pulse 92, temperature 98.4 F (36.9 C), temperature source Oral, resp. rate 18, height 5\' 1"  (1.549 m), weight 137 lb 9.6 oz (62.415 kg), SpO2 100 %. ECOG: 1 General appearance: alert and cooperative. Chronically  ill-appearing. Appeared in no active distress today. Head: Normocephalic, without obvious abnormality Neck: no adenopathy Lymph nodes: Cervical, supraclavicular, and axillary nodes normal. Heart:regular rate and rhythm, S1, S2 normal, no murmur, click, rub or gallop Lung:chest clear, no wheezing, rales, normal symmetric air entry Abdomin: soft, non-tender, without masses or organomegaly.  Good bowel sounds no shifting dullness. EXT:no erythema, induration, or nodules. 1+ edema at the level ankle. Skin: dry skin noted without any erythema or induration.   Lab Results: Lab Results  Component Value Date   WBC 5.2 08/29/2015   HGB 9.3* 08/29/2015   HCT 28.2* 08/29/2015   MCV 85.5 08/29/2015   PLT 220 08/29/2015     Chemistry      Component Value Date/Time   NA 140 08/29/2015 1250   NA 138 04/15/2015 2219   K 3.6 08/29/2015 1250   K 4.5 04/15/2015 2219   CL 102 04/15/2015 2219   CO2 25 08/29/2015 1250   CO2 25 04/15/2015 2219   BUN 13.8 08/29/2015 1250   BUN 13 04/15/2015 2219   CREATININE 0.8 08/29/2015 1250   CREATININE 0.95 04/15/2015 2219      Component Value Date/Time   CALCIUM 8.6 08/29/2015 1250   CALCIUM 9.5 04/15/2015 2219   ALKPHOS 105 08/29/2015 1250   ALKPHOS 92 04/15/2015 2219   AST 18 08/29/2015 1250   AST 24 04/15/2015 2219   ALT 15 08/29/2015 1250   ALT 21 04/15/2015 2219   BILITOT 0.62 08/29/2015 1250   BILITOT 0.8 04/15/2015 2219        Results for [name] Johnston (MRN [id number]) as of 08/29/2015 13:29  Ref. Range 05/16/2015 09:59 06/06/2015 09:16 06/27/2015 11:02 07/18/2015 10:00 08/08/2015 08:23  PSA Latest Ref Range: <=4.00 ng/mL 222.30 (H) 146.80 (H) 110.60 (H) 14.89 (H) 5.12 (H)       Impression and Plan:  64 year old gentleman with the following issues:  1. Prostate cancer diagnosed in June 2014. He presented with obstructive symptoms and found to have a Gleason score 4+4 = 8 prostate cancer with PSA of around 40. He was treated with androgen  deprivation and radiation therapy with an excellent initial response and a PSA nadir of 0.33. PSA was up to 19 and Casodex was added.  He developed progression of disease with PSA up to 222 with liver metastasis.  He is currently on Taxotere chemotherapy and received 5 cycles. He has tolerated chemotherapy with few complications that are manageable. His PSA has dropped down dramatically from 222 down to 5 on 08/08/2015.  The plan is to proceed with cycle 6 without any dose reduction or delay. The plan is to get him to 10 cycles of possible. We can consider dose reductions if he develops any other complications. I will repeat imaging studies at that time.  The rationale of continuing chemotherapy versus discontinuation were discussed  today. He understands that without systemic chemotherapy he will develop progressive cancer that will be quite debilitating rather quickly. He is more agreeable to continue with chemotherapy.  2. Liver masses: biopsy-proven to be prostate cancer without any evidence of hepatocellular carcinoma. We will repeat imaging studies in the near future to document response.  3.  IV access: Right anterior chest Port-A-Cath placed 05/07/2015. No complications noted.  4. Weight loss: I continue to encourage him to use nutritional supplements to boost his intake. He is doing a better job at this time and maintain his weight.  5. Antiemetics: Prescription for Compazine was given on previous visits.  6. Neutropenia prophylaxis: Neulasta will be added to his chemotherapy regimen.   7. Lower extremity edema: Likely related to Taxotere. I encouraged him to elevate his legs and nighttime and we will l hold off on Lasix for now.  8. Follow-up: Will be in 3 weeks prior to the next cycle of chemotherapy     Livy Ross, MD 9/8/20161:42 PM

## 2015-08-30 ENCOUNTER — Ambulatory Visit: Payer: PPO

## 2015-08-30 LAB — PSA: PSA: 2.57 ng/mL (ref <=4.00)

## 2015-08-31 ENCOUNTER — Ambulatory Visit (HOSPITAL_BASED_OUTPATIENT_CLINIC_OR_DEPARTMENT_OTHER): Payer: PPO

## 2015-08-31 VITALS — BP 116/77 | HR 87 | Temp 98.0°F | Resp 18

## 2015-08-31 DIAGNOSIS — Z5189 Encounter for other specified aftercare: Secondary | ICD-10-CM | POA: Diagnosis not present

## 2015-08-31 DIAGNOSIS — C61 Malignant neoplasm of prostate: Secondary | ICD-10-CM

## 2015-08-31 DIAGNOSIS — C787 Secondary malignant neoplasm of liver and intrahepatic bile duct: Secondary | ICD-10-CM

## 2015-08-31 MED ORDER — PEGFILGRASTIM INJECTION 6 MG/0.6ML ~~LOC~~
6.0000 mg | PREFILLED_SYRINGE | Freq: Once | SUBCUTANEOUS | Status: AC
  Administered 2015-08-31: 6 mg via SUBCUTANEOUS

## 2015-09-19 ENCOUNTER — Other Ambulatory Visit (HOSPITAL_BASED_OUTPATIENT_CLINIC_OR_DEPARTMENT_OTHER): Payer: PPO

## 2015-09-19 ENCOUNTER — Telehealth: Payer: Self-pay | Admitting: Oncology

## 2015-09-19 ENCOUNTER — Ambulatory Visit (HOSPITAL_BASED_OUTPATIENT_CLINIC_OR_DEPARTMENT_OTHER): Payer: PPO | Admitting: Oncology

## 2015-09-19 ENCOUNTER — Ambulatory Visit (HOSPITAL_BASED_OUTPATIENT_CLINIC_OR_DEPARTMENT_OTHER): Payer: PPO

## 2015-09-19 VITALS — BP 116/47 | HR 76 | Temp 98.6°F | Resp 18 | Ht 61.0 in | Wt 141.1 lb

## 2015-09-19 DIAGNOSIS — R600 Localized edema: Secondary | ICD-10-CM

## 2015-09-19 DIAGNOSIS — C787 Secondary malignant neoplasm of liver and intrahepatic bile duct: Secondary | ICD-10-CM

## 2015-09-19 DIAGNOSIS — Z5111 Encounter for antineoplastic chemotherapy: Secondary | ICD-10-CM | POA: Diagnosis not present

## 2015-09-19 DIAGNOSIS — R300 Dysuria: Secondary | ICD-10-CM

## 2015-09-19 DIAGNOSIS — C61 Malignant neoplasm of prostate: Secondary | ICD-10-CM

## 2015-09-19 DIAGNOSIS — R634 Abnormal weight loss: Secondary | ICD-10-CM | POA: Diagnosis not present

## 2015-09-19 LAB — CBC WITH DIFFERENTIAL/PLATELET
BASO%: 2.3 % — ABNORMAL HIGH (ref 0.0–2.0)
BASOS ABS: 0.1 10*3/uL (ref 0.0–0.1)
EOS ABS: 0 10*3/uL (ref 0.0–0.5)
EOS%: 0.4 % (ref 0.0–7.0)
HCT: 31 % — ABNORMAL LOW (ref 38.4–49.9)
HGB: 10.2 g/dL — ABNORMAL LOW (ref 13.0–17.1)
LYMPH%: 17.1 % (ref 14.0–49.0)
MCH: 28.1 pg (ref 27.2–33.4)
MCHC: 32.8 g/dL (ref 32.0–36.0)
MCV: 85.6 fL (ref 79.3–98.0)
MONO#: 0.5 10*3/uL (ref 0.1–0.9)
MONO%: 13.1 % (ref 0.0–14.0)
NEUT#: 2.6 10*3/uL (ref 1.5–6.5)
NEUT%: 67.1 % (ref 39.0–75.0)
PLATELETS: 228 10*3/uL (ref 140–400)
RBC: 3.62 10*6/uL — AB (ref 4.20–5.82)
RDW: 18.4 % — ABNORMAL HIGH (ref 11.0–14.6)
WBC: 3.9 10*3/uL — ABNORMAL LOW (ref 4.0–10.3)
lymph#: 0.7 10*3/uL — ABNORMAL LOW (ref 0.9–3.3)

## 2015-09-19 LAB — COMPREHENSIVE METABOLIC PANEL (CC13)
ALBUMIN: 3.4 g/dL — AB (ref 3.5–5.0)
ALK PHOS: 100 U/L (ref 40–150)
ALT: 13 U/L (ref 0–55)
ANION GAP: 6 meq/L (ref 3–11)
AST: 17 U/L (ref 5–34)
BUN: 11.9 mg/dL (ref 7.0–26.0)
CO2: 27 meq/L (ref 22–29)
Calcium: 8.8 mg/dL (ref 8.4–10.4)
Chloride: 108 mEq/L (ref 98–109)
Creatinine: 0.8 mg/dL (ref 0.7–1.3)
GLUCOSE: 95 mg/dL (ref 70–140)
POTASSIUM: 4.2 meq/L (ref 3.5–5.1)
SODIUM: 142 meq/L (ref 136–145)
Total Bilirubin: 0.39 mg/dL (ref 0.20–1.20)
Total Protein: 6.5 g/dL (ref 6.4–8.3)

## 2015-09-19 MED ORDER — HEPARIN SOD (PORK) LOCK FLUSH 100 UNIT/ML IV SOLN
500.0000 [IU] | Freq: Once | INTRAVENOUS | Status: AC | PRN
  Administered 2015-09-19: 500 [IU]
  Filled 2015-09-19: qty 5

## 2015-09-19 MED ORDER — SODIUM CHLORIDE 0.9 % IJ SOLN
10.0000 mL | INTRAMUSCULAR | Status: DC | PRN
  Administered 2015-09-19: 10 mL
  Filled 2015-09-19: qty 10

## 2015-09-19 MED ORDER — DOCETAXEL CHEMO INJECTION 160 MG/16ML
75.0000 mg/m2 | Freq: Once | INTRAVENOUS | Status: AC
  Administered 2015-09-19: 120 mg via INTRAVENOUS
  Filled 2015-09-19: qty 12

## 2015-09-19 MED ORDER — POTASSIUM CHLORIDE CRYS ER 20 MEQ PO TBCR: 20.0000 meq | EXTENDED_RELEASE_TABLET | Freq: Every day | ORAL | Status: DC

## 2015-09-19 MED ORDER — FUROSEMIDE 20 MG PO TABS: 20.0000 mg | ORAL_TABLET | Freq: Every day | ORAL | Status: DC

## 2015-09-19 MED ORDER — SODIUM CHLORIDE 0.9 % IV SOLN
Freq: Once | INTRAVENOUS | Status: AC
  Administered 2015-09-19: 13:00:00 via INTRAVENOUS

## 2015-09-19 MED ORDER — SODIUM CHLORIDE 0.9 % IV SOLN
Freq: Once | INTRAVENOUS | Status: AC
  Administered 2015-09-19: 14:00:00 via INTRAVENOUS
  Filled 2015-09-19: qty 4

## 2015-09-19 NOTE — Telephone Encounter (Signed)
Gave and printed appt sched and avs for pt for Sept thru NOV °

## 2015-09-19 NOTE — Patient Instructions (Signed)
[facility] Discharge Instructions for Patients Receiving Chemotherapy  Today you received the following chemotherapy agents taxotere To help prevent nausea and vomiting after your treatment, we encourage you to take your nausea medication as prescribed.   If you develop nausea and vomiting that is not controlled by your nausea medication, call the clinic.   BELOW ARE SYMPTOMS THAT SHOULD BE REPORTED IMMEDIATELY:  *FEVER GREATER THAN 100.5 F  *CHILLS WITH OR WITHOUT FEVER  NAUSEA AND VOMITING THAT IS NOT CONTROLLED WITH YOUR NAUSEA MEDICATION  *UNUSUAL SHORTNESS OF BREATH  *UNUSUAL BRUISING OR BLEEDING  TENDERNESS IN MOUTH AND THROAT WITH OR WITHOUT PRESENCE OF ULCERS  *URINARY PROBLEMS  *BOWEL PROBLEMS  UNUSUAL RASH Items with * indicate a potential emergency and should be followed up as soon as possible.  Feel free to call the clinic you have any questions or concerns. The clinic phone number is [phone].  Please show the CHEMO ALERT CARD at check-in to the Emergency Department and triage nurse.   

## 2015-09-19 NOTE — Progress Notes (Signed)
Hematology and Oncology Follow Up Visit  [name] Johnston [id number] 1951/02/13 64 y.o. 09/19/2015 1:02 PM Johnston,[name], MDDewey, [name], MD   Principle Diagnosis: 64 year old gentleman with prostate cancer diagnosed in 2014. He presented with obstructive symptoms and found to have a Gleason score 4+4 = 8 prostate cancer with PSA of around 40.  Liver mass diagnosed in December 2015 with an elevated AFP of 9.3. Biopsy proven to be prostate cancer.  Prior Therapy:  He is started [place] on 06/27/2013. He subsequently received definitive radiation therapy for a planned dose of 75 gray completed in November 2014 under the care of Dr. Tammi Klippel.  His PSA subsequently noted close to 0.33. Most recently his PSA was up to 11 on 10/29/2014 with a castrate levels of testosterone of 19. Staging workup did not reveal any bulky metastasis.  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  Current therapy: Docetaxel 75 mg/m given every 3 weeks with Neulasta support. Cycle one given on 05/16/2015. He is here for cycle 7.  Interim History:  [name] presents today for a follow-up visit with an interpreter. Since his last visit, he continues to have increase in his lower extremity edema. The swelling is has increased to the midshin. He does have increase in abdominal bloating associated with it. No orthopnea or dyspnea on exertion.  He continues tolerate systemic chemotherapy with docetaxel with Neulasta support without any othercomplaints. He has reported grade 1 fatigue and no nausea or vomiting. He does not report any peripheral neuropathy or infusion related complications.    His performance status and quality of life continue to be stable without any decline. His appetite is excellent and is able to maintain his weight.  He does not report any headaches or blurry vision or syncope. He does not report any fevers, chills, or sweats.  He does not report any  frequency urgency but does report dysuria and occasional hematuria. He does not report any skeletal complaints of back pain shoulder pain or hip pain. He does not report any lymphadenopathy or petechiae. Remainder of his review of systems unremarkable.   Medications: I have reviewed the patient's current medications.  Current Outpatient Prescriptions  Medication Sig Dispense Refill  . acetaminophen (TYLENOL) 325 MG tablet Take 650 mg by mouth every 6 (six) hours as needed.    . bicalutamide (CASODEX) 50 MG tablet Take 1 tablet (50 mg total) by mouth daily. 90 tablet 1  . furosemide (LASIX) 20 MG tablet Take 1 tablet (20 mg total) by mouth daily. 30 tablet 1  . HYDROcodone-acetaminophen (NORCO/VICODIN) 5-325 MG per tablet Take 1-2 tablets by mouth every 6 (six) hours as needed for moderate pain. 20 tablet 0  . lidocaine-prilocaine (EMLA) cream Apply 1 application topically as needed. 30 g 0  . omeprazole (PRILOSEC) 20 MG capsule Take 20 mg by mouth daily.    . polyethylene glycol (MIRALAX) packet Take 17 g by mouth daily. 28 each 3  . potassium chloride SA (K-DUR,KLOR-CON) 20 MEQ tablet Take 1 tablet (20 mEq total) by mouth daily. 30 tablet 1  . prochlorperazine (COMPAZINE) 10 MG tablet Take 1 tablet (10 mg total) by mouth every 6 (six) hours as needed for nausea or vomiting. 30 tablet 0  . solifenacin (VESICARE) 10 MG tablet Take 1 tablet (10 mg total) by mouth daily. 30 tablet 6   No current facility-administered medications for this visit.     Allergies:  Allergies  Allergen Reactions  . Aspirin Hives  Past Medical History, Surgical history, Social history, and Family History were reviewed and updated.    Physical Exam: Blood pressure 116/47, pulse 76, temperature 98.6 F (37 C), temperature source Oral, resp. rate 18, height 5\' 1"  (1.549 m), weight 141 lb 1.6 oz (64.003 kg), SpO2 100 %. ECOG: 1 General appearance: alert and cooperative.  Appeared well without distress. Head:  Normocephalic, without obvious abnormality. Oral mucosa showed no evidence of erythema or ulcers. Neck: no adenopathy Lymph nodes: Cervical, supraclavicular, and axillary nodes normal. Heart:regular rate and rhythm, S1, S2 normal, no murmur, click, rub or gallop  Chest wall examination: Showed a Port-A-Cath to be in good shape without abnormalities. Lung:chest clear, no wheezing, rales, normal symmetric air entry Abdomin: soft, non-tender, without masses or organomegaly. Slight distention noted. EXT:no erythema, induration, or nodules. 1+ edema  Bilaterally increased from previous examination. Skin: dry skin noted without any erythema or induration.   Lab Results: Lab Results  Component Value Date   WBC 3.9* 09/19/2015   HGB 10.2* 09/19/2015   HCT 31.0* 09/19/2015   MCV 85.6 09/19/2015   PLT 228 09/19/2015     Chemistry      Component Value Date/Time   NA 140 08/29/2015 1250   NA 138 04/15/2015 2219   K 3.6 08/29/2015 1250   K 4.5 04/15/2015 2219   CL 102 04/15/2015 2219   CO2 25 08/29/2015 1250   CO2 25 04/15/2015 2219   BUN 13.8 08/29/2015 1250   BUN 13 04/15/2015 2219   CREATININE 0.8 08/29/2015 1250   CREATININE 0.95 04/15/2015 2219      Component Value Date/Time   CALCIUM 8.6 08/29/2015 1250   CALCIUM 9.5 04/15/2015 2219   ALKPHOS 105 08/29/2015 1250   ALKPHOS 92 04/15/2015 2219   AST 18 08/29/2015 1250   AST 24 04/15/2015 2219   ALT 15 08/29/2015 1250   ALT 21 04/15/2015 2219   BILITOT 0.62 08/29/2015 1250   BILITOT 0.8 04/15/2015 2219      Results for [name] Johnston, [name] Johnston (MRN [id number]) as of 09/19/2015 12:47  Ref. Range 06/27/2015 11:02 07/18/2015 10:00 08/08/2015 08:23 08/29/2015 12:49  PSA Latest Ref Range: <=4.00 ng/mL 110.60 (H) 14.89 (H) 5.12 (H) 2.57          Impression and Plan:  64 year old gentleman with the following issues:  1. Prostate cancer diagnosed in June 2014. He presented with obstructive symptoms and found to have a Gleason score 4+4 = 8  prostate cancer with PSA of around 40. He was treated with androgen deprivation and radiation therapy with an excellent initial response and a PSA nadir of 0.33. PSA was up to 19 and Casodex was added.  He developed progression of disease with PSA up to 222 with liver metastasis.  He is currently on Taxotere chemotherapy and received 6 cycles.  He continues to have excellent tolerance and response to therapy. His PSA has dropped down to  2.57 with excellent clinical benefits.  The plan is to proceed with cycle 7 without any dose reduction or delay. The plan is to get him to 10 cycles of possible. We can consider dose reductions if he develops any other complications.  We will repeat imaging studies upon completion of chemotherapy. He is agreeable to continue at this time and understands the plan of care.    2. Liver masses: biopsy-proven to be prostate cancer without any evidence of hepatocellular carcinoma. We will repeat imaging studies in the near future to document response.  3.  IV access:  Right anterior chest Port-A-Cath placed 05/07/2015.  Has not reported any pain or drainage.  4. Weight loss:  We will repeat to be stable at this time. Continue to emphasize the importance of good nutrition  5. Antiemetics:  Compazine is available to the patient at this time.  6. Neutropenia prophylaxis: Neulasta will be added to his chemotherapy regimen.   7. Lower extremity edema: Likely related to Taxotere. Conservative measures did not help his symptoms and I offered him a diabetic in the form of Lasix. Risks and benefits of this medication was reviewed complications include frequency urination as well as electrolyte imbalance. He is agreeable to take it I will start a lower dose with potassium supplements and check his electrolytes with the next visit.  8. Follow-up: Will be in 3 weeks prior to the next cycle of chemotherapy     SHADAD,FIRAS, MD 9/29/20161:02 PM

## 2015-09-20 ENCOUNTER — Ambulatory Visit: Payer: PPO

## 2015-09-20 LAB — PSA: PSA: 1.05 ng/mL (ref <=4.00)

## 2015-09-21 ENCOUNTER — Ambulatory Visit (HOSPITAL_BASED_OUTPATIENT_CLINIC_OR_DEPARTMENT_OTHER): Payer: PPO

## 2015-09-21 VITALS — BP 120/61 | HR 85 | Temp 97.3°F | Resp 18

## 2015-09-21 DIAGNOSIS — Z5189 Encounter for other specified aftercare: Secondary | ICD-10-CM

## 2015-09-21 DIAGNOSIS — C61 Malignant neoplasm of prostate: Secondary | ICD-10-CM

## 2015-09-21 MED ORDER — PEGFILGRASTIM INJECTION 6 MG/0.6ML ~~LOC~~
6.0000 mg | PREFILLED_SYRINGE | Freq: Once | SUBCUTANEOUS | Status: AC
  Administered 2015-09-21: 6 mg via SUBCUTANEOUS

## 2015-09-21 NOTE — Patient Instructions (Signed)
Pegfilgrastim injection What is this medicine? PEGFILGRASTIM (peg fil GRA stim) is a long-acting granulocyte colony-stimulating factor that stimulates the growth of neutrophils, a type of white blood cell important in the body's fight against infection. It is used to reduce the incidence of fever and infection in patients with certain types of cancer who are receiving chemotherapy that affects the bone marrow. This medicine may be used for other purposes; ask your health care provider or pharmacist if you have questions. COMMON BRAND NAME(S): Neulasta What should I tell my health care provider before I take this medicine? They need to know if you have any of these conditions: -latex allergy -ongoing radiation therapy -sickle cell disease -skin reactions to acrylic adhesives (On-Body Injector only) -an unusual or allergic reaction to pegfilgrastim, filgrastim, other medicines, foods, dyes, or preservatives -pregnant or trying to get pregnant -breast-feeding How should I use this medicine? This medicine is for injection under the skin. If you get this medicine at home, you will be taught how to prepare and give the pre-filled syringe or how to use the On-body Injector. Refer to the patient Instructions for Use for detailed instructions. Use exactly as directed. Take your medicine at regular intervals. Do not take your medicine more often than directed. It is important that you put your used needles and syringes in a special sharps container. Do not put them in a trash can. If you do not have a sharps container, call your pharmacist or healthcare provider to get one. Talk to your pediatrician regarding the use of this medicine in children. Special care may be needed. Overdosage: If you think you have taken too much of this medicine contact a poison control center or emergency room at once. NOTE: This medicine is only for you. Do not share this medicine with others. What if I miss a dose? It is  important not to miss your dose. Call your doctor or health care professional if you miss your dose. If you miss a dose due to an On-body Injector failure or leakage, a new dose should be administered as soon as possible using a single prefilled syringe for manual use. What may interact with this medicine? Interactions have not been studied. Give your health care provider a list of all the medicines, herbs, non-prescription drugs, or dietary supplements you use. Also tell them if you smoke, drink alcohol, or use illegal drugs. Some items may interact with your medicine. This list may not describe all possible interactions. Give your health care provider a list of all the medicines, herbs, non-prescription drugs, or dietary supplements you use. Also tell them if you smoke, drink alcohol, or use illegal drugs. Some items may interact with your medicine. What should I watch for while using this medicine? You may need blood work done while you are taking this medicine. If you are going to need a MRI, CT scan, or other procedure, tell your doctor that you are using this medicine (On-Body Injector only). What side effects may I notice from receiving this medicine? Side effects that you should report to your doctor or health care professional as soon as possible: -allergic reactions like skin rash, itching or hives, swelling of the face, lips, or tongue -dizziness -fever -pain, redness, or irritation at site where injected -pinpoint red spots on the skin -shortness of breath or breathing problems -stomach or side pain, or pain at the shoulder -swelling -tiredness -trouble passing urine Side effects that usually do not require medical attention (report to your doctor   or health care professional if they continue or are bothersome): -bone pain -muscle pain This list may not describe all possible side effects. Call your doctor for medical advice about side effects. You may report side effects to FDA at  1-800-FDA-1088. Where should I keep my medicine? Keep out of the reach of children. Store pre-filled syringes in a refrigerator between 2 and 8 degrees C (36 and 46 degrees F). Do not freeze. Keep in carton to protect from light. Throw away this medicine if it is left out of the refrigerator for more than 48 hours. Throw away any unused medicine after the expiration date. NOTE: This sheet is a summary. It may not cover all possible information. If you have questions about this medicine, talk to your doctor, pharmacist, or health care provider.  2015, [employer]vier/Gold Standard. (2014-03-08 16:14:05)  

## 2015-10-10 ENCOUNTER — Telehealth: Payer: Self-pay | Admitting: Oncology

## 2015-10-10 ENCOUNTER — Other Ambulatory Visit: Payer: Self-pay | Admitting: *Deleted

## 2015-10-10 ENCOUNTER — Ambulatory Visit (HOSPITAL_BASED_OUTPATIENT_CLINIC_OR_DEPARTMENT_OTHER): Payer: PPO

## 2015-10-10 ENCOUNTER — Other Ambulatory Visit (HOSPITAL_BASED_OUTPATIENT_CLINIC_OR_DEPARTMENT_OTHER): Payer: PPO

## 2015-10-10 ENCOUNTER — Ambulatory Visit (HOSPITAL_BASED_OUTPATIENT_CLINIC_OR_DEPARTMENT_OTHER): Payer: PPO | Admitting: Oncology

## 2015-10-10 VITALS — BP 125/55 | HR 84 | Temp 98.2°F | Resp 18 | Ht 61.0 in | Wt 140.8 lb

## 2015-10-10 DIAGNOSIS — C61 Malignant neoplasm of prostate: Secondary | ICD-10-CM

## 2015-10-10 DIAGNOSIS — R160 Hepatomegaly, not elsewhere classified: Secondary | ICD-10-CM

## 2015-10-10 DIAGNOSIS — Z5111 Encounter for antineoplastic chemotherapy: Secondary | ICD-10-CM

## 2015-10-10 DIAGNOSIS — R600 Localized edema: Secondary | ICD-10-CM | POA: Diagnosis not present

## 2015-10-10 DIAGNOSIS — C787 Secondary malignant neoplasm of liver and intrahepatic bile duct: Secondary | ICD-10-CM

## 2015-10-10 LAB — CBC WITH DIFFERENTIAL/PLATELET
BASO%: 1.3 % (ref 0.0–2.0)
Basophils Absolute: 0.1 10*3/uL (ref 0.0–0.1)
EOS%: 0.4 % (ref 0.0–7.0)
Eosinophils Absolute: 0 10*3/uL (ref 0.0–0.5)
HEMATOCRIT: 32.5 % — AB (ref 38.4–49.9)
HGB: 10.4 g/dL — ABNORMAL LOW (ref 13.0–17.1)
LYMPH#: 0.7 10*3/uL — AB (ref 0.9–3.3)
LYMPH%: 16.2 % (ref 14.0–49.0)
MCH: 27.6 pg (ref 27.2–33.4)
MCHC: 32 g/dL (ref 32.0–36.0)
MCV: 86.4 fL (ref 79.3–98.0)
MONO#: 0.5 10*3/uL (ref 0.1–0.9)
MONO%: 12.6 % (ref 0.0–14.0)
NEUT#: 2.9 10*3/uL (ref 1.5–6.5)
NEUT%: 69.5 % (ref 39.0–75.0)
Platelets: 196 10*3/uL (ref 140–400)
RBC: 3.76 10*6/uL — AB (ref 4.20–5.82)
RDW: 18.7 % — AB (ref 11.0–14.6)
WBC: 4.2 10*3/uL (ref 4.0–10.3)

## 2015-10-10 LAB — COMPREHENSIVE METABOLIC PANEL (CC13)
ALT: 15 U/L (ref 0–55)
AST: 19 U/L (ref 5–34)
Albumin: 3.3 g/dL — ABNORMAL LOW (ref 3.5–5.0)
Alkaline Phosphatase: 95 U/L (ref 40–150)
Anion Gap: 7 mEq/L (ref 3–11)
BUN: 8.6 mg/dL (ref 7.0–26.0)
CALCIUM: 8.8 mg/dL (ref 8.4–10.4)
CHLORIDE: 108 meq/L (ref 98–109)
CO2: 26 meq/L (ref 22–29)
CREATININE: 0.8 mg/dL (ref 0.7–1.3)
EGFR: >90 mL/min/{1.73_m2} (ref >90)
GLUCOSE: 104 mg/dL (ref 70–140)
Potassium: 4 mEq/L (ref 3.5–5.1)
SODIUM: 141 meq/L (ref 136–145)
Total Bilirubin: 0.37 mg/dL (ref 0.20–1.20)
Total Protein: 6.1 g/dL — ABNORMAL LOW (ref 6.4–8.3)

## 2015-10-10 MED ORDER — DOCETAXEL CHEMO INJECTION 160 MG/16ML
75.0000 mg/m2 | Freq: Once | INTRAVENOUS | Status: AC
  Administered 2015-10-10: 120 mg via INTRAVENOUS
  Filled 2015-10-10: qty 12

## 2015-10-10 MED ORDER — SPIRONOLACTONE 25 MG PO TABS: 25.0000 mg | ORAL_TABLET | Freq: Every day | ORAL | Status: DC

## 2015-10-10 MED ORDER — SODIUM CHLORIDE 0.9 % IV SOLN
Freq: Once | INTRAVENOUS | Status: AC
  Administered 2015-10-10: 10:00:00 via INTRAVENOUS
  Filled 2015-10-10: qty 4

## 2015-10-10 MED ORDER — HEPARIN SOD (PORK) LOCK FLUSH 100 UNIT/ML IV SOLN
500.0000 [IU] | Freq: Once | INTRAVENOUS | Status: AC | PRN
  Administered 2015-10-10: 500 [IU]
  Filled 2015-10-10: qty 5

## 2015-10-10 MED ORDER — POLYETHYLENE GLYCOL 3350 17 G PO PACK: 17.0000 g | PACK | Freq: Every day | ORAL | Status: DC

## 2015-10-10 MED ORDER — SODIUM CHLORIDE 0.9 % IV SOLN
Freq: Once | INTRAVENOUS | Status: AC
  Administered 2015-10-10: 10:00:00 via INTRAVENOUS

## 2015-10-10 MED ORDER — SODIUM CHLORIDE 0.9 % IJ SOLN
10.0000 mL | INTRAMUSCULAR | Status: DC | PRN
  Administered 2015-10-10: 10 mL
  Filled 2015-10-10: qty 10

## 2015-10-10 MED ORDER — POLYETHYLENE GLYCOL 3350 17 GM/SCOOP PO POWD: ORAL | Status: DC

## 2015-10-10 NOTE — Progress Notes (Signed)
Hematology and Oncology Follow Up Visit  [name] Johnston [id number] 1951-02-14 64 y.o. 10/10/2015 9:32 AM [name],Johnston, MDDewey, [name], MD   Principle Diagnosis: 64 year old gentleman with prostate cancer diagnosed in 2014. He presented with obstructive symptoms and found to have a Gleason score 4+4 = 8 prostate cancer with PSA of around 40.  Liver mass diagnosed in December 2015 with an elevated AFP of 9.3. Biopsy proven to be prostate cancer.  Prior Therapy:  He is started [place] on 06/27/2013. He subsequently received definitive radiation therapy for a planned dose of 75 gray completed in November 2014 under the care of Dr. Tammi Klippel.  His PSA subsequently noted close to 0.33. Most recently his PSA was up to 11 on 10/29/2014 with a castrate levels of testosterone of 19. Staging workup did not reveal any bulky metastasis.  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  Current therapy: Docetaxel 75 mg/m given every 3 weeks with Neulasta support. Cycle one given on 05/16/2015. He is here for cycle 8.  Interim History:  Mr. [name] presents today for a follow-up visit with his son and an interpreter. Since his last visit, he continues to have increase in his lower extremity edema despite being on Lasix. The swelling seems to have worsened but not associated with any other fluid retention. He has not reported any ascites or shortness of breath.   He does have increase in abdominal bloating. No orthopnea or dyspnea on exertion.  He continues tolerate systemic chemotherapy with docetaxel with Neulasta support without any othercomplaints. He has reported grade 1 fatigue and no nausea or vomiting. He does not report any peripheral neuropathy or infusion related complications. He continues to have recurrent issue with constipation that is resolved by MiraLAX.   His performance status and quality of life continue to be stable without any decline.  His appetite is excellent and is able to maintain his weight. He feels it chemotherapy of helped his overall quality of life.  He does not report any headaches or blurry vision or syncope. He does not report any fevers, chills, or sweats.  He does not report any frequency urgency but does report dysuria and occasional hematuria. He does not report any skeletal complaints of back pain shoulder pain or hip pain. He does not report any lymphadenopathy or petechiae. Remainder of his review of systems unremarkable.   Medications: I have reviewed the patient's current medications.  Current Outpatient Prescriptions  Medication Sig Dispense Refill  . acetaminophen (TYLENOL) 325 MG tablet Take 650 mg by mouth every 6 (six) hours as needed.    Marland Kitchen HYDROcodone-acetaminophen (NORCO/VICODIN) 5-325 MG per tablet Take 1-2 tablets by mouth every 6 (six) hours as needed for moderate pain. 20 tablet 0  . lidocaine-prilocaine (EMLA) cream Apply 1 application topically as needed. 30 g 0  . omeprazole (PRILOSEC) 20 MG capsule Take 20 mg by mouth daily.    . polyethylene glycol (MIRALAX) packet Take 17 g by mouth daily. 28 each 3  . prochlorperazine (COMPAZINE) 10 MG tablet Take 1 tablet (10 mg total) by mouth every 6 (six) hours as needed for nausea or vomiting. 30 tablet 0  . solifenacin (VESICARE) 10 MG tablet Take 1 tablet (10 mg total) by mouth daily. 30 tablet 6  . spironolactone (ALDACTONE) 25 MG tablet Take 1 tablet (25 mg total) by mouth daily. 30 tablet 1   No current facility-administered medications for this visit.     Allergies:  Allergies  Allergen  Reactions  . Aspirin Hives    Past Medical History, Surgical history, Social history, and Family History were reviewed and updated.    Physical Exam: Blood pressure 125/55, pulse 84, temperature 98.2 F (36.8 C), temperature source Oral, resp. rate 18, height 5\' 1"  (1.549 m), weight 140 lb 12.8 oz (63.866 kg), SpO2 100 %. ECOG: 1 General  appearance: alert and cooperative.  Well-appearing gentleman without distress. Head: Normocephalic, without obvious abnormality. No oral thrush or ulcers. Neck: no adenopathy Lymph nodes: Cervical, supraclavicular, and axillary nodes normal. Heart:regular rate and rhythm, S1, S2 normal, no murmur, click, rub or gallop  Chest wall examination: Showed a Port-A-Cath to be in good shape without abnormalities. No drainage or erythema. Lung:chest clear, no wheezing, rales, normal symmetric air entry Abdomin: soft, non-tender, without masses or organomegaly. No shifting dullness or ascites. EXT:2+ edema  Bilaterally increased from previous examination. Skin: dry skin noted without any erythema or induration.   Lab Results: Lab Results  Component Value Date   WBC 4.2 10/10/2015   HGB 10.4* 10/10/2015   HCT 32.5* 10/10/2015   MCV 86.4 10/10/2015   PLT 196 10/10/2015     Chemistry      Component Value Date/Time   NA 142 09/19/2015 1232   NA 138 04/15/2015 2219   K 4.2 09/19/2015 1232   K 4.5 04/15/2015 2219   CL 102 04/15/2015 2219   CO2 27 09/19/2015 1232   CO2 25 04/15/2015 2219   BUN 11.9 09/19/2015 1232   BUN 13 04/15/2015 2219   CREATININE 0.8 09/19/2015 1232   CREATININE 0.95 04/15/2015 2219      Component Value Date/Time   CALCIUM 8.8 09/19/2015 1232   CALCIUM 9.5 04/15/2015 2219   ALKPHOS 100 09/19/2015 1232   ALKPHOS 92 04/15/2015 2219   AST 17 09/19/2015 1232   AST 24 04/15/2015 2219   ALT 13 09/19/2015 1232   ALT 21 04/15/2015 2219   BILITOT 0.39 09/19/2015 1232   BILITOT 0.8 04/15/2015 2219          Results for ADAIR, LAUDERBACK (MRN [id number]) as of 10/10/2015 09:16  Ref. Range 04/10/2015 14:56 05/16/2015 09:59 06/06/2015 09:16 06/27/2015 11:02 07/18/2015 10:00 08/08/2015 08:23 08/29/2015 12:49 09/19/2015 12:32  PSA Latest Ref Range: <=4.00 ng/mL 120.10 (H) 222.30 (H) 146.80 (H) 110.60 (H) 14.89 (H) 5.12 (H) 2.57 1.05       Impression and Plan:  64 year old gentleman  with the following issues:  1. Prostate cancer diagnosed in June 2014. He presented with obstructive symptoms and found to have a Gleason score 4+4 = 8 prostate cancer with PSA of around 40. He was treated with androgen deprivation and radiation therapy with an excellent initial response and a PSA nadir of 0.33. PSA was up to 19 and Casodex was added.  He developed progression of disease with PSA up to 222 with liver metastasis.  He is currently on Taxotere chemotherapy and received 7 cycles. He had a robust a PSA decline down to 1.05 with excellent improvement in his clinical status. After discussing the risks and benefits of systemic chemotherapy he developed a to continue. The plan is to obtain a CT scan for staging purposes before the next cycle.  The plan is to proceed with cycle 8 without any dose reduction or delay. The plan is to get him to 10 cycles of possible.   2. Liver masses: biopsy-proven to be prostate cancer without any evidence of hepatocellular carcinoma. CT scan will be scheduled for the next visit.Marland Kitchen  3.  IV access: Right anterior chest Port-A-Cath placed 05/07/2015.  Has not reported any complications.  4. Weight loss:  Appetite appeared to be excellent with weight have increased but somewhat with his fluid retention related.  5. Antiemetics:  Compazine is available to the patient at this time.  6. Neutropenia prophylaxis: Neulasta will be added to his chemotherapy regimen.   7. Lower extremity edema: Likely related to Taxotere. Conservative measures did not help his symptoms and Lasix was ineffective. I have discontinued Lasix and potassium supplement and started him on Aldactone. We'll start at 25 mg and titrate up helped to control his edema. Complications from this medication including gynecomastia were reviewed.  8. Follow-up: Will be in 3 weeks prior to the next cycle of chemotherapy     SHADAD,FIRAS, MD 10/20/20169:32 AM

## 2015-10-10 NOTE — Telephone Encounter (Signed)
per pof to sch pt appt-sen MW email to sch trmt-adv pt to get updated copy b4 leaving-son stated has MY CHART and will review that as well

## 2015-10-10 NOTE — Patient Instructions (Signed)
[facility] Discharge Instructions for Patients Receiving Chemotherapy  Today you received the following chemotherapy agents:  Taxotere.  To help prevent nausea and vomiting after your treatment, we encourage you to take your nausea medication as directed.   If you develop nausea and vomiting that is not controlled by your nausea medication, call the clinic.   BELOW ARE SYMPTOMS THAT SHOULD BE REPORTED IMMEDIATELY:  *FEVER GREATER THAN 100.5 F  *CHILLS WITH OR WITHOUT FEVER  NAUSEA AND VOMITING THAT IS NOT CONTROLLED WITH YOUR NAUSEA MEDICATION  *UNUSUAL SHORTNESS OF BREATH  *UNUSUAL BRUISING OR BLEEDING  TENDERNESS IN MOUTH AND THROAT WITH OR WITHOUT PRESENCE OF ULCERS  *URINARY PROBLEMS  *BOWEL PROBLEMS  UNUSUAL RASH Items with * indicate a potential emergency and should be followed up as soon as possible.  Feel free to call the clinic you have any questions or concerns. The clinic phone number is [phone].  Please show the CHEMO ALERT CARD at check-in to the Emergency Department and triage nurse.  

## 2015-10-11 LAB — PSA: PSA: 0.39 ng/mL (ref <=4.00)

## 2015-10-12 ENCOUNTER — Ambulatory Visit (HOSPITAL_BASED_OUTPATIENT_CLINIC_OR_DEPARTMENT_OTHER): Payer: PPO

## 2015-10-12 VITALS — BP 114/54 | HR 82 | Temp 98.3°F | Resp 17

## 2015-10-12 DIAGNOSIS — Z5189 Encounter for other specified aftercare: Secondary | ICD-10-CM | POA: Diagnosis not present

## 2015-10-12 DIAGNOSIS — C61 Malignant neoplasm of prostate: Secondary | ICD-10-CM

## 2015-10-12 MED ORDER — PEGFILGRASTIM INJECTION 6 MG/0.6ML ~~LOC~~
6.0000 mg | PREFILLED_SYRINGE | Freq: Once | SUBCUTANEOUS | Status: AC
  Administered 2015-10-12: 6 mg via SUBCUTANEOUS

## 2015-10-12 NOTE — Patient Instructions (Signed)
Pegfilgrastim injection What is this medicine? PEGFILGRASTIM (PEG fil gra stim) is a long-acting granulocyte colony-stimulating factor that stimulates the growth of neutrophils, a type of white blood cell important in the body's fight against infection. It is used to reduce the incidence of fever and infection in patients with certain types of cancer who are receiving chemotherapy that affects the bone marrow, and to increase survival after being exposed to high doses of radiation. This medicine may be used for other purposes; ask your health care provider or pharmacist if you have questions. What should I tell my health care provider before I take this medicine? They need to know if you have any of these conditions: -kidney disease -latex allergy -ongoing radiation therapy -sickle cell disease -skin reactions to acrylic adhesives (On-Body Injector only) -an unusual or allergic reaction to pegfilgrastim, filgrastim, other medicines, foods, dyes, or preservatives -pregnant or trying to get pregnant -breast-feeding How should I use this medicine? This medicine is for injection under the skin. If you get this medicine at home, you will be taught how to prepare and give the pre-filled syringe or how to use the On-body Injector. Refer to the patient Instructions for Use for detailed instructions. Use exactly as directed. Take your medicine at regular intervals. Do not take your medicine more often than directed. It is important that you put your used needles and syringes in a special sharps container. Do not put them in a trash can. If you do not have a sharps container, call your pharmacist or healthcare provider to get one. Talk to your pediatrician regarding the use of this medicine in children. While this drug may be prescribed for selected conditions, precautions do apply. Overdosage: If you think you have taken too much of this medicine contact a poison control center or emergency room at  once. NOTE: This medicine is only for you. Do not share this medicine with others. What if I miss a dose? It is important not to miss your dose. Call your doctor or health care professional if you miss your dose. If you miss a dose due to an On-body Injector failure or leakage, a new dose should be administered as soon as possible using a single prefilled syringe for manual use. What may interact with this medicine? Interactions have not been studied. Give your health care provider a list of all the medicines, herbs, non-prescription drugs, or dietary supplements you use. Also tell them if you smoke, drink alcohol, or use illegal drugs. Some items may interact with your medicine. This list may not describe all possible interactions. Give your health care provider a list of all the medicines, herbs, non-prescription drugs, or dietary supplements you use. Also tell them if you smoke, drink alcohol, or use illegal drugs. Some items may interact with your medicine. What should I watch for while using this medicine? You may need blood work done while you are taking this medicine. If you are going to need a MRI, CT scan, or other procedure, tell your doctor that you are using this medicine (On-Body Injector only). What side effects may I notice from receiving this medicine? Side effects that you should report to your doctor or health care professional as soon as possible: -allergic reactions like skin rash, itching or hives, swelling of the face, lips, or tongue -dizziness -fever -pain, redness, or irritation at site where injected -pinpoint red spots on the skin -red or dark-brown urine -shortness of breath or breathing problems -stomach or side pain, or pain   at the shoulder -swelling -tiredness -trouble passing urine or change in the amount of urine Side effects that usually do not require medical attention (report to your doctor or health care professional if they continue or are  bothersome): -bone pain -muscle pain This list may not describe all possible side effects. Call your doctor for medical advice about side effects. You may report side effects to FDA at 1-800-FDA-1088. Where should I keep my medicine? Keep out of the reach of children. Store pre-filled syringes in a refrigerator between 2 and 8 degrees C (36 and 46 degrees F). Do not freeze. Keep in carton to protect from light. Throw away this medicine if it is left out of the refrigerator for more than 48 hours. Throw away any unused medicine after the expiration date. NOTE: This sheet is a summary. It may not cover all possible information. If you have questions about this medicine, talk to your doctor, pharmacist, or health care provider.    2016, [employer]/Gold Standard. (2014-12-27 14:30:14)  

## 2015-10-29 ENCOUNTER — Encounter (HOSPITAL_COMMUNITY): Payer: Self-pay

## 2015-10-29 ENCOUNTER — Ambulatory Visit (HOSPITAL_COMMUNITY)
Admission: RE | Admit: 2015-10-29 | Discharge: 2015-10-29 | Disposition: A | Discharge status: Home / Self Care | Payer: PPO | Source: Ambulatory Visit | Attending: Oncology | Admitting: Oncology

## 2015-10-29 DIAGNOSIS — C7800 Secondary malignant neoplasm of unspecified lung: Secondary | ICD-10-CM | POA: Insufficient documentation

## 2015-10-29 DIAGNOSIS — N329 Bladder disorder, unspecified: Secondary | ICD-10-CM | POA: Insufficient documentation

## 2015-10-29 DIAGNOSIS — C787 Secondary malignant neoplasm of liver and intrahepatic bile duct: Secondary | ICD-10-CM | POA: Diagnosis not present

## 2015-10-29 DIAGNOSIS — J90 Pleural effusion, not elsewhere classified: Secondary | ICD-10-CM | POA: Insufficient documentation

## 2015-10-29 DIAGNOSIS — C61 Malignant neoplasm of prostate: Secondary | ICD-10-CM | POA: Insufficient documentation

## 2015-10-29 DIAGNOSIS — R160 Hepatomegaly, not elsewhere classified: Secondary | ICD-10-CM | POA: Diagnosis present

## 2015-10-29 DIAGNOSIS — K4090 Unilateral inguinal hernia, without obstruction or gangrene, not specified as recurrent: Secondary | ICD-10-CM | POA: Diagnosis not present

## 2015-10-29 MED ORDER — IOHEXOL 300 MG/ML  SOLN
100.0000 mL | Freq: Once | INTRAMUSCULAR | Status: AC | PRN
Start: 2015-10-29 — End: 2015-10-29
  Administered 2015-10-29: 100 mL via INTRAVENOUS

## 2015-10-31 ENCOUNTER — Other Ambulatory Visit (HOSPITAL_BASED_OUTPATIENT_CLINIC_OR_DEPARTMENT_OTHER): Payer: PPO

## 2015-10-31 ENCOUNTER — Ambulatory Visit (HOSPITAL_BASED_OUTPATIENT_CLINIC_OR_DEPARTMENT_OTHER): Payer: PPO | Admitting: Oncology

## 2015-10-31 ENCOUNTER — Telehealth: Payer: Self-pay | Admitting: Oncology

## 2015-10-31 ENCOUNTER — Ambulatory Visit (HOSPITAL_BASED_OUTPATIENT_CLINIC_OR_DEPARTMENT_OTHER): Payer: PPO

## 2015-10-31 VITALS — BP 126/56 | HR 93 | Temp 98.5°F | Resp 18 | Ht 61.0 in | Wt 144.0 lb

## 2015-10-31 DIAGNOSIS — C61 Malignant neoplasm of prostate: Secondary | ICD-10-CM

## 2015-10-31 DIAGNOSIS — R609 Edema, unspecified: Secondary | ICD-10-CM | POA: Diagnosis not present

## 2015-10-31 DIAGNOSIS — C787 Secondary malignant neoplasm of liver and intrahepatic bile duct: Secondary | ICD-10-CM

## 2015-10-31 DIAGNOSIS — R634 Abnormal weight loss: Secondary | ICD-10-CM

## 2015-10-31 DIAGNOSIS — Z5111 Encounter for antineoplastic chemotherapy: Secondary | ICD-10-CM | POA: Diagnosis not present

## 2015-10-31 LAB — COMPREHENSIVE METABOLIC PANEL (CC13)
ALT: 11 U/L (ref 0–55)
ANION GAP: 8 meq/L (ref 3–11)
AST: 16 U/L (ref 5–34)
Albumin: 3.2 g/dL — ABNORMAL LOW (ref 3.5–5.0)
Alkaline Phosphatase: 84 U/L (ref 40–150)
BUN: 10.1 mg/dL (ref 7.0–26.0)
CHLORIDE: 108 meq/L (ref 98–109)
CO2: 24 meq/L (ref 22–29)
CREATININE: 0.8 mg/dL (ref 0.7–1.3)
Calcium: 8.9 mg/dL (ref 8.4–10.4)
EGFR: >90 mL/min/{1.73_m2} (ref >90)
Glucose: 106 mg/dl (ref 70–140)
POTASSIUM: 4.1 meq/L (ref 3.5–5.1)
Sodium: 140 mEq/L (ref 136–145)
Total Bilirubin: 0.43 mg/dL (ref 0.20–1.20)
Total Protein: 6 g/dL — ABNORMAL LOW (ref 6.4–8.3)

## 2015-10-31 LAB — CBC WITH DIFFERENTIAL/PLATELET
BASO%: 0.9 % (ref 0.0–2.0)
BASOS ABS: 0 10*3/uL (ref 0.0–0.1)
EOS ABS: 0 10*3/uL (ref 0.0–0.5)
EOS%: 0.3 % (ref 0.0–7.0)
HCT: 32.5 % — ABNORMAL LOW (ref 38.4–49.9)
HGB: 10.4 g/dL — ABNORMAL LOW (ref 13.0–17.1)
LYMPH#: 0.6 10*3/uL — AB (ref 0.9–3.3)
LYMPH%: 17.3 % (ref 14.0–49.0)
MCH: 27.7 pg (ref 27.2–33.4)
MCHC: 32 g/dL (ref 32.0–36.0)
MCV: 86.4 fL (ref 79.3–98.0)
MONO#: 0.5 10*3/uL (ref 0.1–0.9)
MONO%: 15.8 % — ABNORMAL HIGH (ref 0.0–14.0)
NEUT#: 2.2 10*3/uL (ref 1.5–6.5)
NEUT%: 65.7 % (ref 39.0–75.0)
PLATELETS: 195 10*3/uL (ref 140–400)
RBC: 3.76 10*6/uL — AB (ref 4.20–5.82)
RDW: 18.4 % — AB (ref 11.0–14.6)
WBC: 3.3 10*3/uL — AB (ref 4.0–10.3)

## 2015-10-31 MED ORDER — SODIUM CHLORIDE 0.9 % IJ SOLN
10.0000 mL | INTRAMUSCULAR | Status: DC | PRN
  Administered 2015-10-31: 10 mL
  Filled 2015-10-31: qty 10

## 2015-10-31 MED ORDER — SODIUM CHLORIDE 0.9 % IV SOLN
Freq: Once | INTRAVENOUS | Status: AC
  Administered 2015-10-31: 10:00:00 via INTRAVENOUS

## 2015-10-31 MED ORDER — DOCETAXEL CHEMO INJECTION 160 MG/16ML
75.0000 mg/m2 | Freq: Once | INTRAVENOUS | Status: AC
  Administered 2015-10-31: 120 mg via INTRAVENOUS
  Filled 2015-10-31: qty 12

## 2015-10-31 MED ORDER — HEPARIN SOD (PORK) LOCK FLUSH 100 UNIT/ML IV SOLN
500.0000 [IU] | Freq: Once | INTRAVENOUS | Status: AC | PRN
  Administered 2015-10-31: 500 [IU]
  Filled 2015-10-31: qty 5

## 2015-10-31 MED ORDER — SODIUM CHLORIDE 0.9 % IV SOLN
Freq: Once | INTRAVENOUS | Status: AC
  Administered 2015-10-31: 10:00:00 via INTRAVENOUS
  Filled 2015-10-31: qty 4

## 2015-10-31 NOTE — Progress Notes (Signed)
Hematology and Oncology Follow Up Visit  [name] Johnston [id number] 10-02-1951 64 y.o. 10/31/2015 9:38 AM KevinJohnston, MDDewey, [name], MD   Principle Diagnosis: 64 year old gentleman with prostate cancer diagnosed in 2014. He presented with obstructive symptoms and found to have a Gleason score 4+4 = 8 prostate cancer with PSA of around 40.  Liver mass diagnosed in December 2015 with an elevated AFP of 9.3. Biopsy proven to be prostate cancer.  Prior Therapy:  He is started [place] on 06/27/2013. He subsequently received definitive radiation therapy for a planned dose of 75 gray completed in November 2014 under the care of Dr. Tammi Klippel.  His PSA subsequently noted close to 0.33. Most recently his PSA was up to 11 on 10/29/2014 with a castrate levels of testosterone of 19. Staging workup did not reveal any bulky metastasis.  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  Current therapy: Docetaxel 75 mg/m given every 3 weeks with Neulasta support. Cycle one given on 05/16/2015. He is here for cycle 9.  Interim History:  [name] presents today for a follow-up visit with his family as well as interpreter. Since his last visit, he reports doing about the same. He continues to have increase lower extremity edema despite being on Aldactone. The swelling to be unchanged. Does not report any abdominal distention or ascites. Does not report any other fluid retention anywhere including shortness of breath or difficulty breathing.    He continues tolerate systemic chemotherapy with docetaxel with Neulasta support without any other complaints. He has reported grade 1 fatigue and no nausea or vomiting. He does not report any peripheral neuropathy or infusion related complications. His performance status and quality of life continue to be stable without any decline. His appetite is excellent and is able to maintain his weight.   He does report  diffuse arthralgias and myalgias at times but does not take hydrocodone at this time.  He does not report any headaches or blurry vision or syncope. He does not report any fevers, chills, or sweats.  He does not report any frequency urgency but does report dysuria and occasional hematuria. He does not report any skeletal complaints of back pain shoulder pain or hip pain. He does not report any lymphadenopathy or petechiae. Remainder of his review of systems unremarkable.   Medications: I have reviewed the patient's current medications.  Current Outpatient Prescriptions  Medication Sig Dispense Refill  . acetaminophen (TYLENOL) 325 MG tablet Take 650 mg by mouth every 6 (six) hours as needed.    Marland Kitchen HYDROcodone-acetaminophen (NORCO/VICODIN) 5-325 MG per tablet Take 1-2 tablets by mouth every 6 (six) hours as needed for moderate pain. 20 tablet 0  . lidocaine-prilocaine (EMLA) cream Apply 1 application topically as needed. 30 g 0  . omeprazole (PRILOSEC) 20 MG capsule Take 20 mg by mouth daily.    . polyethylene glycol powder (MIRALAX) powder Mix 17 grams of powder with 8 ounces of liquid daily by mouth. 527 g 2  . prochlorperazine (COMPAZINE) 10 MG tablet Take 1 tablet (10 mg total) by mouth every 6 (six) hours as needed for nausea or vomiting. 30 tablet 0  . solifenacin (VESICARE) 10 MG tablet Take 1 tablet (10 mg total) by mouth daily. 30 tablet 6  . spironolactone (ALDACTONE) 25 MG tablet Take 1 tablet (25 mg total) by mouth daily. 30 tablet 1   No current facility-administered medications for this visit.     Allergies:  Allergies  Allergen Reactions  .  Aspirin Hives    Past Medical History, Surgical history, Social history, and Family History were reviewed and updated.    Physical Exam: Blood pressure 126/56, pulse 93, temperature 98.5 F (36.9 C), temperature source Oral, resp. rate 18, height 5\' 1"  (1.549 m), weight 144 lb (65.318 kg), SpO2 100 %. ECOG: 1 General appearance:  alert and cooperative. Not in any distress. Head: Normocephalic, without obvious abnormality. No oral thrush or ulcers. Neck: no adenopathy Lymph nodes: Cervical, supraclavicular, and axillary nodes normal. Heart:regular rate and rhythm, S1, S2 normal, no murmur, click, rub or gallop  Chest wall examination: Showed a Port-A-Cath to be in good shape without abnormalities.  Lung:chest clear, no wheezing, rales, normal symmetric air entry Abdomin: soft, non-tender, without masses or organomegaly. No shifting dullness or ascites. EXT:2+ edema unchanged from previous examination to the level of the shin. Skin: dry skin noted without any erythema or induration.   Lab Results: Lab Results  Component Value Date   WBC 3.3* 10/31/2015   HGB 10.4* 10/31/2015   HCT 32.5* 10/31/2015   MCV 86.4 10/31/2015   PLT 195 10/31/2015     Chemistry      Component Value Date/Time   NA 141 10/10/2015 0855   NA 138 04/15/2015 2219   K 4.0 10/10/2015 0855   K 4.5 04/15/2015 2219   CL 102 04/15/2015 2219   CO2 26 10/10/2015 0855   CO2 25 04/15/2015 2219   BUN 8.6 10/10/2015 0855   BUN 13 04/15/2015 2219   CREATININE 0.8 10/10/2015 0855   CREATININE 0.95 04/15/2015 2219      Component Value Date/Time   CALCIUM 8.8 10/10/2015 0855   CALCIUM 9.5 04/15/2015 2219   ALKPHOS 95 10/10/2015 0855   ALKPHOS 92 04/15/2015 2219   AST 19 10/10/2015 0855   AST 24 04/15/2015 2219   ALT 15 10/10/2015 0855   ALT 21 04/15/2015 2219   BILITOT 0.37 10/10/2015 0855   BILITOT 0.8 04/15/2015 2219       Results for [name] Johnston, [name] Johnston (MRN [id number]) as of 10/31/2015 09:40  Ref. Range 08/29/2015 12:49 09/19/2015 12:32 10/10/2015 08:54  PSA Latest Ref Range: <=4.00 ng/mL 2.57 1.05 0.39        EXAM: CT ABDOMEN AND PELVIS WITH CONTRAST  TECHNIQUE: Multidetector CT imaging of the abdomen and pelvis was performed using the standard protocol following bolus administration of intravenous contrast.  CONTRAST: 172mL  OMNIPAQUE IOHEXOL 300 MG/ML SOLN  COMPARISON: 04/16/2015 CT abdomen/pelvis.  FINDINGS: Partially motion degraded study.  Lower chest: New small layering bilateral pleural effusions with mild dependent passive lower lobe atelectasis. Right middle lobe 5 mm solid pulmonary nodule, decreased from 10 mm (series 4/ image 2). Basilar right lower lobe 4 mm solid pulmonary nodule (4/10), decreased from 6 mm. No new significant pulmonary nodules at the lung bases. The tip of a central venous catheter is seen at the cavoatrial junction.  Hepatobiliary: There is a hypodense 3.9 x 3.2 cm segment 7 right liver lobe mass (series 2/ image 16), decreased from 7.4 x 6.1 cm. There is a hypodense 3.4 x 2.5 cm segment 4A liver mass (2/19), decreased from 4.9 x 3.4 cm. No new liver masses. Normal gallbladder with no radiopaque cholelithiasis. No biliary ductal dilatation.  Pancreas: Normal, with no mass or duct dilation.  Spleen: Normal size. No mass.  Adrenals/Urinary Tract: Normal adrenals. Stable simple 1.2 cm posterior lower left renal cyst. Otherwise normal kidneys, with no hydronephrosis. Relatively collapsed bladder. Haziness of the bladder wall.  Stomach/Bowel: Grossly normal stomach. Normal caliber small bowel with no small bowel wall thickening. Normal appendix. Normal large bowel with no diverticulosis, large bowel wall thickening or pericolonic fat stranding.  Vascular/Lymphatic: Atherosclerotic nonaneurysmal abdominal aorta. Patent portal, splenic, hepatic and renal veins. No pathologically enlarged lymph nodes in the abdomen or pelvis.  Reproductive: Slightly atrophic prostate with multiple stereotactic radiation markers within and adjacent to the prostate.  Other: No pneumoperitoneum, ascites or focal fluid collection.  Musculoskeletal: No aggressive appearing focal osseous lesions. Mild-to-moderate degenerative changes in the visualized thoracolumbar spine.  Stable small fat containing left inguinal hernia.  IMPRESSION: 1. Partial interval treatment response in the two right lung base pulmonary metastases and in the two liver metastases. No new sites of metastatic disease in the abdomen or pelvis. 2. New haziness of the bladder wall, differential includes post treatment change or acute infectious cystitis. Recommend correlation with urinalysis. 3. New small bilateral pleural effusions. 4. Stable small fat containing left inguinal hernia.   Impression and Plan:  64 year old gentleman with the following issues:  1. Prostate cancer diagnosed in June 2014. He presented with obstructive symptoms and found to have a Gleason score 4+4 = 8 prostate cancer with PSA of around 40. He was treated with androgen deprivation and radiation therapy with an excellent initial response and a PSA nadir of 0.33. PSA was up to 19 and Casodex was added.  He developed progression of disease with PSA up to 222 with liver metastasis.  He is currently on Taxotere chemotherapy and received 8 cycles. His CT scan of the abdomen and pelvis on 10/29/2015 was reviewed today and showed robust response to chemotherapy. His PSA at continued to drop down dramatically down to less than 1. The plan is to continue with the same dose and schedule and completed 10 cycles of therapy. He'll have a treatment break after that due to cumulative toxicities.   2. Liver masses: biopsy-proven to be prostate cancer without any evidence of hepatocellular carcinoma. CT scan showed response to therapy..  3.  IV access: Right anterior chest Port-A-Cath placed 05/07/2015.  Has not reported any complications.  4. Weight loss:  Appetite appeared to be excellent with weight have increased but somewhat with his fluid retention related.  5. Antiemetics:  Compazine is available to the patient at this time.  6. Neutropenia prophylaxis: Neulasta will be added to his chemotherapy regimen.   7. Lower  extremity edema: Likely related to Taxotere. I continued due to advised him to use Aldactone and I anticipate that this will improve after discontinuation of chemotherapy.  8. Pain: Diffuse in nature and likely related to arthralgias and myalgias of chemotherapy and Neulasta. Advised him to use hydrocodone as needed.  9. Follow-up: Will be in 3 weeks prior to the next cycle of chemotherapy     SHADAD,FIRAS, MD 11/10/20169:38 AM

## 2015-10-31 NOTE — Patient Instructions (Signed)
[facility] Discharge Instructions for Patients Receiving Chemotherapy  Today you received the following chemotherapy agents: Taxotere.  To help prevent nausea and vomiting after your treatment, we encourage you to take your nausea medication: Compazine. Take one every 6 hours as needed.  If you develop nausea and vomiting that is not controlled by your nausea medication, call the clinic.   BELOW ARE SYMPTOMS THAT SHOULD BE REPORTED IMMEDIATELY:  *FEVER GREATER THAN 100.5 F  *CHILLS WITH OR WITHOUT FEVER  NAUSEA AND VOMITING THAT IS NOT CONTROLLED WITH YOUR NAUSEA MEDICATION  *UNUSUAL SHORTNESS OF BREATH  *UNUSUAL BRUISING OR BLEEDING  TENDERNESS IN MOUTH AND THROAT WITH OR WITHOUT PRESENCE OF ULCERS  *URINARY PROBLEMS  *BOWEL PROBLEMS  UNUSUAL RASH Items with * indicate a potential emergency and should be followed up as soon as possible.  Feel free to call the clinic should you have any questions or concerns. The clinic phone number is [phone].  Please show the CHEMO ALERT CARD at check-in to the Emergency Department and triage nurse.   

## 2015-10-31 NOTE — Telephone Encounter (Signed)
Per 11/10 pof added tx for 12/1. Patient already on schedule for lab/fu 12/1 and inj 12/3. Gave patient avs report and appointments for November and December.

## 2015-11-01 ENCOUNTER — Ambulatory Visit: Payer: PPO

## 2015-11-01 LAB — PSA: PSA: 0.17 ng/mL (ref <=4.00)

## 2015-11-02 ENCOUNTER — Ambulatory Visit (HOSPITAL_BASED_OUTPATIENT_CLINIC_OR_DEPARTMENT_OTHER): Payer: PPO

## 2015-11-02 VITALS — BP 119/49 | HR 78 | Temp 97.5°F | Resp 18

## 2015-11-02 DIAGNOSIS — C61 Malignant neoplasm of prostate: Secondary | ICD-10-CM

## 2015-11-02 DIAGNOSIS — Z5189 Encounter for other specified aftercare: Secondary | ICD-10-CM | POA: Diagnosis not present

## 2015-11-02 MED ORDER — PEGFILGRASTIM INJECTION 6 MG/0.6ML ~~LOC~~
6.0000 mg | PREFILLED_SYRINGE | Freq: Once | SUBCUTANEOUS | Status: AC
  Administered 2015-11-02: 6 mg via SUBCUTANEOUS

## 2015-11-02 NOTE — Patient Instructions (Signed)
Pegfilgrastim injection (Neulasta) What is this medicine?  PEGFILGRASTIM (PEG fil gra stim) is a long-acting granulocyte colony-stimulating factor that stimulates the growth of neutrophils, a type of white blood cell important in the body's fight against infection. It is used to reduce the incidence of fever and infection in patients with certain types of cancer who are receiving chemotherapy that affects the bone marrow, and to increase survival after being exposed to high doses of radiation. This medicine may be used for other purposes; ask your health care provider or pharmacist if you have questions. What should I tell my health care provider before I take this medicine? They need to know if you have any of these conditions: -kidney disease -latex allergy -ongoing radiation therapy -sickle cell disease -skin reactions to acrylic adhesives (On-Body Injector only) -an unusual or allergic reaction to pegfilgrastim, filgrastim, other medicines, foods, dyes, or preservatives -pregnant or trying to get pregnant -breast-feeding How should I use this medicine? This medicine is for injection under the skin. If you get this medicine at home, you will be taught how to prepare and give the pre-filled syringe or how to use the On-body Injector. Refer to the patient Instructions for Use for detailed instructions. Use exactly as directed. Take your medicine at regular intervals. Do not take your medicine more often than directed. It is important that you put your used needles and syringes in a special sharps container. Do not put them in a trash can. If you do not have a sharps container, call your pharmacist or healthcare provider to get one. Talk to your pediatrician regarding the use of this medicine in children. While this drug may be prescribed for selected conditions, precautions do apply. Overdosage: If you think you have taken too much of this medicine contact a poison control center or emergency  room at once. NOTE: This medicine is only for you. Do not share this medicine with others. What if I miss a dose? It is important not to miss your dose. Call your doctor or health care professional if you miss your dose. If you miss a dose due to an On-body Injector failure or leakage, a new dose should be administered as soon as possible using a single prefilled syringe for manual use. What may interact with this medicine? Interactions have not been studied. Give your health care provider a list of all the medicines, herbs, non-prescription drugs, or dietary supplements you use. Also tell them if you smoke, drink alcohol, or use illegal drugs. Some items may interact with your medicine. This list may not describe all possible interactions. Give your health care provider a list of all the medicines, herbs, non-prescription drugs, or dietary supplements you use. Also tell them if you smoke, drink alcohol, or use illegal drugs. Some items may interact with your medicine. What should I watch for while using this medicine? You may need blood work done while you are taking this medicine. If you are going to need a MRI, CT scan, or other procedure, tell your doctor that you are using this medicine (On-Body Injector only). What side effects may I notice from receiving this medicine? Side effects that you should report to your doctor or health care professional as soon as possible: -allergic reactions like skin rash, itching or hives, swelling of the face, lips, or tongue -dizziness -fever -pain, redness, or irritation at site where injected -pinpoint red spots on the skin -red or dark-brown urine -shortness of breath or breathing problems -stomach or side pain,   or pain at the shoulder -swelling -tiredness -trouble passing urine or change in the amount of urine Side effects that usually do not require medical attention (report to your doctor or health care professional if they continue or are  bothersome): -bone pain -muscle pain This list may not describe all possible side effects. Call your doctor for medical advice about side effects. You may report side effects to FDA at 1-800-FDA-1088. Where should I keep my medicine? Keep out of the reach of children. Store pre-filled syringes in a refrigerator between 2 and 8 degrees C (36 and 46 degrees F). Do not freeze. Keep in carton to protect from light. Throw away this medicine if it is left out of the refrigerator for more than 48 hours. Throw away any unused medicine after the expiration date. NOTE: This sheet is a summary. It may not cover all possible information. If you have questions about this medicine, talk to your doctor, pharmacist, or health care provider.    2016, [employer]. (2014-12-27 14:30:14)  

## 2015-11-21 ENCOUNTER — Other Ambulatory Visit (HOSPITAL_BASED_OUTPATIENT_CLINIC_OR_DEPARTMENT_OTHER): Payer: PPO

## 2015-11-21 ENCOUNTER — Ambulatory Visit (HOSPITAL_BASED_OUTPATIENT_CLINIC_OR_DEPARTMENT_OTHER): Payer: PPO

## 2015-11-21 ENCOUNTER — Telehealth: Payer: Self-pay | Admitting: Oncology

## 2015-11-21 ENCOUNTER — Ambulatory Visit (HOSPITAL_BASED_OUTPATIENT_CLINIC_OR_DEPARTMENT_OTHER): Payer: PPO | Admitting: Oncology

## 2015-11-21 VITALS — BP 116/55 | HR 84 | Temp 98.3°F | Resp 18 | Ht 61.0 in | Wt 145.4 lb

## 2015-11-21 DIAGNOSIS — C787 Secondary malignant neoplasm of liver and intrahepatic bile duct: Secondary | ICD-10-CM

## 2015-11-21 DIAGNOSIS — C61 Malignant neoplasm of prostate: Secondary | ICD-10-CM

## 2015-11-21 DIAGNOSIS — Z5111 Encounter for antineoplastic chemotherapy: Secondary | ICD-10-CM

## 2015-11-21 LAB — COMPREHENSIVE METABOLIC PANEL (CC13)
ALBUMIN: 3.1 g/dL — AB (ref 3.5–5.0)
ALK PHOS: 86 U/L (ref 40–150)
ALT: 11 U/L (ref 0–55)
AST: 18 U/L (ref 5–34)
Anion Gap: 8 mEq/L (ref 3–11)
BUN: 9.6 mg/dL (ref 7.0–26.0)
CALCIUM: 8.6 mg/dL (ref 8.4–10.4)
CHLORIDE: 107 meq/L (ref 98–109)
CO2: 25 mEq/L (ref 22–29)
Creatinine: 0.8 mg/dL (ref 0.7–1.3)
Glucose: 96 mg/dl (ref 70–140)
POTASSIUM: 3.9 meq/L (ref 3.5–5.1)
SODIUM: 140 meq/L (ref 136–145)
Total Bilirubin: 0.4 mg/dL (ref 0.20–1.20)
Total Protein: 6.1 g/dL — ABNORMAL LOW (ref 6.4–8.3)

## 2015-11-21 LAB — CBC WITH DIFFERENTIAL/PLATELET
BASO%: 1.8 % (ref 0.0–2.0)
BASOS ABS: 0 10*3/uL (ref 0.0–0.1)
EOS ABS: 0 10*3/uL (ref 0.0–0.5)
EOS%: 0.1 % (ref 0.0–7.0)
HEMATOCRIT: 31.7 % — AB (ref 38.4–49.9)
HEMOGLOBIN: 10.1 g/dL — AB (ref 13.0–17.1)
LYMPH%: 20.1 % (ref 14.0–49.0)
MCH: 27.6 pg (ref 27.2–33.4)
MCHC: 31.9 g/dL — AB (ref 32.0–36.0)
MCV: 86.6 fL (ref 79.3–98.0)
MONO#: 0.4 10*3/uL (ref 0.1–0.9)
MONO%: 14.5 % — AB (ref 0.0–14.0)
NEUT#: 1.7 10*3/uL (ref 1.5–6.5)
NEUT%: 63.5 % (ref 39.0–75.0)
Platelets: 188 10*3/uL (ref 140–400)
RBC: 3.66 10*6/uL — ABNORMAL LOW (ref 4.20–5.82)
RDW: 18.4 % — AB (ref 11.0–14.6)
WBC: 2.6 10*3/uL — ABNORMAL LOW (ref 4.0–10.3)
lymph#: 0.5 10*3/uL — ABNORMAL LOW (ref 0.9–3.3)

## 2015-11-21 MED ORDER — SODIUM CHLORIDE 0.9 % IV SOLN
Freq: Once | INTRAVENOUS | Status: AC
  Administered 2015-11-21: 13:00:00 via INTRAVENOUS

## 2015-11-21 MED ORDER — SODIUM CHLORIDE 0.9 % IV SOLN
Freq: Once | INTRAVENOUS | Status: AC
  Administered 2015-11-21: 13:00:00 via INTRAVENOUS
  Filled 2015-11-21: qty 4

## 2015-11-21 MED ORDER — SODIUM CHLORIDE 0.9 % IJ SOLN
10.0000 mL | INTRAMUSCULAR | Status: DC | PRN
  Administered 2015-11-21: 10 mL
  Filled 2015-11-21: qty 10

## 2015-11-21 MED ORDER — DOCETAXEL CHEMO INJECTION 160 MG/16ML
75.0000 mg/m2 | Freq: Once | INTRAVENOUS | Status: AC
  Administered 2015-11-21: 120 mg via INTRAVENOUS
  Filled 2015-11-21: qty 12

## 2015-11-21 MED ORDER — OXYCODONE HCL 5 MG PO TABS: 5.0000 mg | ORAL_TABLET | ORAL | Status: DC | PRN

## 2015-11-21 MED ORDER — HEPARIN SOD (PORK) LOCK FLUSH 100 UNIT/ML IV SOLN
500.0000 [IU] | Freq: Once | INTRAVENOUS | Status: AC | PRN
  Administered 2015-11-21: 500 [IU]
  Filled 2015-11-21: qty 5

## 2015-11-21 NOTE — Patient Instructions (Signed)
[facility] Discharge Instructions for Patients Receiving Chemotherapy  Today you received the following chemotherapy agents: Taxotere.  To help prevent nausea and vomiting after your treatment, we encourage you to take your nausea medication: Compazine. Take one every 6 hours as needed.  If you develop nausea and vomiting that is not controlled by your nausea medication, call the clinic.   BELOW ARE SYMPTOMS THAT SHOULD BE REPORTED IMMEDIATELY:  *FEVER GREATER THAN 100.5 F  *CHILLS WITH OR WITHOUT FEVER  NAUSEA AND VOMITING THAT IS NOT CONTROLLED WITH YOUR NAUSEA MEDICATION  *UNUSUAL SHORTNESS OF BREATH  *UNUSUAL BRUISING OR BLEEDING  TENDERNESS IN MOUTH AND THROAT WITH OR WITHOUT PRESENCE OF ULCERS  *URINARY PROBLEMS  *BOWEL PROBLEMS  UNUSUAL RASH Items with * indicate a potential emergency and should be followed up as soon as possible.  Feel free to call the clinic should you have any questions or concerns. The clinic phone number is [phone].  Please show the CHEMO ALERT CARD at check-in to the Emergency Department and triage nurse.   

## 2015-11-21 NOTE — Telephone Encounter (Signed)
Gave patient appt for 01/03 @ 11:45 lab, 12 md. Per POF

## 2015-11-21 NOTE — Progress Notes (Signed)
Hematology and Oncology Follow Up Visit  [name] Johnston [id number] 11-17-1951 64 y.o. 11/21/2015 12:02 PM [name] Johnston, MDDewey, [name], MD   Principle Diagnosis: 64 year old gentleman with prostate cancer diagnosed in 2014. He presented with obstructive symptoms and found to have a Gleason score 4+4 = 8 prostate cancer with PSA of around 40.  Liver mass diagnosed in December 2015 with an elevated AFP of 9.3. Biopsy proven to be prostate cancer.  Prior Therapy:  He is started [place] on 06/27/2013. He subsequently received definitive radiation therapy for a planned dose of 75 gray completed in November 2014 under the care of Dr. Tammi Klippel.  His PSA subsequently noted close to 0.33. PSA was up to 11 on 10/29/2014 with a castrate levels of testosterone of 19.   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  Current therapy: Docetaxel 75 mg/m given every 3 weeks with Neulasta support. Cycle one given on 05/16/2015. He is here for cycle 10.  Interim History:  [name] Johnston presents today for a follow-up visit with his family as well as interpreter. Since his last visit, he continues to report cumulative toxicity associated with chemotherapy. Have reported lower extremity edema as well as diffuse arthralgias and myalgias. He takes occasionally hydrocodone but does not help his symptoms.  He continues to have increase lower extremity edema despite being on Aldactone. He does not report any abdominal distention or ascites. Does not report any other fluid retention anywhere including shortness of breath or difficulty breathing.  He does not report any other side effects associated with chemotherapy. Has not reported any peripheral neuropathy, nausea, vomiting or infusion related complications. His performance status and activity level remain about the same. His appetite is excellent and have gained more weight.  He does not report any headaches or  blurry vision or syncope. He does not report any fevers, chills, or sweats.  He does not report any frequency urgency but does report dysuria and occasional hematuria. He does not report any skeletal complaints of back pain shoulder pain or hip pain. He does not report any lymphadenopathy or petechiae. Remainder of his review of systems unremarkable.   Medications: I have reviewed the patient's current medications.  Current Outpatient Prescriptions  Medication Sig Dispense Refill  . acetaminophen (TYLENOL) 325 MG tablet Take 650 mg by mouth every 6 (six) hours as needed.    . lidocaine-prilocaine (EMLA) cream Apply 1 application topically as needed. 30 g 0  . omeprazole (PRILOSEC) 20 MG capsule Take 20 mg by mouth daily.    . polyethylene glycol powder (MIRALAX) powder Mix 17 grams of powder with 8 ounces of liquid daily by mouth. 527 g 2  . solifenacin (VESICARE) 10 MG tablet Take 1 tablet (10 mg total) by mouth daily. 30 tablet 6  . spironolactone (ALDACTONE) 25 MG tablet Take 1 tablet (25 mg total) by mouth daily. 30 tablet 1  . oxyCODONE (OXY IR/ROXICODONE) 5 MG immediate release tablet Take 1 tablet (5 mg total) by mouth every 4 (four) hours as needed for severe pain. 30 tablet 0  . prochlorperazine (COMPAZINE) 10 MG tablet Take 1 tablet (10 mg total) by mouth every 6 (six) hours as needed for nausea or vomiting. (Patient not taking: Reported on 11/21/2015) 30 tablet 0   No current facility-administered medications for this visit.     Allergies:  Allergies  Allergen Reactions  . Aspirin Hives    Past Medical History, Surgical history, Social history, and Family History were  reviewed and updated.    Physical Exam: Blood pressure 116/55, pulse 84, temperature 98.3 F (36.8 C), temperature source Oral, resp. rate 18, height 5\' 1"  (1.549 m), weight 145 lb 6.4 oz (65.953 kg), SpO2 100 %. ECOG: 1 General appearance: alert and cooperative. Well-appearing without distress. Head:  Normocephalic, without obvious abnormality. No oral ulcers or lesions. Neck: no adenopathy no thyroid masses. Lymph nodes: Cervical, supraclavicular, and axillary nodes normal. Heart:regular rate and rhythm, S1, S2 normal, no murmur, click, rub or gallop  Chest wall examination: Showed a Port-A-Cath to be in good shape without abnormalities.  Lung:chest clear, no wheezing, rales, normal symmetric air entry Abdomin: soft, non-tender, without masses or organomegaly. No shifting dullness or ascites. EXT:2+ edema noted. Skin: dry skin noted without any erythema or induration.    Lab Results: Lab Results  Component Value Date   WBC 2.6* 11/21/2015   HGB 10.1* 11/21/2015   HCT 31.7* 11/21/2015   MCV 86.6 11/21/2015   PLT 188 11/21/2015     Chemistry      Component Value Date/Time   NA 140 10/31/2015 0906   NA 138 04/15/2015 2219   K 4.1 10/31/2015 0906   K 4.5 04/15/2015 2219   CL 102 04/15/2015 2219   CO2 24 10/31/2015 0906   CO2 25 04/15/2015 2219   BUN 10.1 10/31/2015 0906   BUN 13 04/15/2015 2219   CREATININE 0.8 10/31/2015 0906   CREATININE 0.95 04/15/2015 2219      Component Value Date/Time   CALCIUM 8.9 10/31/2015 0906   CALCIUM 9.5 04/15/2015 2219   ALKPHOS 84 10/31/2015 0906   ALKPHOS 92 04/15/2015 2219   AST 16 10/31/2015 0906   AST 24 04/15/2015 2219   ALT 11 10/31/2015 0906   ALT 21 04/15/2015 2219   BILITOT 0.43 10/31/2015 0906   BILITOT 0.8 04/15/2015 2219          Results for [name] Johnston, [name] Johnston (MRN [id number]) as of 11/21/2015 11:45  Ref. Range 09/19/2015 12:32 10/10/2015 08:54 10/31/2015 09:06  PSA Latest Ref Range: <=4.00 ng/mL 1.05 0.39 0.17       Impression and Plan:  64 year old gentleman with the following issues:  1. Prostate cancer diagnosed in June 2014. He presented with obstructive symptoms and found to have a Gleason score 4+4 = 8 prostate cancer with PSA of around 40. He was treated with androgen deprivation and radiation therapy with an  excellent initial response and a PSA nadir of 0.33. PSA was up to 19 and Casodex was added.  He developed progression of disease with PSA up to 222 with liver metastasis.  He is currently on Taxotere chemotherapy and received 9 cycles. His CT scan of the abdomen and pelvis on 10/29/2015 showed excellent response to chemotherapy. His PSA at continued to drop down dramatically down to l0.17.   The plan is to proceed with cycle 10 of Taxotere chemotherapy today and suspend treatment temporarily until his symptoms resolve. I feel that his lower extremity edema as well as myalgias related to Taxotere and should improve upon that treatment holiday.  We need to continue to monitor him closely and reinstitute chemotherapy in the near future in attempt to prevent further progression of his cancer.   2. Liver masses: biopsy-proven to be prostate cancer without any evidence of hepatocellular carcinoma. CT scan on 10/29/2015 showed response to therapy by close to 50%.  3.  IV access: Right anterior chest Port-A-Cath placed 05/07/2015.  Has not reported any complications. This will be need  to be flushed periodically after chemotherapy.  4. Weight loss:  Appetite appeared to be excellent with weight has slightly increased.  5. Antiemetics:  Compazine is available to the patient at this time. No nausea reported since the last visit.  6. Neutropenia prophylaxis: Neulasta will be added to his chemotherapy regimen. This will be stopped after conclusion of chemotherapy and last injection will be on 11/23/2015.  7. Lower extremity edema: Likely related to Taxotere. I recommended Aldactone at the current dose and we'll consider increasing the dose of no improvement of chemotherapy.  8. Pain: Diffuse in nature and likely related to arthralgias and myalgias of chemotherapy and Neulasta. It appears that hydrocodone is not helping him and I switched her to oxycodone with instructions to use it.  9. Follow-up: Will  be in 4 weeks to assess his clinical status.    [facility], MD 12/1/201612:02 PM

## 2015-11-22 ENCOUNTER — Ambulatory Visit (HOSPITAL_BASED_OUTPATIENT_CLINIC_OR_DEPARTMENT_OTHER): Payer: PPO

## 2015-11-22 VITALS — BP 116/54 | HR 82 | Temp 98.4°F

## 2015-11-22 DIAGNOSIS — Z5189 Encounter for other specified aftercare: Secondary | ICD-10-CM

## 2015-11-22 DIAGNOSIS — C61 Malignant neoplasm of prostate: Secondary | ICD-10-CM | POA: Diagnosis not present

## 2015-11-22 LAB — PSA: PSA: 0.17 ng/mL (ref <=4.00)

## 2015-11-22 MED ORDER — PEGFILGRASTIM INJECTION 6 MG/0.6ML ~~LOC~~
6.0000 mg | PREFILLED_SYRINGE | Freq: Once | SUBCUTANEOUS | Status: AC
  Administered 2015-11-22: 6 mg via SUBCUTANEOUS
  Filled 2015-11-22: qty 0.6

## 2015-11-23 ENCOUNTER — Ambulatory Visit: Payer: PPO

## 2015-12-24 ENCOUNTER — Telehealth: Payer: Self-pay | Admitting: Oncology

## 2015-12-24 ENCOUNTER — Ambulatory Visit (HOSPITAL_BASED_OUTPATIENT_CLINIC_OR_DEPARTMENT_OTHER): Payer: PPO | Admitting: Oncology

## 2015-12-24 ENCOUNTER — Other Ambulatory Visit (HOSPITAL_BASED_OUTPATIENT_CLINIC_OR_DEPARTMENT_OTHER): Payer: PPO

## 2015-12-24 VITALS — BP 129/57 | HR 81 | Temp 98.2°F | Resp 18 | Ht 61.0 in | Wt 147.3 lb

## 2015-12-24 DIAGNOSIS — C61 Malignant neoplasm of prostate: Secondary | ICD-10-CM | POA: Diagnosis not present

## 2015-12-24 DIAGNOSIS — C787 Secondary malignant neoplasm of liver and intrahepatic bile duct: Secondary | ICD-10-CM | POA: Diagnosis not present

## 2015-12-24 LAB — CBC WITH DIFFERENTIAL/PLATELET
BASO%: 1.1 % (ref 0.0–2.0)
Basophils Absolute: 0 10*3/uL (ref 0.0–0.1)
EOS%: 8.1 % — AB (ref 0.0–7.0)
Eosinophils Absolute: 0.2 10*3/uL (ref 0.0–0.5)
HCT: 34.3 % — ABNORMAL LOW (ref 38.4–49.9)
HEMOGLOBIN: 11 g/dL — AB (ref 13.0–17.1)
LYMPH%: 16.1 % (ref 14.0–49.0)
MCH: 27.9 pg (ref 27.2–33.4)
MCHC: 32.1 g/dL (ref 32.0–36.0)
MCV: 86.8 fL (ref 79.3–98.0)
MONO#: 0.2 10*3/uL (ref 0.1–0.9)
MONO%: 7.8 % (ref 0.0–14.0)
NEUT#: 2 10*3/uL (ref 1.5–6.5)
NEUT%: 66.9 % (ref 39.0–75.0)
Platelets: 227 10*3/uL (ref 140–400)
RBC: 3.95 10*6/uL — ABNORMAL LOW (ref 4.20–5.82)
RDW: 18.4 % — AB (ref 11.0–14.6)
WBC: 3 10*3/uL — ABNORMAL LOW (ref 4.0–10.3)
lymph#: 0.5 10*3/uL — ABNORMAL LOW (ref 0.9–3.3)

## 2015-12-24 LAB — COMPREHENSIVE METABOLIC PANEL
ALBUMIN: 3.2 g/dL — AB (ref 3.5–5.0)
ALK PHOS: 106 U/L (ref 40–150)
ALT: 10 U/L (ref 0–55)
ANION GAP: 5 meq/L (ref 3–11)
AST: 14 U/L (ref 5–34)
BILIRUBIN TOTAL: 0.58 mg/dL (ref 0.20–1.20)
BUN: 11.3 mg/dL (ref 7.0–26.0)
CALCIUM: 8.5 mg/dL (ref 8.4–10.4)
CO2: 26 mEq/L (ref 22–29)
CREATININE: 0.8 mg/dL (ref 0.7–1.3)
Chloride: 108 mEq/L (ref 98–109)
EGFR: >90 mL/min/{1.73_m2} (ref >90)
Glucose: 95 mg/dl (ref 70–140)
Potassium: 4.1 mEq/L (ref 3.5–5.1)
Sodium: 139 mEq/L (ref 136–145)
Total Protein: 6.2 g/dL — ABNORMAL LOW (ref 6.4–8.3)

## 2015-12-24 MED ORDER — SPIRONOLACTONE 50 MG PO TABS: 50.0000 mg | ORAL_TABLET | Freq: Every day | ORAL | Status: DC

## 2015-12-24 NOTE — Progress Notes (Signed)
Hematology and Oncology Follow Up Visit  [name] Johnston [id number] 21-Jan-1951 65 y.o. 12/24/2015 12:25 PM [name] Johnston, MDDewey, [name], MD   Principle Diagnosis: 65 year old gentleman with prostate cancer diagnosed in 2014. He presented with obstructive symptoms and found to have a Gleason score 4+4 = 8 prostate cancer with PSA of around 40.  Liver mass diagnosed in December 2015 with an elevated AFP of 9.3. Biopsy proven to be prostate cancer.  Prior Therapy:  He is started [place] on 06/27/2013. He subsequently received definitive radiation therapy for a planned dose of 75 gray completed in November 2014 under the care of Dr. Tammi Klippel.  His PSA subsequently noted close to 0.33. PSA was up to 11 on 10/29/2014 with a castrate levels of testosterone of 19.   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  Docetaxel 75 mg/m given every 3 weeks with Neulasta support. He is status post 10 cycles of treatment concluded on 11/21/2015. He had an excellent PSA response with PSA dropping from 222 to 0.17.   Current therapy: Treatment holiday and observation and surveillance.  Interim History:  [name] presents today for a follow-up visit with his family as well as interpreter. Since his last visit, he continues to report increased lower extremity swelling. Despite being on Aldactone, he reports increase in his lower extremity edema now up to the level of the thigh. He also increased abdominal fullness but no other complications. He has not reported any nausea, vomiting or early satiety. He continues to eat well and appetite is reasonable.   He does not report any dyspnea on exertion, chest pain or difficulty breathing. He has not reported any residual neuropathy or other complications related to chemotherapy.   He does not report any headaches or blurry vision or syncope. He does not report any fevers, chills, or sweats.  He does not report  any frequency urgency but does report dysuria and occasional hematuria. He does not report any skeletal complaints of back pain shoulder pain or hip pain. He does not report any lymphadenopathy or petechiae. Remainder of his review of systems unremarkable.   Medications: I have reviewed the patient's current medications.  Current Outpatient Prescriptions  Medication Sig Dispense Refill  . acetaminophen (TYLENOL) 325 MG tablet Take 650 mg by mouth every 6 (six) hours as needed.    . lidocaine-prilocaine (EMLA) cream Apply 1 application topically as needed. 30 g 0  . omeprazole (PRILOSEC) 20 MG capsule Take 20 mg by mouth daily.    Marland Kitchen oxyCODONE (OXY IR/ROXICODONE) 5 MG immediate release tablet Take 1 tablet (5 mg total) by mouth every 4 (four) hours as needed for severe pain. 30 tablet 0  . polyethylene glycol powder (MIRALAX) powder Mix 17 grams of powder with 8 ounces of liquid daily by mouth. 527 g 2  . prochlorperazine (COMPAZINE) 10 MG tablet Take 1 tablet (10 mg total) by mouth every 6 (six) hours as needed for nausea or vomiting. (Patient not taking: Reported on 11/21/2015) 30 tablet 0  . solifenacin (VESICARE) 10 MG tablet Take 1 tablet (10 mg total) by mouth daily. 30 tablet 6  . spironolactone (ALDACTONE) 50 MG tablet Take 1 tablet (50 mg total) by mouth daily. 40 tablet 1   No current facility-administered medications for this visit.     Allergies:  Allergies  Allergen Reactions  . Aspirin Hives    Past Medical History, Surgical history, Social history, and Family History were reviewed and updated.  Physical Exam: Blood pressure 129/57, pulse 81, temperature 98.2 F (36.8 C), temperature source Oral, resp. rate 18, height 5\' 1"  (1.549 m), weight 147 lb 4.8 oz (66.815 kg), SpO2 100 %. ECOG: 1 General appearance: alert and cooperative. Appeared without distress. Head: Normocephalic, without obvious abnormality. No oral thrush. Neck: no adenopathy no thyroid masses. Lymph  nodes: Cervical, supraclavicular, and axillary nodes normal. Heart:regular rate and rhythm, S1, S2 normal, no murmur, click, rub or gallop  Chest wall examination: Showed a Port-A-Cath to be without erythema or induration. Lung:chest clear, no wheezing, rales, normal symmetric air entry Abdomin: soft, non-tender, without masses or organomegaly. No shifting dullness or ascites. EXT:2+ edema noted to the level of the thigh. Skin: dry skin noted without any erythema or induration.    Lab Results: Lab Results  Component Value Date   WBC 3.0* 12/24/2015   HGB 11.0* 12/24/2015   HCT 34.3* 12/24/2015   MCV 86.8 12/24/2015   PLT 227 12/24/2015     Chemistry      Component Value Date/Time   NA 139 12/24/2015 1108   NA 138 04/15/2015 2219   K 4.1 12/24/2015 1108   K 4.5 04/15/2015 2219   CL 102 04/15/2015 2219   CO2 26 12/24/2015 1108   CO2 25 04/15/2015 2219   BUN 11.3 12/24/2015 1108   BUN 13 04/15/2015 2219   CREATININE 0.8 12/24/2015 1108   CREATININE 0.95 04/15/2015 2219      Component Value Date/Time   CALCIUM 8.5 12/24/2015 1108   CALCIUM 9.5 04/15/2015 2219   ALKPHOS 106 12/24/2015 1108   ALKPHOS 92 04/15/2015 2219   AST 14 12/24/2015 1108   AST 24 04/15/2015 2219   ALT 10 12/24/2015 1108   ALT 21 04/15/2015 2219   BILITOT 0.58 12/24/2015 1108   BILITOT 0.8 04/15/2015 2219          Results for [name], Johnston (MRN [id number]) as of 11/21/2015 11:45  Ref. Range 09/19/2015 12:32 10/10/2015 08:54 10/31/2015 09:06  PSA Latest Ref Range: <=4.00 ng/mL 1.05 0.39 0.17       Impression and Plan:  65 year old gentleman with the following issues:  1. Prostate cancer diagnosed in June 2014. He presented with obstructive symptoms and found to have a Gleason score 4+4 = 8 prostate cancer with PSA of around 40. He was treated with androgen deprivation and radiation therapy with an excellent initial response and a PSA nadir of 0.33. PSA was up to 19 and Casodex was added.  He  developed progression of disease with PSA up to 222 with liver metastasis.  He is status post Taxotere chemotherapy with an excellent response by PSA criteria as well as CT scan. His last CT done in November 2016 showed response to chemotherapy with decrease in his liver nodules. His PSA is down to 0.17.  The plan is to withhold treatment for the time being and restarted upon symptomatic progression. I feel he will benefit from a treatment holiday between 2-3 months.  2. Liver masses: biopsy-proven to be prostate cancer without any evidence of hepatocellular carcinoma. CT scan on 10/29/2015 showed response to therapy by close to 50%. We will continue to monitor this and repeat imaging studies in 3-6 months.  3.  IV access: Right anterior chest Port-A-Cath placed 05/07/2015.  Has not reported any complications. This will be need to be flushed with the next visit.   4. Lower extremity edema: Likely related to Taxotere. I have increased Aldactone to 50 mg daily and we  will consider different diuretic in addition if no response is noted.  5. Pain: Diffuse in nature and likely related to arthralgias and myalgias of chemotherapy. This seems to be improving at this time.  6. Prognosis: This was discussed with the patient and his family. They understand that this malignancy is incurable and chemotherapy is palliative at this time. He did have a very robust response with excellent improvement in his PSA and cancer status. However, I anticipate this tumor to progress later and will require restart chemotherapy. Eventually, this cancer will lead to his death and chemotherapy's goal is  to delay this as soon as possible.  7. Follow-up: Will be in 4 weeks to assess his clinical status.    Zola Button, MD 1/3/201712:25 PM

## 2015-12-24 NOTE — Telephone Encounter (Signed)
Talked to patient here in office. Scheduled appt.       [facility]. °

## 2015-12-25 LAB — PSA: PSA: 0.12 ng/mL (ref <=4.00)

## 2015-12-26 ENCOUNTER — Encounter: Payer: Self-pay | Admitting: *Deleted

## 2015-12-27 DIAGNOSIS — Z0000 Encounter for general adult medical examination without abnormal findings: Secondary | ICD-10-CM | POA: Diagnosis not present

## 2015-12-27 DIAGNOSIS — C61 Malignant neoplasm of prostate: Secondary | ICD-10-CM | POA: Diagnosis not present

## 2016-01-06 ENCOUNTER — Encounter: Payer: Self-pay | Admitting: Oncology

## 2016-01-06 ENCOUNTER — Other Ambulatory Visit: Payer: Self-pay | Admitting: Family Medicine

## 2016-01-06 ENCOUNTER — Other Ambulatory Visit: Payer: Self-pay | Admitting: Oncology

## 2016-01-06 DIAGNOSIS — R140 Abdominal distension (gaseous): Secondary | ICD-10-CM | POA: Diagnosis not present

## 2016-01-06 DIAGNOSIS — M779 Enthesopathy, unspecified: Secondary | ICD-10-CM | POA: Diagnosis not present

## 2016-01-06 DIAGNOSIS — R160 Hepatomegaly, not elsewhere classified: Secondary | ICD-10-CM | POA: Diagnosis not present

## 2016-01-06 DIAGNOSIS — C7800 Secondary malignant neoplasm of unspecified lung: Secondary | ICD-10-CM | POA: Diagnosis not present

## 2016-01-06 DIAGNOSIS — M79605 Pain in left leg: Secondary | ICD-10-CM

## 2016-01-07 ENCOUNTER — Other Ambulatory Visit: Payer: Self-pay | Admitting: *Deleted

## 2016-01-07 ENCOUNTER — Ambulatory Visit
Admission: RE | Admit: 2016-01-07 | Discharge: 2016-01-07 | Disposition: A | Discharge status: Home / Self Care | Payer: PPO | Source: Ambulatory Visit | Attending: Family Medicine | Admitting: Family Medicine

## 2016-01-07 DIAGNOSIS — M79605 Pain in left leg: Secondary | ICD-10-CM

## 2016-01-07 DIAGNOSIS — M7989 Other specified soft tissue disorders: Secondary | ICD-10-CM | POA: Diagnosis not present

## 2016-01-07 MED ORDER — OXYCODONE HCL 5 MG PO TABS: 5.0000 mg | ORAL_TABLET | ORAL | Status: DC | PRN

## 2016-01-07 NOTE — Telephone Encounter (Signed)
Son simon requested  pain medication on my chart. Unable to reach son simon. Called daughter. Script left at front for p/u. I.d. needed

## 2016-01-11 DIAGNOSIS — H04123 Dry eye syndrome of bilateral lacrimal glands: Secondary | ICD-10-CM | POA: Diagnosis not present

## 2016-01-11 DIAGNOSIS — H2513 Age-related nuclear cataract, bilateral: Secondary | ICD-10-CM | POA: Diagnosis not present

## 2016-01-11 DIAGNOSIS — H52223 Regular astigmatism, bilateral: Secondary | ICD-10-CM | POA: Diagnosis not present

## 2016-01-11 DIAGNOSIS — H524 Presbyopia: Secondary | ICD-10-CM | POA: Diagnosis not present

## 2016-01-11 DIAGNOSIS — H5203 Hypermetropia, bilateral: Secondary | ICD-10-CM | POA: Diagnosis not present

## 2016-01-27 DIAGNOSIS — M79605 Pain in left leg: Secondary | ICD-10-CM | POA: Diagnosis not present

## 2016-01-27 DIAGNOSIS — C61 Malignant neoplasm of prostate: Secondary | ICD-10-CM | POA: Diagnosis not present

## 2016-01-27 DIAGNOSIS — M779 Enthesopathy, unspecified: Secondary | ICD-10-CM | POA: Diagnosis not present

## 2016-01-27 DIAGNOSIS — M549 Dorsalgia, unspecified: Secondary | ICD-10-CM | POA: Diagnosis not present

## 2016-01-28 ENCOUNTER — Ambulatory Visit (HOSPITAL_BASED_OUTPATIENT_CLINIC_OR_DEPARTMENT_OTHER): Payer: PPO | Admitting: Oncology

## 2016-01-28 ENCOUNTER — Telehealth: Payer: Self-pay | Admitting: Oncology

## 2016-01-28 ENCOUNTER — Ambulatory Visit (HOSPITAL_BASED_OUTPATIENT_CLINIC_OR_DEPARTMENT_OTHER): Payer: PPO

## 2016-01-28 ENCOUNTER — Other Ambulatory Visit (HOSPITAL_BASED_OUTPATIENT_CLINIC_OR_DEPARTMENT_OTHER): Payer: PPO

## 2016-01-28 VITALS — BP 137/72 | HR 88 | Temp 98.3°F | Resp 18 | Wt 140.0 lb

## 2016-01-28 DIAGNOSIS — C787 Secondary malignant neoplasm of liver and intrahepatic bile duct: Secondary | ICD-10-CM

## 2016-01-28 DIAGNOSIS — R599 Enlarged lymph nodes, unspecified: Secondary | ICD-10-CM | POA: Diagnosis not present

## 2016-01-28 DIAGNOSIS — C61 Malignant neoplasm of prostate: Secondary | ICD-10-CM

## 2016-01-28 DIAGNOSIS — Z95828 Presence of other vascular implants and grafts: Secondary | ICD-10-CM

## 2016-01-28 LAB — CBC WITH DIFFERENTIAL/PLATELET
BASO%: 0.6 % (ref 0.0–2.0)
BASOS ABS: 0 10*3/uL (ref 0.0–0.1)
EOS ABS: 0.2 10*3/uL (ref 0.0–0.5)
EOS%: 5.9 % (ref 0.0–7.0)
HCT: 36.3 % — ABNORMAL LOW (ref 38.4–49.9)
HEMOGLOBIN: 11.7 g/dL — AB (ref 13.0–17.1)
LYMPH%: 15.2 % (ref 14.0–49.0)
MCH: 26.8 pg — AB (ref 27.2–33.4)
MCHC: 32.2 g/dL (ref 32.0–36.0)
MCV: 83.3 fL (ref 79.3–98.0)
MONO#: 0.3 10*3/uL (ref 0.1–0.9)
MONO%: 7.4 % (ref 0.0–14.0)
NEUT#: 2.9 10*3/uL (ref 1.5–6.5)
NEUT%: 70.9 % (ref 39.0–75.0)
Platelets: 186 10*3/uL (ref 140–400)
RBC: 4.36 10*6/uL (ref 4.20–5.82)
RDW: 15.9 % — ABNORMAL HIGH (ref 11.0–14.6)
WBC: 4.1 10*3/uL (ref 4.0–10.3)
lymph#: 0.6 10*3/uL — ABNORMAL LOW (ref 0.9–3.3)

## 2016-01-28 LAB — COMPREHENSIVE METABOLIC PANEL
ALBUMIN: 3.3 g/dL — AB (ref 3.5–5.0)
ALK PHOS: 149 U/L (ref 40–150)
ALT: 22 U/L (ref 0–55)
AST: 19 U/L (ref 5–34)
Anion Gap: 9 mEq/L (ref 3–11)
BUN: 12.4 mg/dL (ref 7.0–26.0)
CALCIUM: 8.7 mg/dL (ref 8.4–10.4)
CHLORIDE: 106 meq/L (ref 98–109)
CO2: 27 mEq/L (ref 22–29)
Creatinine: 0.8 mg/dL (ref 0.7–1.3)
GLUCOSE: 112 mg/dL (ref 70–140)
POTASSIUM: 3.9 meq/L (ref 3.5–5.1)
SODIUM: 142 meq/L (ref 136–145)
Total Bilirubin: 0.46 mg/dL (ref 0.20–1.20)
Total Protein: 6.7 g/dL (ref 6.4–8.3)

## 2016-01-28 MED ORDER — HEPARIN SOD (PORK) LOCK FLUSH 100 UNIT/ML IV SOLN
500.0000 [IU] | Freq: Once | INTRAVENOUS | Status: AC
  Administered 2016-01-28: 500 [IU] via INTRAVENOUS
  Filled 2016-01-28: qty 5

## 2016-01-28 MED ORDER — SODIUM CHLORIDE 0.9% FLUSH
10.0000 mL | INTRAVENOUS | Status: DC | PRN
  Administered 2016-01-28: 10 mL via INTRAVENOUS
  Filled 2016-01-28: qty 10

## 2016-01-28 NOTE — Patient Instructions (Signed)

## 2016-01-28 NOTE — Progress Notes (Signed)
Hematology and Oncology Follow Up Visit  Ladre Sines [id number] Sep 24, 1951 65 y.o. 01/28/2016 2:44 PM [name],[name], MDDewey, [name], MD   Principle Diagnosis: 65 year old gentleman with prostate cancer diagnosed in 2014. He presented with obstructive symptoms and found to have a Gleason score 4+4 = 8 prostate cancer with PSA of around 40.  Liver mass diagnosed in December 2015 with an elevated AFP of 9.3. Biopsy proven to be prostate cancer.  Prior Therapy:  He is started [place] on 06/27/2013. He subsequently received definitive radiation therapy for a planned dose of 75 gray completed in November 2014 under the care of Dr. Tammi Klippel.  His PSA subsequently noted close to 0.33. PSA was up to 11 on 10/29/2014 with a castrate levels of testosterone of 19.   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  Docetaxel 75 mg/m given every 3 weeks with Neulasta support. He is status post 10 cycles of treatment concluded on 11/21/2015. He had an excellent PSA response with PSA dropping from 222 to 0.17.   Current therapy: Treatment holiday and observation and surveillance.  Interim History:  Mr. Bar presents today for a follow-up visit with his daughter and an interpreter. Since his last visit, he continues to report slight improvement of chemotherapy. His lower extremity edema is improving slowly. He continues on Aldactone and have tolerated it reasonably well. His appetite is improving and have not lost any weight.He does not report any dyspnea on exertion, chest pain or difficulty breathing. He has not reported any residual neuropathy or other complications related to chemotherapy.  He does report leg pain with ambulation and does report using a cane at times. Has not reported any falls or syncope. He does report some night sweats at times but have been chronic since his been on Lupron therapy.   He does not report any headaches or blurry  vision or syncope. He does not report any fevers or chills.  He does not report any frequency urgency but does report dysuria and occasional hematuria. He does not report any skeletal complaints of back pain shoulder pain or hip pain. He does not report any lymphadenopathy or petechiae. Remainder of his review of systems unremarkable.   Medications: I have reviewed the patient's current medications.  Current Outpatient Prescriptions  Medication Sig Dispense Refill  . oxyCODONE (OXY IR/ROXICODONE) 5 MG immediate release tablet Take 1 tablet (5 mg total) by mouth every 4 (four) hours as needed for severe pain. 30 tablet 0  . spironolactone (ALDACTONE) 50 MG tablet Take 1 tablet (50 mg total) by mouth daily. 40 tablet 1  . acetaminophen (TYLENOL) 325 MG tablet Take 650 mg by mouth every 6 (six) hours as needed. Reported on 01/28/2016    . lidocaine-prilocaine (EMLA) cream Apply 1 application topically as needed. (Patient not taking: Reported on 01/28/2016) 30 g 0  . omeprazole (PRILOSEC) 20 MG capsule Take 20 mg by mouth daily. Reported on 01/28/2016    . polyethylene glycol powder (MIRALAX) powder Mix 17 grams of powder with 8 ounces of liquid daily by mouth. (Patient not taking: Reported on 01/28/2016) 527 g 2  . prochlorperazine (COMPAZINE) 10 MG tablet Take 1 tablet (10 mg total) by mouth every 6 (six) hours as needed for nausea or vomiting. (Patient not taking: Reported on 11/21/2015) 30 tablet 0  . solifenacin (VESICARE) 10 MG tablet Take 1 tablet (10 mg total) by mouth daily. (Patient not taking: Reported on 01/28/2016) 30 tablet 6   No  current facility-administered medications for this visit.     Allergies:  Allergies  Allergen Reactions  . Aspirin Hives    Past Medical History, Surgical history, Social history, and Family History were reviewed and updated.    Physical Exam: Blood pressure 137/72, pulse 88, temperature 98.3 F (36.8 C), temperature source Oral, resp. rate 18, weight 140 lb  (63.504 kg), SpO2 99 %. ECOG: 1 General appearance: alert and cooperative. well-appearing without distress. Head: Normocephalic, without obvious abnormality. No oral ulcers or lesions.  Neck: no adenopathy no thyroid masses. Lymph nodes: Cervical, supraclavicular, and axillary nodes normal. Heart:regular rate and rhythm, S1, S2 normal, no murmur, click, rub or gallop  Chest wall examination: Showed a Port-A-Cath to be without erythema or induration. Lung:chest clear, no wheezing, rales, normal symmetric air entry Abdomin: soft, non-tender, without masses or organomegaly. No shifting dullness or ascites. EXT:1+ edema noted to the level of the thigh. Skin: dry skin noted without any erythema or induration.    Lab Results: Lab Results  Component Value Date   WBC 4.1 01/28/2016   HGB 11.7* 01/28/2016   HCT 36.3* 01/28/2016   MCV 83.3 01/28/2016   PLT 186 01/28/2016     Chemistry      Component Value Date/Time   NA 139 12/24/2015 1108   NA 138 04/15/2015 2219   K 4.1 12/24/2015 1108   K 4.5 04/15/2015 2219   CL 102 04/15/2015 2219   CO2 26 12/24/2015 1108   CO2 25 04/15/2015 2219   BUN 11.3 12/24/2015 1108   BUN 13 04/15/2015 2219   CREATININE 0.8 12/24/2015 1108   CREATININE 0.95 04/15/2015 2219      Component Value Date/Time   CALCIUM 8.5 12/24/2015 1108   CALCIUM 9.5 04/15/2015 2219   ALKPHOS 106 12/24/2015 1108   ALKPHOS 92 04/15/2015 2219   AST 14 12/24/2015 1108   AST 24 04/15/2015 2219   ALT 10 12/24/2015 1108   ALT 21 04/15/2015 2219   BILITOT 0.58 12/24/2015 1108   BILITOT 0.8 04/15/2015 2219        Results for JACY, KRUKOWSKI (MRN [id number]) as of 01/28/2016 14:16  Ref. Range 10/31/2015 09:06 11/21/2015 11:28 12/24/2015 11:08  PSA Latest Ref Range: <=4.00 ng/mL 0.17 0.17 0.12          Impression and Plan:  65 year old gentleman with the following issues:  1. Prostate cancer diagnosed in June 2014. He presented with obstructive symptoms and found to have  a Gleason score 4+4 = 8 prostate cancer with PSA of around 40. He was treated with androgen deprivation and radiation therapy with an excellent initial response and a PSA nadir of 0.33. PSA was up to 19 and Casodex was added.  He developed progression of disease with PSA up to 222 with liver metastasis.  He is status post Taxotere chemotherapy with an excellent response by PSA criteria as well as CT scan. His last CT done in November 2016 showed response to chemotherapy with decrease in his liver nodules.   His most recent PSA continues to decline to 0.12. The plan is to continue with observation and surveillance and restart systemic therapy upon progression of disease. We will repeat imaging studies in April 2017.   2. Liver masses: biopsy-proven to be prostate cancer without any evidence of hepatocellular carcinoma. CT scan on 10/29/2015 showed response to therapy by close to 50%. We will continue to monitor this and repeat imaging studies in April 2017  3.  IV access: Right anterior chest  Port-A-Cath placed 05/07/2015.  Has not reported any complications. This will be need to be flushed with each visit.    4. Lower extremity edema: Likely related to Taxotere. Continues to be on Aldactone with some improvement.   5. Pain: Diffuse in nature and likely related to arthralgias and myalgias of chemotherapy. His pain is manageable at this time.   6. Prognosis:  he had an excellent response to chemotherapy which improves his prognosis but continues to emphasize that chemotherapy in the future will be palliative. Cure is unlikely in this scenario.   7. Follow-up: Will be in 4 weeks to assess his clinical status.    Zola Button, MD 2/7/20172:44 PM

## 2016-01-28 NOTE — Telephone Encounter (Signed)
per pof to sch pt appt-gave pt copy of avs °

## 2016-01-29 LAB — PSA: PROSTATE SPECIFIC AG, SERUM: 0.2 ng/mL (ref 0.0–4.0)

## 2016-01-29 LAB — PSA (PARALLEL TESTING): PSA: 0.18 ng/mL (ref <=4.00)

## 2016-02-27 ENCOUNTER — Ambulatory Visit (HOSPITAL_BASED_OUTPATIENT_CLINIC_OR_DEPARTMENT_OTHER): Payer: PPO

## 2016-02-27 ENCOUNTER — Other Ambulatory Visit (HOSPITAL_BASED_OUTPATIENT_CLINIC_OR_DEPARTMENT_OTHER): Payer: PPO

## 2016-02-27 ENCOUNTER — Ambulatory Visit (HOSPITAL_BASED_OUTPATIENT_CLINIC_OR_DEPARTMENT_OTHER): Payer: PPO | Admitting: Oncology

## 2016-02-27 ENCOUNTER — Telehealth: Payer: Self-pay | Admitting: Oncology

## 2016-02-27 VITALS — BP 132/74 | HR 77 | Temp 97.7°F | Resp 18 | Wt 135.6 lb

## 2016-02-27 DIAGNOSIS — C787 Secondary malignant neoplasm of liver and intrahepatic bile duct: Secondary | ICD-10-CM

## 2016-02-27 DIAGNOSIS — C61 Malignant neoplasm of prostate: Secondary | ICD-10-CM

## 2016-02-27 DIAGNOSIS — R600 Localized edema: Secondary | ICD-10-CM

## 2016-02-27 DIAGNOSIS — Z95828 Presence of other vascular implants and grafts: Secondary | ICD-10-CM

## 2016-02-27 LAB — CBC WITH DIFFERENTIAL/PLATELET
BASO%: 0.9 % (ref 0.0–2.0)
BASOS ABS: 0 10*3/uL (ref 0.0–0.1)
EOS ABS: 0.4 10*3/uL (ref 0.0–0.5)
EOS%: 10.4 % — ABNORMAL HIGH (ref 0.0–7.0)
HCT: 38.1 % — ABNORMAL LOW (ref 38.4–49.9)
HEMOGLOBIN: 12.4 g/dL — AB (ref 13.0–17.1)
LYMPH%: 20.8 % (ref 14.0–49.0)
MCH: 26.3 pg — AB (ref 27.2–33.4)
MCHC: 32.5 g/dL (ref 32.0–36.0)
MCV: 81 fL (ref 79.3–98.0)
MONO#: 0.3 10*3/uL (ref 0.1–0.9)
MONO%: 8.9 % (ref 0.0–14.0)
NEUT#: 2 10*3/uL (ref 1.5–6.5)
NEUT%: 59 % (ref 39.0–75.0)
PLATELETS: 178 10*3/uL (ref 140–400)
RBC: 4.71 10*6/uL (ref 4.20–5.82)
RDW: 15 % — AB (ref 11.0–14.6)
WBC: 3.4 10*3/uL — ABNORMAL LOW (ref 4.0–10.3)
lymph#: 0.7 10*3/uL — ABNORMAL LOW (ref 0.9–3.3)

## 2016-02-27 LAB — COMPREHENSIVE METABOLIC PANEL
ALBUMIN: 3.6 g/dL (ref 3.5–5.0)
ALK PHOS: 162 U/L — AB (ref 40–150)
ALT: 17 U/L (ref 0–55)
ANION GAP: 8 meq/L (ref 3–11)
AST: 17 U/L (ref 5–34)
BILIRUBIN TOTAL: 0.5 mg/dL (ref 0.20–1.20)
BUN: 12.7 mg/dL (ref 7.0–26.0)
CALCIUM: 8.9 mg/dL (ref 8.4–10.4)
CO2: 27 mEq/L (ref 22–29)
Chloride: 104 mEq/L (ref 98–109)
Creatinine: 0.8 mg/dL (ref 0.7–1.3)
GLUCOSE: 101 mg/dL (ref 70–140)
POTASSIUM: 3.9 meq/L (ref 3.5–5.1)
SODIUM: 139 meq/L (ref 136–145)
TOTAL PROTEIN: 7 g/dL (ref 6.4–8.3)

## 2016-02-27 MED ORDER — SODIUM CHLORIDE 0.9% FLUSH
10.0000 mL | INTRAVENOUS | Status: DC | PRN
  Administered 2016-02-27: 10 mL via INTRAVENOUS
  Filled 2016-02-27: qty 10

## 2016-02-27 MED ORDER — HEPARIN SOD (PORK) LOCK FLUSH 100 UNIT/ML IV SOLN
500.0000 [IU] | Freq: Once | INTRAVENOUS | Status: AC
  Administered 2016-02-27: 500 [IU] via INTRAVENOUS
  Filled 2016-02-27: qty 5

## 2016-02-27 NOTE — Telephone Encounter (Signed)
Gave and printed appt sched and avs fo rpt for April...gv barium °

## 2016-02-27 NOTE — Patient Instructions (Signed)

## 2016-02-27 NOTE — Progress Notes (Signed)
Hematology and Oncology Follow Up Visit  [name] Johnston [id number] 03/06/1951 65 y.o. 02/27/2016 2:30 PM [name],[name], MDDewey, [name], MD   Principle Diagnosis: 65 year old gentleman with prostate cancer diagnosed in 2014. He presented with obstructive symptoms and found to have a Gleason score 4+4 = 8 prostate cancer with PSA of around 40.  Liver mass diagnosed in December 2015 with an elevated AFP of 9.3. Biopsy proven to be prostate cancer.  Prior Therapy:  He is started [place] on 06/27/2013. He subsequently received definitive radiation therapy for a planned dose of 75 gray completed in November 2014 under the care of Dr. Tammi Klippel.  His PSA subsequently noted close to 0.33. PSA was up to 11 on 10/29/2014 with a castrate levels of testosterone of 19.   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  Docetaxel 75 mg/m given every 3 weeks with Neulasta support. He is status post 10 cycles of treatment concluded on 11/21/2015. He had an excellent PSA response with PSA dropping from 222 to 0.17.   Current therapy: Treatment holiday and observation and surveillance.  Interim History:  [name] Johnston presents today for a follow-up visit with his daughter who provided the interpretation. Since his last visit, he reports feeling reasonably well. He has reported some symptoms of dizziness and lightheadedness at times. He had near falls but did not sustain any falls or injuries. His low actually edema have resolved. His appetite is improving and have not lost any weight. He has not reported any residual neuropathy or other complications related to chemotherapy.  He is continued to exercise regularly including walking on a regular basis. His performance status is improving and his quality of life and have improved after chemotherapy.   He does not report any headaches or blurry vision or syncope. He does not report any fevers or chills.  He does  not report any frequency urgency but does report dysuria and occasional hematuria. He does not report any skeletal complaints of back pain shoulder pain or hip pain. He does not report any lymphadenopathy or petechiae. Remainder of his review of systems unremarkable.   Medications: I have reviewed the patient's current medications.  Current Outpatient Prescriptions  Medication Sig Dispense Refill  . acetaminophen (TYLENOL) 325 MG tablet Take 650 mg by mouth every 6 (six) hours as needed. Reported on 01/28/2016    . lidocaine-prilocaine (EMLA) cream Apply 1 application topically as needed. 30 g 0  . omeprazole (PRILOSEC) 20 MG capsule Take 20 mg by mouth daily. Reported on 01/28/2016    . oxyCODONE (OXY IR/ROXICODONE) 5 MG immediate release tablet Take 1 tablet (5 mg total) by mouth every 4 (four) hours as needed for severe pain. 30 tablet 0  . polyethylene glycol powder (MIRALAX) powder Mix 17 grams of powder with 8 ounces of liquid daily by mouth. 527 g 2  . prochlorperazine (COMPAZINE) 10 MG tablet Take 1 tablet (10 mg total) by mouth every 6 (six) hours as needed for nausea or vomiting. 30 tablet 0  . solifenacin (VESICARE) 10 MG tablet Take 1 tablet (10 mg total) by mouth daily. 30 tablet 6  . spironolactone (ALDACTONE) 25 MG tablet Take 1 tablet by mouth daily.     No current facility-administered medications for this visit.     Allergies:  Allergies  Allergen Reactions  . Aspirin Hives    Past Medical History, Surgical history, Social history, and Family History were reviewed and updated.    Physical Exam: Blood  pressure 132/74, pulse 77, temperature 97.7 F (36.5 C), temperature source Oral, resp. rate 18, weight 135 lb 9.6 oz (61.508 kg), SpO2 100 %. ECOG: 1 General appearance: alert and cooperative. Not in any distress. Head: Normocephalic, without obvious abnormality. No oral ulcers or lesions. Neck: no adenopathy no thyroid masses. Lymph nodes: Cervical, supraclavicular, and  axillary nodes normal. Heart:regular rate and rhythm, S1, S2 normal, no murmur, click, rub or gallop  Chest wall examination: Port-A-Cath in place without erythema or induration. Lung:chest clear, no wheezing, rales, normal symmetric air entry Abdomin: soft, non-tender, without masses or organomegaly. No ascites. EXT: Trace edema noted. Skin: dry skin noted without any erythema or induration.    Lab Results: Lab Results  Component Value Date   WBC 3.4* 02/27/2016   HGB 12.4* 02/27/2016   HCT 38.1* 02/27/2016   MCV 81.0 02/27/2016   PLT 178 02/27/2016     Chemistry      Component Value Date/Time   NA 142 01/28/2016 1355   NA 138 04/15/2015 2219   K 3.9 01/28/2016 1355   K 4.5 04/15/2015 2219   CL 102 04/15/2015 2219   CO2 27 01/28/2016 1355   CO2 25 04/15/2015 2219   BUN 12.4 01/28/2016 1355   BUN 13 04/15/2015 2219   CREATININE 0.8 01/28/2016 1355   CREATININE 0.95 04/15/2015 2219      Component Value Date/Time   CALCIUM 8.7 01/28/2016 1355   CALCIUM 9.5 04/15/2015 2219   ALKPHOS 149 01/28/2016 1355   ALKPHOS 92 04/15/2015 2219   AST 19 01/28/2016 1355   AST 24 04/15/2015 2219   ALT 22 01/28/2016 1355   ALT 21 04/15/2015 2219   BILITOT 0.46 01/28/2016 1355   BILITOT 0.8 04/15/2015 2219        Results for [name] Johnston, [name] Johnston (MRN [id number]) as of 02/27/2016 14:14  Ref. Range 12/24/2015 11:08 01/28/2016 13:55 01/28/2016 13:55  PSA Latest Ref Range: 0.0-4.0 ng/mL 0.12 0.18 0.2     Impression and Plan:  65 year old gentleman with the following issues:  1. Prostate cancer diagnosed in June 2014. He presented with obstructive symptoms and found to have a Gleason score 4+4 = 8 prostate cancer with PSA of around 40. He was treated with androgen deprivation and radiation therapy with an excellent initial response and a PSA nadir of 0.33. PSA was up to 19 and Casodex was added.  He developed progression of disease with PSA up to 222 with liver metastasis.  He is status post  Taxotere chemotherapy with an excellent response by PSA criteria as well as CT scan. His last CT done in November 2016 showed response to chemotherapy with decrease in his liver nodules.   His most recent PSA showed slight increase of to 0.20. The plan is to continue active surveillance for the time being and repeat imaging studies as well as a CT scan in 6 weeks. He continues to have stable disease, we'll continue observation. Difference of his therapy will be used upon symptomatic progression.  2. Liver masses: biopsy-proven to be prostate cancer without any evidence of hepatocellular carcinoma. CT scan on 10/29/2015 showed response to therapy by close to 50%. CT scan will be repeated in April 2017.  3.  IV access: Right anterior chest Port-A-Cath placed 05/07/2015. This will be flushed periodically.   4. Lower extremity edema: Likely related to Taxotere. His lower extremity edema have improved and I instructed him to stop all back down given his symptoms of dizziness and mild dehydration.  5.  Pain: Diffuse in nature and likely related to arthralgias and myalgias of chemotherapy. His pain is manageable at this time.   6. Prognosis:  he had an excellent response to chemotherapy but likely would require different salvage therapy in the future. His disease is incurable however he has been able to be palliated effectively.  7. Follow-up: Will be in 6 weeks to assess his clinical status.    [id number], MD 3/9/20172:30 PM

## 2016-02-28 LAB — PSA (PARALLEL TESTING): PSA: 0.37 ng/mL (ref <=4.00)

## 2016-02-28 LAB — PSA: Prostate Specific Ag, Serum: 0.4 ng/mL (ref 0.0–4.0)

## 2016-04-06 ENCOUNTER — Other Ambulatory Visit: Payer: Self-pay | Admitting: *Deleted

## 2016-04-06 DIAGNOSIS — C61 Malignant neoplasm of prostate: Secondary | ICD-10-CM

## 2016-04-07 ENCOUNTER — Ambulatory Visit (HOSPITAL_COMMUNITY)
Admission: RE | Admit: 2016-04-07 | Discharge: 2016-04-07 | Disposition: A | Discharge status: Home / Self Care | Payer: PPO | Source: Ambulatory Visit | Attending: Oncology | Admitting: Oncology

## 2016-04-07 ENCOUNTER — Other Ambulatory Visit (HOSPITAL_BASED_OUTPATIENT_CLINIC_OR_DEPARTMENT_OTHER): Payer: PPO

## 2016-04-07 ENCOUNTER — Encounter (HOSPITAL_COMMUNITY): Payer: Self-pay

## 2016-04-07 ENCOUNTER — Ambulatory Visit (HOSPITAL_BASED_OUTPATIENT_CLINIC_OR_DEPARTMENT_OTHER): Payer: PPO

## 2016-04-07 VITALS — BP 152/91 | HR 77 | Temp 97.7°F | Resp 16

## 2016-04-07 DIAGNOSIS — J90 Pleural effusion, not elsewhere classified: Secondary | ICD-10-CM | POA: Diagnosis not present

## 2016-04-07 DIAGNOSIS — C787 Secondary malignant neoplasm of liver and intrahepatic bile duct: Secondary | ICD-10-CM | POA: Insufficient documentation

## 2016-04-07 DIAGNOSIS — C7801 Secondary malignant neoplasm of right lung: Secondary | ICD-10-CM | POA: Diagnosis not present

## 2016-04-07 DIAGNOSIS — C61 Malignant neoplasm of prostate: Secondary | ICD-10-CM | POA: Diagnosis not present

## 2016-04-07 LAB — COMPREHENSIVE METABOLIC PANEL
ALBUMIN: 3.7 g/dL (ref 3.5–5.0)
ALK PHOS: 129 U/L (ref 40–150)
ALT: 19 U/L (ref 0–55)
ANION GAP: 9 meq/L (ref 3–11)
AST: 19 U/L (ref 5–34)
BUN: 11.9 mg/dL (ref 7.0–26.0)
CALCIUM: 9.8 mg/dL (ref 8.4–10.4)
CO2: 26 mEq/L (ref 22–29)
Chloride: 106 mEq/L (ref 98–109)
Creatinine: 0.7 mg/dL (ref 0.7–1.3)
Glucose: 99 mg/dl (ref 70–140)
POTASSIUM: 4 meq/L (ref 3.5–5.1)
Sodium: 140 mEq/L (ref 136–145)
Total Bilirubin: 0.55 mg/dL (ref 0.20–1.20)
Total Protein: 7.6 g/dL (ref 6.4–8.3)

## 2016-04-07 LAB — CBC WITH DIFFERENTIAL/PLATELET
BASO%: 0.8 % (ref 0.0–2.0)
BASOS ABS: 0 10*3/uL (ref 0.0–0.1)
EOS ABS: 0.2 10*3/uL (ref 0.0–0.5)
EOS%: 5 % (ref 0.0–7.0)
HEMATOCRIT: 38.6 % (ref 38.4–49.9)
HEMOGLOBIN: 12.7 g/dL — AB (ref 13.0–17.1)
LYMPH#: 0.8 10*3/uL — AB (ref 0.9–3.3)
LYMPH%: 24.8 % (ref 14.0–49.0)
MCH: 26.3 pg — AB (ref 27.2–33.4)
MCHC: 32.9 g/dL (ref 32.0–36.0)
MCV: 80.1 fL (ref 79.3–98.0)
MONO#: 0.2 10*3/uL (ref 0.1–0.9)
MONO%: 7.1 % (ref 0.0–14.0)
NEUT#: 2.1 10*3/uL (ref 1.5–6.5)
NEUT%: 62.3 % (ref 39.0–75.0)
PLATELETS: 188 10*3/uL (ref 140–400)
RBC: 4.82 10*6/uL (ref 4.20–5.82)
RDW: 14.8 % — AB (ref 11.0–14.6)
WBC: 3.4 10*3/uL — ABNORMAL LOW (ref 4.0–10.3)

## 2016-04-07 MED ORDER — SODIUM CHLORIDE 0.9 % IJ SOLN
10.0000 mL | Freq: Once | INTRAMUSCULAR | Status: AC
  Administered 2016-04-07: 10 mL
  Filled 2016-04-07: qty 10

## 2016-04-07 MED ORDER — IOPAMIDOL (ISOVUE-300) INJECTION 61%
100.0000 mL | Freq: Once | INTRAVENOUS | Status: AC | PRN
  Administered 2016-04-07: 100 mL via INTRAVENOUS

## 2016-04-08 LAB — PSA: Prostate Specific Ag, Serum: 1.1 ng/mL (ref 0.0–4.0)

## 2016-04-09 ENCOUNTER — Telehealth: Payer: Self-pay | Admitting: Oncology

## 2016-04-09 ENCOUNTER — Ambulatory Visit (HOSPITAL_BASED_OUTPATIENT_CLINIC_OR_DEPARTMENT_OTHER): Payer: PPO | Admitting: Oncology

## 2016-04-09 ENCOUNTER — Encounter: Payer: Self-pay | Admitting: Gastroenterology

## 2016-04-09 VITALS — BP 123/62 | HR 77 | Temp 98.1°F | Resp 18 | Ht 61.0 in | Wt 141.2 lb

## 2016-04-09 DIAGNOSIS — R109 Unspecified abdominal pain: Secondary | ICD-10-CM | POA: Diagnosis not present

## 2016-04-09 DIAGNOSIS — C61 Malignant neoplasm of prostate: Secondary | ICD-10-CM

## 2016-04-09 DIAGNOSIS — R300 Dysuria: Secondary | ICD-10-CM | POA: Diagnosis not present

## 2016-04-09 DIAGNOSIS — C787 Secondary malignant neoplasm of liver and intrahepatic bile duct: Secondary | ICD-10-CM

## 2016-04-09 NOTE — Telephone Encounter (Signed)
Gave adn printed appt sched and avs for pt for June  °

## 2016-04-09 NOTE — Progress Notes (Signed)
Hematology and Oncology Follow Up Visit  [name] Johnston [id number] October 27, 1951 65 y.o. 04/09/2016 12:28 PM Johnston,[name], MDDewey, [name], MD   Principle Diagnosis: 65 year old gentleman with prostate cancer diagnosed in 2014. He presented with obstructive symptoms and found to have a Gleason score 4+4 = 8 prostate cancer with PSA of around 40.  Liver mass diagnosed in December 2015 with an elevated AFP of 9.3. Biopsy proven to be prostate cancer.  Prior Therapy:  He is started [place] on 06/27/2013. He subsequently received definitive radiation therapy for a planned dose of 75 gray completed in November 2014 under the care of Dr. Tammi Klippel.  His PSA subsequently noted close to 0.33. PSA was up to 11 on 10/29/2014 with a castrate levels of testosterone of 19.   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  Docetaxel 75 mg/m given every 3 weeks with Neulasta support. He is status post 10 cycles of treatment concluded on 11/21/2015. He had an excellent PSA response with PSA dropping from 222 to 0.17.   Current therapy: Treatment holiday and observation and surveillance.  Interim History:  [name] presents today for a follow-up visit with his family and an interpreter. Since his last visit, he reports no major changes in his health. He has reported symptoms of bloating, early satiety as well as abdominal pain after eating. Despite these symptoms, he still able to eat periodically and have gained weight. He denied any nausea or vomiting. He does report alteration in his bowel habits with diarrhea and constipation. He denied any delayed complications related to chemotherapy. He denied peripheral neuropathy and his lower extremity edema is improving and nearly resolved. His performance status is improving and his quality of life and have improved after chemotherapy.   He does not report any headaches or blurry vision or syncope. He does not  report any fevers or chills.  He does not report any frequency urgency but does report dysuria and occasional hematuria. He does not report any skeletal complaints of back pain shoulder pain or hip pain. He does not report any lymphadenopathy or petechiae. Remainder of his review of systems unremarkable.   Medications: I have reviewed the patient's current medications.  Current Outpatient Prescriptions  Medication Sig Dispense Refill  . lidocaine-prilocaine (EMLA) cream Apply 1 application topically as needed. 30 g 0   No current facility-administered medications for this visit.     Allergies:  Allergies  Allergen Reactions  . Aspirin Hives    Past Medical History, Surgical history, Social history, and Family History were reviewed and updated.    Physical Exam: Blood pressure 123/62, pulse 77, temperature 98.1 F (36.7 C), temperature source Oral, resp. rate 18, height 5\' 1"  (1.549 m), weight 141 lb 3.2 oz (64.048 kg), SpO2 99 %. ECOG: 1 General appearance: Alert, awake gentleman without distress. Head: Normocephalic, without obvious abnormality. No oral thrush noted. Neck: no adenopathy no thyroid masses. Lymph nodes: Cervical, supraclavicular, and axillary nodes normal. Heart:regular rate and rhythm, S1, S2 normal, no murmur, click, rub or gallop  Chest wall examination: Port-A-Cath site appeared without erythema or induration. Lung:chest clear, no wheezing, rales, normal symmetric air entry Abdomin: soft, non-tender, without masses or organomegaly. No ascites. No rebound or guarding. EXT: Trace edema noted. Skin: dry skin noted without any erythema or induration.    Lab Results: Lab Results  Component Value Date   WBC 3.4* 04/07/2016   HGB 12.7* 04/07/2016   HCT 38.6 04/07/2016   MCV 80.1  04/07/2016   PLT 188 04/07/2016     Chemistry      Component Value Date/Time   NA 140 04/07/2016 1251   NA 138 04/15/2015 2219   K 4.0 04/07/2016 1251   K 4.5 04/15/2015 2219    CL 102 04/15/2015 2219   CO2 26 04/07/2016 1251   CO2 25 04/15/2015 2219   BUN 11.9 04/07/2016 1251   BUN 13 04/15/2015 2219   CREATININE 0.7 04/07/2016 1251   CREATININE 0.95 04/15/2015 2219      Component Value Date/Time   CALCIUM 9.8 04/07/2016 1251   CALCIUM 9.5 04/15/2015 2219   ALKPHOS 129 04/07/2016 1251   ALKPHOS 92 04/15/2015 2219   AST 19 04/07/2016 1251   AST 24 04/15/2015 2219   ALT 19 04/07/2016 1251   ALT 21 04/15/2015 2219   BILITOT 0.55 04/07/2016 1251   BILITOT 0.8 04/15/2015 2219     Results for ETHEN, GIRTY (MRN [id number]) as of 04/09/2016 12:04  Ref. Range 04/07/2016 12:51  PSA Latest Ref Range: 0.0-4.0 ng/mL 1.1    EXAM: CT ABDOMEN AND PELVIS WITH CONTRAST  TECHNIQUE: Multidetector CT imaging of the abdomen and pelvis was performed using the standard protocol following bolus administration of intravenous contrast.  CONTRAST: 159mL ISOVUE-300 IOPAMIDOL (ISOVUE-300) INJECTION 61%  COMPARISON: 10/29/2015  FINDINGS: Lower chest: Increased size of pulmonary metastasis seen in the right middle lobe, currently measuring 11 mm on image 1 compared to 5 mm previously. Tiny bilateral pleural effusions show no significant change.  Hepatobiliary: Hypovascular mass in segment 4 a of the left hepatic lobe has decreased in size, currently measuring 2.3 x 1.3 cm on image 14/series 2 compared to 2.5 x 3.4 cm previously. Another hypovascular metastasis in segment 7 of the right hepatic lobe has also decreased in size, currently measuring 1.4 x 2.5 cm compared to 3.2 x 3.9 cm previously. No new liver masses are identified. Gallbladder is unremarkable.  Pancreas: No mass, inflammatory changes, or other significant abnormality.  Spleen: Within normal limits in size and appearance.  Adrenals/Urinary Tract: No masses identified. Tiny subcapsular left renal cyst remains stable. No evidence of hydronephrosis. 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  Stomach/Bowel: No evidence of obstruction, inflammatory process, or abnormal fluid collections.  Vascular/Lymphatic: No pathologically enlarged lymph nodes. No evidence of abdominal aortic aneurysm.  Reproductive: Brachytherapy seeds seen within the prostate bed. No definite masses identified.  Other: None.  Musculoskeletal: No suspicious bone lesions identified.  IMPRESSION: Decreased size of hepatic metastases since previous study. No new or progressive metastatic disease identified within the abdomen or pelvis.  Increased size of right middle lobe pulmonary metastasis. Stable tiny bilateral pleural effusions.      Impression and Plan:  65 year old gentleman with the following issues:  1. Prostate cancer diagnosed in June 2014. He presented with obstructive symptoms and found to have a Gleason score 4+4 = 8 prostate cancer with PSA of around 40. He was treated with androgen deprivation and radiation therapy with an excellent initial response and a PSA nadir of 0.33. PSA was up to 19 and Casodex was added.  He developed progression of disease with PSA up to 222 with liver metastasis.  He is status post Taxotere chemotherapy with an excellent response by PSA criteria as well as CT scan.   CT scan done on 04/07/2016 was reviewed today and continues to show increase in his hepatic metastasis. No new lesions noted. His right middle lobe  pulmonary nodule slightly increased to 11 mm.  His most recent PSA showed slight increase of to 1.1. Given the results of his CT scan and his PSA continues to be under control, elected to hold off on any treatment for the time being. Salvage therapy will be used upon symptomatic progression.  2. Liver masses: biopsy-proven to be prostate cancer without any evidence of hepatocellular carcinoma. CT scan on 04/07/2016 showed continued response and decrease in the size.  3.  IV access: Right  anterior chest Port-A-Cath placed 05/07/2015. This will be flushed every 6-8 weeks.   4. Lower extremity edema: Likely related to Taxotere. This has improved since last visit.  5. Abdominal bloating, pain and change in her bowel habits: He would benefit from gastroenterology evaluation for possible endoscopy and a colonoscopy. He never had those done and think would be reasonable to evaluate him for that.  6. Prognosis:  he had an excellent response to chemotherapy but likely would require different salvage therapy in the future. His disease is incurable but seems to be manageable at this time.  7. Follow-up: Will be in 6 weeks to assess his clinical status.    Zola Button, MD 4/20/201712:28 PM

## 2016-04-09 NOTE — Telephone Encounter (Signed)
Gave and printed appt shced and avs for pt for June °

## 2016-04-16 ENCOUNTER — Encounter: Payer: Self-pay | Admitting: Gastroenterology

## 2016-04-16 ENCOUNTER — Ambulatory Visit (INDEPENDENT_AMBULATORY_CARE_PROVIDER_SITE_OTHER): Payer: PPO | Admitting: Gastroenterology

## 2016-04-16 VITALS — BP 126/70 | HR 73 | Ht 63.0 in | Wt 140.0 lb

## 2016-04-16 DIAGNOSIS — Z1211 Encounter for screening for malignant neoplasm of colon: Secondary | ICD-10-CM | POA: Diagnosis not present

## 2016-04-16 DIAGNOSIS — R140 Abdominal distension (gaseous): Secondary | ICD-10-CM

## 2016-04-16 DIAGNOSIS — R6881 Early satiety: Secondary | ICD-10-CM | POA: Diagnosis not present

## 2016-04-16 MED ORDER — NA SULFATE-K SULFATE-MG SULF 17.5-3.13-1.6 GM/177ML PO SOLN: 1.0000 | Freq: Once | ORAL | Status: AC

## 2016-04-16 NOTE — Progress Notes (Signed)
Reviewed and agree with management plan.  Jaquaveon Bilal T. Mardy Hoppe, MD FACG 

## 2016-04-16 NOTE — Patient Instructions (Signed)
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
## 2016-04-27 DIAGNOSIS — C61 Malignant neoplasm of prostate: Secondary | ICD-10-CM | POA: Diagnosis not present

## 2016-04-27 DIAGNOSIS — R350 Frequency of micturition: Secondary | ICD-10-CM | POA: Diagnosis not present

## 2016-05-15 ENCOUNTER — Encounter: Payer: Self-pay | Admitting: Gastroenterology

## 2016-05-26 ENCOUNTER — Encounter: Payer: Self-pay | Admitting: Gastroenterology

## 2016-05-28 ENCOUNTER — Ambulatory Visit (AMBULATORY_SURGERY_CENTER): Payer: PPO | Admitting: Gastroenterology

## 2016-05-28 ENCOUNTER — Encounter: Payer: Self-pay | Admitting: Gastroenterology

## 2016-05-28 VITALS — BP 128/73 | HR 74 | Temp 97.7°F | Resp 17 | Ht 63.0 in | Wt 140.0 lb

## 2016-05-28 DIAGNOSIS — Z1211 Encounter for screening for malignant neoplasm of colon: Secondary | ICD-10-CM

## 2016-05-28 DIAGNOSIS — R6881 Early satiety: Secondary | ICD-10-CM

## 2016-05-28 DIAGNOSIS — R140 Abdominal distension (gaseous): Secondary | ICD-10-CM

## 2016-05-28 DIAGNOSIS — K648 Other hemorrhoids: Secondary | ICD-10-CM

## 2016-05-28 HISTORY — DX: Other hemorrhoids: K64.8

## 2016-05-28 HISTORY — PX: COLONOSCOPY: SHX174

## 2016-05-28 MED ORDER — SODIUM CHLORIDE 0.9 % IV SOLN: 500.0000 mL | INTRAVENOUS | Status: DC

## 2016-05-28 NOTE — Progress Notes (Signed)
Report to PACU, RN, vss, BBS= Clear.  

## 2016-05-28 NOTE — Patient Instructions (Signed)

## 2016-05-28 NOTE — Op Note (Signed)
[facility] Patient Name: [name] Johnston Procedure Date: 05/28/2016 1:54 PM MRN: [id number] Endoscopist: Ladene Artist , MD Age: 65 Referring MD:  Date of Birth: October 05, 1951 Gender: Male Procedure:                Upper GI endoscopy Indications:              Abdominal bloating, Early satiety Medicines:                Monitored Anesthesia Care Procedure:                Pre-Anesthesia Assessment:                           - Prior to the procedure, a History and Physical                            was performed, and patient medications and                            allergies were reviewed. The patient's tolerance of                            previous anesthesia was also reviewed. The risks                            and benefits of the procedure and the sedation                            options and risks were discussed with the patient.                            All questions were answered, and informed consent                            was obtained. Prior Anticoagulants: The patient has                            taken no previous anticoagulant or antiplatelet                            agents. ASA Grade Assessment: III - A patient with                            severe systemic disease. After reviewing the risks                            and benefits, the patient was deemed in                            satisfactory condition to undergo the procedure.                           After obtaining informed consent, the endoscope was  passed under direct vision. Throughout the                            procedure, the patient's blood pressure, pulse, and                            oxygen saturations were monitored continuously. The                            Model GIF-HQ190 [id number]) scope was introduced                            through the mouth, and advanced to the second part                            of duodenum. The upper GI endoscopy was                         accomplished without difficulty. The patient                            tolerated the procedure well. Scope In: Scope Out: Findings:                 The esophagus was normal.                           The stomach was normal.                           The examined duodenum was normal.                           The cardia and gastric fundus were normal on                            retroflexion. Complications:            No immediate complications. Estimated Blood Loss:     Estimated blood loss: none. Impression:               - Normal esophagus.                           - Normal stomach.                           - Normal examined duodenum. Recommendation:           - Patient has a contact number available for                            emergencies. The signs and symptoms of potential                            delayed complications were discussed with the  patient. Return to normal activities tomorrow.                            Written discharge instructions were provided to the                            patient.                           - Resume previous diet.                           - Continue present medications.                           - Follow up with Dr. Alen Blew and Dr. Carl Best, MD 05/28/2016 2:05:25 PM This report has been signed electronically.

## 2016-05-28 NOTE — Op Note (Signed)
[facility] Patient Name: [name] Johnston Procedure Date: 05/28/2016 1:20 PM MRN: [id number] Endoscopist: Ladene Artist , MD Age: 65 Referring MD:  Date of Birth: 07-14-1951 Gender: Male Procedure:                Colonoscopy Indications:              Screening for colorectal malignant neoplasm Medicines:                Monitored Anesthesia Care Procedure:                Pre-Anesthesia Assessment:                           - Prior to the procedure, a History and Physical                            was performed, and patient medications and                            allergies were reviewed. The patient's tolerance of                            previous anesthesia was also reviewed. The risks                            and benefits of the procedure and the sedation                            options and risks were discussed with the patient.                            All questions were answered, and informed consent                            was obtained. Prior Anticoagulants: The patient has                            taken no previous anticoagulant or antiplatelet                            agents. ASA Grade Assessment: III - A patient with                            severe systemic disease. After reviewing the risks                            and benefits, the patient was deemed in                            satisfactory condition to undergo the procedure.                           After obtaining informed consent, the colonoscope  was passed under direct vision. Throughout the                            procedure, the patient's blood pressure, pulse, and                            oxygen saturations were monitored continuously. The                            Model PCF-[id number]) scope was introduced                            through the anus and advanced to the the cecum,                            identified by appendiceal orifice and  ileocecal                            valve. The ileocecal valve, appendiceal orifice,                            and rectum were photographed. The quality of the                            bowel preparation was excellent. The colonoscopy                            was performed without difficulty. The patient                            tolerated the procedure well. Scope In: 1:41:03 PM Scope Out: 1:53:12 PM Scope Withdrawal Time: 0 hours 10 minutes 21 seconds  Total Procedure Duration: 0 hours 12 minutes 9 seconds  Findings:                 Internal hemorrhoids were found during forward                            view. The hemorrhoids were small and Grade I                            (internal hemorrhoids that do not prolapse).                           The exam was otherwise without abnormality on                            direct views. Retroflexion not performed due to                            narrow rectal vault. Complications:            No immediate complications. Estimated blood loss:  None. Estimated Blood Loss:     Estimated blood loss: none. Impression:               - Internal hemorrhoids.                           - The examination was otherwise normal on direct                            views. Recommendation:           - Repeat colonoscopy in 10 years for screening                            purposes if health status appropriate for CRC                            screening.                           - Patient has a contact number available for                            emergencies. The signs and symptoms of potential                            delayed complications were discussed with the                            patient. Return to normal activities tomorrow.                            Written discharge instructions were provided to the                            patient.                           - Resume previous diet.                            - Continue present medications. Ladene Artist, MD 05/28/2016 2:01:14 PM This report has been signed electronically.

## 2016-05-29 ENCOUNTER — Telehealth: Payer: Self-pay | Admitting: *Deleted

## 2016-05-29 NOTE — Telephone Encounter (Signed)
Left message on f/u call 

## 2016-06-02 ENCOUNTER — Other Ambulatory Visit (HOSPITAL_BASED_OUTPATIENT_CLINIC_OR_DEPARTMENT_OTHER): Payer: PPO

## 2016-06-02 ENCOUNTER — Ambulatory Visit (HOSPITAL_BASED_OUTPATIENT_CLINIC_OR_DEPARTMENT_OTHER): Payer: PPO

## 2016-06-02 ENCOUNTER — Ambulatory Visit (HOSPITAL_BASED_OUTPATIENT_CLINIC_OR_DEPARTMENT_OTHER): Payer: PPO | Admitting: Oncology

## 2016-06-02 ENCOUNTER — Telehealth: Payer: Self-pay | Admitting: Oncology

## 2016-06-02 VITALS — BP 139/84 | HR 76 | Temp 98.4°F | Resp 18 | Wt 142.5 lb

## 2016-06-02 DIAGNOSIS — C61 Malignant neoplasm of prostate: Secondary | ICD-10-CM

## 2016-06-02 DIAGNOSIS — C787 Secondary malignant neoplasm of liver and intrahepatic bile duct: Secondary | ICD-10-CM | POA: Diagnosis not present

## 2016-06-02 DIAGNOSIS — R109 Unspecified abdominal pain: Secondary | ICD-10-CM

## 2016-06-02 DIAGNOSIS — Z95828 Presence of other vascular implants and grafts: Secondary | ICD-10-CM | POA: Insufficient documentation

## 2016-06-02 LAB — COMPREHENSIVE METABOLIC PANEL
ALBUMIN: 3.6 g/dL (ref 3.5–5.0)
ALT: 21 U/L (ref 0–55)
ANION GAP: 8 meq/L (ref 3–11)
AST: 15 U/L (ref 5–34)
Alkaline Phosphatase: 106 U/L (ref 40–150)
BUN: 10.4 mg/dL (ref 7.0–26.0)
CALCIUM: 9.4 mg/dL (ref 8.4–10.4)
CHLORIDE: 104 meq/L (ref 98–109)
CO2: 28 mEq/L (ref 22–29)
Creatinine: 0.8 mg/dL (ref 0.7–1.3)
Glucose: 113 mg/dl (ref 70–140)
POTASSIUM: 3.8 meq/L (ref 3.5–5.1)
SODIUM: 140 meq/L (ref 136–145)
Total Bilirubin: 0.49 mg/dL (ref 0.20–1.20)
Total Protein: 7.4 g/dL (ref 6.4–8.3)

## 2016-06-02 LAB — CBC WITH DIFFERENTIAL/PLATELET
BASO%: 0.6 % (ref 0.0–2.0)
Basophils Absolute: 0 10*3/uL (ref 0.0–0.1)
EOS%: 6 % (ref 0.0–7.0)
Eosinophils Absolute: 0.2 10*3/uL (ref 0.0–0.5)
HCT: 38.4 % (ref 38.4–49.9)
HEMOGLOBIN: 12.6 g/dL — AB (ref 13.0–17.1)
LYMPH%: 27.1 % (ref 14.0–49.0)
MCH: 26.9 pg — ABNORMAL LOW (ref 27.2–33.4)
MCHC: 32.9 g/dL (ref 32.0–36.0)
MCV: 81.7 fL (ref 79.3–98.0)
MONO#: 0.2 10*3/uL (ref 0.1–0.9)
MONO%: 6.5 % (ref 0.0–14.0)
NEUT#: 2 10*3/uL (ref 1.5–6.5)
NEUT%: 59.8 % (ref 39.0–75.0)
PLATELETS: 189 10*3/uL (ref 140–400)
RBC: 4.7 10*6/uL (ref 4.20–5.82)
RDW: 13.5 % (ref 11.0–14.6)
WBC: 3.4 10*3/uL — AB (ref 4.0–10.3)
lymph#: 0.9 10*3/uL (ref 0.9–3.3)

## 2016-06-02 MED ORDER — SODIUM CHLORIDE 0.9 % IJ SOLN
10.0000 mL | INTRAMUSCULAR | Status: DC | PRN
  Administered 2016-06-02: 10 mL via INTRAVENOUS
  Filled 2016-06-02: qty 10

## 2016-06-02 MED ORDER — HEPARIN SOD (PORK) LOCK FLUSH 100 UNIT/ML IV SOLN
500.0000 [IU] | Freq: Once | INTRAVENOUS | Status: AC | PRN
  Administered 2016-06-02: 500 [IU] via INTRAVENOUS
  Filled 2016-06-02: qty 5

## 2016-06-02 NOTE — Telephone Encounter (Signed)
Gave and printed appt sched and avs for pt for Aug °

## 2016-06-02 NOTE — Progress Notes (Signed)
Hematology and Oncology Follow Up Visit  [name] Johnston [id number] 09/13/1951 65 y.o. 06/02/2016 10:44 AM [name] Johnston, MDDewey, [name], MD   Principle Diagnosis: 65 year old gentleman with prostate cancer diagnosed in 2014. He presented with obstructive symptoms and found to have a Gleason score 4+4 = 8 prostate cancer with PSA of around 40.  Liver mass diagnosed in December 2015 with an elevated AFP of 9.3. Biopsy proven to be prostate cancer.  Prior Therapy:  He is started [place] on 06/27/2013. He subsequently received definitive radiation therapy for a planned dose of 75 gray completed in November 2014 under the care of [name] Johnston.  His PSA subsequently noted close to 0.33. PSA was up to 11 on 10/29/2014 with a castrate levels of testosterone of 19.   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  Docetaxel 75 mg/m given every 3 weeks with Neulasta support. He is status post 10 cycles of treatment concluded on 11/21/2015. He had an excellent PSA response with PSA dropping from 222 to 0.17.   Current therapy: Observation and surveillance.  Interim History:  [name] Johnston presents today for a follow-up visit with an interpreter. Since his last visit, he reports continuous improvement in his health. He denied any nausea or vomiting. He does report alteration in his bowel habits with diarrhea and constipation. He denied any delayed complications related to chemotherapy. He denied peripheral neuropathy and his lower extremity edema is improving and nearly resolved. His performance status is improving and he is more active. He reports no abdominal pain or arthralgias. He denied any back pain or any other bone pain.   He does not report any headaches or blurry vision or syncope. He does not report any fevers or chills.  He does not report any frequency urgency but does report dysuria and occasional hematuria. He does not report any skeletal  complaints of back pain shoulder pain or hip pain. He does not report any lymphadenopathy or petechiae. Remainder of his review of systems unremarkable.   Medications: I have reviewed the patient's current medications.  No current outpatient prescriptions on file.   No current facility-administered medications for this visit.     Allergies:  Allergies  Allergen Reactions  . Aspirin Hives    Past Medical History, Surgical history, Social history, and Family History were reviewed and updated.    Physical Exam: Blood pressure 139/84, pulse 76, temperature 98.4 F (36.9 C), temperature source Oral, resp. rate 18, weight 142 lb 8 oz (64.638 kg), SpO2 98 %. ECOG: 1 General appearance: Well-appearing gentleman without distress. Head: Normocephalic, without obvious abnormality. No oral ulcers or lesions. Neck: no adenopathy no thyroid masses. Lymph nodes: Cervical, supraclavicular, and axillary nodes normal. Heart:regular rate and rhythm, S1, S2 normal, no murmur, click, rub or gallop  Chest wall examination: Port-A-Cath site appeared without erythema or induration. Lung:chest clear, no wheezing, rales, normal symmetric air entry Abdomin: soft, non-tender, without masses or organomegaly. No shifting dullness or ascites. EXT: Very little edema noted. Skin: dry skin noted without any erythema or induration.    Lab Results: Lab Results  Component Value Date   WBC 3.4* 06/02/2016   HGB 12.6* 06/02/2016   HCT 38.4 06/02/2016   MCV 81.7 06/02/2016   PLT 189 06/02/2016     Chemistry      Component Value Date/Time   NA 140 04/07/2016 1251   NA 138 04/15/2015 2219   K 4.0 04/07/2016 1251   K 4.5 04/15/2015 2219  CL 102 04/15/2015 2219   CO2 26 04/07/2016 1251   CO2 25 04/15/2015 2219   BUN 11.9 04/07/2016 1251   BUN 13 04/15/2015 2219   CREATININE 0.7 04/07/2016 1251   CREATININE 0.95 04/15/2015 2219      Component Value Date/Time   CALCIUM 9.8 04/07/2016 1251   CALCIUM  9.5 04/15/2015 2219   ALKPHOS 129 04/07/2016 1251   ALKPHOS 92 04/15/2015 2219   AST 19 04/07/2016 1251   AST 24 04/15/2015 2219   ALT 19 04/07/2016 1251   ALT 21 04/15/2015 2219   BILITOT 0.55 04/07/2016 1251   BILITOT 0.8 04/15/2015 2219     Results for [name], Johnston (MRN [id number]) as of 04/09/2016 12:04  Ref. Range 04/07/2016 12:51  PSA Latest Ref Range: 0.0-4.0 ng/mL 1.1         Impression and Plan:  65 year old gentleman with the following issues:  1. Prostate cancer diagnosed in June 2014. He presented with obstructive symptoms and found to have a Gleason score 4+4 = 8 prostate cancer with PSA of around 40. He was treated with androgen deprivation and radiation therapy with an excellent initial response and a PSA nadir of 0.33. PSA was up to 19 and Casodex was added.  He developed progression of disease with PSA up to 222 with liver metastasis.  He is status post Taxotere chemotherapy with an excellent response by PSA criteria as well as CT scan.   CT scan done on 04/07/2016 continues to show decrease in his hepatic metastasis.  His most recent PSA showed slight increase of to 1.1 but imaging studies does not show any progression of disease. The plan is to continue with observation and surveillance and he is salvage therapy upon symptomatic progression. I plan on repeating imaging studies in October 2017.  2. Liver masses: biopsy-proven to be prostate cancer without any evidence of hepatocellular carcinoma. CT scan on 04/07/2016 showed continued response and decrease in the size.  3.  IV access: Port-A-Cath maintained without complications. This will be flushed every 2 months.   4. Lower extremity edema: Likely related to Taxotere. This has resolved at this time.  5. Abdominal bloating, pain and change in her bowel habits: Symptoms improved at this time.  6. Prognosis:  he had an excellent response to chemotherapy but likely would require different salvage therapy in  the future. He continues to be incurable but certainly his cancer is manageable at this time.  7. Follow-up: Will be in 8 weeks to assess his clinical status.    [facility], MD 6/13/201710:44 AM

## 2016-06-02 NOTE — Patient Instructions (Signed)

## 2016-06-03 LAB — PSA: PROSTATE SPECIFIC AG, SERUM: 3.8 ng/mL (ref 0.0–4.0)

## 2016-06-03 LAB — TESTOSTERONE: TESTOSTERONE: 10 ng/dL — AB (ref 348–1197)

## 2016-08-04 ENCOUNTER — Other Ambulatory Visit (HOSPITAL_BASED_OUTPATIENT_CLINIC_OR_DEPARTMENT_OTHER): Payer: PPO

## 2016-08-04 ENCOUNTER — Ambulatory Visit (HOSPITAL_BASED_OUTPATIENT_CLINIC_OR_DEPARTMENT_OTHER): Payer: PPO

## 2016-08-04 DIAGNOSIS — Z95828 Presence of other vascular implants and grafts: Secondary | ICD-10-CM

## 2016-08-04 DIAGNOSIS — C61 Malignant neoplasm of prostate: Secondary | ICD-10-CM

## 2016-08-04 LAB — COMPREHENSIVE METABOLIC PANEL
ALBUMIN: 3.5 g/dL (ref 3.5–5.0)
ALK PHOS: 110 U/L (ref 40–150)
ALT: 21 U/L (ref 0–55)
AST: 18 U/L (ref 5–34)
Anion Gap: 8 mEq/L (ref 3–11)
BILIRUBIN TOTAL: 0.3 mg/dL (ref 0.20–1.20)
BUN: 13.8 mg/dL (ref 7.0–26.0)
CO2: 28 meq/L (ref 22–29)
CREATININE: 0.8 mg/dL (ref 0.7–1.3)
Calcium: 9.7 mg/dL (ref 8.4–10.4)
Chloride: 108 mEq/L (ref 98–109)
GLUCOSE: 110 mg/dL (ref 70–140)
Potassium: 3.8 mEq/L (ref 3.5–5.1)
SODIUM: 143 meq/L (ref 136–145)
TOTAL PROTEIN: 7.5 g/dL (ref 6.4–8.3)

## 2016-08-04 LAB — CBC WITH DIFFERENTIAL/PLATELET
BASO%: 0.7 % (ref 0.0–2.0)
Basophils Absolute: 0 10*3/uL (ref 0.0–0.1)
EOS ABS: 0.2 10*3/uL (ref 0.0–0.5)
EOS%: 5.1 % (ref 0.0–7.0)
HCT: 37.1 % — ABNORMAL LOW (ref 38.4–49.9)
HEMOGLOBIN: 12.5 g/dL — AB (ref 13.0–17.1)
LYMPH%: 23.1 % (ref 14.0–49.0)
MCH: 27 pg — ABNORMAL LOW (ref 27.2–33.4)
MCHC: 33.6 g/dL (ref 32.0–36.0)
MCV: 80.3 fL (ref 79.3–98.0)
MONO#: 0.3 10*3/uL (ref 0.1–0.9)
MONO%: 6.9 % (ref 0.0–14.0)
NEUT%: 64.2 % (ref 39.0–75.0)
NEUTROS ABS: 2.6 10*3/uL (ref 1.5–6.5)
Platelets: 208 10*3/uL (ref 140–400)
RBC: 4.63 10*6/uL (ref 4.20–5.82)
RDW: 13.9 % (ref 11.0–14.6)
WBC: 4.1 10*3/uL (ref 4.0–10.3)
lymph#: 0.9 10*3/uL (ref 0.9–3.3)

## 2016-08-04 MED ORDER — SODIUM CHLORIDE 0.9 % IJ SOLN
10.0000 mL | INTRAMUSCULAR | Status: DC | PRN
Start: 2016-08-04 — End: 2016-08-04
  Administered 2016-08-04: 10 mL via INTRAVENOUS
  Filled 2016-08-04: qty 10

## 2016-08-04 MED ORDER — HEPARIN SOD (PORK) LOCK FLUSH 100 UNIT/ML IV SOLN
500.0000 [IU] | Freq: Once | INTRAVENOUS | Status: AC | PRN
  Administered 2016-08-04: 500 [IU] via INTRAVENOUS
  Filled 2016-08-04: qty 5

## 2016-08-05 LAB — PSA: Prostate Specific Ag, Serum: 8.9 ng/mL — ABNORMAL HIGH (ref 0.0–4.0)

## 2016-08-06 ENCOUNTER — Telehealth: Payer: Self-pay | Admitting: Oncology

## 2016-08-06 ENCOUNTER — Ambulatory Visit (HOSPITAL_BASED_OUTPATIENT_CLINIC_OR_DEPARTMENT_OTHER): Payer: PPO | Admitting: Oncology

## 2016-08-06 VITALS — BP 128/70 | HR 78 | Temp 98.2°F | Resp 18 | Ht 63.0 in | Wt 143.6 lb

## 2016-08-06 DIAGNOSIS — R140 Abdominal distension (gaseous): Secondary | ICD-10-CM

## 2016-08-06 DIAGNOSIS — C787 Secondary malignant neoplasm of liver and intrahepatic bile duct: Secondary | ICD-10-CM | POA: Diagnosis not present

## 2016-08-06 DIAGNOSIS — R609 Edema, unspecified: Secondary | ICD-10-CM

## 2016-08-06 DIAGNOSIS — C61 Malignant neoplasm of prostate: Secondary | ICD-10-CM

## 2016-08-06 MED ORDER — ABIRATERONE ACETATE 250 MG PO TABS: 1000.0000 mg | ORAL_TABLET | Freq: Every day | ORAL | 0 refills | Status: DC

## 2016-08-06 NOTE — Progress Notes (Signed)
Hematology and Oncology Follow Up Visit  [name] Johnston [id number] July 10, 1951 65 y.o. 08/06/2016 1:31 PM Johnston,[name], MDDewey, [name], MD   Principle Diagnosis: 65 year old gentleman with prostate cancer diagnosed in 2014. He presented with obstructive symptoms and found to have a Gleason score 4+4 = 8 prostate cancer with PSA of around 40.  Liver mass diagnosed in December 2015 with an elevated AFP of 9.3. Biopsy proven to be prostate cancer.  Prior Therapy:  He is started [place] on 06/27/2013. He subsequently received definitive radiation therapy for a planned dose of 75 gray completed in November 2014 under the care of Dr. Tammi Klippel.  His PSA subsequently noted close to 0.33. PSA was up to 11 on 10/29/2014 with a castrate levels of testosterone of 19.   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  Docetaxel 75 mg/m given every 3 weeks with Neulasta support. He is status post 10 cycles of treatment concluded on 11/21/2015. He had an excellent PSA response with PSA dropping from 222 to 0.17.   Current therapy: Under consideration to start second line therapy.  Interim History:  Mr. [name] presents today for a follow-up visit with an interpreter. Since his last visit, he reports no major changes in his health. His performance status and activity level continues to improve and close to baseline. He denied any nausea or vomiting. He does report alteration in his bowel habits with diarrhea and constipation.  He denied peripheral neuropathy and his lower extremity edema continued to persist at this time.  He reports no abdominal pain or arthralgias. He denied any back pain or any other bone pain.   He does not report any headaches or blurry vision or syncope. He does not report any fevers or chills.  He does not report any frequency urgency but does report dysuria and occasional hematuria. He does not report any skeletal complaints of  back pain shoulder pain or hip pain. He does not report any lymphadenopathy or petechiae. Remainder of his review of systems unremarkable.   Medications: I have reviewed the patient's current medications.  Current Outpatient Prescriptions  Medication Sig Dispense Refill  . abiraterone Acetate (ZYTIGA) 250 MG tablet Take 4 tablets (1,000 mg total) by mouth daily. Take on an empty stomach 1 hour before or 2 hours after a meal 120 tablet 0   No current facility-administered medications for this visit.      Allergies:  Allergies  Allergen Reactions  . Aspirin Hives    Past Medical History, Surgical history, Social history, and Family History were reviewed and updated.    Physical Exam: Blood pressure 128/70, pulse 78, temperature 98.2 F (36.8 C), temperature source Oral, resp. rate 18, height 5\' 3"  (1.6 m), weight 143 lb 9.6 oz (65.1 kg), SpO2 99 %. ECOG: 1 General appearance: Alert, awake gentleman without distress. Head: Normocephalic, without obvious abnormality. No oral thrush noted. Neck: no adenopathy no thyroid masses. Lymph nodes: Cervical, supraclavicular, and axillary nodes normal. Heart:regular rate and rhythm, S1, S2 normal, no murmur, click, rub or gallop  Chest wall examination: Port-A-Cath site appeared without erythema or induration. Lung:chest clear, no wheezing, rales, normal symmetric air entry Abdomin: soft, non-tender, without masses or organomegaly. No rebound or guarding. EXT: Very little edema noted. Skin: dry skin noted without any erythema or induration.    Lab Results: Lab Results  Component Value Date   WBC 4.1 08/04/2016   HGB 12.5 (L) 08/04/2016   HCT 37.1 (L) 08/04/2016  MCV 80.3 08/04/2016   PLT 208 08/04/2016     Chemistry      Component Value Date/Time   NA 143 08/04/2016 1058   K 3.8 08/04/2016 1058   CL 102 04/15/2015 2219   CO2 28 08/04/2016 1058   BUN 13.8 08/04/2016 1058   CREATININE 0.8 08/04/2016 1058      Component Value  Date/Time   CALCIUM 9.7 08/04/2016 1058   ALKPHOS 110 08/04/2016 1058   AST 18 08/04/2016 1058   ALT 21 08/04/2016 1058   BILITOT 0.30 08/04/2016 1058       Results for MAZI, RAUSCH (MRN [id number]) as of 08/06/2016 13:06  Ref. Range 02/27/2016 13:48 04/07/2016 12:51 06/02/2016 10:19 08/04/2016 10:58  PSA Latest Ref Range: 0.0 - 4.0 ng/mL 0.4 1.1 3.8 8.9 (H)        Impression and Plan:  65 year old gentleman with the following issues:  1. Prostate cancer diagnosed in June 2014. He presented with obstructive symptoms and found to have a Gleason score 4+4 = 8 prostate cancer with PSA of around 40. He was treated with androgen deprivation and radiation therapy with an excellent initial response and a PSA nadir of 0.33. PSA was up to 19 and Casodex was added.  He developed progression of disease with PSA up to 222 with liver metastasis.  He is status post Taxotere chemotherapy with an excellent response by PSA criteria as well as CT scan.   CT scan done on 04/07/2016 continues to show decrease in his hepatic metastasis.  His most recent PSA On 08/04/2016 continued to show increase without any clinical signs of progression. We have discussed different salvage regimen at this time including retreatment with Taxotere chemotherapy versus Jevtana chemotherapy. The role of Zytiga was also discussed in this particular setting.  The risks and benefits of these approaches were reviewed and given the fact that he does not have any symptoms but certainly warrants treatment given the rise in his PSA and his castration resistant status. He will be a good candidate for Zytiga at this time. Complications associated with this medication were reviewed including lower extremity edema, hypertension and hypokalemia. He is agreeable to proceed at this time.  The plan is start with 1000 mg daily of Zytiga without prednisone. Prednisone will be omitted because of his chronic lower extremity edema which could be  exacerbated with prednisone.  2. Liver masses: biopsy-proven to be prostate cancer CT scan on 04/07/2016 showed continued response and decrease in the size.  3.  IV access: Port-A-Cath maintained without complications. This will be flushed every 2 months.   4. Lower extremity edema: Likely related to Taxotere. Continues to persist at this time. No need for diuretics currently.  5. Abdominal bloating, pain and change in her bowel habits: Symptoms improved at this time.  6. Prognosis:  he had an excellent response to chemotherapy but likely would require different salvage therapy in the future. He continues to be incurable but certainly his cancer continues to be responsive to treatment and he is willing to continue.  7. Follow-up: Will be in 4 weeks to assess his clinical status.    Zola Button, MD 8/17/20171:31 PM

## 2016-08-06 NOTE — Telephone Encounter (Signed)
Gv pt appt for 9/21.

## 2016-08-07 ENCOUNTER — Other Ambulatory Visit: Payer: Self-pay

## 2016-08-07 DIAGNOSIS — C61 Malignant neoplasm of prostate: Secondary | ICD-10-CM

## 2016-08-07 MED ORDER — PREDNISONE 5 MG PO TABS: 5.0000 mg | ORAL_TABLET | Freq: Two times a day (BID) | ORAL | 3 refills | Status: DC

## 2016-08-07 NOTE — Progress Notes (Signed)
Zytiga 250 mg #120 Prescription Status  PA sent to insurance company - status denied  Insurance requires Prednisone 5 mg BID to be administered with Zytiga.  Spoke with Dr Alen Blew and he gave verbal for Prednisone 5 mg BID x 60 to be sent to patients pharmacy.  Sent to [employer] on file.  Appeal then faxed to insurance - awaiting response.  Per representative once they see a paid claim for prednisone they will approve the Zytiga for the patient.  Will Follow- up 08/10/16  Henreitta Leber, PharmD [facility]

## 2016-08-12 ENCOUNTER — Telehealth: Payer: Self-pay | Admitting: Pharmacist

## 2016-08-12 NOTE — Telephone Encounter (Signed)
Oral Chemotherapy Pharmacist Encounter   Attempted to call patient's home phone number 2 times today. Was not able to get in contact with anyone or leave a message.  Received notification from [place] that [facility] co-pay $2900. There are co-pay assistance funds available today through the patient advocate foundation. I will need proof of income in order to apply for this grant money.  Oral Chemo Clinic will continue to attempt to request financial documentation to enroll patient in PAF.  Johny Drilling, PharmD, BCPS Oral Chemotherapy Clinic

## 2016-08-17 ENCOUNTER — Telehealth: Payer: Self-pay | Admitting: Pharmacist

## 2016-08-17 NOTE — Telephone Encounter (Signed)
Oral Chemotherapy Pharmacist Encounter Attempted to reach patient for follow up on patient assistance for oral medication, Zytiga, through enrollment in PAF. To enroll the patient we need additional financial information. No answer for telephone call. Unable to left VM. Today was the 3rd attempt to reach the patient. Next office visit scheduled for 9/21.  Thank you,  Nuala Alpha, PharmD Oral [facility]otherapy [facility] [phone]

## 2016-08-28 DIAGNOSIS — R350 Frequency of micturition: Secondary | ICD-10-CM | POA: Diagnosis not present

## 2016-08-28 DIAGNOSIS — C61 Malignant neoplasm of prostate: Secondary | ICD-10-CM | POA: Diagnosis not present

## 2016-09-01 ENCOUNTER — Ambulatory Visit: Payer: PPO | Admitting: Oncology

## 2016-09-10 ENCOUNTER — Telehealth: Payer: Self-pay | Admitting: Oncology

## 2016-09-10 ENCOUNTER — Ambulatory Visit (HOSPITAL_BASED_OUTPATIENT_CLINIC_OR_DEPARTMENT_OTHER): Payer: PPO | Admitting: Oncology

## 2016-09-10 ENCOUNTER — Ambulatory Visit (HOSPITAL_BASED_OUTPATIENT_CLINIC_OR_DEPARTMENT_OTHER): Payer: PPO

## 2016-09-10 ENCOUNTER — Other Ambulatory Visit (HOSPITAL_BASED_OUTPATIENT_CLINIC_OR_DEPARTMENT_OTHER): Payer: PPO

## 2016-09-10 ENCOUNTER — Encounter: Payer: Self-pay | Admitting: Pharmacist

## 2016-09-10 VITALS — BP 142/80 | HR 72 | Temp 98.2°F | Resp 18 | Ht 63.0 in | Wt 140.8 lb

## 2016-09-10 DIAGNOSIS — C787 Secondary malignant neoplasm of liver and intrahepatic bile duct: Secondary | ICD-10-CM

## 2016-09-10 DIAGNOSIS — C61 Malignant neoplasm of prostate: Secondary | ICD-10-CM

## 2016-09-10 DIAGNOSIS — R609 Edema, unspecified: Secondary | ICD-10-CM

## 2016-09-10 DIAGNOSIS — Z95828 Presence of other vascular implants and grafts: Secondary | ICD-10-CM

## 2016-09-10 LAB — COMPREHENSIVE METABOLIC PANEL
ALBUMIN: 3.5 g/dL (ref 3.5–5.0)
ALK PHOS: 100 U/L (ref 40–150)
ALT: 25 U/L (ref 0–55)
ANION GAP: 10 meq/L (ref 3–11)
AST: 15 U/L (ref 5–34)
BILIRUBIN TOTAL: 0.63 mg/dL (ref 0.20–1.20)
BUN: 14.2 mg/dL (ref 7.0–26.0)
CO2: 26 mEq/L (ref 22–29)
Calcium: 9.3 mg/dL (ref 8.4–10.4)
Chloride: 105 mEq/L (ref 98–109)
Creatinine: 0.8 mg/dL (ref 0.7–1.3)
GLUCOSE: 94 mg/dL (ref 70–140)
POTASSIUM: 3.7 meq/L (ref 3.5–5.1)
SODIUM: 142 meq/L (ref 136–145)
TOTAL PROTEIN: 7.5 g/dL (ref 6.4–8.3)

## 2016-09-10 LAB — CBC WITH DIFFERENTIAL/PLATELET
BASO%: 0.2 % (ref 0.0–2.0)
Basophils Absolute: 0 10*3/uL (ref 0.0–0.1)
EOS ABS: 0.1 10*3/uL (ref 0.0–0.5)
EOS%: 1.2 % (ref 0.0–7.0)
HCT: 37.4 % — ABNORMAL LOW (ref 38.4–49.9)
HGB: 12.7 g/dL — ABNORMAL LOW (ref 13.0–17.1)
LYMPH%: 15.8 % (ref 14.0–49.0)
MCH: 27.4 pg (ref 27.2–33.4)
MCHC: 34 g/dL (ref 32.0–36.0)
MCV: 80.6 fL (ref 79.3–98.0)
MONO#: 0.4 10*3/uL (ref 0.1–0.9)
MONO%: 6.4 % (ref 0.0–14.0)
NEUT#: 4.4 10*3/uL (ref 1.5–6.5)
NEUT%: 76.4 % — AB (ref 39.0–75.0)
PLATELETS: 207 10*3/uL (ref 140–400)
RBC: 4.64 10*6/uL (ref 4.20–5.82)
RDW: 13.8 % (ref 11.0–14.6)
WBC: 5.8 10*3/uL (ref 4.0–10.3)
lymph#: 0.9 10*3/uL (ref 0.9–3.3)

## 2016-09-10 MED ORDER — HEPARIN SOD (PORK) LOCK FLUSH 100 UNIT/ML IV SOLN
500.0000 [IU] | Freq: Once | INTRAVENOUS | Status: AC | PRN
  Administered 2016-09-10: 500 [IU] via INTRAVENOUS
  Filled 2016-09-10: qty 5

## 2016-09-10 MED ORDER — SODIUM CHLORIDE 0.9 % IJ SOLN
10.0000 mL | INTRAMUSCULAR | Status: DC | PRN
  Administered 2016-09-10: 10 mL via INTRAVENOUS
  Filled 2016-09-10: qty 10

## 2016-09-10 MED FILL — ZYTIGA 250 MG TABLET: 250 | 30 days supply | Qty: 120 | Fill #0

## 2016-09-10 NOTE — Progress Notes (Signed)
Oral Chemotherapy Pharmacist Encounter   I spoke with patient today at MD office visit for financial information to apply for copay assistance for his Zytiga (copay $2900). Communication was assisted by use of an interpreter. Household size and estimated income for 2016 obtained.  Patient qualified for assistance through Patient [place] [facility]). Award amount: $7500 Award dates: 09/10/16-09/09/17 Patient ID: [id number] RxBin: [id number] PCN: [id number] RxGrp: [id number]  I called [facility] with this information and Zytiga script was run on insurance with foundation assistance to help with copay. Copay now $0. The medication is ready and they will call patient's daughter at [phone] to pick it up.  Pt will bring in tax documents from 2016 tomorrow (9/22) to be used for [employer] assistance once [employer] are exhausted.  Initial counseling for medication offered, pt declined at this time, will let us know if he has any questions about the medication.  Oral Chemo Clinic will continue to follow.  Johny Drilling, PharmD, BCPS 09/10/2016  2:55 PM Oral Chemotherapy Clinic [phone]

## 2016-09-10 NOTE — Progress Notes (Signed)
Hematology and Oncology Follow Up Visit  [name] Johnston [id number] Sep 17, 1951 66 y.o. 09/10/2016 1:35 PM [name] Johnston,[name] Johnston, MDDewey, [name], MD   Principle Diagnosis: 65 year old gentleman with prostate cancer diagnosed in 2014. He presented with obstructive symptoms and found to have a Gleason score 4+4 = 8 prostate cancer with PSA of around 40.  Liver mass diagnosed in December 2015 with an elevated AFP of 9.3. Biopsy proven to be prostate cancer.  Prior Therapy:  He is started [place] on 06/27/2013. He subsequently received definitive radiation therapy for a planned dose of 75 gray completed in November 2014 under the care of Dr. Tammi Klippel.  His PSA subsequently noted close to 0.33. PSA was up to 11 on 10/29/2014 with a castrate levels of testosterone of 19.   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  Docetaxel 75 mg/m given every 3 weeks with Neulasta support. He is status post 10 cycles of treatment concluded on 11/21/2015. He had an excellent PSA response with PSA dropping from 222 to 0.17.   Current therapy: Under consideration to start Zytiga 1000 mg with prednisone.  Interim History:  [name] Johnston presents today for a follow-up visit with an interpreter. Since his last visit, he reports no new complaints. He does report some mild pain in his left arm which have subsided at this time. His appetite spoke the same although he lost a few pounds. His performance status and activity level not dramatically changed.   He denied any nausea or vomiting. He does report alteration in his bowel habits with diarrhea and constipation.  He denied peripheral neuropathy and his lower extremity edema resolved at this time.  He reports no abdominal pain or arthralgias. He denied any back pain or any other bone pain.   He does not report any headaches or blurry vision or syncope. He does not report any fevers or chills.  He does not report any  frequency urgency but does report dysuria and occasional hematuria. He does not report any skeletal complaints of back pain shoulder pain or hip pain. He does not report any lymphadenopathy or petechiae. Remainder of his review of systems unremarkable.   Medications: I have reviewed the patient's current medications.  Current Outpatient Prescriptions  Medication Sig Dispense Refill  . abiraterone Acetate (ZYTIGA) 250 MG tablet Take 4 tablets (1,000 mg total) by mouth daily. Take on an empty stomach 1 hour before or 2 hours after a meal 120 tablet 0  . predniSONE (DELTASONE) 5 MG tablet Take 1 tablet (5 mg total) by mouth 2 (two) times daily with a meal. 60 tablet 3   No current facility-administered medications for this visit.      Allergies:  Allergies  Allergen Reactions  . Aspirin Hives    Past Medical History, Surgical history, Social history, and Family History were reviewed and updated.    Physical Exam: Blood pressure (!) 142/80, pulse 72, temperature 98.2 F (36.8 C), temperature source Oral, resp. rate 18, height 5\' 3"  (1.6 m), weight 140 lb 12.8 oz (63.9 kg), SpO2 99 %. ECOG: 1 General appearance: Well-appearing gentleman without distress. Head: Normocephalic, without obvious abnormality. No oral thrush noted. Neck: no adenopathy no thyroid masses. Lymph nodes: Cervical, supraclavicular, and axillary nodes normal. Heart:regular rate and rhythm, S1, S2 normal, no murmur, click, rub or gallop  Chest wall examination: Port-A-Cath site appeared without erythema or induration. Lung:chest clear, no wheezing, rales, normal symmetric air entry Abdomin: soft, non-tender, without masses or organomegaly. No  rebound or guarding. EXT: no edema noted Skin: dry skin noted without any erythema or induration.    Lab Results: Lab Results  Component Value Date   WBC 5.8 09/10/2016   HGB 12.7 (L) 09/10/2016   HCT 37.4 (L) 09/10/2016   MCV 80.6 09/10/2016   PLT 207 09/10/2016      Chemistry      Component Value Date/Time   NA 142 09/10/2016 1245   K 3.7 09/10/2016 1245   CL 102 04/15/2015 2219   CO2 26 09/10/2016 1245   BUN 14.2 09/10/2016 1245   CREATININE 0.8 09/10/2016 1245      Component Value Date/Time   CALCIUM 9.3 09/10/2016 1245   ALKPHOS 100 09/10/2016 1245   AST 15 09/10/2016 1245   ALT 25 09/10/2016 1245   BILITOT 0.63 09/10/2016 1245         Results for [name], Johnston (MRN [id number]) as of 09/10/2016 13:36  Ref. Range 06/02/2016 10:19 08/04/2016 10:58  PSA Latest Ref Range: 0.0 - 4.0 ng/mL 3.8 8.9 (H)       Impression and Plan:  65 year old gentleman with the following issues:  1. Prostate cancer diagnosed in June 2014. He presented with obstructive symptoms and found to have a Gleason score 4+4 = 8 prostate cancer with PSA of around 40. He was treated with androgen deprivation and radiation therapy with an excellent initial response and a PSA nadir of 0.33. PSA was up to 19 and Casodex was added.  He developed progression of disease with PSA up to 222 with liver metastasis.  He is status post Taxotere chemotherapy with an excellent response by PSA criteria as well as CT scan.   CT scan done on 04/07/2016 continues to show decrease in his hepatic metastasis.  His PSA increased up to 8.9 and he is to start salvage Zytiga in the near future. He has not received this medication yet because of financial issues and he is applying for a system program. I reiterated the side effects associated with this medication as well as the importance take a daily with prednisone.  2. Liver masses: biopsy-proven to be prostate cancer CT scan on 04/07/2016 showed continued response and decrease in the size.  3.  IV access: Port-A-Cath maintained without complications. This will be flushed with every visit.   4. Lower extremity edema: Likely related to Taxotere. This has resolved.  5. Abdominal bloating, pain and change in her bowel habits: Resolved at  this time.  6. Prognosis:  he had an excellent response to chemotherapy and continues to have excellent performance status. I recommend continuing aggressive therapy.  7. Follow-up: Will be in 4 weeks to assess his clinical status.    [name] Button, MD 9/21/20171:35 PM

## 2016-09-10 NOTE — Telephone Encounter (Signed)
Gave patient avs report and appointments for October  °

## 2016-09-11 LAB — PSA: PROSTATE SPECIFIC AG, SERUM: 6 ng/mL — AB (ref 0.0–4.0)

## 2016-10-14 ENCOUNTER — Ambulatory Visit (HOSPITAL_BASED_OUTPATIENT_CLINIC_OR_DEPARTMENT_OTHER): Payer: PPO | Admitting: Oncology

## 2016-10-14 ENCOUNTER — Telehealth: Payer: Self-pay | Admitting: Oncology

## 2016-10-14 ENCOUNTER — Ambulatory Visit (HOSPITAL_BASED_OUTPATIENT_CLINIC_OR_DEPARTMENT_OTHER): Payer: PPO

## 2016-10-14 ENCOUNTER — Other Ambulatory Visit (HOSPITAL_BASED_OUTPATIENT_CLINIC_OR_DEPARTMENT_OTHER): Payer: PPO

## 2016-10-14 VITALS — BP 140/79 | HR 72 | Temp 98.2°F | Resp 18 | Ht 63.0 in | Wt 141.0 lb

## 2016-10-14 DIAGNOSIS — C61 Malignant neoplasm of prostate: Secondary | ICD-10-CM | POA: Diagnosis not present

## 2016-10-14 DIAGNOSIS — Z95828 Presence of other vascular implants and grafts: Secondary | ICD-10-CM

## 2016-10-14 DIAGNOSIS — C787 Secondary malignant neoplasm of liver and intrahepatic bile duct: Secondary | ICD-10-CM

## 2016-10-14 DIAGNOSIS — R609 Edema, unspecified: Secondary | ICD-10-CM

## 2016-10-14 LAB — CBC WITH DIFFERENTIAL/PLATELET
BASO%: 0.4 % (ref 0.0–2.0)
Basophils Absolute: 0 10*3/uL (ref 0.0–0.1)
EOS%: 0.7 % (ref 0.0–7.0)
Eosinophils Absolute: 0 10*3/uL (ref 0.0–0.5)
HCT: 37.2 % — ABNORMAL LOW (ref 38.4–49.9)
HGB: 12.4 g/dL — ABNORMAL LOW (ref 13.0–17.1)
LYMPH%: 17.1 % (ref 14.0–49.0)
MCH: 27.5 pg (ref 27.2–33.4)
MCHC: 33.4 g/dL (ref 32.0–36.0)
MCV: 82.3 fL (ref 79.3–98.0)
MONO#: 0.5 10*3/uL (ref 0.1–0.9)
MONO%: 8.8 % (ref 0.0–14.0)
NEUT%: 73 % (ref 39.0–75.0)
NEUTROS ABS: 4.1 10*3/uL (ref 1.5–6.5)
Platelets: 226 10*3/uL (ref 140–400)
RBC: 4.52 10*6/uL (ref 4.20–5.82)
RDW: 14.4 % (ref 11.0–14.6)
WBC: 5.7 10*3/uL (ref 4.0–10.3)
lymph#: 1 10*3/uL (ref 0.9–3.3)

## 2016-10-14 LAB — COMPREHENSIVE METABOLIC PANEL
ALT: 15 U/L (ref 0–55)
AST: 11 U/L (ref 5–34)
Albumin: 3.5 g/dL (ref 3.5–5.0)
Alkaline Phosphatase: 106 U/L (ref 40–150)
Anion Gap: 9 mEq/L (ref 3–11)
BILIRUBIN TOTAL: 0.37 mg/dL (ref 0.20–1.20)
BUN: 11.9 mg/dL (ref 7.0–26.0)
CO2: 26 meq/L (ref 22–29)
Calcium: 9.4 mg/dL (ref 8.4–10.4)
Chloride: 104 mEq/L (ref 98–109)
Creatinine: 0.7 mg/dL (ref 0.7–1.3)
GLUCOSE: 91 mg/dL (ref 70–140)
Potassium: 3.7 mEq/L (ref 3.5–5.1)
SODIUM: 140 meq/L (ref 136–145)
TOTAL PROTEIN: 7.6 g/dL (ref 6.4–8.3)

## 2016-10-14 MED ORDER — SODIUM CHLORIDE 0.9 % IJ SOLN
10.0000 mL | INTRAMUSCULAR | Status: DC | PRN
  Administered 2016-10-14: 10 mL via INTRAVENOUS
  Filled 2016-10-14: qty 10

## 2016-10-14 MED ORDER — HEPARIN SOD (PORK) LOCK FLUSH 100 UNIT/ML IV SOLN
500.0000 [IU] | Freq: Once | INTRAVENOUS | Status: AC | PRN
  Administered 2016-10-14: 500 [IU] via INTRAVENOUS
  Filled 2016-10-14: qty 5

## 2016-10-14 NOTE — Progress Notes (Signed)
Hematology and Oncology Follow Up Visit  [name] Johnston [id number] November 01, 1951 65 y.o. 10/14/2016 1:16 PM [name] Johnston, MDDewey, [name], MD   Principle Diagnosis: 65 year old gentleman with prostate cancer diagnosed in 2014. He presented with obstructive symptoms and found to have a Gleason score 4+4 = 8 prostate cancer with PSA of around 40.  Liver mass diagnosed in December 2015 with an elevated AFP of 9.3. Biopsy proven to be prostate cancer.  Prior Therapy:  He is started [place] on 06/27/2013. He subsequently received definitive radiation therapy for a planned dose of 75 gray completed in November 2014 under the care of Dr. Tammi Klippel.  His PSA subsequently noted close to 0.33. PSA was up to 11 on 10/29/2014 with a castrate levels of testosterone of 19.   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  Docetaxel 75 mg/m given every 3 weeks with Neulasta support. He is status post 10 cycles of treatment concluded on 11/21/2015. He had an excellent PSA response with PSA dropping from 222 to 0.17.   Current therapy: Zytiga 1000 mg with prednisone.  Therapy started in September 2017.  Interim History:  [name] Johnston presents today for a follow-up visit with an interpreter. Since his last visit, he reports no changes in his health. He started Zytiga with prednisone and have tolerated it reasonably well.  His appetite remained the same and his weight is stable. His performance status and activity level not dramatically changed. He denies any lower extremity edema, nausea or excessive fatigue. He denied peripheral neuropathy and his lower extremity edema resolved at this time.  He reports no abdominal pain or arthralgias. He denied any back pain or any other bone pain.   He does not report any headaches or blurry vision or syncope. He does not report any fevers or chills.  He does not report any frequency urgency but does report dysuria and  occasional hematuria. He does not report any skeletal complaints of back pain shoulder pain or hip pain. He does not report any lymphadenopathy or petechiae. Remainder of his review of systems unremarkable.   Medications: I have reviewed the patient's current medications.  Current Outpatient Prescriptions  Medication Sig Dispense Refill  . abiraterone Acetate (ZYTIGA) 250 MG tablet Take 4 tablets (1,000 mg total) by mouth daily. Take on an empty stomach 1 hour before or 2 hours after a meal 120 tablet 0  . predniSONE (DELTASONE) 5 MG tablet Take 1 tablet (5 mg total) by mouth 2 (two) times daily with a meal. 60 tablet 3   No current facility-administered medications for this visit.      Allergies:  Allergies  Allergen Reactions  . Aspirin Hives    Past Medical History, Surgical history, Social history, and Family History were reviewed and updated.    Physical Exam: Blood pressure 140/79, pulse 72, temperature 98.2 F (36.8 C), temperature source Oral, resp. rate 18, height 5\' 3"  (1.6 m), weight 141 lb (64 kg), SpO2 100 %. ECOG: 1 General appearance: Alert, awake gentleman without distress. Head: Normocephalic, without obvious abnormality. No oral ulcers or lesions. Neck: no adenopathy no thyroid masses. Lymph nodes: Cervical, supraclavicular, and axillary nodes normal. Heart:regular rate and rhythm, S1, S2 normal, no murmur, click, rub or gallop  Chest wall examination: Port-A-Cath site appeared without erythema or induration. Lung:chest clear, no wheezing, rales, normal symmetric air entry Abdomin: soft, non-tender, without masses or organomegaly. No shifting dullness or ascites. EXT: no edema noted Skin: dry skin noted without  any erythema or induration.    Lab Results: Lab Results  Component Value Date   WBC 5.7 10/14/2016   HGB 12.4 (L) 10/14/2016   HCT 37.2 (L) 10/14/2016   MCV 82.3 10/14/2016   PLT 226 10/14/2016     Chemistry      Component Value Date/Time   NA  140 10/14/2016 1153   K 3.7 10/14/2016 1153   CL 102 04/15/2015 2219   CO2 26 10/14/2016 1153   BUN 11.9 10/14/2016 1153   CREATININE 0.7 10/14/2016 1153      Component Value Date/Time   CALCIUM 9.4 10/14/2016 1153   ALKPHOS 106 10/14/2016 1153   AST 11 10/14/2016 1153   ALT 15 10/14/2016 1153   BILITOT 0.37 10/14/2016 1153        Results for [name], [name] (MRN [id number]) as of 10/14/2016 13:18  Ref. Range 08/04/2016 10:58 09/10/2016 12:45  PSA Latest Ref Range: 0.0 - 4.0 ng/mL 8.9 (H) 6.0 (H)      Impression and Plan:  65 year old gentleman with the following issues:  1. Prostate cancer diagnosed in June 2014. He presented with obstructive symptoms and found to have a Gleason score 4+4 = 8 prostate cancer with PSA of around 40. He was treated with androgen deprivation and radiation therapy with an excellent initial response and a PSA nadir of 0.33. PSA was up to 19 and Casodex was added.  He developed progression of disease with PSA up to 222 with liver metastasis.  He is status post Taxotere chemotherapy with an excellent response by PSA criteria as well as CT scan.   CT scan done on 04/07/2016 continues to show decrease in his hepatic metastasis.  His PSA increased up to 8.9 Started Zytiga with prednisone in September 2017. Since his last visit, he reports no major complications. I recommended continuing the same dose and schedule and repeat his PSA on a monthly basis.  2. Liver masses: biopsy-proven to be prostate cancer CT scan on 04/07/2016 showed continued response and decrease in the size.  3.  IV access: Port-A-Cath maintained without complications. This will be flushed every 8 weeks.   4. Lower extremity edema: Likely related to Taxotere. This has resolved.  5. Prognosis:  he had an excellent response to chemotherapy and continues to have excellent performance status. I recommend continuing aggressive therapy.  6. Follow-up: Will be in 4 weeks to assess his  clinical status.    [id number], MD 10/25/20171:16 PM

## 2016-10-14 NOTE — Telephone Encounter (Signed)
Appointments scheduled per 10/25 LOS. AVS report given and calendar of future appointments.

## 2016-10-15 LAB — PSA: PROSTATE SPECIFIC AG, SERUM: 6.8 ng/mL — AB (ref 0.0–4.0)

## 2016-11-06 ENCOUNTER — Other Ambulatory Visit: Payer: Self-pay | Admitting: Oncology

## 2016-11-06 MED FILL — ZYTIGA 250 MG TABLET: 250 | 30 days supply | Qty: 120 | Fill #0

## 2016-11-20 ENCOUNTER — Ambulatory Visit (HOSPITAL_BASED_OUTPATIENT_CLINIC_OR_DEPARTMENT_OTHER): Payer: PPO | Admitting: Oncology

## 2016-11-20 ENCOUNTER — Telehealth: Payer: Self-pay | Admitting: Oncology

## 2016-11-20 ENCOUNTER — Other Ambulatory Visit (HOSPITAL_BASED_OUTPATIENT_CLINIC_OR_DEPARTMENT_OTHER): Payer: PPO

## 2016-11-20 VITALS — BP 129/72 | HR 78 | Temp 98.1°F | Resp 17 | Ht 63.0 in | Wt 143.5 lb

## 2016-11-20 DIAGNOSIS — C61 Malignant neoplasm of prostate: Secondary | ICD-10-CM

## 2016-11-20 DIAGNOSIS — E876 Hypokalemia: Secondary | ICD-10-CM

## 2016-11-20 DIAGNOSIS — C787 Secondary malignant neoplasm of liver and intrahepatic bile duct: Secondary | ICD-10-CM

## 2016-11-20 LAB — CBC WITH DIFFERENTIAL/PLATELET
BASO%: 0.9 % (ref 0.0–2.0)
BASOS ABS: 0 10*3/uL (ref 0.0–0.1)
EOS%: 2.3 % (ref 0.0–7.0)
Eosinophils Absolute: 0.1 10*3/uL (ref 0.0–0.5)
HEMATOCRIT: 36.1 % — AB (ref 38.4–49.9)
HGB: 12 g/dL — ABNORMAL LOW (ref 13.0–17.1)
LYMPH#: 1 10*3/uL (ref 0.9–3.3)
LYMPH%: 21.2 % (ref 14.0–49.0)
MCH: 27.4 pg (ref 27.2–33.4)
MCHC: 33.3 g/dL (ref 32.0–36.0)
MCV: 82.2 fL (ref 79.3–98.0)
MONO#: 0.3 10*3/uL (ref 0.1–0.9)
MONO%: 7.4 % (ref 0.0–14.0)
NEUT#: 3.1 10*3/uL (ref 1.5–6.5)
NEUT%: 68.2 % (ref 39.0–75.0)
PLATELETS: 210 10*3/uL (ref 140–400)
RBC: 4.39 10*6/uL (ref 4.20–5.82)
RDW: 13.3 % (ref 11.0–14.6)
WBC: 4.6 10*3/uL (ref 4.0–10.3)

## 2016-11-20 LAB — COMPREHENSIVE METABOLIC PANEL
ALT: 19 U/L (ref 0–55)
ANION GAP: 11 meq/L (ref 3–11)
AST: 13 U/L (ref 5–34)
Albumin: 3.3 g/dL — ABNORMAL LOW (ref 3.5–5.0)
Alkaline Phosphatase: 91 U/L (ref 40–150)
BUN: 12.7 mg/dL (ref 7.0–26.0)
CALCIUM: 9.4 mg/dL (ref 8.4–10.4)
CHLORIDE: 106 meq/L (ref 98–109)
CO2: 27 meq/L (ref 22–29)
Creatinine: 0.9 mg/dL (ref 0.7–1.3)
EGFR: 89 mL/min/{1.73_m2} — AB (ref >90)
Glucose: 112 mg/dl (ref 70–140)
POTASSIUM: 3.3 meq/L — AB (ref 3.5–5.1)
Sodium: 144 mEq/L (ref 136–145)
Total Bilirubin: 0.56 mg/dL (ref 0.20–1.20)
Total Protein: 7.3 g/dL (ref 6.4–8.3)

## 2016-11-20 NOTE — Telephone Encounter (Signed)
Appointments scheduled per 12/1 LOS. Patient given AVS report and calendars with future scheduled appointments. °

## 2016-11-20 NOTE — Progress Notes (Signed)
Hematology and Oncology Follow Up Visit  [name] Johnston [id number] Aug 17, 1951 65 y.o. 11/20/2016 1:17 PM [name],[name], MDDewey, [name], MD   Principle Diagnosis: 65 year old gentleman with prostate cancer diagnosed in 2014. He presented with obstructive symptoms and found to have a Gleason score 4+4 = 8 prostate cancer with PSA of around 40.  Liver mass diagnosed in December 2015 with an elevated AFP of 9.3. Biopsy proven to be prostate cancer.  Prior Therapy:  He is started [place] on 06/27/2013. He subsequently received definitive radiation therapy for a planned dose of 75 gray completed in November 2014 under the care of Dr. Tammi Klippel.  His PSA subsequently noted close to 0.33. PSA was up to 11 on 10/29/2014 with a castrate levels of testosterone of 19.   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  Docetaxel 75 mg/m given every 3 weeks with Neulasta support. He is status post 10 cycles of treatment concluded on 11/21/2015. He had an excellent PSA response with PSA dropping from 222 to 0.17.   Current therapy: Zytiga 1000 mg with prednisone since September 2017.   Interim History:  [name] Johnston presents today for a follow-up visit with an interpreter. Since his last visit, he continues to do very well without any major changes. He started Zytiga with prednisone and have tolerated it reasonably well.  His appetite improved and have actually gained weight. His performance status and activity level not dramatically changed. He denies any lower extremity edema, nausea or excessive fatigue. He denied peripheral neuropathy and his lower extremity edema resolved at this time.  He reports no abdominal pain or arthralgias. He does not report any pathological fractures or hospitalization.   He does not report any headaches or blurry vision or syncope. He does not report any fevers or chills.  He does not report any frequency urgency but does  report dysuria and occasional hematuria. He does not report any skeletal complaints of back pain shoulder pain or hip pain. He does not report any lymphadenopathy or petechiae. Remainder of his review of systems unremarkable.   Medications: I have reviewed the patient's current medications.  Current Outpatient Prescriptions  Medication Sig Dispense Refill  . predniSONE (DELTASONE) 5 MG tablet Take 1 tablet (5 mg total) by mouth 2 (two) times daily with a meal. 60 tablet 3  . ZYTIGA 250 MG tablet TAKE 4 TABLETS BY MOUTH DAILY. TAKE ON AN EMPTY STOMACH 1 HOUR BEFORE OR 2 HOURS AFTER A MEAL 120 tablet 0   No current facility-administered medications for this visit.      Allergies:  Allergies  Allergen Reactions  . Aspirin Hives    Past Medical History, Surgical history, Social history, and Family History were reviewed and updated.    Physical Exam: Blood pressure 129/72, pulse 78, temperature 98.1 F (36.7 C), temperature source Oral, resp. rate 17, height 5\' 3"  (1.6 m), weight 143 lb 8 oz (65.1 kg), SpO2 99 %. ECOG: 1 General appearance: Well-appearing gentleman without distress. Head: Normocephalic, without obvious abnormality. No oral thrush noted. Neck: no adenopathy no thyroid masses. Lymph nodes: Cervical, supraclavicular, and axillary nodes normal. Heart:regular rate and rhythm, S1, S2 normal, no murmur, click, rub or gallop  Chest wall examination: Port-A-Cath site appeared without erythema or induration. Lung:chest clear, no wheezing, rales, normal symmetric air entry Abdomin: soft, non-tender, without masses or organomegaly. No rebound or guarding. EXT: no edema noted Skin: No ecchymosis or petechiae.  Lab Results: Lab Results  Component Value  Date   WBC 4.6 11/20/2016   HGB 12.0 (L) 11/20/2016   HCT 36.1 (L) 11/20/2016   MCV 82.2 11/20/2016   PLT 210 11/20/2016     Chemistry      Component Value Date/Time   NA 144 11/20/2016 1233   K 3.3 (L) 11/20/2016 1233    CL 102 04/15/2015 2219   CO2 27 11/20/2016 1233   BUN 12.7 11/20/2016 1233   CREATININE 0.9 11/20/2016 1233      Component Value Date/Time   CALCIUM 9.4 11/20/2016 1233   ALKPHOS 91 11/20/2016 1233   AST 13 11/20/2016 1233   ALT 19 11/20/2016 1233   BILITOT 0.56 11/20/2016 1233    Results for [name], [name] Johnston (MRN [id number]) as of 11/20/2016 12:51  Ref. Range 09/10/2016 12:45 10/14/2016 11:53  PSA Latest Ref Range: 0.0 - 4.0 ng/mL 6.0 (H) 6.8 (H)         Impression and Plan:  65 year old gentleman with the following issues:  1. Prostate cancer diagnosed in June 2014. He presented with obstructive symptoms and found to have a Gleason score 4+4 = 8 prostate cancer with PSA of around 40. He was treated with androgen deprivation and radiation therapy with an excellent initial response and a PSA nadir of 0.33. PSA was up to 19 and Casodex was added.  He developed progression of disease with PSA up to 222 with liver metastasis.  He is status post Taxotere chemotherapy with an excellent response by PSA criteria as well as CT scan.   CT scan done on 04/07/2016 continues to show decrease in his hepatic metastasis.  His PSA increased up to 8.9 Started Zytiga with prednisone in September 2017. His PSA remains relatively stable around 6.8 without any major complications. I recommended continuing the same dose and schedule and continuing monitoring his PSA.  2. Liver masses: biopsy-proven to be prostate cancer CT scan on 04/07/2016 showed continued response and decrease in the size.  3.  IV access: Port-A-Cath maintained without complications. This will be flushed with the next visit.   4. Hypokalemia: Mild and related data to Zytiga. I have asked him to increase his potassium rich diet.  5. Prognosis:  he had an excellent response to chemotherapy and continues to have excellent performance status. I continue to recommend aggressive therapy at this time.  6. Follow-up: Will be in 5 weeks  to assess his clinical status.    [id number], MD 12/1/20171:17 PM

## 2016-11-21 LAB — PSA: PROSTATE SPECIFIC AG, SERUM: 6.6 ng/mL — AB (ref 0.0–4.0)

## 2016-12-18 ENCOUNTER — Other Ambulatory Visit: Payer: Self-pay | Admitting: Nurse Practitioner

## 2016-12-25 ENCOUNTER — Other Ambulatory Visit: Payer: Self-pay | Admitting: *Deleted

## 2016-12-25 ENCOUNTER — Other Ambulatory Visit: Payer: Self-pay | Admitting: Oncology

## 2016-12-25 ENCOUNTER — Other Ambulatory Visit (HOSPITAL_BASED_OUTPATIENT_CLINIC_OR_DEPARTMENT_OTHER): Payer: PPO

## 2016-12-25 ENCOUNTER — Telehealth: Payer: Self-pay | Admitting: Oncology

## 2016-12-25 ENCOUNTER — Ambulatory Visit (HOSPITAL_BASED_OUTPATIENT_CLINIC_OR_DEPARTMENT_OTHER): Payer: PPO

## 2016-12-25 ENCOUNTER — Ambulatory Visit (HOSPITAL_BASED_OUTPATIENT_CLINIC_OR_DEPARTMENT_OTHER): Payer: PPO | Admitting: Oncology

## 2016-12-25 VITALS — BP 124/73 | HR 91 | Temp 97.7°F | Resp 16 | Ht 63.0 in | Wt 140.5 lb

## 2016-12-25 DIAGNOSIS — C787 Secondary malignant neoplasm of liver and intrahepatic bile duct: Secondary | ICD-10-CM

## 2016-12-25 DIAGNOSIS — C61 Malignant neoplasm of prostate: Secondary | ICD-10-CM | POA: Diagnosis not present

## 2016-12-25 DIAGNOSIS — E876 Hypokalemia: Secondary | ICD-10-CM | POA: Diagnosis not present

## 2016-12-25 DIAGNOSIS — R300 Dysuria: Secondary | ICD-10-CM | POA: Diagnosis not present

## 2016-12-25 LAB — CBC WITH DIFFERENTIAL/PLATELET
BASO%: 0.3 % (ref 0.0–2.0)
Basophils Absolute: 0 10*3/uL (ref 0.0–0.1)
EOS%: 3.3 % (ref 0.0–7.0)
Eosinophils Absolute: 0.2 10*3/uL (ref 0.0–0.5)
HCT: 36 % — ABNORMAL LOW (ref 38.4–49.9)
HGB: 12.4 g/dL — ABNORMAL LOW (ref 13.0–17.1)
LYMPH%: 18.3 % (ref 14.0–49.0)
MCH: 28.2 pg (ref 27.2–33.4)
MCHC: 34.4 g/dL (ref 32.0–36.0)
MCV: 81.8 fL (ref 79.3–98.0)
MONO#: 0.5 10*3/uL (ref 0.1–0.9)
MONO%: 8.1 % (ref 0.0–14.0)
NEUT#: 4.2 10*3/uL (ref 1.5–6.5)
NEUT%: 70 % (ref 39.0–75.0)
Platelets: 216 10*3/uL (ref 140–400)
RBC: 4.4 10*6/uL (ref 4.20–5.82)
RDW: 12.7 % (ref 11.0–14.6)
WBC: 6 10*3/uL (ref 4.0–10.3)
lymph#: 1.1 10*3/uL (ref 0.9–3.3)

## 2016-12-25 LAB — COMPREHENSIVE METABOLIC PANEL WITH GFR
ALT: 15 U/L (ref 0–55)
AST: 14 U/L (ref 5–34)
Albumin: 3.7 g/dL (ref 3.5–5.0)
Alkaline Phosphatase: 106 U/L (ref 40–150)
Anion Gap: 9 meq/L (ref 3–11)
BUN: 9.4 mg/dL (ref 7.0–26.0)
CO2: 28 meq/L (ref 22–29)
Calcium: 9.7 mg/dL (ref 8.4–10.4)
Chloride: 103 meq/L (ref 98–109)
Creatinine: 0.8 mg/dL (ref 0.7–1.3)
EGFR: >90 mL/min/{1.73_m2}
Glucose: 115 mg/dL (ref 70–140)
Potassium: 3.5 meq/L (ref 3.5–5.1)
Sodium: 140 meq/L (ref 136–145)
Total Bilirubin: 0.56 mg/dL (ref 0.20–1.20)
Total Protein: 7.5 g/dL (ref 6.4–8.3)

## 2016-12-25 MED ORDER — ABIRATERONE ACETATE 250 MG PO TABS: 1000.0000 mg | ORAL_TABLET | Freq: Every day | ORAL | 0 refills | Status: DC

## 2016-12-25 MED ORDER — SODIUM CHLORIDE 0.9% FLUSH
10.0000 mL | Freq: Once | INTRAVENOUS | Status: AC
  Administered 2016-12-25: 10 mL via INTRAVENOUS
  Filled 2016-12-25: qty 10

## 2016-12-25 MED ORDER — SODIUM CHLORIDE 0.9% FLUSH
10.0000 mL | INTRAVENOUS | Status: DC | PRN
  Filled 2016-12-25: qty 10

## 2016-12-25 MED ORDER — HEPARIN SOD (PORK) LOCK FLUSH 100 UNIT/ML IV SOLN
500.0000 [IU] | Freq: Once | INTRAVENOUS | Status: AC
  Administered 2016-12-25: 500 [IU] via INTRAVENOUS
  Filled 2016-12-25: qty 5

## 2016-12-25 MED ORDER — PREDNISONE 5 MG PO TABS: 5.0000 mg | ORAL_TABLET | Freq: Two times a day (BID) | ORAL | 3 refills | Status: DC

## 2016-12-25 MED FILL — ZYTIGA 250 MG TABLET: 250 | 30 days supply | Qty: 120 | Fill #0

## 2016-12-25 MED FILL — predniSONE 5 MG TABS: 5 | 30 days supply | Qty: 60 | Fill #0

## 2016-12-25 NOTE — Patient Instructions (Signed)

## 2016-12-25 NOTE — Telephone Encounter (Signed)
Appointments scheduled per 1/5 LOS. Patient given AVS report and calendars with future scheduled appointments. Patient given two bottles of contrast for CT scan.

## 2016-12-25 NOTE — Progress Notes (Signed)
Hematology and Oncology Follow Up Visit  Jermani Latimer [id number] [birth year] 66 y.o. 12/25/2016 12:37 PM [name],[name], MDDewey, [name], MD   Principle Diagnosis: 66 year old gentleman with prostate cancer diagnosed in 2014. He presented with obstructive symptoms and found to have a Gleason score 4+4 = 8 prostate cancer with PSA of around 40.  Liver mass diagnosed in December 2015 with an elevated AFP of 9.3. Biopsy proven to be prostate cancer.  Prior Therapy:  He is started [place] on 06/27/2013. He subsequently received definitive radiation therapy for a planned dose of 75 gray completed in November 2014 under the care of Dr. Tammi Klippel.  His PSA subsequently noted close to 0.33. PSA was up to 11 on 10/29/2014 with a castrate levels of testosterone of 19.   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  Docetaxel 75 mg/m given every 3 weeks with Neulasta support. He is status post 10 cycles of treatment concluded on 11/21/2015. He had an excellent PSA response with PSA dropping from 222 to 0.17.   Current therapy: Zytiga 1000 mg with prednisone since September 2017.   Interim History:  Mr. Clyatt presents today for a follow-up visit with an interpreter. Since his last visit, he reports no major changes in his health. He continues to take Zytiga without any major complications. His performance status and activity level not dramatically changed. He denies any lower extremity edema, nausea or excessive fatigue. He denied peripheral neuropathy and his lower extremity edema resolved at this time.  He reports no abdominal pain or arthralgias. He did report that he ran out of his medication the last 2 weeks however.  He does not report any headaches or blurry vision or syncope. He does not report any fevers or chills.  He does not report any frequency urgency but does report dysuria and occasional hematuria. He does not report any skeletal  complaints of back pain shoulder pain or hip pain. He does not report any lymphadenopathy or petechiae. Remainder of his review of systems unremarkable.   Medications: I have reviewed the patient's current medications.  Current Outpatient Prescriptions  Medication Sig Dispense Refill  . abiraterone Acetate (ZYTIGA) 250 MG tablet Take 4 tablets (1,000 mg total) by mouth daily. Take on an empty stomach 1 hour before or 2 hours after a meal 120 tablet 0  . predniSONE (DELTASONE) 5 MG tablet Take 1 tablet (5 mg total) by mouth 2 (two) times daily with a meal. 60 tablet 3   No current facility-administered medications for this visit.      Allergies:  Allergies  Allergen Reactions  . Aspirin Hives    Past Medical History, Surgical history, Social history, and Family History were reviewed and updated.    Physical Exam: Blood pressure 124/73, pulse 91, temperature 97.7 F (36.5 C), temperature source Oral, resp. rate 16, height 5\' 3"  (1.6 m), weight 140 lb 8 oz (63.7 kg), SpO2 100 %. ECOG: 1 General appearance: Alert, awake gentleman without distress. Head: Normocephalic, without obvious abnormality. No oral thrush noted. Neck: no adenopathy no thyroid masses. Lymph nodes: Cervical, supraclavicular, and axillary nodes normal. Heart:regular rate and rhythm, S1, S2 normal, no murmur, click, rub or gallop  Chest wall examination: Port-A-Cath site appeared without erythema or induration. Lung:chest clear, no wheezing, rales, normal symmetric air entry Abdomin: soft, non-tender, without masses or organomegaly. No rebound or guarding. No shifting dullness. EXT: no edema noted Skin: No ecchymosis or petechiae.  Lab Results: Lab Results  Component  Value Date   WBC 6.0 12/25/2016   HGB 12.4 (L) 12/25/2016   HCT 36.0 (L) 12/25/2016   MCV 81.8 12/25/2016   PLT 216 12/25/2016     Chemistry      Component Value Date/Time   NA 140 12/25/2016 1111   K 3.5 12/25/2016 1111   CL 102  04/15/2015 2219   CO2 28 12/25/2016 1111   BUN 9.4 12/25/2016 1111   CREATININE 0.8 12/25/2016 1111      Component Value Date/Time   CALCIUM 9.7 12/25/2016 1111   ALKPHOS 106 12/25/2016 1111   AST 14 12/25/2016 1111   ALT 15 12/25/2016 1111   BILITOT 0.56 12/25/2016 1111        Results for KHARSON, FRANZEL (MRN [id number]) as of 12/25/2016 12:09  Ref. Range 09/10/2016 12:45 10/14/2016 11:53 11/20/2016 12:33  PSA Latest Ref Range: 0.0 - 4.0 ng/mL 6.0 (H) 6.8 (H) 6.6 (H)        Impression and Plan:  66 year old gentleman with the following issues:  1. Prostate cancer diagnosed in June 2014. He presented with obstructive symptoms and found to have a Gleason score 4+4 = 8 prostate cancer with PSA of around 40. He was treated with androgen deprivation and radiation therapy with an excellent initial response and a PSA nadir of 0.33. PSA was up to 19 and Casodex was added.  He developed progression of disease with PSA up to 222 with liver metastasis.  He is status post Taxotere chemotherapy with an excellent response by PSA criteria as well as CT scan.   CT scan done on 04/07/2016 continues to show decrease in his hepatic metastasis.  His PSA increased up to 8.9 Started Zytiga with prednisone in September 2017.   His PSA should reasonable response declining to 6.6 in the last few months. He has been erratic in adhering to this medication due to availability. We have refilled this medication and it is unclear instructions on how to use it. The plan is to repeat imaging studies before the next visit.  2. Liver masses: biopsy-proven to be prostate cancer CT scan on 04/07/2016 showed continued response and decrease in the size. CT scan will be repeated in February 2018.  3.  IV access: Port-A-Cath maintained without complications. This will be flushed with the next visit.  4. Hypokalemia: Potassium replaced adequately at this time. This was repeated today and will be repeated  periodically.  5. Prognosis:  he had an excellent response to chemotherapy and continues to have excellent performance status. I continue to recommend aggressive therapy at this time.  6. Follow-up: Will be in 6 weeks to assess his clinical status.    Zola Button, MD 1/5/201812:37 PM

## 2016-12-26 LAB — PSA: Prostate Specific Ag, Serum: 9.1 ng/mL — ABNORMAL HIGH (ref 0.0–4.0)

## 2016-12-28 DIAGNOSIS — C61 Malignant neoplasm of prostate: Secondary | ICD-10-CM | POA: Diagnosis not present

## 2017-01-22 ENCOUNTER — Encounter: Payer: Self-pay | Admitting: *Deleted

## 2017-01-22 ENCOUNTER — Other Ambulatory Visit: Payer: Self-pay | Admitting: Oncology

## 2017-01-22 ENCOUNTER — Other Ambulatory Visit: Payer: Self-pay | Admitting: *Deleted

## 2017-01-22 MED ORDER — ABIRATERONE ACETATE 250 MG PO TABS: ORAL_TABLET | ORAL | 0 refills | Status: DC

## 2017-01-22 MED FILL — ZYTIGA 250 MG TABLET: 250 | 30 days supply | Qty: 120 | Fill #0

## 2017-02-01 MED FILL — predniSONE 5 MG TABS: 5 | 30 days supply | Qty: 60 | Fill #1

## 2017-02-05 ENCOUNTER — Ambulatory Visit (HOSPITAL_BASED_OUTPATIENT_CLINIC_OR_DEPARTMENT_OTHER): Payer: PPO

## 2017-02-05 ENCOUNTER — Other Ambulatory Visit (HOSPITAL_BASED_OUTPATIENT_CLINIC_OR_DEPARTMENT_OTHER): Payer: PPO

## 2017-02-05 ENCOUNTER — Ambulatory Visit (HOSPITAL_COMMUNITY): Payer: PPO

## 2017-02-05 DIAGNOSIS — C61 Malignant neoplasm of prostate: Secondary | ICD-10-CM | POA: Diagnosis not present

## 2017-02-05 DIAGNOSIS — Z95828 Presence of other vascular implants and grafts: Secondary | ICD-10-CM

## 2017-02-05 LAB — COMPREHENSIVE METABOLIC PANEL
ALBUMIN: 3.7 g/dL (ref 3.5–5.0)
ALK PHOS: 108 U/L (ref 40–150)
ALT: 16 U/L (ref 0–55)
AST: 13 U/L (ref 5–34)
Anion Gap: 10 mEq/L (ref 3–11)
BILIRUBIN TOTAL: 0.86 mg/dL (ref 0.20–1.20)
BUN: 10.6 mg/dL (ref 7.0–26.0)
CO2: 23 meq/L (ref 22–29)
CREATININE: 0.8 mg/dL (ref 0.7–1.3)
Calcium: 9.4 mg/dL (ref 8.4–10.4)
Chloride: 107 mEq/L (ref 98–109)
GLUCOSE: 98 mg/dL (ref 70–140)
Potassium: 3.9 mEq/L (ref 3.5–5.1)
SODIUM: 140 meq/L (ref 136–145)
TOTAL PROTEIN: 7.6 g/dL (ref 6.4–8.3)

## 2017-02-05 LAB — CBC WITH DIFFERENTIAL/PLATELET
BASO%: 0.6 % (ref 0.0–2.0)
Basophils Absolute: 0 10*3/uL (ref 0.0–0.1)
EOS ABS: 0.1 10*3/uL (ref 0.0–0.5)
EOS%: 2.3 % (ref 0.0–7.0)
HCT: 35.7 % — ABNORMAL LOW (ref 38.4–49.9)
HEMOGLOBIN: 12.1 g/dL — AB (ref 13.0–17.1)
LYMPH%: 15.5 % (ref 14.0–49.0)
MCH: 27.7 pg (ref 27.2–33.4)
MCHC: 33.9 g/dL (ref 32.0–36.0)
MCV: 81.7 fL (ref 79.3–98.0)
MONO#: 0.3 10*3/uL (ref 0.1–0.9)
MONO%: 5.4 % (ref 0.0–14.0)
NEUT%: 76.2 % — ABNORMAL HIGH (ref 39.0–75.0)
NEUTROS ABS: 3.9 10*3/uL (ref 1.5–6.5)
Platelets: 201 10*3/uL (ref 140–400)
RBC: 4.37 10*6/uL (ref 4.20–5.82)
RDW: 13 % (ref 11.0–14.6)
WBC: 5.2 10*3/uL (ref 4.0–10.3)
lymph#: 0.8 10*3/uL — ABNORMAL LOW (ref 0.9–3.3)

## 2017-02-05 MED ORDER — HEPARIN SOD (PORK) LOCK FLUSH 100 UNIT/ML IV SOLN
500.0000 [IU] | Freq: Once | INTRAVENOUS | Status: AC
  Administered 2017-02-05: 500 [IU]
  Filled 2017-02-05: qty 5

## 2017-02-05 MED ORDER — SODIUM CHLORIDE 0.9% FLUSH
10.0000 mL | Freq: Once | INTRAVENOUS | Status: AC
  Administered 2017-02-05: 10 mL
  Filled 2017-02-05: qty 10

## 2017-02-06 LAB — PSA: PROSTATE SPECIFIC AG, SERUM: 6.1 ng/mL — AB (ref 0.0–4.0)

## 2017-02-09 ENCOUNTER — Ambulatory Visit (HOSPITAL_COMMUNITY)
Admission: RE | Admit: 2017-02-09 | Discharge: 2017-02-09 | Disposition: A | Discharge status: Home / Self Care | Payer: PPO | Source: Ambulatory Visit | Attending: Oncology | Admitting: Oncology

## 2017-02-09 ENCOUNTER — Ambulatory Visit (HOSPITAL_BASED_OUTPATIENT_CLINIC_OR_DEPARTMENT_OTHER): Payer: PPO | Admitting: Oncology

## 2017-02-09 ENCOUNTER — Telehealth: Payer: Self-pay | Admitting: Oncology

## 2017-02-09 VITALS — BP 142/70 | HR 77 | Temp 98.4°F | Resp 18 | Ht 63.0 in | Wt 143.8 lb

## 2017-02-09 DIAGNOSIS — C787 Secondary malignant neoplasm of liver and intrahepatic bile duct: Secondary | ICD-10-CM

## 2017-02-09 DIAGNOSIS — N433 Hydrocele, unspecified: Secondary | ICD-10-CM | POA: Insufficient documentation

## 2017-02-09 DIAGNOSIS — E876 Hypokalemia: Secondary | ICD-10-CM

## 2017-02-09 DIAGNOSIS — R918 Other nonspecific abnormal finding of lung field: Secondary | ICD-10-CM | POA: Diagnosis not present

## 2017-02-09 DIAGNOSIS — C61 Malignant neoplasm of prostate: Secondary | ICD-10-CM

## 2017-02-09 DIAGNOSIS — Z8546 Personal history of malignant neoplasm of prostate: Secondary | ICD-10-CM | POA: Insufficient documentation

## 2017-02-09 DIAGNOSIS — I700 Atherosclerosis of aorta: Secondary | ICD-10-CM | POA: Diagnosis not present

## 2017-02-09 DIAGNOSIS — R935 Abnormal findings on diagnostic imaging of other abdominal regions, including retroperitoneum: Secondary | ICD-10-CM | POA: Diagnosis not present

## 2017-02-09 MED ORDER — HEPARIN SOD (PORK) LOCK FLUSH 100 UNIT/ML IV SOLN
INTRAVENOUS | Status: AC
  Filled 2017-02-09: qty 5

## 2017-02-09 MED ORDER — SODIUM CHLORIDE 0.9 % IJ SOLN
INTRAMUSCULAR | Status: AC
  Filled 2017-02-09: qty 50

## 2017-02-09 MED ORDER — IOPAMIDOL (ISOVUE-300) INJECTION 61%
INTRAVENOUS | Status: AC
  Administered 2017-02-09: 100 mL
  Filled 2017-02-09: qty 100

## 2017-02-09 NOTE — Progress Notes (Signed)
Hematology and Oncology Follow Up Visit  [name] Johnston [id number] [birth year] 66 y.o. 02/09/2017 1:38 PM KevinJohnston, MDDewey, [name], MD   Principle Diagnosis: 66 year old gentleman with prostate cancer diagnosed in 2014. He presented with obstructive symptoms and found to have a Gleason score 4+4 = 8 prostate cancer with PSA of around 40.  Liver mass diagnosed in December 2015 with an elevated AFP of 9.3. Biopsy proven to be prostate cancer.  Prior Therapy:  He is started [place] on 06/27/2013. He subsequently received definitive radiation therapy for a planned dose of 75 gray completed in November 2014 under the care of Dr. Tammi Klippel.  His PSA subsequently noted close to 0.33. PSA was up to 11 on 10/29/2014 with a castrate levels of testosterone of 19.   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  Docetaxel 75 mg/m given every 3 weeks with Neulasta support. He is status post 10 cycles of treatment concluded on 11/21/2015. He had an excellent PSA response with PSA dropping from 222 to 0.17.   Current therapy: Zytiga 1000 mg with prednisone since September 2017.   Interim History:  [name] presents today for a follow-up visit with an interpreter and his daughter. Since his last visit, he reports no recent complaints. He continues to enjoy a reasonable quality of life without any decline. He continues to take Zytiga without any major complications. His performance status and activity level not dramatically changed. He denies any lower extremity edema, nausea or excessive fatigue. He reports he is adhering to this medication at this time much better.  He does not report any headaches or blurry vision or syncope. He does not report any fevers or chills.  He does not report any frequency urgency but does report dysuria and occasional hematuria. He does not report any skeletal complaints of back pain shoulder pain or hip pain. He does  not report any lymphadenopathy or petechiae. Remainder of his review of systems unremarkable.   Medications: I have reviewed the patient's current medications.  Current Outpatient Prescriptions  Medication Sig Dispense Refill  . abiraterone Acetate (ZYTIGA) 250 MG tablet TAKE 4 TABLETS BY MOUTH DAILY. TAKE ON AN EMPTY STOMACH 1 HOUR BEFORE OR 2 HOURS AFTER A MEAL 120 tablet 0  . predniSONE (DELTASONE) 5 MG tablet Take 1 tablet (5 mg total) by mouth 2 (two) times daily with a meal. 60 tablet 3  . ZYTIGA 250 MG tablet TAKE 4 TABLETS BY MOUTH DAILY. TAKE ON AN EMPTY STOMACH 1 HOUR BEFORE OR 2 HOURS AFTER A MEAL 120 tablet 0   No current facility-administered medications for this visit.    Facility-Administered Medications Ordered in Other Visits  Medication Dose Route Frequency Provider Last Rate Last Dose  . heparin lock flush 100 UNIT/ML injection           . sodium chloride 0.9 % injection              Allergies:  Allergies  Allergen Reactions  . Aspirin Hives    Past Medical History, Surgical history, Social history, and Family History were reviewed and updated.    Physical Exam: Blood pressure (!) 142/70, pulse 77, temperature 98.4 F (36.9 C), temperature source Oral, resp. rate 18, height 5\' 3"  (1.6 m), weight 143 lb 12.8 oz (65.2 kg), SpO2 100 %. ECOG: 1 General appearance: Well-appearing gentleman appeared without distress. Head: Normocephalic, without obvious abnormality. No oral ulcers or lesions. Neck: no adenopathy no thyroid masses. Lymph nodes: Cervical,  supraclavicular, and axillary nodes normal. Heart:regular rate and rhythm, S1, S2 normal, no murmur, click, rub or gallop  Chest wall examination: Port-A-Cath site appeared without erythema or induration. Lung:chest clear, no wheezing, rales, normal symmetric air entry Abdomin: soft, non-tender, without masses or organomegaly. No shifting dullness or ascites. EXT: no edema noted Skin: No ecchymosis or  petechiae.  Lab Results: Lab Results  Component Value Date   WBC 5.2 02/05/2017   HGB 12.1 (L) 02/05/2017   HCT 35.7 (L) 02/05/2017   MCV 81.7 02/05/2017   PLT 201 02/05/2017     Chemistry      Component Value Date/Time   NA 140 02/05/2017 1111   K 3.9 02/05/2017 1111   CL 102 04/15/2015 2219   CO2 23 02/05/2017 1111   BUN 10.6 02/05/2017 1111   CREATININE 0.8 02/05/2017 1111      Component Value Date/Time   CALCIUM 9.4 02/05/2017 1111   ALKPHOS 108 02/05/2017 1111   AST 13 02/05/2017 1111   ALT 16 02/05/2017 1111   BILITOT 0.86 02/05/2017 1111     IMPRESSION: 1. Progression of disease in terms of enlarging pulmonary nodules in the right lung, the largest of which now measures up to 1.6 x 1.4 cm in the right middle lobe. 2. Previously noted hepatic lesions continue to regress. No new signs of metastatic disease are otherwise noted elsewhere in the abdomen or pelvis. 3. Incompletely visualized left hemiscrotum appears to contain both a varicocele and a hydrocele. Further evaluation with nonemergent scrotal ultrasound is suggested. 4. Aortic atherosclerosis. 5. Additional incidental findings, as above.    Results for [name], Johnston (MRN [id number]) as of 02/09/2017 12:46  Ref. Range 12/25/2016 11:11 02/05/2017 11:11  PSA Latest Ref Range: 0.0 - 4.0 ng/mL 9.1 (H) 6.1 (H)        Impression and Plan:  66 year old gentleman with the following issues:  1. Prostate cancer diagnosed in June 2014. He presented with obstructive symptoms and found to have a Gleason score 4+4 = 8 prostate cancer with PSA of around 40. He was treated with androgen deprivation and radiation therapy with an excellent initial response and a PSA nadir of 0.33. PSA was up to 19 and Casodex was added.  He developed progression of disease with PSA up to 222 with liver metastasis.  He is status post Taxotere chemotherapy with an excellent response by PSA criteria as well as CT scan.   CT scan done on  04/07/2016 continues to show decrease in his hepatic metastasis.  His PSA increased up to 8.9 Started Zytiga with prednisone in September 2017.   CT scan obtained on 02/09/2017 was reviewed and discussed with the patient and his family. He appears to have regression of his hepatic metastasis. He does have base of the lung nodules which have slightly increased in size.  His PSA continues to be under reasonable control down to 6.1. The plan is to continue the same dose and schedule at this time as long as his disease is controlled.    2. Liver masses: biopsy-proven to be prostate cancer CT scan on 04/07/2016 showed continued response and decrease in the size. CT scan on 02/09/2017 showed continued regression of his liver masses.  3.  IV access: Port-A-Cath maintained without complications. This will be flushed with each visit.  4. Hypokalemia: Potassium replaced adequately at this time. This was repeated today and will be repeated periodically.  5. Prognosis: He does have an incurable malignancy but continues to respond very well  to systemic treatment. Plan is to continue with aggressive measures at this time.  6. Follow-up: Will be in 6 weeks to assess his clinical status.    Zola Button, MD 2/20/20181:38 PM

## 2017-02-09 NOTE — Telephone Encounter (Signed)
Appointments scheduled per 2/20 LOS. Lab and flush appointment scheduled per expected date for lab order. Patient given AVS report and calendars with future scheduled appointments.

## 2017-02-18 MED FILL — ZYTIGA 250 MG TABLET: 250 | 30 days supply | Qty: 120 | Fill #0

## 2017-02-25 DIAGNOSIS — Z6827 Body mass index (BMI) 27.0-27.9, adult: Secondary | ICD-10-CM | POA: Diagnosis not present

## 2017-02-25 DIAGNOSIS — H9193 Unspecified hearing loss, bilateral: Secondary | ICD-10-CM | POA: Diagnosis not present

## 2017-03-08 MED FILL — predniSONE 5 MG TABS: 5 | 30 days supply | Qty: 60 | Fill #2

## 2017-03-17 ENCOUNTER — Ambulatory Visit (HOSPITAL_BASED_OUTPATIENT_CLINIC_OR_DEPARTMENT_OTHER): Payer: PPO

## 2017-03-17 ENCOUNTER — Other Ambulatory Visit (HOSPITAL_BASED_OUTPATIENT_CLINIC_OR_DEPARTMENT_OTHER): Payer: PPO

## 2017-03-17 DIAGNOSIS — C61 Malignant neoplasm of prostate: Secondary | ICD-10-CM

## 2017-03-17 DIAGNOSIS — C787 Secondary malignant neoplasm of liver and intrahepatic bile duct: Secondary | ICD-10-CM

## 2017-03-17 DIAGNOSIS — Z95828 Presence of other vascular implants and grafts: Secondary | ICD-10-CM

## 2017-03-17 LAB — CBC WITH DIFFERENTIAL/PLATELET
BASO%: 0.6 % (ref 0.0–2.0)
Basophils Absolute: 0 10e3/uL (ref 0.0–0.1)
EOS%: 2 % (ref 0.0–7.0)
Eosinophils Absolute: 0.1 10e3/uL (ref 0.0–0.5)
HCT: 34.9 % — ABNORMAL LOW (ref 38.4–49.9)
HGB: 11.6 g/dL — ABNORMAL LOW (ref 13.0–17.1)
LYMPH%: 22 % (ref 14.0–49.0)
MCH: 27.7 pg (ref 27.2–33.4)
MCHC: 33.3 g/dL (ref 32.0–36.0)
MCV: 83.2 fL (ref 79.3–98.0)
MONO#: 0.4 10e3/uL (ref 0.1–0.9)
MONO%: 8.9 % (ref 0.0–14.0)
NEUT#: 3.1 10e3/uL (ref 1.5–6.5)
NEUT%: 66.5 % (ref 39.0–75.0)
Platelets: 219 10e3/uL (ref 140–400)
RBC: 4.2 10e6/uL (ref 4.20–5.82)
RDW: 13.5 % (ref 11.0–14.6)
WBC: 4.7 10e3/uL (ref 4.0–10.3)
lymph#: 1 10e3/uL (ref 0.9–3.3)

## 2017-03-17 LAB — COMPREHENSIVE METABOLIC PANEL WITH GFR
ALT: 15 U/L (ref 0–55)
AST: 11 U/L (ref 5–34)
Albumin: 3.5 g/dL (ref 3.5–5.0)
Alkaline Phosphatase: 104 U/L (ref 40–150)
Anion Gap: 8 meq/L (ref 3–11)
BUN: 12.1 mg/dL (ref 7.0–26.0)
CO2: 26 meq/L (ref 22–29)
Calcium: 8.9 mg/dL (ref 8.4–10.4)
Chloride: 107 meq/L (ref 98–109)
Creatinine: 0.8 mg/dL (ref 0.7–1.3)
EGFR: >90 ml/min/1.73 m2
Glucose: 94 mg/dL (ref 70–140)
Potassium: 3.4 meq/L — ABNORMAL LOW (ref 3.5–5.1)
Sodium: 141 meq/L (ref 136–145)
Total Bilirubin: 0.59 mg/dL (ref 0.20–1.20)
Total Protein: 7 g/dL (ref 6.4–8.3)

## 2017-03-17 MED ORDER — SODIUM CHLORIDE 0.9% FLUSH
10.0000 mL | Freq: Once | INTRAVENOUS | Status: AC
  Administered 2017-03-17: 10 mL
  Filled 2017-03-17: qty 10

## 2017-03-17 MED ORDER — HEPARIN SOD (PORK) LOCK FLUSH 100 UNIT/ML IV SOLN
500.0000 [IU] | Freq: Once | INTRAVENOUS | Status: AC
  Administered 2017-03-17: 500 [IU]
  Filled 2017-03-17: qty 5

## 2017-03-18 LAB — PSA: PROSTATE SPECIFIC AG, SERUM: 4.6 ng/mL — AB (ref 0.0–4.0)

## 2017-03-18 LAB — TESTOSTERONE

## 2017-03-19 ENCOUNTER — Ambulatory Visit (HOSPITAL_BASED_OUTPATIENT_CLINIC_OR_DEPARTMENT_OTHER): Payer: PPO | Admitting: Oncology

## 2017-03-19 ENCOUNTER — Telehealth: Payer: Self-pay | Admitting: Oncology

## 2017-03-19 VITALS — BP 135/78 | HR 77 | Temp 98.2°F | Resp 17 | Ht 63.0 in | Wt 143.8 lb

## 2017-03-19 DIAGNOSIS — E876 Hypokalemia: Secondary | ICD-10-CM

## 2017-03-19 DIAGNOSIS — C61 Malignant neoplasm of prostate: Secondary | ICD-10-CM | POA: Diagnosis not present

## 2017-03-19 DIAGNOSIS — C787 Secondary malignant neoplasm of liver and intrahepatic bile duct: Secondary | ICD-10-CM

## 2017-03-19 DIAGNOSIS — R918 Other nonspecific abnormal finding of lung field: Secondary | ICD-10-CM | POA: Diagnosis not present

## 2017-03-19 NOTE — Telephone Encounter (Signed)
Appointments scheduled per 3.30.18 LOS. Patient given AVS report and calendars with future scheduled appointments. °

## 2017-03-19 NOTE — Progress Notes (Signed)
Hematology and Oncology Follow Up Visit  Arav Bannister [id number] [birth year] 66 y.o. 03/19/2017 12:11 PM [name],[name], MDDewey, [name], MD   Principle Diagnosis: 33-year-old gentleman with prostate cancer diagnosed in 2014. He presented with obstructive symptoms and found to have a Gleason score 4+4 = 8 prostate cancer with PSA of around 40.  Liver mass diagnosed in December 2015 with an elevated AFP of 9.3. Biopsy proven to be prostate cancer.  Prior Therapy:  He is started [place] on 06/27/2013. He subsequently received definitive radiation therapy for a planned dose of 75 gray completed in November 2014 under the care of Dr. Tammi Klippel.  His PSA subsequently noted close to 0.33. PSA was up to 11 on 10/29/2014 with a castrate levels of testosterone of 19.   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  Docetaxel 75 mg/m given every 3 weeks with Neulasta support. He is status post 10 cycles of treatment concluded on 11/21/2015. He had an excellent PSA response with PSA dropping from 222 to 0.17.   Current therapy: Zytiga 1000 mg with prednisone since September 2017.   Interim History:  Mr. Bogdon presents today for a follow-up visit with an interpreter. Since his last visit, he reports no changes in his health. He continues to take Zytiga without any major complications. He denied any nausea, vomiting or lower extremity edema. He denied any excessive fatigue or tiredness. His performance status and activity level not dramatically changed. He continues to take this medication as prescribed without any difficulties. He denied any bone pain or pathological fractures.  He does not report any headaches or blurry vision or syncope. He does not report any fevers or chills.  He does not report any frequency urgency but does report dysuria and occasional hematuria. He does not report any skeletal complaints of back pain shoulder pain or hip pain.  He does not report any lymphadenopathy or petechiae. Remainder of his review of systems unremarkable.   Medications: I have reviewed the patient's current medications.  Current Outpatient Prescriptions  Medication Sig Dispense Refill  . abiraterone Acetate (ZYTIGA) 250 MG tablet TAKE 4 TABLETS BY MOUTH DAILY. TAKE ON AN EMPTY STOMACH 1 HOUR BEFORE OR 2 HOURS AFTER A MEAL 120 tablet 0  . predniSONE (DELTASONE) 5 MG tablet Take 1 tablet (5 mg total) by mouth 2 (two) times daily with a meal. 60 tablet 3  . ZYTIGA 250 MG tablet TAKE 4 TABLETS BY MOUTH DAILY. TAKE ON AN EMPTY STOMACH 1 HOUR BEFORE OR 2 HOURS AFTER A MEAL 120 tablet 0   No current facility-administered medications for this visit.      Allergies:  Allergies  Allergen Reactions  . Aspirin Hives    Past Medical History, Surgical history, Social history, and Family History were reviewed and updated.    Physical Exam: Blood pressure 135/78, pulse 77, temperature 98.2 F (36.8 C), temperature source Oral, resp. rate 17, height 5\' 3"  (1.6 m), weight 143 lb 12.8 oz (65.2 kg), SpO2 99 %. ECOG: 1 General appearance: Well-appearing gentleman without distress. Head: Normocephalic, without obvious abnormality. No oral thrush or ulcers. Neck: no adenopathy no thyroid masses. Lymph nodes: Cervical, supraclavicular, and axillary nodes normal. Heart:regular rate and rhythm, S1, S2 normal, no murmur, click, rub or gallop  Chest wall examination: Port-A-Cath site appeared without erythema or induration. Lung:chest clear, no wheezing, rales, normal symmetric air entry Abdomin: soft, non-tender, without masses or organomegaly. No shifting dullness or ascites. EXT: no edema noted  or masses noted. Skin: No ecchymosis or petechiae.  Lab Results: Lab Results  Component Value Date   WBC 4.7 03/17/2017   HGB 11.6 (L) 03/17/2017   HCT 34.9 (L) 03/17/2017   MCV 83.2 03/17/2017   PLT 219 03/17/2017     Chemistry      Component Value  Date/Time   NA 141 03/17/2017 1254   K 3.4 (L) 03/17/2017 1254   CL 102 04/15/2015 2219   CO2 26 03/17/2017 1254   BUN 12.1 03/17/2017 1254   CREATININE 0.8 03/17/2017 1254      Component Value Date/Time   CALCIUM 8.9 03/17/2017 1254   ALKPHOS 104 03/17/2017 1254   AST 11 03/17/2017 1254   ALT 15 03/17/2017 1254   BILITOT 0.59 03/17/2017 1254       Results for TAN, CLOPPER (MRN [id number]) as of 03/19/2017 11:58  Ref. Range 12/25/2016 11:11 02/05/2017 11:11 03/17/2017 12:54  PSA Latest Ref Range: 0.0 - 4.0 ng/mL 9.1 (H) 6.1 (H) 4.6 (H)       Impression and Plan:  66 year old gentleman with the following issues:  1. Prostate cancer diagnosed in June 2014. He presented with obstructive symptoms and found to have a Gleason score 4+4 = 8 prostate cancer with PSA of around 40. He was treated with androgen deprivation and radiation therapy with an excellent initial response and a PSA nadir of 0.33. PSA was up to 19 and Casodex was added.  He developed progression of disease with PSA up to 222 with liver metastasis.  He is status post Taxotere chemotherapy with an excellent response by PSA criteria as well as CT scan.   His PSA increased up to 8.9 Started Zytiga with prednisone in September 2017.   CT scan obtained on 02/09/2017 showed regression of his hepatic metastasis. He does have base of the lung nodules which have slightly increased in size.  His PSA continues to decline down to 4.6. Risks and benefits of continuing this medication were reviewed today and he is agreeable to continue. We will repeat imaging studies in 6 months.    2. Liver masses: biopsy-proven to be prostate cancer CT scan on 04/07/2016 showed continued response and decrease in the size. CT scan on 02/09/2017 showed continued regression of his liver masses.  3.  IV access: Port-A-Cath maintained without complications. This will be flushed with each visit.  4. Hypokalemia: Potassium remains within normal range.  He will continue potassium supplements as needed.  5. Prognosis: He does have an incurable malignancy but continues to respond very well to systemic treatment. Plan is to continue with aggressive treatment given his quality of life and performance status.  6. Follow-up: Will be in 6 weeks to assess his clinical status.    Zola Button, MD 3/30/201812:11 PM

## 2017-03-23 ENCOUNTER — Other Ambulatory Visit: Payer: Self-pay | Admitting: Oncology

## 2017-03-24 ENCOUNTER — Other Ambulatory Visit: Payer: Self-pay | Admitting: Oncology

## 2017-03-24 MED FILL — ZYTIGA 250 MG TABLET: 250 | 30 days supply | Qty: 120 | Fill #0

## 2017-03-25 ENCOUNTER — Other Ambulatory Visit: Payer: Self-pay | Admitting: *Deleted

## 2017-03-25 ENCOUNTER — Telehealth: Payer: Self-pay | Admitting: *Deleted

## 2017-03-25 NOTE — Telephone Encounter (Signed)
Returned daughter's call regarding Zytiga. Informed her that prescription was sent to [facility]. She verbalized understanding.

## 2017-03-25 NOTE — Telephone Encounter (Signed)
"  I'm calling for my father.  He received a call today. He was not able to understand what was said from whoever he spoke with.  Could I speak with them.  He has been trying to get a refill for Zytiga may be the reason for the call."  No call documentation at this time.  Refill was sent to [facility] on 03-23-2017.  Message left for collaborative to return call if nurse has placed a call.  Daughter will call pharmacy to learn if [name] order is ready for pick up.

## 2017-04-08 MED FILL — predniSONE 5 MG TABS: 5 | 30 days supply | Qty: 60 | Fill #3

## 2017-04-23 ENCOUNTER — Other Ambulatory Visit: Payer: Self-pay | Admitting: Oncology

## 2017-04-23 MED FILL — ZYTIGA 250 MG TABLET: 250 | 30 days supply | Qty: 120 | Fill #0

## 2017-05-04 ENCOUNTER — Other Ambulatory Visit (HOSPITAL_BASED_OUTPATIENT_CLINIC_OR_DEPARTMENT_OTHER): Payer: PPO

## 2017-05-04 ENCOUNTER — Ambulatory Visit: Payer: PPO

## 2017-05-04 DIAGNOSIS — C61 Malignant neoplasm of prostate: Secondary | ICD-10-CM | POA: Diagnosis not present

## 2017-05-04 DIAGNOSIS — Z95828 Presence of other vascular implants and grafts: Secondary | ICD-10-CM

## 2017-05-04 LAB — CBC WITH DIFFERENTIAL/PLATELET
BASO%: 0.4 % (ref 0.0–2.0)
BASOS ABS: 0 10*3/uL (ref 0.0–0.1)
EOS ABS: 0.1 10*3/uL (ref 0.0–0.5)
EOS%: 1.7 % (ref 0.0–7.0)
HEMATOCRIT: 35.7 % — AB (ref 38.4–49.9)
HEMOGLOBIN: 11.8 g/dL — AB (ref 13.0–17.1)
LYMPH#: 0.8 10*3/uL — AB (ref 0.9–3.3)
LYMPH%: 15.4 % (ref 14.0–49.0)
MCH: 27.7 pg (ref 27.2–33.4)
MCHC: 33 g/dL (ref 32.0–36.0)
MCV: 84 fL (ref 79.3–98.0)
MONO#: 0.3 10*3/uL (ref 0.1–0.9)
MONO%: 5.8 % (ref 0.0–14.0)
NEUT#: 4.1 10*3/uL (ref 1.5–6.5)
NEUT%: 76.7 % — ABNORMAL HIGH (ref 39.0–75.0)
Platelets: 232 10*3/uL (ref 140–400)
RBC: 4.26 10*6/uL (ref 4.20–5.82)
RDW: 13.4 % (ref 11.0–14.6)
WBC: 5.3 10*3/uL (ref 4.0–10.3)

## 2017-05-04 LAB — COMPREHENSIVE METABOLIC PANEL
ALBUMIN: 3.6 g/dL (ref 3.5–5.0)
ALK PHOS: 106 U/L (ref 40–150)
ALT: 16 U/L (ref 0–55)
ANION GAP: 11 meq/L (ref 3–11)
AST: 12 U/L (ref 5–34)
BUN: 10.2 mg/dL (ref 7.0–26.0)
CALCIUM: 9.3 mg/dL (ref 8.4–10.4)
CO2: 27 mEq/L (ref 22–29)
Chloride: 106 mEq/L (ref 98–109)
Creatinine: 0.8 mg/dL (ref 0.7–1.3)
Glucose: 112 mg/dl (ref 70–140)
POTASSIUM: 3.4 meq/L — AB (ref 3.5–5.1)
Sodium: 144 mEq/L (ref 136–145)
Total Bilirubin: 0.64 mg/dL (ref 0.20–1.20)
Total Protein: 7.4 g/dL (ref 6.4–8.3)

## 2017-05-04 MED ORDER — HEPARIN SOD (PORK) LOCK FLUSH 100 UNIT/ML IV SOLN
250.0000 [IU] | Freq: Once | INTRAVENOUS | Status: DC
  Filled 2017-05-04: qty 5

## 2017-05-04 MED ORDER — SODIUM CHLORIDE 0.9% FLUSH
10.0000 mL | Freq: Once | INTRAVENOUS | Status: AC
  Administered 2017-05-04: 10 mL
  Filled 2017-05-04: qty 10

## 2017-05-04 NOTE — Patient Instructions (Signed)

## 2017-05-05 DIAGNOSIS — C61 Malignant neoplasm of prostate: Secondary | ICD-10-CM | POA: Diagnosis not present

## 2017-05-05 DIAGNOSIS — R351 Nocturia: Secondary | ICD-10-CM | POA: Diagnosis not present

## 2017-05-05 LAB — PSA: Prostate Specific Ag, Serum: 6.2 ng/mL — ABNORMAL HIGH (ref 0.0–4.0)

## 2017-05-06 ENCOUNTER — Ambulatory Visit (HOSPITAL_BASED_OUTPATIENT_CLINIC_OR_DEPARTMENT_OTHER): Payer: PPO | Admitting: Oncology

## 2017-05-06 VITALS — BP 134/81 | HR 79 | Temp 98.4°F | Resp 18 | Ht 63.0 in | Wt 142.3 lb

## 2017-05-06 DIAGNOSIS — E876 Hypokalemia: Secondary | ICD-10-CM | POA: Diagnosis not present

## 2017-05-06 DIAGNOSIS — C61 Malignant neoplasm of prostate: Secondary | ICD-10-CM

## 2017-05-06 DIAGNOSIS — C787 Secondary malignant neoplasm of liver and intrahepatic bile duct: Secondary | ICD-10-CM | POA: Diagnosis not present

## 2017-05-06 NOTE — Progress Notes (Signed)
Hematology and Oncology Follow Up Visit  [name] Johnston [id number] May 13, 1951 66 y.o. 05/06/2017 12:02 PM [name], MDDewey, [name], MD   Principle Diagnosis: 66 year old gentleman with prostate cancer diagnosed in 2014. He presented with obstructive symptoms and found to have a Gleason score 4+4 = 8 prostate cancer with PSA of around 40.  Liver mass diagnosed in December 2015 with an elevated AFP of 9.3. Biopsy proven to be prostate cancer.  Prior Therapy:  He is started [place] on 06/27/2013. He subsequently received definitive radiation therapy for a planned dose of 75 gray completed in November 2014 under the care of Dr. Tammi Klippel.  His PSA subsequently noted close to 0.33. PSA was up to 11 on 10/29/2014 with a castrate levels of testosterone of 19.   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  Docetaxel 75 mg/m given every 3 weeks with Neulasta support. He is status post 10 cycles of treatment concluded on 11/21/2015. He had an excellent PSA response with PSA dropping from 222 to 0.17.   Current therapy: Zytiga 1000 mg with prednisone since September 2017.   Interim History:  Mr. Kanady presents today for a follow-up visit with an interpreter. Since his last visit, he continues to do very well without any recent complaints. He continues to take Zytiga without any major complications. He denied any nausea, vomiting or lower extremity edema. He denied any excessive fatigue or tiredness. His performance status and activity level not dramatically changed. He is spending a lot of time outside his house including exercising and being active. He does report periodic headaches associated with strenuous exercise outdoors.  He does not report any blurry vision or syncope. He does not report any fevers or chills.  He does not report any frequency urgency but does report dysuria and occasional hematuria. He does not report any skeletal  complaints of back pain shoulder pain or hip pain. He does not report any lymphadenopathy or petechiae. Remainder of his review of systems unremarkable.   Medications: I have reviewed the patient's current medications.  Current Outpatient Prescriptions  Medication Sig Dispense Refill  . predniSONE (DELTASONE) 5 MG tablet Take 1 tablet (5 mg total) by mouth 2 (two) times daily with a meal. 60 tablet 3  . ZYTIGA 250 MG tablet TAKE 4 TABLETS BY MOUTH DAILY. TAKE ON AN EMPTY STOMACH 1 HOUR BEFORE OR 2 HOURS AFTER A MEAL 120 tablet 0   No current facility-administered medications for this visit.      Allergies:  Allergies  Allergen Reactions  . Aspirin Hives    Past Medical History, Surgical history, Social history, and Family History were reviewed and updated.    Physical Exam: Blood pressure 134/81, pulse 79, temperature 98.4 F (36.9 C), temperature source Oral, resp. rate 18, height 5\' 3"  (1.6 m), weight 142 lb 4.8 oz (64.5 kg), SpO2 100 %. ECOG: 1 General appearance: Alert, awake gentleman without distress. Head: Normocephalic, without obvious abnormality. No oral ulcers or thrush. Neck: no adenopathy no thyroid masses. Lymph nodes: Cervical, supraclavicular, and axillary nodes normal. Heart:regular rate and rhythm, S1, S2 normal, no murmur, click, rub or gallop  Chest wall examination: Port-A-Cath site appeared without erythema or induration. Lung:chest clear, no wheezing, rales, normal symmetric air entry Abdomin: soft, non-tender, without masses or organomegaly. No rebound or guarding. EXT: no edema noted or masses noted. Skin: No ecchymosis or petechiae.  Lab Results: Lab Results  Component Value Date   WBC 5.3 05/04/2017  HGB 11.8 (L) 05/04/2017   HCT 35.7 (L) 05/04/2017   MCV 84.0 05/04/2017   PLT 232 05/04/2017     Chemistry      Component Value Date/Time   NA 144 05/04/2017 1149   K 3.4 (L) 05/04/2017 1149   CL 102 04/15/2015 2219   CO2 27 05/04/2017 1149    BUN 10.2 05/04/2017 1149   CREATININE 0.8 05/04/2017 1149      Component Value Date/Time   CALCIUM 9.3 05/04/2017 1149   ALKPHOS 106 05/04/2017 1149   AST 12 05/04/2017 1149   ALT 16 05/04/2017 1149   BILITOT 0.64 05/04/2017 1149       Results for KANAV, KAZMIERCZAK (MRN [id number]) as of 05/06/2017 12:05  Ref. Range 02/05/2017 11:11 03/17/2017 12:54 05/04/2017 11:49  PSA Latest Ref Range: 0.0 - 4.0 ng/mL 6.1 (H) 4.6 (H) 6.2 (H)      Impression and Plan:  66 year old gentleman with the following issues:  1. Prostate cancer diagnosed in June 2014. He presented with obstructive symptoms and found to have a Gleason score 4+4 = 8 prostate cancer with PSA of around 40. He was treated with androgen deprivation and radiation therapy with an excellent initial response and a PSA nadir of 0.33. PSA was up to 19 and Casodex was added.  He developed progression of disease with PSA up to 222 with liver metastasis.  He is status post Taxotere chemotherapy with an excellent response by PSA criteria as well as CT scan.   His PSA increased up to 8.9 Started Zytiga with prednisone in September 2017.   CT scan obtained on 02/09/2017 showed regression of his hepatic metastasis. He does have base of the lung nodules which have slightly increased in size.  His PSA continues to be under excellent control and the plan is to continue with the same dose and schedule of Zytiga. We will repeat imaging studies in September 2018.    2. Liver masses: biopsy-proven to be prostate cancer CT scan on 04/07/2016 showed continued response and decrease in the size. CT scan on 02/09/2017 showed continued regression of his liver masses.  3.  IV access: Port-A-Cath maintained without complications. This will be flushed with each visit.  4. Hypokalemia: Potassium remains within normal range. He will continue potassium supplements as needed.  5. Prognosis: He does have an incurable malignancy but continues to respond very  well to systemic treatment. His performance status continues to be excellent and aggressive therapy is warranted.  6. Follow-up: Will be in 8 weeks to assess his clinical status.    [facility], MD 5/17/201812:02 PM

## 2017-05-11 ENCOUNTER — Other Ambulatory Visit: Payer: Self-pay | Admitting: Oncology

## 2017-05-11 DIAGNOSIS — C61 Malignant neoplasm of prostate: Secondary | ICD-10-CM

## 2017-05-11 MED FILL — predniSONE 5 MG TABS: 5 | 30 days supply | Qty: 60 | Fill #0

## 2017-05-24 ENCOUNTER — Other Ambulatory Visit: Payer: Self-pay | Admitting: Oncology

## 2017-05-24 MED FILL — ZYTIGA 250 MG TABLET: 250 | 30 days supply | Qty: 120 | Fill #0

## 2017-05-28 HISTORY — PX: UPPER GI ENDOSCOPY: SHX6162

## 2017-06-15 MED FILL — predniSONE 5 MG TABS: 5 | 30 days supply | Qty: 60 | Fill #1

## 2017-06-25 ENCOUNTER — Other Ambulatory Visit: Payer: Self-pay | Admitting: Oncology

## 2017-07-06 ENCOUNTER — Other Ambulatory Visit (HOSPITAL_BASED_OUTPATIENT_CLINIC_OR_DEPARTMENT_OTHER): Payer: PPO

## 2017-07-06 ENCOUNTER — Ambulatory Visit (HOSPITAL_BASED_OUTPATIENT_CLINIC_OR_DEPARTMENT_OTHER): Payer: PPO

## 2017-07-06 DIAGNOSIS — C61 Malignant neoplasm of prostate: Secondary | ICD-10-CM

## 2017-07-06 DIAGNOSIS — Z95828 Presence of other vascular implants and grafts: Secondary | ICD-10-CM

## 2017-07-06 DIAGNOSIS — Z452 Encounter for adjustment and management of vascular access device: Secondary | ICD-10-CM

## 2017-07-06 LAB — COMPREHENSIVE METABOLIC PANEL
ALT: 14 U/L (ref 0–55)
ANION GAP: 12 meq/L — AB (ref 3–11)
AST: 12 U/L (ref 5–34)
Albumin: 3.3 g/dL — ABNORMAL LOW (ref 3.5–5.0)
Alkaline Phosphatase: 86 U/L (ref 40–150)
BILIRUBIN TOTAL: 0.28 mg/dL (ref 0.20–1.20)
BUN: 10.5 mg/dL (ref 7.0–26.0)
CALCIUM: 9.4 mg/dL (ref 8.4–10.4)
CHLORIDE: 106 meq/L (ref 98–109)
CO2: 26 mEq/L (ref 22–29)
CREATININE: 0.8 mg/dL (ref 0.7–1.3)
EGFR: >90 mL/min/{1.73_m2} (ref >90)
Glucose: 104 mg/dl (ref 70–140)
Potassium: 3.6 mEq/L (ref 3.5–5.1)
Sodium: 144 mEq/L (ref 136–145)
TOTAL PROTEIN: 7.2 g/dL (ref 6.4–8.3)

## 2017-07-06 LAB — CBC WITH DIFFERENTIAL/PLATELET
BASO%: 0.7 % (ref 0.0–2.0)
Basophils Absolute: 0 10*3/uL (ref 0.0–0.1)
EOS%: 4.1 % (ref 0.0–7.0)
Eosinophils Absolute: 0.2 10*3/uL (ref 0.0–0.5)
HEMATOCRIT: 34 % — AB (ref 38.4–49.9)
HGB: 11.5 g/dL — ABNORMAL LOW (ref 13.0–17.1)
LYMPH#: 0.8 10*3/uL — AB (ref 0.9–3.3)
LYMPH%: 17.1 % (ref 14.0–49.0)
MCH: 27.8 pg (ref 27.2–33.4)
MCHC: 33.7 g/dL (ref 32.0–36.0)
MCV: 82.4 fL (ref 79.3–98.0)
MONO#: 0.3 10*3/uL (ref 0.1–0.9)
MONO%: 7.2 % (ref 0.0–14.0)
NEUT%: 70.9 % (ref 39.0–75.0)
NEUTROS ABS: 3.3 10*3/uL (ref 1.5–6.5)
PLATELETS: 233 10*3/uL (ref 140–400)
RBC: 4.13 10*6/uL — ABNORMAL LOW (ref 4.20–5.82)
RDW: 13.2 % (ref 11.0–14.6)
WBC: 4.7 10*3/uL (ref 4.0–10.3)

## 2017-07-06 MED ORDER — HEPARIN SOD (PORK) LOCK FLUSH 100 UNIT/ML IV SOLN
500.0000 [IU] | Freq: Once | INTRAVENOUS | Status: AC
  Administered 2017-07-06: 500 [IU]
  Filled 2017-07-06: qty 5

## 2017-07-06 MED ORDER — SODIUM CHLORIDE 0.9% FLUSH
10.0000 mL | Freq: Once | INTRAVENOUS | Status: AC
  Administered 2017-07-06: 10 mL
  Filled 2017-07-06: qty 10

## 2017-07-06 MED FILL — ZYTIGA 250 MG TABLET: 250 | 30 days supply | Qty: 120 | Fill #0

## 2017-07-06 NOTE — Patient Instructions (Signed)

## 2017-07-07 LAB — PSA: Prostate Specific Ag, Serum: 7.1 ng/mL — ABNORMAL HIGH (ref 0.0–4.0)

## 2017-07-08 ENCOUNTER — Telehealth: Payer: Self-pay | Admitting: Oncology

## 2017-07-08 ENCOUNTER — Ambulatory Visit (HOSPITAL_BASED_OUTPATIENT_CLINIC_OR_DEPARTMENT_OTHER): Payer: PPO | Admitting: Oncology

## 2017-07-08 VITALS — BP 135/68 | HR 82 | Temp 98.4°F | Resp 18 | Ht 63.0 in | Wt 141.9 lb

## 2017-07-08 DIAGNOSIS — C787 Secondary malignant neoplasm of liver and intrahepatic bile duct: Secondary | ICD-10-CM | POA: Diagnosis not present

## 2017-07-08 DIAGNOSIS — C61 Malignant neoplasm of prostate: Secondary | ICD-10-CM

## 2017-07-08 DIAGNOSIS — E876 Hypokalemia: Secondary | ICD-10-CM | POA: Diagnosis not present

## 2017-07-08 MED ORDER — ABIRATERONE ACETATE 250 MG PO TABS: 1000.0000 mg | ORAL_TABLET | Freq: Every day | ORAL | 3 refills | Status: DC

## 2017-07-08 NOTE — Telephone Encounter (Signed)
Scheduled appt per 7/19 los- Gave patient AVS and calender per los.  

## 2017-07-08 NOTE — Progress Notes (Signed)
Hematology and Oncology Follow Up Visit  Adar Rase [id number] [birth year] 66 y.o. 07/08/2017 10:49 AM [name], MDDewey, [name], MD   Principle Diagnosis: 66 year old gentleman with prostate cancer diagnosed in 2014. He presented with obstructive symptoms and found to have a Gleason score 4+4 = 8 prostate cancer with PSA of around 40.  Liver mass diagnosed in December 2015 with an elevated AFP of 9.3. Biopsy proven to be prostate cancer.  Prior Therapy:  He is started [place] on 06/27/2013. He subsequently received definitive radiation therapy for a planned dose of 75 gray completed in November 2014 under the care of Dr. Tammi Klippel.  His PSA subsequently noted close to 0.33. PSA was up to 11 on 10/29/2014 with a castrate levels of testosterone of 19.   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  Docetaxel 75 mg/m given every 3 weeks with Neulasta support. He is status post 10 cycles of treatment concluded on 11/21/2015. He had an excellent PSA response with PSA dropping from 222 to 0.17.   Current therapy: Zytiga 1000 mg with prednisone since September 2017.   Interim History:  Mr. Knoble presents today for a follow-up visit with an interpreter. Since his last visit, he reports no changes in his health. He did miss 2 weeks of Zytiga because he ran out of his medication. He did resume it in the last few days without any major complications. He denied any nausea, vomiting or lower extremity edema. He denied any excessive fatigue or tiredness. His performance status and activity level not dramatically changed. He denied any recent hospitalization or illnesses. He denied any pathological fractures.  He does not report any blurry vision or syncope. He does not report any fevers or chills.  He does not report any frequency urgency but does report dysuria and occasional hematuria. He does not report any skeletal complaints of back  pain shoulder pain or hip pain. He does not report any lymphadenopathy or petechiae. Remainder of his review of systems unremarkable.   Medications: I have reviewed the patient's current medications.  Current Outpatient Prescriptions  Medication Sig Dispense Refill  . abiraterone Acetate (ZYTIGA) 250 MG tablet Take 4 tablets (1,000 mg total) by mouth daily. Take on an empty stomach 1 hour before or 2 hours after a meal 120 tablet 3  . predniSONE (DELTASONE) 5 MG tablet TAKE 1 TABLET BY MOUTH TWICE DAILY WITH A MEAL 60 tablet 3   No current facility-administered medications for this visit.      Allergies:  Allergies  Allergen Reactions  . Aspirin Hives    Past Medical History, Surgical history, Social history, and Family History were reviewed and updated.    Physical Exam: Blood pressure 135/68, pulse 82, temperature 98.4 F (36.9 C), temperature source Oral, resp. rate 18, height 5\' 3"  (1.6 m), weight 141 lb 14.4 oz (64.4 kg), SpO2 100 %. ECOG: 1 General appearance: Well-appearing gentleman appeared without distress. Head: Normocephalic, without obvious abnormality. No oral ulcers or lesions. Neck: no adenopathy no thyroid masses. Lymph nodes: Cervical, supraclavicular, and axillary nodes normal. Heart:regular rate and rhythm, S1, S2 normal, no murmur, click, rub or gallop  Chest wall examination: Port-A-Cath site appeared without erythema or induration. Lung:chest clear, no wheezing, rales, normal symmetric air entry Abdomin: soft, non-tender, without masses or organomegaly. No shifting dullness or ascites. EXT: no edema noted or masses noted. Skin: No ecchymosis or petechiae.  Lab Results: Lab Results  Component Value Date  WBC 4.7 07/06/2017   HGB 11.5 (L) 07/06/2017   HCT 34.0 (L) 07/06/2017   MCV 82.4 07/06/2017   PLT 233 07/06/2017     Chemistry      Component Value Date/Time   NA 144 07/06/2017 1323   K 3.6 07/06/2017 1323   CL 102 04/15/2015 2219   CO2 26  07/06/2017 1323   BUN 10.5 07/06/2017 1323   CREATININE 0.8 07/06/2017 1323      Component Value Date/Time   CALCIUM 9.4 07/06/2017 1323   ALKPHOS 86 07/06/2017 1323   AST 12 07/06/2017 1323   ALT 14 07/06/2017 1323   BILITOT 0.28 07/06/2017 1323         Results for STIRLING, ORTON (MRN [id number]) as of 07/08/2017 10:38  Ref. Range 05/04/2017 11:49 07/06/2017 13:23  Prostate Specific Ag, Serum Latest Ref Range: 0.0 - 4.0 ng/mL 6.2 (H) 7.1 (H)     Impression and Plan:  66 year old gentleman with the following issues:  1. Prostate cancer diagnosed in June 2014. He presented with obstructive symptoms and found to have a Gleason score 4+4 = 8 prostate cancer with PSA of around 40. He was treated with androgen deprivation and radiation therapy with an excellent initial response and a PSA nadir of 0.33. PSA was up to 19 and Casodex was added.  He developed progression of disease with PSA up to 222 with liver metastasis.  He is status post Taxotere chemotherapy with an excellent response by PSA criteria as well as CT scan.   His PSA increased up to 8.9 Started Zytiga with prednisone in September 2017.   CT scan obtained on 02/09/2017 showed regression of his hepatic metastasis. He does have base of the lung nodules which have slightly increased in size.  The plan is to continue with the same dose and schedule given his excellent clinical benefit and reasonable PSA control. His PSA did go up slightly but he had missed 2 weeks of medication. Different salvage therapy will be used in the future if he develops progression of disease.    2. Liver masses: biopsy-proven to be prostate cancer CT scan on 04/07/2016 showed continued response and decrease in the size. CT scan on 02/09/2017 showed continued regression of his liver masses. We will repeat imaging studies in November 2018.  3.  IV access: Port-A-Cath maintained without complications. This will be flushed with each visit.  4.  Hypokalemia: Potassium remains within normal range. He will continue potassium supplements as needed.  5. Prognosis: He does have an incurable malignancy but continues to respond very well to systemic treatment. His performance status continues to be excellent and aggressive therapy will be continued.  6. Follow-up: Will be in 6 to 8 weeks to assess his clinical status.    [facility], MD 7/19/201810:49 AM

## 2017-07-26 MED FILL — predniSONE 5 MG TABS: 5 | 30 days supply | Qty: 60 | Fill #2

## 2017-08-02 MED FILL — ZYTIGA 250 MG TABLET: 250 | 30 days supply | Qty: 120 | Fill #0

## 2017-08-24 ENCOUNTER — Ambulatory Visit (HOSPITAL_BASED_OUTPATIENT_CLINIC_OR_DEPARTMENT_OTHER): Payer: PPO

## 2017-08-24 ENCOUNTER — Other Ambulatory Visit (HOSPITAL_BASED_OUTPATIENT_CLINIC_OR_DEPARTMENT_OTHER): Payer: PPO

## 2017-08-24 DIAGNOSIS — C61 Malignant neoplasm of prostate: Secondary | ICD-10-CM

## 2017-08-24 DIAGNOSIS — Z95828 Presence of other vascular implants and grafts: Secondary | ICD-10-CM

## 2017-08-24 LAB — CBC WITH DIFFERENTIAL/PLATELET
BASO%: 0.7 % (ref 0.0–2.0)
BASOS ABS: 0 10*3/uL (ref 0.0–0.1)
EOS%: 2.2 % (ref 0.0–7.0)
Eosinophils Absolute: 0.1 10*3/uL (ref 0.0–0.5)
HEMATOCRIT: 36.1 % — AB (ref 38.4–49.9)
HGB: 12.2 g/dL — ABNORMAL LOW (ref 13.0–17.1)
LYMPH#: 1.2 10*3/uL (ref 0.9–3.3)
LYMPH%: 24.5 % (ref 14.0–49.0)
MCH: 27.7 pg (ref 27.2–33.4)
MCHC: 33.6 g/dL (ref 32.0–36.0)
MCV: 82.4 fL (ref 79.3–98.0)
MONO#: 0.3 10*3/uL (ref 0.1–0.9)
MONO%: 6.5 % (ref 0.0–14.0)
NEUT%: 66.1 % (ref 39.0–75.0)
NEUTROS ABS: 3.2 10*3/uL (ref 1.5–6.5)
PLATELETS: 209 10*3/uL (ref 140–400)
RBC: 4.38 10*6/uL (ref 4.20–5.82)
RDW: 14.1 % (ref 11.0–14.6)
WBC: 4.9 10*3/uL (ref 4.0–10.3)

## 2017-08-24 LAB — COMPREHENSIVE METABOLIC PANEL
ALT: 12 U/L (ref 0–55)
ANION GAP: 8 meq/L (ref 3–11)
AST: 12 U/L (ref 5–34)
Albumin: 3.6 g/dL (ref 3.5–5.0)
Alkaline Phosphatase: 88 U/L (ref 40–150)
BILIRUBIN TOTAL: 0.35 mg/dL (ref 0.20–1.20)
BUN: 16.3 mg/dL (ref 7.0–26.0)
CHLORIDE: 105 meq/L (ref 98–109)
CO2: 27 mEq/L (ref 22–29)
CREATININE: 0.8 mg/dL (ref 0.7–1.3)
Calcium: 9.1 mg/dL (ref 8.4–10.4)
EGFR: >90 mL/min/{1.73_m2} (ref >90)
Glucose: 88 mg/dl (ref 70–140)
Potassium: 3.4 mEq/L — ABNORMAL LOW (ref 3.5–5.1)
Sodium: 140 mEq/L (ref 136–145)
TOTAL PROTEIN: 7.3 g/dL (ref 6.4–8.3)

## 2017-08-24 MED ORDER — SODIUM CHLORIDE 0.9% FLUSH
10.0000 mL | Freq: Once | INTRAVENOUS | Status: AC
Start: 2017-08-24 — End: 2017-08-24
  Administered 2017-08-24: 10 mL
  Filled 2017-08-24: qty 10

## 2017-08-24 MED ORDER — HEPARIN SOD (PORK) LOCK FLUSH 100 UNIT/ML IV SOLN
500.0000 [IU] | Freq: Once | INTRAVENOUS | Status: AC
  Administered 2017-08-24: 500 [IU]
  Filled 2017-08-24: qty 5

## 2017-08-25 ENCOUNTER — Telehealth: Payer: Self-pay | Admitting: Oncology

## 2017-08-25 ENCOUNTER — Ambulatory Visit (HOSPITAL_BASED_OUTPATIENT_CLINIC_OR_DEPARTMENT_OTHER): Payer: PPO | Admitting: Oncology

## 2017-08-25 VITALS — BP 150/78 | HR 72 | Temp 97.7°F | Resp 18 | Ht 63.0 in | Wt 142.2 lb

## 2017-08-25 DIAGNOSIS — C61 Malignant neoplasm of prostate: Secondary | ICD-10-CM

## 2017-08-25 DIAGNOSIS — E876 Hypokalemia: Secondary | ICD-10-CM | POA: Diagnosis not present

## 2017-08-25 DIAGNOSIS — C787 Secondary malignant neoplasm of liver and intrahepatic bile duct: Secondary | ICD-10-CM

## 2017-08-25 LAB — PSA: PROSTATE SPECIFIC AG, SERUM: 6.8 ng/mL — AB (ref 0.0–4.0)

## 2017-08-25 NOTE — Telephone Encounter (Signed)
Gave avs and calendar for November appointment

## 2017-08-25 NOTE — Progress Notes (Signed)
Hematology and Oncology Follow Up Visit  [name] Johnston [id number] [birth year] 66 y.o. 08/25/2017 10:45 AM [name] Johnston, MDDewey, [name], MD   Principle Diagnosis: 66 year old gentleman with prostate cancer diagnosed in 2014. He presented with obstructive symptoms and found to have a Gleason score 4+4 = 8 prostate cancer with PSA of around 40.  Liver mass diagnosed in December 2015 with an elevated AFP of 9.3. Biopsy proven to be prostate cancer.  Prior Therapy:  He is started [place] on 06/27/2013. He subsequently received definitive radiation therapy for a planned dose of 75 gray completed in November 2014 under the care of Dr. Tammi Johnston.  His PSA subsequently noted close to 0.33. PSA was up to 11 on 10/29/2014 with a castrate levels of testosterone of 19.   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  Docetaxel 75 mg/m given every 3 weeks with Neulasta support. He is status post 10 cycles of treatment concluded on 11/21/2015. He had an excellent PSA response with PSA dropping from 222 to 0.17.   Current therapy: Zytiga 1000 mg with prednisone since September 2017.   Interim History:  [name] presents today for a follow-up visit with an interpreter. Since his last visit, he continues to do well without any major changes. He continues to take Zytiga without any new complications. He denied any nausea, vomiting or lower extremity edema. He denied any excessive fatigue or tiredness. His performance status and activity level not dramatically changed. He does report occasional gas in his abdomen without any as satiety, constipation, diarrhea or hematochezia. He continues to be eat well and maintaining his weight.  He does not report any blurry vision or syncope. He does not report any fevers or chills.  He does not report any frequency urgency but does report dysuria and occasional hematuria. He does not report any skeletal complaints  of back pain shoulder pain or hip pain. He does not report any lymphadenopathy or petechiae. Remainder of his review of systems unremarkable.   Medications: I have reviewed the patient's current medications.  Current Outpatient Prescriptions  Medication Sig Dispense Refill  . abiraterone Acetate (ZYTIGA) 250 MG tablet Take 4 tablets (1,000 mg total) by mouth daily. Take on an empty stomach 1 hour before or 2 hours after a meal 120 tablet 3  . predniSONE (DELTASONE) 5 MG tablet TAKE 1 TABLET BY MOUTH TWICE DAILY WITH A MEAL 60 tablet 3   No current facility-administered medications for this visit.      Allergies:  Allergies  Allergen Reactions  . Aspirin Hives    Past Medical History, Surgical history, Social history, and Family History were reviewed and updated.    Physical Exam: Blood pressure (!) 150/78, pulse 72, temperature 97.7 F (36.5 C), temperature source Oral, resp. rate 18, height 5\' 3"  (1.6 m), weight 142 lb 3.2 oz (64.5 kg), SpO2 100 %. ECOG: 1 General appearance: Alert, awake gentleman without distress. Head: Normocephalic, without obvious abnormality. No oral thrush or ulcers. Neck: no adenopathy no thyroid masses. Lymph nodes: Cervical, supraclavicular, and axillary nodes normal. Heart:regular rate and rhythm, S1, S2 normal, no murmur, click, rub or gallop  Chest wall examination: Port-A-Cath site appeared without erythema or induration. Lung:chest clear, no wheezing, rales, normal symmetric air entry Abdomin: soft, non-tender, without masses or organomegaly. No rebound or guarding. EXT: no edema noted or masses noted. Skin: No ecchymosis or petechiae.  Lab Results: Lab Results  Component Value Date   WBC 4.9  08/24/2017   HGB 12.2 (L) 08/24/2017   HCT 36.1 (L) 08/24/2017   MCV 82.4 08/24/2017   PLT 209 08/24/2017     Chemistry      Component Value Date/Time   NA 140 08/24/2017 0952   K 3.4 (L) 08/24/2017 0952   CL 102 04/15/2015 2219   CO2 27  08/24/2017 0952   BUN 16.3 08/24/2017 0952   CREATININE 0.8 08/24/2017 0952      Component Value Date/Time   CALCIUM 9.1 08/24/2017 0952   ALKPHOS 88 08/24/2017 0952   AST 12 08/24/2017 0952   ALT 12 08/24/2017 0952   BILITOT 0.35 08/24/2017 0952       Results for [name] Johnston (MRN [id number]) as of 08/25/2017 10:47  Ref. Range 05/04/2017 11:49 07/06/2017 13:23 08/24/2017 09:52  Prostate Specific Ag, Serum Latest Ref Range: 0.0 - 4.0 ng/mL 6.2 (H) 7.1 (H) 6.8 (H)        Impression and Plan:  66 year old gentleman with the following issues:  1. Prostate cancer diagnosed in June 2014. He presented with obstructive symptoms and found to have a Gleason score 4+4 = 8 prostate cancer with PSA of around 40. He was treated with androgen deprivation and radiation therapy with an excellent initial response and a PSA nadir of 0.33. PSA was up to 19 and Casodex was added.  He developed progression of disease with PSA up to 222 with liver metastasis.  He is status post Taxotere chemotherapy with an excellent response by PSA criteria as well as CT scan.   His PSA increased up to 8.9 Started Zytiga with prednisone in September 2017.   CT scan obtained on 02/09/2017 showed regression of his hepatic metastasis. He does have base of the lung nodules which have slightly increased in size.  His PSA continues to be under control without any clinical signs of progression. The plan is to continue the same dose and schedule and repeat imaging studies in January 2019.    2. Liver masses: biopsy-proven to be prostate cancer CT scan on 04/07/2016 showed continued response and decrease in the size. CT scan on 02/09/2017 showed continued regression of his liver masses. We will repeat imaging studies in January 2019.  3.  IV access: Port-A-Cath maintained without complications. This will be flushed with each visit.  4. Hypokalemia: Potassium relatively stable on potassium supplements.  5. Prognosis: He does  have an incurable malignancy but continues to respond very well to systemic treatment. His performance status continues to be excellent and aggressive therapy will be continued.  6. Follow-up: Will be in 8 weeks to assess his clinical status.    Zola Button, MD 9/5/201810:45 AM

## 2017-08-27 MED FILL — predniSONE 5 MG TABS: 5 | 30 days supply | Qty: 60 | Fill #3

## 2017-08-30 MED FILL — ZYTIGA 250 MG TABLET: 250 | 30 days supply | Qty: 120 | Fill #1

## 2017-09-02 ENCOUNTER — Other Ambulatory Visit: Payer: Self-pay | Admitting: *Deleted

## 2017-09-02 ENCOUNTER — Encounter: Payer: Self-pay | Admitting: Oncology

## 2017-09-02 MED ORDER — ABIRATERONE ACETATE 250 MG PO TABS: 1000.0000 mg | ORAL_TABLET | Freq: Every day | ORAL | 3 refills | Status: DC

## 2017-09-20 DIAGNOSIS — C7919 Secondary malignant neoplasm of other urinary organs: Secondary | ICD-10-CM | POA: Diagnosis not present

## 2017-09-20 DIAGNOSIS — Z5111 Encounter for antineoplastic chemotherapy: Secondary | ICD-10-CM | POA: Diagnosis not present

## 2017-09-20 DIAGNOSIS — R351 Nocturia: Secondary | ICD-10-CM | POA: Diagnosis not present

## 2017-09-20 DIAGNOSIS — C61 Malignant neoplasm of prostate: Secondary | ICD-10-CM | POA: Diagnosis not present

## 2017-09-22 ENCOUNTER — Telehealth: Payer: Self-pay | Admitting: Pharmacy Technician

## 2017-09-22 ENCOUNTER — Encounter: Payer: Self-pay | Admitting: *Deleted

## 2017-09-22 NOTE — Telephone Encounter (Signed)
Oral Oncology Patient Advocate Encounter  Was successful in securing patient a $58 grant from [facility] [employer]) to continue to provide copayment coverage for his Zytiga.  This will keep the out of pocket expense at $0.      The billing information is as follows and has been shared with [place].   Member ID: [id number] Group ID: [id number] RxBin: [id number] Dates of Eligibility: 09/10/2017 through 09/09/2018  [name]. [name], [name] [facility] [place] [phone] 09/22/2017 9:14 AM

## 2017-09-24 MED FILL — ZYTIGA 250 MG TABLET: 250 | 30 days supply | Qty: 120 | Fill #0

## 2017-09-29 ENCOUNTER — Other Ambulatory Visit: Payer: Self-pay | Admitting: Oncology

## 2017-09-29 DIAGNOSIS — C61 Malignant neoplasm of prostate: Secondary | ICD-10-CM

## 2017-09-29 MED FILL — predniSONE 5 MG TABS: 5 | 30 days supply | Qty: 60 | Fill #0

## 2017-10-26 ENCOUNTER — Other Ambulatory Visit (HOSPITAL_BASED_OUTPATIENT_CLINIC_OR_DEPARTMENT_OTHER): Payer: PPO

## 2017-10-26 ENCOUNTER — Ambulatory Visit (HOSPITAL_BASED_OUTPATIENT_CLINIC_OR_DEPARTMENT_OTHER): Payer: PPO

## 2017-10-26 VITALS — BP 110/72 | HR 78 | Resp 20

## 2017-10-26 DIAGNOSIS — Z95828 Presence of other vascular implants and grafts: Secondary | ICD-10-CM

## 2017-10-26 DIAGNOSIS — C61 Malignant neoplasm of prostate: Secondary | ICD-10-CM

## 2017-10-26 LAB — CBC WITH DIFFERENTIAL/PLATELET
BASO%: 0.3 % (ref 0.0–2.0)
Basophils Absolute: 0 10*3/uL (ref 0.0–0.1)
EOS%: 1.5 % (ref 0.0–7.0)
Eosinophils Absolute: 0.1 10*3/uL (ref 0.0–0.5)
HCT: 37.8 % — ABNORMAL LOW (ref 38.4–49.9)
HGB: 12.4 g/dL — ABNORMAL LOW (ref 13.0–17.1)
LYMPH%: 16.4 % (ref 14.0–49.0)
MCH: 27.3 pg (ref 27.2–33.4)
MCHC: 32.8 g/dL (ref 32.0–36.0)
MCV: 83.1 fL (ref 79.3–98.0)
MONO#: 0.5 10*3/uL (ref 0.1–0.9)
MONO%: 6.9 % (ref 0.0–14.0)
NEUT#: 4.9 10*3/uL (ref 1.5–6.5)
NEUT%: 74.9 % (ref 39.0–75.0)
PLATELETS: 220 10*3/uL (ref 140–400)
RBC: 4.55 10*6/uL (ref 4.20–5.82)
RDW: 13.3 % (ref 11.0–14.6)
WBC: 6.5 10*3/uL (ref 4.0–10.3)
lymph#: 1.1 10*3/uL (ref 0.9–3.3)

## 2017-10-26 LAB — COMPREHENSIVE METABOLIC PANEL
ALT: 10 U/L (ref 0–55)
AST: 13 U/L (ref 5–34)
Albumin: 3.6 g/dL (ref 3.5–5.0)
Alkaline Phosphatase: 99 U/L (ref 40–150)
Anion Gap: 8 mEq/L (ref 3–11)
BUN: 10.2 mg/dL (ref 7.0–26.0)
CO2: 28 mEq/L (ref 22–29)
Calcium: 9.4 mg/dL (ref 8.4–10.4)
Chloride: 104 mEq/L (ref 98–109)
Creatinine: 0.8 mg/dL (ref 0.7–1.3)
EGFR: >60 mL/min/{1.73_m2} (ref >60)
Glucose: 82 mg/dl (ref 70–140)
Potassium: 3.5 mEq/L (ref 3.5–5.1)
Sodium: 141 mEq/L (ref 136–145)
Total Bilirubin: 0.45 mg/dL (ref 0.20–1.20)
Total Protein: 7.6 g/dL (ref 6.4–8.3)

## 2017-10-26 MED ORDER — HEPARIN SOD (PORK) LOCK FLUSH 100 UNIT/ML IV SOLN
500.0000 [IU] | Freq: Once | INTRAVENOUS | Status: AC
  Administered 2017-10-26: 500 [IU]
  Filled 2017-10-26: qty 5

## 2017-10-26 MED ORDER — SODIUM CHLORIDE 0.9% FLUSH
10.0000 mL | Freq: Once | INTRAVENOUS | Status: AC
  Administered 2017-10-26: 10 mL
  Filled 2017-10-26: qty 10

## 2017-10-26 NOTE — Patient Instructions (Signed)

## 2017-10-27 LAB — TESTOSTERONE: Testosterone, Serum: <3 ng/dL — ABNORMAL LOW (ref 264–916)

## 2017-10-27 LAB — PSA: PROSTATE SPECIFIC AG, SERUM: 10.7 ng/mL — AB (ref 0.0–4.0)

## 2017-10-28 ENCOUNTER — Ambulatory Visit (HOSPITAL_BASED_OUTPATIENT_CLINIC_OR_DEPARTMENT_OTHER): Payer: PPO | Admitting: Oncology

## 2017-10-28 ENCOUNTER — Telehealth: Payer: Self-pay | Admitting: Oncology

## 2017-10-28 VITALS — BP 139/81 | HR 84 | Temp 98.7°F | Resp 17 | Ht 63.0 in | Wt 140.6 lb

## 2017-10-28 DIAGNOSIS — C787 Secondary malignant neoplasm of liver and intrahepatic bile duct: Secondary | ICD-10-CM

## 2017-10-28 DIAGNOSIS — C61 Malignant neoplasm of prostate: Secondary | ICD-10-CM

## 2017-10-28 DIAGNOSIS — E876 Hypokalemia: Secondary | ICD-10-CM | POA: Diagnosis not present

## 2017-10-28 MED ORDER — ALBUTEROL SULFATE HFA 108 (90 BASE) MCG/ACT IN AERS: 2.0000 | INHALATION_SPRAY | Freq: Four times a day (QID) | RESPIRATORY_TRACT | 3 refills | Status: DC | PRN

## 2017-10-28 NOTE — Telephone Encounter (Signed)
Scheduled appt per 11/8 los - Gave patient AVS and calender per los.  

## 2017-10-28 NOTE — Progress Notes (Signed)
Hematology and Oncology Follow Up Visit  [name] Johnston [id number] December 12, 1951 66 y.o. 10/28/2017 12:12 PM [name] Johnston, MDDewey, [name], MD   Principle Diagnosis: 66 year old gentleman with prostate cancer diagnosed in 2014. He presented with obstructive symptoms and found to have a Gleason score 4+4 = 8 prostate cancer with PSA of around 40.  Liver mass diagnosed in December 2015 with an elevated AFP of 9.3. Biopsy proven to be prostate cancer.  Prior Therapy:  He is started [place] on 06/27/2013. He subsequently received definitive radiation therapy for a planned dose of 75 gray completed in November 2014 under the care of Dr. Tammi Klippel.  His PSA subsequently noted close to 0.33. PSA was up to 11 on 10/29/2014 with a castrate levels of testosterone of 19.   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  Docetaxel 75 mg/m given every 3 weeks with Neulasta support. He is status post 10 cycles of treatment concluded on 11/21/2015. He had an excellent PSA response with PSA dropping from 222 to 0.17.   Current therapy: Zytiga 1000 mg with prednisone since September 2017.   Interim History:  Mr. Burgoon presents today for a follow-up visit. Since his last visit, he reports periodic symptoms of wheezing especially at nighttime.  He has been diagnosed with asthma in the past and have used inhalers to help with this process.  He is currently using his grandsons inhaler to help him when he needs it.  He denies any hemoptysis or dyspnea on exertion.  He feels well most of the time but occasionally at nighttime he requires that inhaler.    He continues to take Zytiga without any new complications. He denied any nausea, vomiting or lower extremity edema. He denied any excessive fatigue or tiredness. His performance status and activity level not dramatically changed.  He eats well without any major changes in his performance status or activity  level.  He does not report any blurry vision or syncope. He does not report any fevers or chills.  He does not report any frequency urgency but does report dysuria and occasional hematuria. He does not report any skeletal complaints of back pain shoulder pain or hip pain. He does not report any lymphadenopathy or petechiae. Remainder of his review of systems unremarkable.   Medications: I have reviewed the patient's current medications.  Current Outpatient Medications  Medication Sig Dispense Refill  . abiraterone Acetate (ZYTIGA) 250 MG tablet Take 4 tablets (1,000 mg total) by mouth daily. Take on an empty stomach 1 hour before or 2 hours after a meal 120 tablet 3  . predniSONE (DELTASONE) 5 MG tablet TAKE 1 TABLET BY MOUTH TWICE DAILY WITH A MEAL 60 tablet 3  . albuterol (VENTOLIN HFA) 108 (90 Base) MCG/ACT inhaler Inhale 2 puffs every 6 (six) hours as needed into the lungs for wheezing or shortness of breath. 1 Inhaler 3   No current facility-administered medications for this visit.      Allergies:  Allergies  Allergen Reactions  . Aspirin Hives    Past Medical History, Surgical history, Social history, and Family History were reviewed and updated.    Physical Exam: Blood pressure 139/81, pulse 84, temperature 98.7 F (37.1 C), temperature source Oral, resp. rate 17, height 5\' 3"  (1.6 m), weight 140 lb 9.6 oz (63.8 kg), SpO2 99 %. ECOG: 1 General appearance: Well-appearing gentleman appeared without distress. Head: Normocephalic, without obvious abnormality. No oral thrush or ulcers. Neck: no adenopathy no neck  masses. Lymph nodes: Cervical, supraclavicular, and axillary nodes normal. Heart:regular rate and rhythm, S1, S2 normal, no murmur, click, rub or gallop  Chest wall examination: Port-A-Cath site appeared without erythema or induration. Lung:chest clear, normal symmetric air entry.  Expiratory wheezes noted. Abdomin: soft, non-tender, without masses or organomegaly. No  shifting dullness or ascites. EXT: no edema noted or masses noted. Skin: No ecchymosis or petechiae.  Lab Results: Lab Results  Component Value Date   WBC 6.5 10/26/2017   HGB 12.4 (L) 10/26/2017   HCT 37.8 (L) 10/26/2017   MCV 83.1 10/26/2017   PLT 220 10/26/2017     Chemistry      Component Value Date/Time   NA 141 10/26/2017 0812   K 3.5 10/26/2017 0812   CL 102 04/15/2015 2219   CO2 28 10/26/2017 0812   BUN 10.2 10/26/2017 0812   CREATININE 0.8 10/26/2017 0812      Component Value Date/Time   CALCIUM 9.4 10/26/2017 0812   ALKPHOS 99 10/26/2017 0812   AST 13 10/26/2017 0812   ALT 10 10/26/2017 0812   BILITOT 0.45 10/26/2017 0812       Results for [name], Johnston (MRN [id number]) as of 10/28/2017 11:52  Ref. Range 08/24/2017 09:52 10/26/2017 08:12  Prostate Specific Ag, Serum Latest Ref Range: 0.0 - 4.0 ng/mL 6.8 (H) 10.7 (H)       Impression and Plan:  65 year old gentleman with the following issues:  1. Prostate cancer diagnosed in June 2014. He presented with obstructive symptoms and found to have a Gleason score 4+4 = 8 prostate cancer with PSA of around 40. He was treated with androgen deprivation and radiation therapy with an excellent initial response and a PSA nadir of 0.33. PSA was up to 19 and Casodex was added.  He developed progression of disease with PSA up to 222 with liver metastasis.  He is status post Taxotere chemotherapy with an excellent response by PSA criteria as well as CT scan.   His PSA increased up to 8.9 Started Zytiga with prednisone in September 2017.   CT scan obtained on 02/09/2017 showed regression of his hepatic metastasis. He does have base of the lung nodules which have slightly increased in size.  His PSA showed slight rise at this time but continues to be asymptomatic.  The plan is to continue with Zytiga at this time and repeat CT scan in 2 months.    2. Liver masses: biopsy-proven to be prostate cancer CT scan on 04/07/2016  showed continued response and decrease in the size. CT scan on 02/09/2017 showed continued regression of his liver masses. We will repeat imaging studies in January 2019.  3.  IV access: Port-A-Cath maintained without complications. This will be flushed with the next visit in 8 weeks.  4. Hypokalemia: Potassium relatively stable on potassium supplements.  5. Prognosis: He does have an incurable malignancy but continues to respond very well to systemic treatment. His performance status continues to be excellent and aggressive therapy will be continued.  6. Follow-up: Will be in 6-8 weeks after CT scan.    Zola Button, MD 11/8/201812:12 PM

## 2017-11-01 MED FILL — predniSONE 5 MG TABS: 5 | 30 days supply | Qty: 60 | Fill #1

## 2017-11-02 MED FILL — ZYTIGA 250 MG TABLET: 250 | 30 days supply | Qty: 120 | Fill #1

## 2017-11-29 MED FILL — ZYTIGA 250 MG TABLET: 250 | 30 days supply | Qty: 120 | Fill #2

## 2017-11-29 MED FILL — predniSONE 5 MG TABS: 5 | 30 days supply | Qty: 60 | Fill #2

## 2017-12-17 ENCOUNTER — Ambulatory Visit: Payer: PPO

## 2017-12-17 ENCOUNTER — Other Ambulatory Visit (HOSPITAL_BASED_OUTPATIENT_CLINIC_OR_DEPARTMENT_OTHER): Payer: PPO

## 2017-12-17 ENCOUNTER — Ambulatory Visit (HOSPITAL_COMMUNITY)
Admission: RE | Admit: 2017-12-17 | Discharge: 2017-12-17 | Disposition: A | Discharge status: Home / Self Care | Payer: PPO | Source: Ambulatory Visit | Attending: Oncology | Admitting: Oncology

## 2017-12-17 VITALS — BP 110/75 | HR 75 | Resp 18

## 2017-12-17 DIAGNOSIS — Z95828 Presence of other vascular implants and grafts: Secondary | ICD-10-CM

## 2017-12-17 DIAGNOSIS — C61 Malignant neoplasm of prostate: Secondary | ICD-10-CM | POA: Diagnosis not present

## 2017-12-17 DIAGNOSIS — N1330 Unspecified hydronephrosis: Secondary | ICD-10-CM | POA: Diagnosis not present

## 2017-12-17 DIAGNOSIS — N433 Hydrocele, unspecified: Secondary | ICD-10-CM | POA: Insufficient documentation

## 2017-12-17 DIAGNOSIS — K669 Disorder of peritoneum, unspecified: Secondary | ICD-10-CM | POA: Diagnosis not present

## 2017-12-17 DIAGNOSIS — K769 Liver disease, unspecified: Secondary | ICD-10-CM | POA: Diagnosis not present

## 2017-12-17 DIAGNOSIS — N509 Disorder of male genital organs, unspecified: Secondary | ICD-10-CM | POA: Diagnosis not present

## 2017-12-17 DIAGNOSIS — R911 Solitary pulmonary nodule: Secondary | ICD-10-CM | POA: Insufficient documentation

## 2017-12-17 LAB — COMPREHENSIVE METABOLIC PANEL
ALT: 15 U/L (ref 0–55)
ANION GAP: 10 meq/L (ref 3–11)
AST: 12 U/L (ref 5–34)
Albumin: 3.5 g/dL (ref 3.5–5.0)
Alkaline Phosphatase: 85 U/L (ref 40–150)
BILIRUBIN TOTAL: 0.41 mg/dL (ref 0.20–1.20)
BUN: 14 mg/dL (ref 7.0–26.0)
CHLORIDE: 105 meq/L (ref 98–109)
CO2: 28 meq/L (ref 22–29)
CREATININE: 1.1 mg/dL (ref 0.7–1.3)
Calcium: 9 mg/dL (ref 8.4–10.4)
EGFR: >60 mL/min/{1.73_m2} (ref >60)
GLUCOSE: 98 mg/dL (ref 70–140)
Potassium: 3.2 mEq/L — ABNORMAL LOW (ref 3.5–5.1)
Sodium: 142 mEq/L (ref 136–145)
TOTAL PROTEIN: 7.1 g/dL (ref 6.4–8.3)

## 2017-12-17 LAB — CBC WITH DIFFERENTIAL/PLATELET
BASO%: 0.6 % (ref 0.0–2.0)
BASOS ABS: 0 10*3/uL (ref 0.0–0.1)
EOS%: 1.8 % (ref 0.0–7.0)
Eosinophils Absolute: 0.1 10*3/uL (ref 0.0–0.5)
HCT: 36.2 % — ABNORMAL LOW (ref 38.4–49.9)
HEMOGLOBIN: 12 g/dL — AB (ref 13.0–17.1)
LYMPH%: 17 % (ref 14.0–49.0)
MCH: 27 pg — AB (ref 27.2–33.4)
MCHC: 33 g/dL (ref 32.0–36.0)
MCV: 81.6 fL (ref 79.3–98.0)
MONO#: 0.5 10*3/uL (ref 0.1–0.9)
MONO%: 7.9 % (ref 0.0–14.0)
NEUT#: 4.3 10*3/uL (ref 1.5–6.5)
NEUT%: 72.7 % (ref 39.0–75.0)
Platelets: 211 10*3/uL (ref 140–400)
RBC: 4.43 10*6/uL (ref 4.20–5.82)
RDW: 13.7 % (ref 11.0–14.6)
WBC: 5.9 10*3/uL (ref 4.0–10.3)
lymph#: 1 10*3/uL (ref 0.9–3.3)

## 2017-12-17 MED ORDER — SODIUM CHLORIDE 0.9% FLUSH
10.0000 mL | Freq: Once | INTRAVENOUS | Status: AC
  Administered 2017-12-17: 10 mL
  Filled 2017-12-17: qty 10

## 2017-12-17 MED ORDER — HEPARIN SOD (PORK) LOCK FLUSH 100 UNIT/ML IV SOLN
500.0000 [IU] | Freq: Once | INTRAVENOUS | Status: AC
  Administered 2017-12-17: 500 [IU] via INTRAVENOUS

## 2017-12-17 MED ORDER — HEPARIN SOD (PORK) LOCK FLUSH 100 UNIT/ML IV SOLN
INTRAVENOUS | Status: AC
  Filled 2017-12-17: qty 5

## 2017-12-17 MED ORDER — IOPAMIDOL (ISOVUE-300) INJECTION 61%
INTRAVENOUS | Status: AC
  Filled 2017-12-17: qty 100

## 2017-12-17 MED ORDER — IOPAMIDOL (ISOVUE-300) INJECTION 61%
100.0000 mL | Freq: Once | INTRAVENOUS | Status: AC | PRN
  Administered 2017-12-17: 100 mL via INTRAVENOUS

## 2017-12-17 NOTE — Patient Instructions (Signed)

## 2017-12-18 LAB — PSA: Prostate Specific Ag, Serum: 14 ng/mL — ABNORMAL HIGH (ref 0.0–4.0)

## 2017-12-23 ENCOUNTER — Other Ambulatory Visit: Payer: Self-pay | Admitting: *Deleted

## 2017-12-23 ENCOUNTER — Telehealth: Payer: Self-pay | Admitting: Oncology

## 2017-12-23 ENCOUNTER — Ambulatory Visit (HOSPITAL_BASED_OUTPATIENT_CLINIC_OR_DEPARTMENT_OTHER): Payer: PPO | Admitting: Oncology

## 2017-12-23 VITALS — BP 157/88 | HR 91 | Temp 98.4°F | Resp 16 | Ht 63.0 in | Wt 141.8 lb

## 2017-12-23 DIAGNOSIS — C787 Secondary malignant neoplasm of liver and intrahepatic bile duct: Secondary | ICD-10-CM | POA: Diagnosis not present

## 2017-12-23 DIAGNOSIS — N1330 Unspecified hydronephrosis: Secondary | ICD-10-CM | POA: Diagnosis not present

## 2017-12-23 DIAGNOSIS — C61 Malignant neoplasm of prostate: Secondary | ICD-10-CM

## 2017-12-23 MED ORDER — ALBUTEROL SULFATE HFA 108 (90 BASE) MCG/ACT IN AERS: 2.0000 | INHALATION_SPRAY | Freq: Four times a day (QID) | RESPIRATORY_TRACT | 3 refills | Status: DC | PRN

## 2017-12-23 MED ORDER — ABIRATERONE ACETATE 250 MG PO TABS: 1000.0000 mg | ORAL_TABLET | Freq: Every day | ORAL | 3 refills | Status: DC

## 2017-12-23 MED ORDER — ALBUTEROL SULFATE HFA 108 (90 BASE) MCG/ACT IN AERS: 2.0000 | INHALATION_SPRAY | Freq: Four times a day (QID) | RESPIRATORY_TRACT | 0 refills | Status: DC | PRN

## 2017-12-23 MED ORDER — PREDNISONE 5 MG PO TABS: 5.0000 mg | ORAL_TABLET | Freq: Two times a day (BID) | ORAL | 3 refills | Status: DC

## 2017-12-23 NOTE — Telephone Encounter (Signed)
Gave avs and calendar for February  °

## 2017-12-23 NOTE — Progress Notes (Signed)
Hematology and Oncology Follow Up Visit  [name] Johnston [id number] 1951-04-20 67 y.o. 12/23/2017 1:12 PM [name], MDDewey, [name], MD   Principle Diagnosis: 67 year old gentleman with prostate cancer diagnosed in 2014. He presented with obstructive symptoms and found to have a Gleason score 4+4 = 8 prostate cancer with PSA of around 40.  Liver mass diagnosed in December 2015 with an elevated AFP of 9.3. Biopsy proven to be prostate cancer.  Prior Therapy:  He is started [place] on 06/27/2013. He subsequently received definitive radiation therapy for a planned dose of 75 gray completed in November 2014 under the care of Dr. Tammi Klippel.  His PSA subsequently noted close to 0.33. PSA was up to 11 on 10/29/2014 with a castrate levels of testosterone of 19.   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  Docetaxel 75 mg/m given every 3 weeks with Neulasta support. He is status post 10 cycles of treatment concluded on 11/21/2015. He had an excellent PSA response with PSA dropping from 222 to 0.17.   Current therapy: Zytiga 1000 mg with prednisone since September 2017.   Interim History:  [name] Johnston presents today for a follow-up visit. Since his last visit, he reports recent complaints.  He is wheezing has been much less and uses rescue inhaler as needed. He continues to take Zytiga without any new complications. He denied any nausea, vomiting or lower extremity edema. He denied any excessive fatigue or tiredness. His performance status and activity level remained excellent.    He denies any changes in his appetite or his performance status.  His weight remains stable.  He denies any difficulties with urination but does report mild constipation at times.  He does not report any blurry vision or syncope. He does not report any fevers or chills.  He does not report any frequency urgency but does report dysuria and occasional hematuria. He  does not report any skeletal complaints of back pain shoulder pain or hip pain. He does not report any lymphadenopathy or petechiae. Remainder of his review of systems unremarkable.   Medications: I have reviewed the patient's current medications.  Current Outpatient Medications  Medication Sig Dispense Refill  . albuterol (VENTOLIN HFA) 108 (90 Base) MCG/ACT inhaler Inhale 2 puffs every 6 (six) hours as needed into the lungs for wheezing or shortness of breath. 1 Inhaler 3  . abiraterone acetate (ZYTIGA) 250 MG tablet Take 4 tablets (1,000 mg total) by mouth daily. Take on an empty stomach 1 hour before or 2 hours after a meal 120 tablet 3  . predniSONE (DELTASONE) 5 MG tablet Take 1 tablet (5 mg total) by mouth 2 (two) times daily with a meal. 60 tablet 3   No current facility-administered medications for this visit.      Allergies:  Allergies  Allergen Reactions  . Aspirin Hives    Past Medical History, Surgical history, Social history, and Family History were reviewed and updated.    Physical Exam: Blood pressure (!) 157/88, pulse 91, temperature 98.4 F (36.9 C), temperature source Oral, resp. rate 16, height 5\' 3"  (1.6 m), weight 141 lb 12.8 oz (64.3 kg), SpO2 97 %. ECOG: 1 General appearance: Alert, awake gentleman without distress. Head: Normocephalic, without obvious abnormality. No oral ulcers or lesions. Neck: no adenopathy no neck masses. Lymph nodes: Cervical, supraclavicular, and axillary nodes normal. Heart:regular rate and rhythm, S1, S2 normal, no murmur, click, rub or gallop  Chest wall examination: Port-A-Cath site appeared without  erythema or induration. Lung:chest clear, normal symmetric air entry.  No wheezing noted. Abdomin: soft, non-tender, without masses or organomegaly. No rebound or guarding. EXT: no edema noted or masses noted. Skin: No ecchymosis or petechiae.  Lab Results: Lab Results  Component Value Date   WBC 5.9 12/17/2017   HGB 12.0 (L)  12/17/2017   HCT 36.2 (L) 12/17/2017   MCV 81.6 12/17/2017   PLT 211 12/17/2017     Chemistry      Component Value Date/Time   NA 142 12/17/2017 1303   K 3.2 (L) 12/17/2017 1303   CL 102 04/15/2015 2219   CO2 28 12/17/2017 1303   BUN 14.0 12/17/2017 1303   CREATININE 1.1 12/17/2017 1303      Component Value Date/Time   CALCIUM 9.0 12/17/2017 1303   ALKPHOS 85 12/17/2017 1303   AST 12 12/17/2017 1303   ALT 15 12/17/2017 1303   BILITOT 0.41 12/17/2017 1303      Results for [name] Johnston, [name] Johnston (MRN [id number]) as of 12/23/2017 13:02  Ref. Range 08/24/2017 09:52 10/26/2017 08:12 12/17/2017 13:03  Prostate Specific Ag, Serum Latest Ref Range: 0.0 - 4.0 ng/mL 6.8 (H) 10.7 (H) 14.0 (H)     EXAM: CT ABDOMEN AND PELVIS WITH CONTRAST  TECHNIQUE: Multidetector CT imaging of the abdomen and pelvis was performed using the standard protocol following bolus administration of intravenous contrast.  CONTRAST:  148mL ISOVUE-300 IOPAMIDOL (ISOVUE-300) INJECTION 61%  COMPARISON:  CT 02/09/2017  FINDINGS: Lower chest: 13 x 12 mm RIGHT middle lobe pulmonary nodule compares to 15 x 14 mm for slight reduction.  Hepatobiliary: 2 low-density lesions in liver unchanged from comparison exam (image 18 and image 15 series 2). No new hepatic lesions. Gallbladder normal.  Pancreas: Pancreas is normal. No ductal dilatation. No pancreatic inflammation.  Spleen: Normal spleen  Adrenals/urinary tract: Adrenal glands are normal. Mild hydroureter and hydronephrosis on the LEFT. Small soft tissue nodule within the LEFT pelvis along the course of LEFT ureters measures 9 mm by 6 mm (image 73, series 2). This is presumably the obstructing lesion. More inferiorly nodular thickening along the peritoneal reflection measuring 11 mm by 6 mm  The bladder is normal  Stomach/Bowel: Stomach, small bowel, appendix, and cecum are normal. The colon and rectosigmoid colon are normal.  Vascular/Lymphatic:  Abdominal aorta is normal caliber. There is no retroperitoneal or periportal lymphadenopathy. No pelvic lymphadenopathy.  Reproductive: Post prostatectomy. Large LEFT hydrocele. There is an enhancing lesion within the LEFT hemiscrotum measuring 2.9 x 2.4 cm. This is partially imaged.  Other: No free fluid.  Musculoskeletal: No aggressive osseous lesion.  IMPRESSION: 1. New mild hydroureter and hydronephrosis on the LEFT. Small soft tissue peritoneal nodule along the course of LEFT ureter is presumably the source of obstruction and concerning for peritoneal metastasis. 2. RIGHT middle lobe pulmonary nodule slightly decreased in size. 3. Liver lesions unchanged. 4. Enhancing lesion within the LEFT hemiscrotum may represent benign vascular structure but is incompletely imaged. Large LEFT hydrocele. Recommend scrotal ultrasound for further evaluation.     Impression and Plan:  67 year old gentleman with the following issues:  1. Prostate cancer diagnosed in June 2014. He presented with obstructive symptoms and found to have a Gleason score 4+4 = 8 prostate cancer with PSA of around 40. He was treated with androgen deprivation and radiation therapy with an excellent initial response and a PSA nadir of 0.33. PSA was up to 19 and Casodex was added.  He developed progression of disease with PSA up to  222 with liver metastasis.  He is status post Taxotere chemotherapy with an excellent response by PSA criteria as well as CT scan.   His PSA increased up to 8.9 Started Zytiga with prednisone in September 2017.   He continues to tolerate this medication well without any signs or symptoms of progression of disease.  CT scan obtained on 12/17/2017 was personally reviewed and showed for the most part stable disease.  Risks and benefits of continuing this medication was reviewed and for the time being we will continue with the same dose and schedule.  Different salvage therapy may be  needed in the future if his PSA continues to rise.   2. Liver masses: biopsy-proven to be prostate cancer CT scan on 04/07/2016 showed continued response and decrease in the size. CT scan on 02/09/2017 showed continued regression of his liver masses.   CT scan on December 28 showed stable hepatic metastasis.  3.  IV access: Port-A-Cath maintained without complications. This will be flushed with the next visit in 6 weeks.  4. Hypokalemia: Potassium relatively stable on potassium supplements.  5. Prognosis: He does have an incurable malignancy but continues to respond very well to systemic treatment. His performance status continues to be excellent and aggressive therapy will be continued.  6.  Mild hydronephrosis: Kidney function appears normal and will continue to monitor moving forward.  7. Follow-up: Will be in 6 weeks to follow his progress.    Zola Button, MD 1/3/20191:12 PM

## 2017-12-24 ENCOUNTER — Other Ambulatory Visit: Payer: Self-pay | Admitting: *Deleted

## 2017-12-24 MED ORDER — ALBUTEROL SULFATE HFA 108 (90 BASE) MCG/ACT IN AERS: 2.0000 | INHALATION_SPRAY | Freq: Four times a day (QID) | RESPIRATORY_TRACT | 3 refills | Status: DC | PRN

## 2018-01-03 MED FILL — predniSONE 5 MG TABS: 5 | 30 days supply | Qty: 60 | Fill #3

## 2018-01-03 MED FILL — ZYTIGA 250 MG TABLET: 250 | 30 days supply | Qty: 120 | Fill #3

## 2018-01-20 ENCOUNTER — Emergency Department (HOSPITAL_COMMUNITY): Payer: PPO

## 2018-01-20 ENCOUNTER — Emergency Department (HOSPITAL_COMMUNITY)
Admission: EM | Admit: 2018-01-20 | Discharge: 2018-01-21 | Disposition: A | Discharge status: Home / Self Care | Payer: PPO | Attending: Emergency Medicine | Admitting: Emergency Medicine

## 2018-01-20 ENCOUNTER — Encounter (HOSPITAL_COMMUNITY): Payer: Self-pay

## 2018-01-20 ENCOUNTER — Other Ambulatory Visit: Payer: Self-pay

## 2018-01-20 DIAGNOSIS — J45909 Unspecified asthma, uncomplicated: Secondary | ICD-10-CM | POA: Insufficient documentation

## 2018-01-20 DIAGNOSIS — Z8551 Personal history of malignant neoplasm of bladder: Secondary | ICD-10-CM | POA: Diagnosis not present

## 2018-01-20 DIAGNOSIS — N5089 Other specified disorders of the male genital organs: Secondary | ICD-10-CM | POA: Diagnosis not present

## 2018-01-20 DIAGNOSIS — Z79899 Other long term (current) drug therapy: Secondary | ICD-10-CM | POA: Diagnosis not present

## 2018-01-20 DIAGNOSIS — N433 Hydrocele, unspecified: Secondary | ICD-10-CM | POA: Diagnosis not present

## 2018-01-20 DIAGNOSIS — N50812 Left testicular pain: Secondary | ICD-10-CM | POA: Diagnosis not present

## 2018-01-20 DIAGNOSIS — N509 Disorder of male genital organs, unspecified: Secondary | ICD-10-CM | POA: Diagnosis not present

## 2018-01-20 LAB — CBC WITH DIFFERENTIAL/PLATELET
BASOS ABS: 0 10*3/uL (ref 0.0–0.1)
Basophils Relative: 0 %
EOS ABS: 0.1 10*3/uL (ref 0.0–0.7)
EOS PCT: 1 %
HCT: 32.8 % — ABNORMAL LOW (ref 39.0–52.0)
Hemoglobin: 11.2 g/dL — ABNORMAL LOW (ref 13.0–17.0)
Lymphocytes Relative: 14 %
Lymphs Abs: 0.8 10*3/uL (ref 0.7–4.0)
MCH: 27.3 pg (ref 26.0–34.0)
MCHC: 34.1 g/dL (ref 30.0–36.0)
MCV: 79.8 fL (ref 78.0–100.0)
Monocytes Absolute: 0.5 10*3/uL (ref 0.1–1.0)
Monocytes Relative: 8 %
Neutro Abs: 4.6 10*3/uL (ref 1.7–7.7)
Neutrophils Relative %: 77 %
PLATELETS: 263 10*3/uL (ref 150–400)
RBC: 4.11 MIL/uL — AB (ref 4.22–5.81)
RDW: 12.8 % (ref 11.5–15.5)
WBC: 5.9 10*3/uL (ref 4.0–10.5)

## 2018-01-20 LAB — COMPREHENSIVE METABOLIC PANEL
ALT: 13 U/L — AB (ref 17–63)
AST: 14 U/L — AB (ref 15–41)
Albumin: 3.5 g/dL (ref 3.5–5.0)
Alkaline Phosphatase: 86 U/L (ref 38–126)
Anion gap: 7 (ref 5–15)
BILIRUBIN TOTAL: 0.6 mg/dL (ref 0.3–1.2)
BUN: 18 mg/dL (ref 6–20)
CO2: 26 mmol/L (ref 22–32)
CREATININE: 1.38 mg/dL — AB (ref 0.61–1.24)
Calcium: 9 mg/dL (ref 8.9–10.3)
Chloride: 105 mmol/L (ref 101–111)
GFR calc Af Amer: 59 mL/min — ABNORMAL LOW (ref >60)
GFR, EST NON AFRICAN AMERICAN: 51 mL/min — AB (ref >60)
Glucose, Bld: 105 mg/dL — ABNORMAL HIGH (ref 65–99)
POTASSIUM: 3.5 mmol/L (ref 3.5–5.1)
Sodium: 138 mmol/L (ref 135–145)
TOTAL PROTEIN: 7 g/dL (ref 6.5–8.1)

## 2018-01-20 LAB — URINALYSIS, ROUTINE W REFLEX MICROSCOPIC
BILIRUBIN URINE: NEGATIVE
Bacteria, UA: NONE SEEN
Glucose, UA: NEGATIVE mg/dL
KETONES UR: NEGATIVE mg/dL
LEUKOCYTES UA: NEGATIVE
Nitrite: NEGATIVE
PH: 6 (ref 5.0–8.0)
Protein, ur: NEGATIVE mg/dL
Specific Gravity, Urine: 1.014 (ref 1.005–1.030)

## 2018-01-20 MED ORDER — FENTANYL CITRATE (PF) 100 MCG/2ML IJ SOLN
50.0000 ug | Freq: Once | INTRAMUSCULAR | Status: AC
  Administered 2018-01-20: 50 ug via INTRAVENOUS
  Filled 2018-01-20: qty 2

## 2018-01-20 NOTE — ED Triage Notes (Signed)
Patient c/o left testicle pain and swelling x 2 weeks and has gotten worse in the past few days. Patient denies any proms with urination.

## 2018-01-20 NOTE — ED Notes (Signed)
Bed: FB90 Expected date:  Expected time:  Means of arrival:  Comments: Hold for [place]

## 2018-01-20 NOTE — ED Notes (Signed)
Pt reports an odor coming from the affected testicle. He reports more difficulty urinating then previously with the prostate CA. Pt is currently on chemo medication. x2 weeks. Pt reports no drainage. Pt denies fever, N/V, weakness or dizziness. Daughter is translating for the pt, the pt refused interpreter services.
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
## 2018-01-21 ENCOUNTER — Other Ambulatory Visit: Payer: Self-pay

## 2018-01-21 ENCOUNTER — Other Ambulatory Visit: Payer: Self-pay | Admitting: Urology

## 2018-01-21 ENCOUNTER — Encounter (HOSPITAL_BASED_OUTPATIENT_CLINIC_OR_DEPARTMENT_OTHER): Payer: Self-pay

## 2018-01-21 DIAGNOSIS — C7919 Secondary malignant neoplasm of other urinary organs: Secondary | ICD-10-CM | POA: Diagnosis not present

## 2018-01-21 DIAGNOSIS — N432 Other hydrocele: Secondary | ICD-10-CM | POA: Diagnosis not present

## 2018-01-21 DIAGNOSIS — N183 Chronic kidney disease, stage 3 (moderate): Secondary | ICD-10-CM | POA: Diagnosis not present

## 2018-01-21 DIAGNOSIS — N433 Hydrocele, unspecified: Secondary | ICD-10-CM | POA: Diagnosis not present

## 2018-01-21 DIAGNOSIS — N130 Hydronephrosis with ureteropelvic junction obstruction: Secondary | ICD-10-CM | POA: Diagnosis not present

## 2018-01-21 DIAGNOSIS — C61 Malignant neoplasm of prostate: Secondary | ICD-10-CM | POA: Diagnosis not present

## 2018-01-21 MED ORDER — HEPARIN SOD (PORK) LOCK FLUSH 100 UNIT/ML IV SOLN
500.0000 [IU] | Freq: Once | INTRAVENOUS | Status: AC
  Administered 2018-01-21: 500 [IU]
  Filled 2018-01-21: qty 5

## 2018-01-21 MED ORDER — OXYCODONE-ACETAMINOPHEN 5-325 MG PO TABS
1.0000 | ORAL_TABLET | Freq: Once | ORAL | Status: AC
  Administered 2018-01-21: 1 via ORAL
  Filled 2018-01-21: qty 1

## 2018-01-21 MED ORDER — OXYCODONE-ACETAMINOPHEN 5-325 MG PO TABS: 1.0000 | ORAL_TABLET | Freq: Four times a day (QID) | ORAL | 0 refills | Status: DC | PRN

## 2018-01-21 NOTE — Progress Notes (Signed)
Spoke with:  Ayun (daughter) NPO:  After Midnight, no gum, candy, or mints  Arrival time:  7:30AM Labs: CBC, CMP, UA 01/20/18 AM medications: None Pre op orders: Needs second sign Ride home:  Debroah Loop (daughter) [phone]  Refuses to use interpreting service daughter interprets for him.

## 2018-01-24 NOTE — H&P (Signed)
CC: I have swelling in my scrotum.  HPI: [name] Johnston is a 67 year-old male established patient who is here for scrotal swelling.  He first noticed his hydrocele 3 weeks ago. His hydrocele is on the left side. He does have pain on the side of his hydrocele.   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 He returns today with the onset about 3 weeks ago of left scrotal swelling and he now has some increased pain. A hydrocele with a hypervascular mass was partially visualized on CT done on 12/28 and a f/u US showed a large hydrocele with a mass in the left testicle suspicious for neoplasm.      CC: I have prostate cancer (treatment).  HPI: He is not participating in active surveillance. He has undergone Hormonal Therapy for treatment. The patient is taking lupron for hormonal manipulation. Patient denies zoladex, eligard, casodex, flutamide, trelstar, degarelix, and vantas. He was started on hormonal manipulation 06/27/2013. He is currently receiving hormonal manipulation. He did undergo chemotherapy for treatment of his prostate cancer.   His PSA blood tests have not been low since his prostate cancer treatment was started.   He does not have to strain or bear down to start his urinary stream.   [name] Johnston returns today for a hydrocele on the left but on a CT on 12/17/17 he had new onset left hydronephrosis with a soft tissue mass in the pelvis. he was also found to have an enhancing mass in the left scrotum with a large hydrocele. That was present but smaller in 02/09/17. His PSA has been rising and was up to 14 at last check on 12/28. his Cr is rising as well and was up to 1.11 on 12/28 from 0.8 2 months prior and it is now 1.38 on 01/08/18. He is due for Lupron on 01/25/18.   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 He is almost 2 years out from 10 cycles of Taxotere. He is currently on Zytiga. Fat was started in September 2017. In  February 2018 CT showed progression of hepatic metastasis. He has repeat imaging scheduled for early next year. PSA recently has been fairly stable in the 6-7 range. Last checked 1 month ago.     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 - 09/20/2017 Prostate Cancer - 09/20/2017, - 05/05/2017, - 12/28/2016, - 08/28/2016, Prostate cancer, - 04/27/2016, 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, - 08/19/2015, Prostate cancer, - 2016 Nocturia - 05/05/2017 Urinary Frequency, Increased urinary frequency - 04/27/2016 Dysuria, Dysuria - 08/19/2015 Other microscopic hematuria, Microscopic hematuria - 2016 Urethral Stricture, Unspec, Urethral stricture - 2016 Urinary Tract Inf, Unspec site, Pyuria - 2016, Urinary tract infection, - 2015 Urinary Retention, Unspec, Incomplete bladder emptying - 2015 Elevated PSA, Elevated prostate specific antigen (PSA) - 2014      PMH Notes: Past Gu Hx:   [name] Johnston has a history of prostate cancer. He had a cysto and TURP in June 2014 which revealed Gleason's 4+4 equals 8 adenocarcinoma involving approximate 70% of the resected prostate tissue. He had radiation and has been on hormonal therapy since  July 2014. Testosterone has confirmed to be at castrate levels. PSA was 0.33 in Feb 2015.    Despite the TURP in 2014 [name] Johnston continued to have voiding symptoms and eventually urinary retention. He was taken back to surgery for repeat endoscopic assessment in early February 2015. There was no evidence of stricture disease or bladder neck contracture. He did have some residual/additional tumor around the area of the bladder neck which was resected. This did show treatment change and had some viable Gleason's 8 cancer  involving about 10% of the tissue.    He has now been on hormonal therapy since 06/2013 months. Again, he presented with an aggressive, locally advanced, prostate cancer that was not grossly metastatic. Imaging studies in the summer of 2014, showed again locally advanced areas around the prostate, but no metastatic disease and bone scan was negative. He has undergone several transurethral resections and he has been doing better clinically. His PSA went down and nadired at 0.33 in February of 2015. In June , PSA was essentially unchanged at 0.4. Unfortunately, in November of 2015, PSA has gone back up to 11. I did recheck a testosterone level and it remained at castrate. He does remain on Lupron     NON-GU PMH: Encounter for general adult medical examination without abnormal findings, Encounter for preventive health examination - 04/27/2016 Asthma, Asthma - 2014    FAMILY HISTORY: Family Health Status - Father alive at age 67 - Father Family Health Status - Mother's Age - Mother Family Health Status Number - Runs In Family No Significant Family History - Runs In Family   SOCIAL HISTORY: Marital Status: Married Ethnicity: Not Hispanic Or Latino; Race: Unknown Current Smoking Status: Patient has never smoked.  Has never drank.  Does not drink caffeine.    REVIEW OF SYSTEMS:    GU Review Male:   Patient denies frequent urination, hard to postpone urination, burning/ pain with urination, get up at night to urinate, leakage of urine, stream starts and stops, trouble starting your stream, have to strain to urinate , erection problems, and penile pain.  Gastrointestinal (Upper):   Patient reports nausea. Patient denies vomiting and indigestion/ heartburn.  Gastrointestinal (Lower):   Patient denies diarrhea and constipation.  Constitutional:   Patient denies fever, night sweats, weight loss, and fatigue.  Skin:   Patient denies skin rash/ lesion and itching.  Eyes:   Patient denies double vision and  blurred vision.  Ears/ Nose/ Throat:   Patient denies sore throat and sinus problems.  Hematologic/Lymphatic:   Patient denies swollen glands and easy bruising.  Cardiovascular:   Patient reports leg swelling. Patient denies chest pains.  Respiratory:   Patient denies cough and shortness of breath.  Endocrine:   Patient denies excessive thirst.  Musculoskeletal:   Patient denies back pain and joint pain.  Neurological:   Patient denies headaches and dizziness.  Psychologic:   Patient denies depression and anxiety.   VITAL SIGNS:      01/21/2018 01:00 PM  Weight 142 lb / 64.41 kg  Height 62 in / 157.48 cm  BP 165/93 mmHg  Pulse 81 /min  Temperature 97.9 F / 36.6 C  BMI 26.0 kg/m   GU PHYSICAL EXAMINATION:    Scrotum: No lesions. No edema. No cysts. No warts.  Epididymides: Right: No spermatocele, no masses, no cysts, no tenderness, no induration, no enlargement. Left: not palpable in the hydrocele  Testes: 5+ cm hydrocele left testis. testicle is non-palpable. Hydrocele is tense and  extends up into the inguinal canal. Normal location left testis. Normal location right testis. No mass, no cyst, no varicocele left testis. No mass, no cyst, no varicocele, no hydrocele right testis.   Urethral Meatus: Normal size. No lesion, no wart, no discharge, no polyp. Normal location.  Penis: Circumcised, no warts, no cracks. No dorsal Peyronie's plaques, no left corporal Peyronie's plaques, no right corporal Peyronie's plaques, no scarring, no warts. No balanitis, no meatal stenosis.   MULTI-SYSTEM PHYSICAL EXAMINATION:    Constitutional: Well-nourished. No physical deformities. Normally developed. Good grooming.  Respiratory: No labored breathing, no use of accessory muscles. CTA  Cardiovascular: Normal temperature, RRR with murmur, no swelling, no varicosities.   Lymphatic: No enlargement, no tenderness of axillae, groin, neck lymph nodes.  Neurologic / Psychiatric: No focal deficits..    Gastrointestinal: No mass, no tenderness, no rigidity, non obese abdomen.  Musculoskeletal: Normal gait and station of head and neck.     PAST DATA REVIEWED:  Source Of History:  Patient  Lab Test Review:   PSA, CMP  Records Review:   Previous Doctor Records  Urine Test Review:   Urinalysis  X-Ray Review: Scrotal Ultrasound: Reviewed Films. Reviewed Report. Discussed With Patient. Large hydrocele with left testicular neoplasm, possible metastasis.  Renal Ultrasound (Limited): Reviewed Films. Discussed With Patient. Moderate left hydro today.  C.T. Abdomen/Pelvis: Reviewed Films. Reviewed Report. Discussed With Patient.     03/15/15 10/30/14 06/19/14 02/13/14 11/07/13 08/11/13 05/09/13  PSA  Total PSA 79.34  11.08  0.44  0.33  0.76  2.97  37.66     03/14/15 10/29/14 02/13/14 11/07/13 08/11/13  Hormones  Testosterone, Total '30  19  20  ' 33  30    Notes:                     Records from Oncology reviewed.    PROCEDURES:         Renal Ultrasound (Limited) - 75797  Left Kidney: Length: 11.9 cm Depth: 5.7 cm Cortical Width: 1.6 cm Width: 5.2 cm    Left Kidney/Ureter:  Hydronephrosis noted. The renal pelvis measures 1.6 cm. ------- 1.1 cm x 0.9 cm x 1.3 cm cystic area with no internal blood flow noted in the lower pole.   Bladder:  PVR = 36.4 cm. Bilateral jets were not visualized.                Urinalysis w/Scope Dipstick Dipstick Cont'd Micro  Color: Yellow Bilirubin: Neg WBC/hpf: NS (Not Seen)  Appearance: Clear Ketones: Neg RBC/hpf: NS (Not Seen)  Specific Gravity: 1.025 Blood: Neg Bacteria: Few (10-25/hpf)  pH: 6.0 Protein: 1+ Cystals: Amorph Phosphates  Glucose: Neg Urobilinogen: 0.2 Casts: NS (Not Seen)    Nitrites: Neg Trichomonas: Not Present    Leukocyte Esterase: Neg Mucous: Not Present      Epithelial Cells: 0 - 5/hpf      Yeast: Moderate (5 - 10/hpf)      Sperm: Not Present    ASSESSMENT:      ICD-10 Details  1 GU:   Prostate Cancer - C61 Worsening - He  has CRCP with progression on Zytiga and Lupron with prior docetaxal. 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  I am going to get him set up for a left hydrocelectomy and bilateral orchiectomy which will eliminate the need for lupron. I have reviewed the risks of bleeding, infection, recurrent hydrocele, thrombotic events and anesthetic complications.   4   Hydronephrosis - N13.0 He has malignant left ureteral obstruction with some pain and progressive AKI. I will do cystoscopy with stenting at the time of the hydrocelectomy. I reviewed this additional risks including inability to place a stent. I am not sure I would recommend a perc if I can't get a stent up with his progressive CRCP.   5   Chronic kidney disease stage 3 (GFR 30-60) - N18.3    PLAN:           Orders X-Rays: Renal Ultrasound (Limited) - Needs left RUS to assess degree of hydro.

## 2018-01-25 ENCOUNTER — Ambulatory Visit (HOSPITAL_COMMUNITY): Payer: PPO

## 2018-01-25 ENCOUNTER — Ambulatory Visit (HOSPITAL_BASED_OUTPATIENT_CLINIC_OR_DEPARTMENT_OTHER)
Admission: RE | Admit: 2018-01-25 | Discharge: 2018-01-25 | Disposition: A | Discharge status: Home / Self Care | Payer: PPO | Source: Ambulatory Visit | Attending: Urology | Admitting: Urology

## 2018-01-25 ENCOUNTER — Ambulatory Visit (HOSPITAL_BASED_OUTPATIENT_CLINIC_OR_DEPARTMENT_OTHER): Payer: PPO | Admitting: Anesthesiology

## 2018-01-25 ENCOUNTER — Other Ambulatory Visit: Payer: Self-pay

## 2018-01-25 ENCOUNTER — Encounter (HOSPITAL_BASED_OUTPATIENT_CLINIC_OR_DEPARTMENT_OTHER): Payer: Self-pay | Admitting: *Deleted

## 2018-01-25 ENCOUNTER — Encounter (HOSPITAL_BASED_OUTPATIENT_CLINIC_OR_DEPARTMENT_OTHER)
Admission: RE | Disposition: A | Discharge status: Home / Self Care | Payer: Self-pay | Source: Ambulatory Visit | Attending: Urology

## 2018-01-25 DIAGNOSIS — C7919 Secondary malignant neoplasm of other urinary organs: Secondary | ICD-10-CM | POA: Diagnosis not present

## 2018-01-25 DIAGNOSIS — Z886 Allergy status to analgesic agent status: Secondary | ICD-10-CM | POA: Insufficient documentation

## 2018-01-25 DIAGNOSIS — C61 Malignant neoplasm of prostate: Secondary | ICD-10-CM | POA: Insufficient documentation

## 2018-01-25 DIAGNOSIS — N131 Hydronephrosis with ureteral stricture, not elsewhere classified: Secondary | ICD-10-CM | POA: Insufficient documentation

## 2018-01-25 DIAGNOSIS — N430 Encysted hydrocele: Secondary | ICD-10-CM | POA: Diagnosis not present

## 2018-01-25 DIAGNOSIS — N135 Crossing vessel and stricture of ureter without hydronephrosis: Secondary | ICD-10-CM | POA: Diagnosis not present

## 2018-01-25 DIAGNOSIS — N130 Hydronephrosis with ureteropelvic junction obstruction: Secondary | ICD-10-CM | POA: Diagnosis not present

## 2018-01-25 DIAGNOSIS — R160 Hepatomegaly, not elsewhere classified: Secondary | ICD-10-CM | POA: Diagnosis not present

## 2018-01-25 DIAGNOSIS — Z79899 Other long term (current) drug therapy: Secondary | ICD-10-CM | POA: Insufficient documentation

## 2018-01-25 DIAGNOSIS — J45909 Unspecified asthma, uncomplicated: Secondary | ICD-10-CM | POA: Diagnosis not present

## 2018-01-25 DIAGNOSIS — N432 Other hydrocele: Secondary | ICD-10-CM | POA: Diagnosis not present

## 2018-01-25 DIAGNOSIS — C787 Secondary malignant neoplasm of liver and intrahepatic bile duct: Secondary | ICD-10-CM | POA: Diagnosis not present

## 2018-01-25 DIAGNOSIS — N433 Hydrocele, unspecified: Secondary | ICD-10-CM | POA: Insufficient documentation

## 2018-01-25 DIAGNOSIS — N183 Chronic kidney disease, stage 3 (moderate): Secondary | ICD-10-CM | POA: Diagnosis not present

## 2018-01-25 DIAGNOSIS — C7982 Secondary malignant neoplasm of genital organs: Secondary | ICD-10-CM | POA: Insufficient documentation

## 2018-01-25 DIAGNOSIS — Z8551 Personal history of malignant neoplasm of bladder: Secondary | ICD-10-CM | POA: Diagnosis not present

## 2018-01-25 HISTORY — PX: ORCHIECTOMY: SHX2116

## 2018-01-25 HISTORY — DX: Gastro-esophageal reflux disease without esophagitis: K21.9

## 2018-01-25 HISTORY — DX: Presence of other vascular implants and grafts: Z95.828

## 2018-01-25 HISTORY — PX: CYSTOSCOPY W/ URETERAL STENT PLACEMENT: SHX1429

## 2018-01-25 HISTORY — DX: Solitary pulmonary nodule: R91.1

## 2018-01-25 HISTORY — DX: Unspecified hydronephrosis: N13.30

## 2018-01-25 HISTORY — DX: Other hemorrhoids: K64.8

## 2018-01-25 HISTORY — PX: HYDROCELE EXCISION: SHX482

## 2018-01-25 SURGERY — HYDROCELECTOMY
Anesthesia: General | Site: Ureter | Laterality: Left

## 2018-01-25 MED ORDER — EPHEDRINE SULFATE-NACL 50-0.9 MG/10ML-% IV SOSY
PREFILLED_SYRINGE | INTRAVENOUS | Status: DC | PRN
  Administered 2018-01-25: 10 mg via INTRAVENOUS

## 2018-01-25 MED ORDER — CEFAZOLIN SODIUM-DEXTROSE 2-4 GM/100ML-% IV SOLN
INTRAVENOUS | Status: AC
  Filled 2018-01-25: qty 100

## 2018-01-25 MED ORDER — OXYCODONE HCL 5 MG PO TABS
ORAL_TABLET | ORAL | Status: AC
Start: 2018-01-25 — End: 2018-01-25
  Filled 2018-01-25: qty 1

## 2018-01-25 MED ORDER — ACETAMINOPHEN 500 MG PO TABS
ORAL_TABLET | ORAL | Status: AC
  Filled 2018-01-25: qty 2

## 2018-01-25 MED ORDER — ONDANSETRON HCL 4 MG/2ML IJ SOLN
INTRAMUSCULAR | Status: AC
  Filled 2018-01-25: qty 2

## 2018-01-25 MED ORDER — ACETAMINOPHEN 650 MG RE SUPP
650.0000 mg | RECTAL | Status: DC | PRN
  Filled 2018-01-25: qty 1

## 2018-01-25 MED ORDER — SODIUM CHLORIDE 0.9% FLUSH
3.0000 mL | Freq: Two times a day (BID) | INTRAVENOUS | Status: DC
  Filled 2018-01-25: qty 3

## 2018-01-25 MED ORDER — FENTANYL CITRATE (PF) 100 MCG/2ML IJ SOLN
INTRAMUSCULAR | Status: DC | PRN
  Administered 2018-01-25: 25 ug via INTRAVENOUS
  Administered 2018-01-25 (×2): 50 ug via INTRAVENOUS

## 2018-01-25 MED ORDER — LIDOCAINE 2% (20 MG/ML) 5 ML SYRINGE
INTRAMUSCULAR | Status: AC
  Filled 2018-01-25: qty 5

## 2018-01-25 MED ORDER — FENTANYL CITRATE (PF) 100 MCG/2ML IJ SOLN
INTRAMUSCULAR | Status: AC
  Filled 2018-01-25: qty 2

## 2018-01-25 MED ORDER — FENTANYL CITRATE (PF) 100 MCG/2ML IJ SOLN
25.0000 ug | INTRAMUSCULAR | Status: DC | PRN
  Administered 2018-01-25 (×2): 25 ug via INTRAVENOUS
  Administered 2018-01-25: 50 ug via INTRAVENOUS
  Filled 2018-01-25: qty 1

## 2018-01-25 MED ORDER — ONDANSETRON HCL 4 MG/2ML IJ SOLN
4.0000 mg | Freq: Once | INTRAMUSCULAR | Status: DC | PRN
  Filled 2018-01-25: qty 2

## 2018-01-25 MED ORDER — LIDOCAINE 2% (20 MG/ML) 5 ML SYRINGE
INTRAMUSCULAR | Status: DC | PRN
  Administered 2018-01-25: 50 mg via INTRAVENOUS

## 2018-01-25 MED ORDER — DEXAMETHASONE SODIUM PHOSPHATE 4 MG/ML IJ SOLN
INTRAMUSCULAR | Status: DC | PRN
  Administered 2018-01-25: 10 mg via INTRAVENOUS

## 2018-01-25 MED ORDER — ACETAMINOPHEN 500 MG PO TABS
1000.0000 mg | ORAL_TABLET | Freq: Once | ORAL | Status: AC
  Administered 2018-01-25: 1000 mg via ORAL
  Filled 2018-01-25: qty 2

## 2018-01-25 MED ORDER — DEXAMETHASONE SODIUM PHOSPHATE 10 MG/ML IJ SOLN
INTRAMUSCULAR | Status: AC
  Filled 2018-01-25: qty 1

## 2018-01-25 MED ORDER — SODIUM CHLORIDE 0.9% FLUSH
3.0000 mL | INTRAVENOUS | Status: DC | PRN
  Filled 2018-01-25: qty 3

## 2018-01-25 MED ORDER — OXYCODONE HCL 5 MG PO TABS
5.0000 mg | ORAL_TABLET | ORAL | Status: DC | PRN
  Filled 2018-01-25: qty 2

## 2018-01-25 MED ORDER — PROPOFOL 10 MG/ML IV BOLUS
INTRAVENOUS | Status: DC | PRN
  Administered 2018-01-25: 100 mg via INTRAVENOUS

## 2018-01-25 MED ORDER — IOHEXOL 300 MG/ML  SOLN
INTRAMUSCULAR | Status: DC | PRN
  Administered 2018-01-25: 10 mL

## 2018-01-25 MED ORDER — MORPHINE SULFATE (PF) 2 MG/ML IV SOLN
2.0000 mg | INTRAVENOUS | Status: DC | PRN
  Filled 2018-01-25: qty 1

## 2018-01-25 MED ORDER — EPHEDRINE 5 MG/ML INJ
INTRAVENOUS | Status: AC
  Filled 2018-01-25: qty 10

## 2018-01-25 MED ORDER — TRAMADOL HCL 50 MG PO TABS: 50.0000 mg | ORAL_TABLET | Freq: Four times a day (QID) | ORAL | 0 refills | Status: DC | PRN

## 2018-01-25 MED ORDER — CEFAZOLIN SODIUM-DEXTROSE 2-4 GM/100ML-% IV SOLN
2.0000 g | INTRAVENOUS | Status: AC
  Administered 2018-01-25: 2 g via INTRAVENOUS
  Filled 2018-01-25: qty 100

## 2018-01-25 MED ORDER — SODIUM CHLORIDE 0.9 % IV SOLN
250.0000 mL | INTRAVENOUS | Status: DC | PRN
  Filled 2018-01-25: qty 250

## 2018-01-25 MED ORDER — SODIUM CHLORIDE 0.9 % IR SOLN
Status: DC | PRN
  Administered 2018-01-25: 3000 mL
  Administered 2018-01-25: 500 mL

## 2018-01-25 MED ORDER — BUPIVACAINE HCL (PF) 0.25 % IJ SOLN
INTRAMUSCULAR | Status: DC | PRN
  Administered 2018-01-25: 10 mL

## 2018-01-25 MED ORDER — LACTATED RINGERS IV SOLN
INTRAVENOUS | Status: DC
  Administered 2018-01-25: 09:00:00 via INTRAVENOUS
  Filled 2018-01-25: qty 1000

## 2018-01-25 MED ORDER — ONDANSETRON HCL 4 MG/2ML IJ SOLN
INTRAMUSCULAR | Status: DC | PRN
  Administered 2018-01-25: 4 mg via INTRAVENOUS

## 2018-01-25 MED ORDER — PROPOFOL 10 MG/ML IV BOLUS
INTRAVENOUS | Status: AC
  Filled 2018-01-25: qty 20

## 2018-01-25 MED ORDER — STERILE WATER FOR IRRIGATION IR SOLN
Status: DC | PRN
  Administered 2018-01-25: 500 mL

## 2018-01-25 MED ORDER — ACETAMINOPHEN 325 MG PO TABS
650.0000 mg | ORAL_TABLET | ORAL | Status: DC | PRN
  Filled 2018-01-25: qty 2

## 2018-01-25 SURGICAL SUPPLY — 75 items
APPLICATOR COTTON TIP 6IN STRL (MISCELLANEOUS) IMPLANT
BAG DRAIN URO-CYSTO SKYTR STRL (DRAIN) ×4 IMPLANT
BAG URINE DRAINAGE (UROLOGICAL SUPPLIES) ×4 IMPLANT
BASKET STONE 1.7 NGAGE (UROLOGICAL SUPPLIES) IMPLANT
BASKET ZERO TIP NITINOL 2.4FR (BASKET) IMPLANT
BLADE CLIPPER SURG (BLADE) ×4 IMPLANT
BLADE SURG 15 STRL LF DISP TIS (BLADE) ×3 IMPLANT
BLADE SURG 15 STRL SS (BLADE) ×1
BNDG GAUZE ELAST 4 BULKY (GAUZE/BANDAGES/DRESSINGS) ×4 IMPLANT
CANISTER SUCT 3000ML PPV (MISCELLANEOUS) IMPLANT
CANISTER SUCTION 1200CC (MISCELLANEOUS) IMPLANT
CATH COUDE FOLEY 2W 5CC 16FR (CATHETERS) ×4 IMPLANT
CATH URET 5FR 28IN CONE TIP (BALLOONS)
CATH URET 5FR 28IN OPEN ENDED (CATHETERS) ×4 IMPLANT
CATH URET 5FR 70CM CONE TIP (BALLOONS) IMPLANT
CLEANER CAUTERY TIP 5X5 PAD (MISCELLANEOUS) ×3 IMPLANT
CLOTH BEACON ORANGE TIMEOUT ST (SAFETY) ×4 IMPLANT
COVER BACK TABLE 60X90IN (DRAPES) ×4 IMPLANT
COVER MAYO STAND STRL (DRAPES) ×4 IMPLANT
DERMABOND ADVANCED (GAUZE/BANDAGES/DRESSINGS) ×1
DERMABOND ADVANCED .7 DNX12 (GAUZE/BANDAGES/DRESSINGS) ×3 IMPLANT
DISSECTOR ROUND CHERRY 3/8 STR (MISCELLANEOUS) IMPLANT
DRAIN PENROSE 18X1/4 LTX STRL (WOUND CARE) ×4 IMPLANT
DRAPE LAPAROTOMY 100X72 PEDS (DRAPES) ×4 IMPLANT
DRAPE LAPAROTOMY TRNSV 102X78 (DRAPE) IMPLANT
DRSG TEGADERM 4X4.75 (GAUZE/BANDAGES/DRESSINGS) IMPLANT
ELECT NEEDLE TIP 2.8 STRL (NEEDLE) IMPLANT
ELECT REM PT RETURN 9FT ADLT (ELECTROSURGICAL) ×4
ELECTRODE REM PT RTRN 9FT ADLT (ELECTROSURGICAL) ×3 IMPLANT
FIBER LASER FLEXIVA 365 (UROLOGICAL SUPPLIES) IMPLANT
FIBER LASER TRAC TIP (UROLOGICAL SUPPLIES) IMPLANT
GAUZE SPONGE 4X4 12PLY STRL (GAUZE/BANDAGES/DRESSINGS) ×4 IMPLANT
GLOVE BIO SURGEON STRL SZ 6.5 (GLOVE) ×8 IMPLANT
GLOVE BIO SURGEON STRL SZ7.5 (GLOVE) ×12 IMPLANT
GLOVE BIOGEL PI IND STRL 6.5 (GLOVE) ×9 IMPLANT
GLOVE BIOGEL PI IND STRL 7.5 (GLOVE) ×9 IMPLANT
GLOVE BIOGEL PI INDICATOR 6.5 (GLOVE) ×3
GLOVE BIOGEL PI INDICATOR 7.5 (GLOVE) ×3
GLOVE SURG SS PI 8.0 STRL IVOR (GLOVE) ×8 IMPLANT
GOWN STRL REUS W/ TWL LRG LVL3 (GOWN DISPOSABLE) ×3 IMPLANT
GOWN STRL REUS W/ TWL XL LVL3 (GOWN DISPOSABLE) ×3 IMPLANT
GOWN STRL REUS W/TWL LRG LVL3 (GOWN DISPOSABLE) ×9 IMPLANT
GOWN STRL REUS W/TWL XL LVL3 (GOWN DISPOSABLE) ×9 IMPLANT
GUIDEWIRE 0.038 PTFE COATED (WIRE) IMPLANT
GUIDEWIRE ANG ZIPWIRE 038X150 (WIRE) IMPLANT
GUIDEWIRE STR DUAL SENSOR (WIRE) ×4 IMPLANT
INFUSOR MANOMETER BAG 3000ML (MISCELLANEOUS) ×4 IMPLANT
IV NS IRRIG 3000ML ARTHROMATIC (IV SOLUTION) ×4 IMPLANT
KIT RM TURNOVER CYSTO AR (KITS) ×4 IMPLANT
MANIFOLD NEPTUNE II (INSTRUMENTS) ×4 IMPLANT
NEEDLE HYPO 22GX1.5 SAFETY (NEEDLE) ×4 IMPLANT
NEEDLE HYPO 25X1 1.5 SAFETY (NEEDLE) ×4 IMPLANT
NS IRRIG 500ML POUR BTL (IV SOLUTION) ×4 IMPLANT
PACK BASIN DAY SURGERY FS (CUSTOM PROCEDURE TRAY) ×4 IMPLANT
PACK CYSTO (CUSTOM PROCEDURE TRAY) ×4 IMPLANT
PAD CLEANER CAUTERY TIP 5X5 (MISCELLANEOUS) ×1
PENCIL BUTTON HOLSTER BLD 10FT (ELECTRODE) ×4 IMPLANT
SET COLLECT BLD 21X3/4 12 (NEEDLE) IMPLANT
STENT URET 6FRX24 CONTOUR (STENTS) ×4 IMPLANT
SUPPORT SCROTAL LG STRP (MISCELLANEOUS) IMPLANT
SUPPORT SCROTAL MED ADLT STRP (MISCELLANEOUS) ×4 IMPLANT
SUT CHROMIC 3 0 SH 27 (SUTURE) ×8 IMPLANT
SUT CHROMIC GUT AB #0 18 (SUTURE) IMPLANT
SUT SILK 3 0 TIES 17X18 (SUTURE)
SUT SILK 3-0 18XBRD TIE BLK (SUTURE) IMPLANT
SUT VICRYL 2 0 18  UND BR (SUTURE)
SUT VICRYL 2 0 18 UND BR (SUTURE) IMPLANT
SYR 20CC LL (SYRINGE) ×4 IMPLANT
SYR BULB IRRIGATION 50ML (SYRINGE) ×4 IMPLANT
SYR CONTROL 10ML LL (SYRINGE) ×4 IMPLANT
TOWEL OR 17X24 6PK STRL BLUE (TOWEL DISPOSABLE) ×8 IMPLANT
TRAY DSU PREP LF (CUSTOM PROCEDURE TRAY) ×4 IMPLANT
TUBE CONNECTING 12X1/4 (SUCTIONS) ×4 IMPLANT
WATER STERILE IRR 500ML POUR (IV SOLUTION) ×4 IMPLANT
YANKAUER SUCT BULB TIP NO VENT (SUCTIONS) ×4 IMPLANT

## 2018-01-25 NOTE — Interval H&P Note (Signed)
History and Physical Interval Note:  01/25/2018 10:02 AM  [name] Johnston  has presented today for surgery, with the diagnosis of LEFT HYDROCELE WITH TESTICULAR MASS, LEFT HYDRONEPHROSIS  The various methods of treatment have been discussed with the patient and family. After consideration of risks, benefits and other options for treatment, the patient has consented to  Procedure(s): HYDROCELECTOMY ADULT (Left) ORCHIECTOMY (Bilateral) CYSTOSCOPY WITH LEFT RETROGRADE [name] STENT PLACEMENT (Left) as a surgical intervention .  The patient's history has been reviewed, patient examined, no change in status, stable for surgery.  I have reviewed the patient's chart and labs.  Questions were answered to the patient's satisfaction.     [name]

## 2018-01-25 NOTE — Anesthesia Preprocedure Evaluation (Addendum)
Anesthesia Evaluation  Patient identified by MRN, date of birth, ID band Patient awake    Reviewed: Allergy & Precautions, NPO status , Patient's Chart, lab work & pertinent test results  Airway Mallampati: II  TM Distance: <3 FB Neck ROM: Full    Dental  (+) Teeth Intact, Dental Advisory Given   Pulmonary asthma ,    Pulmonary exam normal breath sounds clear to auscultation       Cardiovascular negative cardio ROS Normal cardiovascular exam Rhythm:Regular Rate:Normal     Neuro/Psych Chronic SDH s/p Left BHWO negative psych ROS   GI/Hepatic Neg liver ROS, GERD  ,  Endo/Other  negative endocrine ROS  Renal/GU Renal diseasenegative Renal ROS   Prostate cancer Bladder cancer    Musculoskeletal negative musculoskeletal ROS (+)   Abdominal   Peds  Hematology  (+) Blood dyscrasia, anemia ,   Anesthesia Other Findings Day of surgery medications reviewed with the patient.  Reproductive/Obstetrics                           Anesthesia Physical Anesthesia Plan  ASA: III  Anesthesia Plan: General   Post-op Pain Management:    Induction: Intravenous  PONV Risk Score and Plan: 3 and Midazolam, Dexamethasone and Ondansetron  Airway Management Planned: LMA  Additional Equipment:   Intra-op Plan:   Post-operative Plan: Extubation in OR  Informed Consent: I have reviewed the patients History and Physical, chart, labs and discussed the procedure including the risks, benefits and alternatives for the proposed anesthesia with the patient or authorized representative who has indicated his/her understanding and acceptance.   Dental advisory given  Plan Discussed with: CRNA  Anesthesia Plan Comments: (Risks/benefits of general anesthesia discussed with patient including risk of damage to teeth, lips, gum, and tongue, nausea/vomiting, allergic reactions to medications, and the possibility of  heart attack, stroke and death.  All patient questions answered.  Patient wishes to proceed.)        Anesthesia Quick Evaluation

## 2018-01-25 NOTE — Discharge Instructions (Addendum)
May remove catheter Wednesday morning 01/26/2018. Post Anesthesia Home Care Instructions  Activity: Get plenty of rest for the remainder of the day. A responsible individual must stay with you for 24 hours following the procedure.  For the next 24 hours, DO NOT: -Drive a car -Paediatric nurse -Drink alcoholic beverages -Take any medication unless instructed by your physician -Make any legal decisions or sign important papers.  Meals: Start with liquid foods such as gelatin or soup. Progress to regular foods as tolerated. Avoid greasy, spicy, heavy foods. If nausea and/or vomiting occur, drink only clear liquids until the nausea and/or vomiting subsides. Call your physician if vomiting continues.  Special Instructions/Symptoms: Your throat may feel dry or sore from the anesthesia or the breathing tube placed in your throat during surgery. If this causes discomfort, gargle with warm salt water. The discomfort should disappear within 24 hours.  If you had a scopolamine patch placed behind your ear for the management of post- operative nausea and/or vomiting:  1. The medication in the patch is effective for 72 hours, after which it should be removed.  Wrap patch in a tissue and discard in the trash. Wash hands thoroughly with soap and water. 2. You may remove the patch earlier than 72 hours if you experience unpleasant side effects which may include dry mouth, dizziness or visual disturbances. 3. Avoid touching the patch. Wash your hands with soap and water after contact with the patch.    HOME CARE INSTRUCTIONS FOR SCROTAL PROCEDURES  Wound Care & Hygiene: You may apply an ice bag to the scrotum for the first 24 hours.  This may help decrease swelling and soreness.  You may have a dressing held in place by an athletic supporter.  You may remove the dressing in 24 hours and shower in 48 hours.  Continue to use the athletic supporter or tight briefs for at least a week. Activity: Rest today -  not necessarily flat bed rest.  Just take it easy.  You should not do strenuous activities until your follow-up visit with your doctor.  You may resume light activity in 48 hours.  Return to Work:  Your doctor will advise you of this depending on the type of work you do  Diet: Drink liquids or eat a light diet this evening.  You may resume a regular diet tomorrow.  General Expectations: You may have a small amount of bleeding.  The scrotum may be swollen or bruised for about a week.  Call your Doctor if these occur:  -persistent or heavy bleeding  -temperature of 101 degrees or more  -severe pain, not relieved by your pain medication  Return to [employer]:  Call to set up and appointment.  You may remove the foley at home in the morning.    Ureteral Stent Implantation, Care After Refer to this sheet in the next few weeks. These instructions provide you with information about caring for yourself after your procedure. Your health care provider may also give you more specific instructions. Your treatment has been planned according to current medical practices, but problems sometimes occur. Call your health care provider if you have any problems or questions after your procedure. What can I expect after the procedure? After the procedure, it is common to have:  Nausea.  Mild pain when you urinate. You may feel this pain in your lower back or lower abdomen. Pain should stop within a few minutes after you urinate. This may last for up to 1 week.  A small amount of blood in your urine for several days.  Follow these instructions at home:  Medicines  Take over-the-counter and prescription medicines only as told by your health care provider.  If you were prescribed an antibiotic medicine, take it as told by your health care provider. Do not stop taking the antibiotic even if you start to feel better.  Do not drive for 24 hours if you received a sedative.  Do not drive or  operate heavy machinery while taking prescription pain medicines. Activity  Return to your normal activities as told by your health care provider. Ask your health care provider what activities are safe for you.  Do not lift anything that is heavier than 10 lb (4.5 kg). Follow this limit for 1 week after your procedure, or for as long as told by your health care provider. General instructions  Watch for any blood in your urine. Call your health care provider if the amount of blood in your urine increases.  If you have a catheter: ? Follow instructions from your health care provider about taking care of your catheter and collection bag. ? Do not take baths, swim, or use a hot tub until your health care provider approves.  Drink enough fluid to keep your urine clear or pale yellow.  Keep all follow-up visits as told by your health care provider. This is important. Contact a health care provider if:  You have pain that gets worse or does not get better with medicine, especially pain when you urinate.  You have difficulty urinating.  You feel nauseous or you vomit repeatedly during a period of more than 2 days after the procedure. Get help right away if:  Your urine is dark red or has blood clots in it.  You are leaking urine (have incontinence).  The end of the stent comes out of your urethra.  You cannot urinate.  You have sudden, sharp, or severe pain in your abdomen or lower back.  You have a fever.   This information is not intended to replace advice given to you by your health care provider. Make sure you discuss any questions you have with your health care provider. Document Released: 08/09/2013 Document Revised: 05/14/2016 Document Reviewed: 06/21/2015 Elsevier Interactive Patient Education  [employer].

## 2018-01-25 NOTE — Transfer of Care (Signed)
Last Vitals:  Vitals:   01/25/18 0729  BP: (!) 171/95  Pulse: 78  Resp: 14  Temp: 37 C  SpO2: 99%    Last Pain:  Vitals:   01/25/18 0803  TempSrc:   PainSc: 8       Patients Stated Pain Goal: 9 (01/25/18 1610) Immediate Anesthesia Transfer of Care Note  Patient: [name] Johnston  Procedure(s) Performed: Procedure(s) (LRB): HYDROCELECTOMY ADULT (Left) ORCHIECTOMY (Bilateral) CYSTOSCOPY WITH LEFT RETROGRADE [name] STENT PLACEMENT (Left)  Patient Location: PACU  Anesthesia Type: General  Level of Consciousness: awake, alert  and oriented  Airway & Oxygen Therapy: Patient Spontanous Breathing and Patient connected to nasal cannula oxygen  Post-op Assessment: Report given to PACU RN and Post -op Vital signs reviewed and stable  Post vital signs: Reviewed and stable  Complications: No apparent anesthesia complications

## 2018-01-25 NOTE — Anesthesia Postprocedure Evaluation (Signed)
Anesthesia Post Note  Patient: [name] Johnston  Procedure(s) Performed: HYDROCELECTOMY ADULT (Left Scrotum) ORCHIECTOMY (Bilateral Scrotum) CYSTOSCOPY WITH LEFT RETROGRADE [name] STENT PLACEMENT (Left Ureter)     Patient location during evaluation: PACU Anesthesia Type: General Level of consciousness: awake and alert Pain management: pain level controlled Vital Signs Assessment: post-procedure vital signs reviewed and stable Respiratory status: spontaneous breathing, nonlabored ventilation and respiratory function stable Cardiovascular status: blood pressure returned to baseline and stable Postop Assessment: no apparent nausea or vomiting Anesthetic complications: no    Last Vitals:  Vitals:   01/25/18 1230 01/25/18 1242  BP: (!) 182/89   Pulse: 66 66  Resp: 10 12  Temp:    SpO2: 100% 100%    Last Pain:  Vitals:   01/25/18 1242  TempSrc:   PainSc: 7                  Catalina Gravel

## 2018-01-25 NOTE — Anesthesia Procedure Notes (Signed)
Procedure Name: LMA Insertion Date/Time: 01/25/2018 10:29 AM Performed by: Catalina Gravel, MD Pre-anesthesia Checklist: Patient identified, Emergency Drugs available, Suction available and Patient being monitored Patient Re-evaluated:Patient Re-evaluated prior to induction Oxygen Delivery Method: Circle system utilized Preoxygenation: Pre-oxygenation with 100% oxygen Induction Type: IV induction Ventilation: Mask ventilation without difficulty LMA: LMA inserted LMA Size: 4.0 Number of attempts: 1 Airway Equipment and Method: Bite block Placement Confirmation: positive ETCO2 Tube secured with: Tape Dental Injury: Teeth and Oropharynx as per pre-operative assessment

## 2018-01-25 NOTE — Op Note (Signed)
Procedure: 1.  Cystoscopy with urethral dilation. 2.  Left retrograde pyelogram with interpretation. 3.  Insertion of left double-J stent. 4.  Bilateral scrotal orchiectomy with left hydrocelectomy.  Preop diagnosis 1.  Left ureteral obstruction. 2.  Left testicular mass with symptomatic hydrocele.  3 castrate resistant prostate cancer.  Postop diagnosis: Same.  Surgeon: Dr. [name].  Anesthesia: General.  Specimen: 1.  Left testicle and hydrocele sac. 2.  Right testicle.  Drain: 1.  6 [place] by 24 cm left contour double-J stent. 2.  16 French Foley catheter.  Blood loss: Minimal.  Complications: None.  Indications: The patient is a 67 year old [place] male who has a history of castrate resistant metastatic prostate cancer.  He presented to the office with progressive left scrotal swelling with pain and had had a recent CT scan which demonstrated a large left hydrocele with a hypervascular left testicular mass and also left hydronephrosis from an apparent metastasis from prostate cancer in the pelvis.  It was felt that bilateral orchiectomy with left hydrocelectomy and insertion of left double-J stent were indicated.  Procedure: He was given Ancef.  A general anesthetic was induced.  He was placed in the lithotomy position and fitted with PAS hose.  His scrotum was clipped and he was prepped with Betadine solution.  He was draped in usual sterile fashion.  Cystoscopy was attempted with a 38 [place] scope and 30 degree lens.  Initial attempted passage was unsuccessful due to narrowing of the urethra and the proximal anterior portion without a distinct stricture.  The urethra was then dilated to 26 [place] with [employer].  The 64 [place] scope was then successfully passed.  Inspection revealed an otherwise normal urethra.  The external sphincter was intact.  The prostatic urethra had evidence of a prior transurethral resection but was quite fixed with a high bladder neck.  I was  able to get the scope over the bladder neck and inspection of the bladder demonstrated a smooth wall without tumor stones or inflammation.  Ureteral orifices were small but otherwise unremarkable.  The left ureteral orifice was cannulated with a 5 French opening catheter and contrast was instilled.  Left retrograde pyelogram demonstrated a delicate but normal caliber ureter for proximally 5 or 6 cm.  There was then an area of stricturing approximately 2-3 cm in length with proximal hydronephrosis but no filling defects.  Sensor guidewire was passed through the open end Catheterand successfully advanced across the stricture to the kidney under fluoroscopic guidance.  The open-end catheter was then removed and a 6 French 24 cm contour double-J stent was passed over the wire to the kidney under fluoroscopic guidance without difficulty.  Wire was removed leaving good coil in the bladder and a good coil in the kidney.  The cystoscope was removed and a 16 French coud Foley catheter was inserted.  The balloon was filled with 10 mL of fluid and the catheter was placed to straight drainage.  The patient was then taken down from the lithotomy position and left in the supine position.  The Foley cath was secured on the anterior abdominal wall.  He was re-prepped with Betadine solution and redraped.  An anterior midline scrotal incision was then made with a knife and carried down through the dartos to the hydrocele sac on the left with the Bovie.  The hydrocele sac was delivered into the wound.  The sac was opened and drained.  I then dissected back to the proximal cord which was divided  into 2 packets and doubly ligated using 0 Vicryl ties.  The specimen was handed off in the cord stump and scrotum were inspected for hemostasis.  No active bleeding was identified.  I then exposed the right testicle through the median raphae and dissected back to the cord which was divided into 2 packets which were doubly ligated  with 0 Vicryl ties and the specimen was removed.  Final inspection revealed no active bleeding within the scrotum.  The wound was closed in 2 layers with a running deep dartos layer of 3-0 chromic and the skin layer of running vertical mattress 3-0 chromic.  The wound was cleansed and sealed with Dermabond.  A dressing of 4 x 4's, a fluffed Kerlix and scrotal support was then applied.  His anesthetic was reversed.  He was moved to recovery room in stable condition.  There were no complications.

## 2018-01-26 ENCOUNTER — Encounter (HOSPITAL_BASED_OUTPATIENT_CLINIC_OR_DEPARTMENT_OTHER): Payer: Self-pay | Admitting: Urology

## 2018-02-02 ENCOUNTER — Other Ambulatory Visit: Payer: Self-pay

## 2018-02-02 ENCOUNTER — Encounter (HOSPITAL_COMMUNITY): Payer: Self-pay | Admitting: Emergency Medicine

## 2018-02-02 DIAGNOSIS — R42 Dizziness and giddiness: Secondary | ICD-10-CM | POA: Insufficient documentation

## 2018-02-02 DIAGNOSIS — I10 Essential (primary) hypertension: Secondary | ICD-10-CM | POA: Diagnosis not present

## 2018-02-02 DIAGNOSIS — Z79899 Other long term (current) drug therapy: Secondary | ICD-10-CM | POA: Insufficient documentation

## 2018-02-02 DIAGNOSIS — J45909 Unspecified asthma, uncomplicated: Secondary | ICD-10-CM | POA: Insufficient documentation

## 2018-02-02 NOTE — ED Triage Notes (Signed)
Pt to ED with son with concern for new onset HTN - pt has never formally been dx with high blood pressure, but states that he has heard he has border line hypertension. Patient also reports intermittent dizziness when his BP gets too high upon walking long distances. Patient ambulatory to room and back to waiting area without difficulty, denies dizziness at this time. Patient sts that he just bought a BP machine for his home and got a reading of 158/83. Denies headache, no N/V, no vision changes or lightheadedness.

## 2018-02-03 ENCOUNTER — Emergency Department (HOSPITAL_COMMUNITY)
Admission: EM | Admit: 2018-02-03 | Discharge: 2018-02-03 | Disposition: A | Discharge status: Home / Self Care | Payer: PPO | Attending: Emergency Medicine | Admitting: Emergency Medicine

## 2018-02-03 ENCOUNTER — Other Ambulatory Visit: Payer: Self-pay | Admitting: Oncology

## 2018-02-03 ENCOUNTER — Emergency Department (HOSPITAL_COMMUNITY): Payer: PPO

## 2018-02-03 DIAGNOSIS — I10 Essential (primary) hypertension: Secondary | ICD-10-CM

## 2018-02-03 DIAGNOSIS — R42 Dizziness and giddiness: Secondary | ICD-10-CM | POA: Diagnosis not present

## 2018-02-03 LAB — CBC WITH DIFFERENTIAL/PLATELET
BASOS PCT: 1 %
Basophils Absolute: 0 10*3/uL (ref 0.0–0.1)
EOS ABS: 0.3 10*3/uL (ref 0.0–0.7)
EOS PCT: 5 %
HCT: 35.6 % — ABNORMAL LOW (ref 39.0–52.0)
Hemoglobin: 11.7 g/dL — ABNORMAL LOW (ref 13.0–17.0)
LYMPHS ABS: 1.2 10*3/uL (ref 0.7–4.0)
Lymphocytes Relative: 20 %
MCH: 27 pg (ref 26.0–34.0)
MCHC: 32.9 g/dL (ref 30.0–36.0)
MCV: 82 fL (ref 78.0–100.0)
MONOS PCT: 7 %
Monocytes Absolute: 0.4 10*3/uL (ref 0.1–1.0)
Neutro Abs: 4 10*3/uL (ref 1.7–7.7)
Neutrophils Relative %: 67 %
Platelets: 239 10*3/uL (ref 150–400)
RBC: 4.34 MIL/uL (ref 4.22–5.81)
RDW: 13.1 % (ref 11.5–15.5)
WBC: 5.8 10*3/uL (ref 4.0–10.5)

## 2018-02-03 LAB — BASIC METABOLIC PANEL
Anion gap: 11 (ref 5–15)
BUN: 8 mg/dL (ref 6–20)
CHLORIDE: 102 mmol/L (ref 101–111)
CO2: 26 mmol/L (ref 22–32)
CREATININE: 0.97 mg/dL (ref 0.61–1.24)
Calcium: 9.1 mg/dL (ref 8.9–10.3)
GFR calc Af Amer: >60 mL/min (ref >60)
GFR calc non Af Amer: >60 mL/min (ref >60)
Glucose, Bld: 94 mg/dL (ref 65–99)
POTASSIUM: 3.5 mmol/L (ref 3.5–5.1)
Sodium: 139 mmol/L (ref 135–145)

## 2018-02-03 LAB — I-STAT CHEM 8, ED
BUN: 11 mg/dL (ref 6–20)
CHLORIDE: 102 mmol/L (ref 101–111)
CREATININE: 0.9 mg/dL (ref 0.61–1.24)
Calcium, Ion: 1.18 mmol/L (ref 1.15–1.40)
GLUCOSE: 91 mg/dL (ref 65–99)
HCT: 34 % — ABNORMAL LOW (ref 39.0–52.0)
Hemoglobin: 11.6 g/dL — ABNORMAL LOW (ref 13.0–17.0)
POTASSIUM: 3.6 mmol/L (ref 3.5–5.1)
Sodium: 140 mmol/L (ref 135–145)
TCO2: 27 mmol/L (ref 22–32)

## 2018-02-03 LAB — I-STAT TROPONIN, ED: TROPONIN I, POC: 0 ng/mL (ref 0.00–0.08)

## 2018-02-03 MED FILL — predniSONE 5 MG TABS: 5 | 30 days supply | Qty: 60 | Fill #0

## 2018-02-03 NOTE — Discharge Instructions (Signed)
Your work up was negative. Please see your doctor on Monday for follow up on your blood pressure.
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
## 2018-02-03 NOTE — ED Notes (Signed)
ED Provider at bedside. 

## 2018-02-03 NOTE — ED Notes (Signed)
Patient transported to X-ray 

## 2018-02-03 NOTE — ED Notes (Signed)
Attempted blood draw for labs, without success. Pt refusing any additional attempts. Insisting on drawing from implanted port. Will access port to draw from, per request.

## 2018-02-03 NOTE — ED Notes (Signed)
Port flushed with NS and discontinued.

## 2018-02-04 MED FILL — ABIRATERONE ACETATE 250 MG: 250 | 30 days supply | Qty: 120 | Fill #0

## 2018-02-09 ENCOUNTER — Inpatient Hospital Stay: Payer: PPO | Attending: Oncology

## 2018-02-09 ENCOUNTER — Inpatient Hospital Stay: Payer: PPO

## 2018-02-09 DIAGNOSIS — C61 Malignant neoplasm of prostate: Secondary | ICD-10-CM

## 2018-02-09 DIAGNOSIS — C787 Secondary malignant neoplasm of liver and intrahepatic bile duct: Secondary | ICD-10-CM | POA: Diagnosis not present

## 2018-02-09 DIAGNOSIS — E876 Hypokalemia: Secondary | ICD-10-CM | POA: Insufficient documentation

## 2018-02-09 DIAGNOSIS — Z95828 Presence of other vascular implants and grafts: Secondary | ICD-10-CM

## 2018-02-09 DIAGNOSIS — C7800 Secondary malignant neoplasm of unspecified lung: Secondary | ICD-10-CM | POA: Diagnosis not present

## 2018-02-09 DIAGNOSIS — R300 Dysuria: Secondary | ICD-10-CM | POA: Diagnosis not present

## 2018-02-09 LAB — COMPREHENSIVE METABOLIC PANEL
ALK PHOS: 99 U/L (ref 40–150)
ALT: 11 U/L (ref 0–55)
AST: 14 U/L (ref 5–34)
Albumin: 3.4 g/dL — ABNORMAL LOW (ref 3.5–5.0)
Anion gap: 10 (ref 3–11)
BUN: 13 mg/dL (ref 7–26)
CALCIUM: 9.5 mg/dL (ref 8.4–10.4)
CO2: 26 mmol/L (ref 22–29)
CREATININE: 0.88 mg/dL (ref 0.70–1.30)
Chloride: 105 mmol/L (ref 98–109)
Glucose, Bld: 90 mg/dL (ref 70–140)
Potassium: 3.6 mmol/L (ref 3.5–5.1)
SODIUM: 141 mmol/L (ref 136–145)
Total Bilirubin: 0.5 mg/dL (ref 0.2–1.2)
Total Protein: 7.3 g/dL (ref 6.4–8.3)

## 2018-02-09 LAB — CBC WITH DIFFERENTIAL/PLATELET
BASOS PCT: 1 %
Basophils Absolute: 0 10*3/uL (ref 0.0–0.1)
EOS ABS: 0.3 10*3/uL (ref 0.0–0.5)
EOS PCT: 5 %
HCT: 36.3 % — ABNORMAL LOW (ref 38.4–49.9)
HEMOGLOBIN: 11.8 g/dL — AB (ref 13.0–17.1)
Lymphocytes Relative: 17 %
Lymphs Abs: 1 10*3/uL (ref 0.9–3.3)
MCH: 26.7 pg — ABNORMAL LOW (ref 27.2–33.4)
MCHC: 32.5 g/dL (ref 32.0–36.0)
MCV: 82.1 fL (ref 79.3–98.0)
MONOS PCT: 8 %
Monocytes Absolute: 0.5 10*3/uL (ref 0.1–0.9)
NEUTROS PCT: 69 %
Neutro Abs: 4.2 10*3/uL (ref 1.5–6.5)
PLATELETS: 224 10*3/uL (ref 140–400)
RBC: 4.42 MIL/uL (ref 4.20–5.82)
RDW: 13.1 % (ref 11.0–14.6)
WBC: 6 10*3/uL (ref 4.0–10.3)

## 2018-02-09 MED ORDER — HEPARIN SOD (PORK) LOCK FLUSH 100 UNIT/ML IV SOLN
500.0000 [IU] | Freq: Once | INTRAVENOUS | Status: AC
  Administered 2018-02-09: 500 [IU]
  Filled 2018-02-09: qty 5

## 2018-02-09 MED ORDER — SODIUM CHLORIDE 0.9% FLUSH
10.0000 mL | Freq: Once | INTRAVENOUS | Status: AC
  Administered 2018-02-09: 10 mL
  Filled 2018-02-09: qty 10

## 2018-02-10 LAB — PROSTATE-SPECIFIC AG, SERUM (LABCORP): PROSTATE SPECIFIC AG, SERUM: 3.7 ng/mL (ref 0.0–4.0)

## 2018-02-11 ENCOUNTER — Telehealth: Payer: Self-pay | Admitting: Oncology

## 2018-02-11 ENCOUNTER — Inpatient Hospital Stay (HOSPITAL_BASED_OUTPATIENT_CLINIC_OR_DEPARTMENT_OTHER): Payer: PPO | Admitting: Oncology

## 2018-02-11 VITALS — BP 114/77 | HR 87 | Temp 98.7°F | Resp 18 | Ht 62.0 in | Wt 136.6 lb

## 2018-02-11 DIAGNOSIS — E876 Hypokalemia: Secondary | ICD-10-CM

## 2018-02-11 DIAGNOSIS — C787 Secondary malignant neoplasm of liver and intrahepatic bile duct: Secondary | ICD-10-CM | POA: Diagnosis not present

## 2018-02-11 DIAGNOSIS — C61 Malignant neoplasm of prostate: Secondary | ICD-10-CM | POA: Diagnosis not present

## 2018-02-11 DIAGNOSIS — E291 Testicular hypofunction: Secondary | ICD-10-CM

## 2018-02-11 DIAGNOSIS — C7800 Secondary malignant neoplasm of unspecified lung: Secondary | ICD-10-CM | POA: Diagnosis not present

## 2018-02-11 NOTE — Telephone Encounter (Signed)
Scheduled appt per 2/252 los - Gave patient AVS and calender per los.

## 2018-02-11 NOTE — Progress Notes (Signed)
Hematology and Oncology Follow Up Visit  [name] Johnston [id number] 07-13-1951 68 y.o. 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  He was initially diagnosed in 2014 with Gleason score 4+4 = 8 prostate cancer with PSA of around 40.  He developed advanced disease with liver biopsy documented that in 2015.   Prior Therapy:  He is started [place] on 06/27/2013. He subsequently received definitive radiation therapy for a planned dose of 75 gray completed in November 2014 under the care of Dr. Tammi Klippel.   He developed castration resistant disease with biopsy-proven liver metastasis in May 2016.  Docetaxel 75 mg/m given every 3 weeks with Neulasta support. He is status post 10 cycles of treatment concluded on 11/21/2015. He had an excellent PSA response with PSA dropping from 222 to 0.17.   PSA started to rise again with PSA up to 8.9 in August 2017.  He s status post orchiectomy completed in February 2019.  Current therapy: Zytiga 1000 mg with prednisone since September 2017.   Interim History:  [name] is here for a follow-up with an interpreter.  He reports feeling reasonably fair without any major changes to health since last visit.  He underwent bilateral orchiectomy done in February 2019 under the care of Dr. Jeffie Pollock without complications.    He was seen in the emergency department last week for complaints of dizziness and chest pain.  His evaluation was unrevealing including EKG and a chest x-ray as well as laboratory testing.  The symptoms have resolved at this time.  He has been taking Zytiga without any major complications.  He denies any nausea, vomiting or excessive fatigue.  He ran out of his medication and has been off of it for the last week.  He denies any worsening lower extremity edema when he was taking it.  He denies any bone pain or  pathological fractures.  He denies any decline in his energy or performance status.  He continues to be active and attending to activities of daily living including caring for his grandchildren.  He does not report any blurry vision or syncope. He does not report any fevers or chills.  His appetite been about the same and weight is stable.  He does not report any chest pain, palpitation, orthopnea or leg edema.  He does not report any cough, wheezing or hemoptysis.  He does not report any frequency urgency and no hematuria.  He does not report any skeletal complaints of back pain shoulder pain or hip pain. He does not report any lymphadenopathy or petechiae.  He does not report any heat or cold intolerance.  He does not report any skin rashes or lesions.  He does report periodic anxiety and depression.  Remainder of his review of systems is negative.  Medications: I have reviewed the patient's current medications.  Remain unchanged at this time.  Although he has not been taking Zytiga for the last week. Current Outpatient Medications  Medication Sig Dispense Refill  . abiraterone acetate (ZYTIGA) 250 MG tablet Take 4 tablets (1,000 mg total) by mouth daily. Take on an empty stomach 1 hour before or 2 hours after a meal 120 tablet 3  . albuterol (PROAIR HFA) 108 (90 Base) MCG/ACT inhaler Inhale 2 puffs into the lungs every 6 (six) hours as needed for wheezing or shortness of breath. 1 Inhaler 3  . oxyCODONE-acetaminophen (PERCOCET/ROXICET)  5-325 MG tablet Take 1 tablet by mouth every 6 (six) hours as needed for severe pain. 20 tablet 0  . predniSONE (DELTASONE) 5 MG tablet Take 1 tablet (5 mg total) by mouth 2 (two) times daily with a meal. 60 tablet 3  . traMADol (ULTRAM) 50 MG tablet Take 1 tablet (50 mg total) by mouth every 6 (six) hours as needed. (Patient taking differently: Take 50 mg by mouth every 6 (six) hours as needed for moderate pain. ) 12 tablet 0  . ZYTIGA 250 MG tablet TAKE 4 TABLETS  (1,000 MG TOTAL) BY MOUTH DAILY. TAKE ON AN EMPTY STOMACH 1 HOUR BEFORE OR 2 HOURS AFTER A MEAL 120 tablet 3   No current facility-administered medications for this visit.      Allergies:  Allergies  Allergen Reactions  . Aspirin Hives    Past Medical History, Surgical history, Social history, and Family History were reviewed and updated.    Physical Exam: Blood pressure 114/77, pulse 87, temperature 98.7 F (37.1 C), temperature source Oral, resp. rate 18, height 5\' 2"  (1.575 m), weight 136 lb 9.6 oz (62 kg), SpO2 100 %. ECOG: 1 General appearance: Well-appearing gentleman appeared comfortable. Head: Normocephalic, without obvious abnormality.  Oropharynx: No oral ulcers or lesions.  Lucas membranes are moist and pink. Neck: no adenopathy no neck masses. Lymph nodes: Cervical, supraclavicular, and axillary nodes normal. Heart: Regular rate and rhythm without murmurs rubs or gallops. Lung: Clear without rhonchi, wheezes or dullness to percussion. Abdomin: Soft, nontender nondistended with good bowel sounds.  No rebound or guarding. Musculoskeletal: No joint deformity or effusion. Skin: No ecchymosis or petechiae.  No rashes or lesions.  Port-A-Cath site appeared without erythema or induration. Neurological: No motor, sensory deficits.  Lab Results: Lab Results  Component Value Date   WBC 6.0 02/09/2018   HGB 11.8 (L) 02/09/2018   HCT 36.3 (L) 02/09/2018   MCV 82.1 02/09/2018   PLT 224 02/09/2018     Chemistry      Component Value Date/Time   NA 141 02/09/2018 0948   NA 142 12/17/2017 1303   K 3.6 02/09/2018 0948   K 3.2 (L) 12/17/2017 1303   CL 105 02/09/2018 0948   CO2 26 02/09/2018 0948   CO2 28 12/17/2017 1303   BUN 13 02/09/2018 0948   BUN 14.0 12/17/2017 1303   CREATININE 0.88 02/09/2018 0948   CREATININE 1.1 12/17/2017 1303      Component Value Date/Time   CALCIUM 9.5 02/09/2018 0948   CALCIUM 9.0 12/17/2017 1303   ALKPHOS 99 02/09/2018 0948   ALKPHOS  85 12/17/2017 1303   AST 14 02/09/2018 0948   AST 12 12/17/2017 1303   ALT 11 02/09/2018 0948   ALT 15 12/17/2017 1303   BILITOT 0.5 02/09/2018 0948   BILITOT 0.41 12/17/2017 1303      Results for IRL, BODIE (MRN [id number]) as of 02/11/2018 12:16  Ref. Range 12/17/2017 13:03 02/09/2018 09:48  Prostate Specific Ag, Serum Latest Ref Range: 0.0 - 4.0 ng/mL 14.0 (H) 3.7     Impression and Plan:  67 year old gentleman with the following issues:  1.  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 He developed hepatic metastasis and PSA up to 222 in 2015.    He is status post Taxotere chemotherapy with an excellent response by PSA criteria as well as CT scan.   His PSA increased up to 8.9 Started Zytiga with prednisone in September 2017.   The natural course of  this disease was reviewed again with the patient via interpreter.  He continues to do reasonably well on Zytiga with excellent response to therapy.  His PSA did decrease further to 3.7 in February 2019.  This is likely the effect of Zytiga and most recently his orchiectomy.  Risks and benefits of continuing Zytiga was discussed today is agreeable to continue.   2.  Hepatic metastasis: This is documented to be prostate cancer etiology. CT scan on December 28 showed stable disease   3.  IV access: Port-A-Cath will be flushed every 6 weeks without complications.  4. Hypokalemia: His levels continue to be on normal range with replacement.  We will continue to monitor given his [name] can cause hypokalemia.  5. Prognosis: Has been discussed with him and his family on multiple occasions.  He understand he has an incurable malignancy but his performance status remains excellent.  6.  Mild hydronephrosis: Creatinine remained stable with good voiding.  We will continue to monitor moving forward.  7.  Androgen depravation: His testosterone level has been a castrate.  He is status post orchiectomy in February  2019.  8. Follow-up: Will be in 6 weeks to follow his progress.  25  minutes was spent with the patient face-to-face today.  More than 50% of time was dedicated to patient counseling, education and coordination of his multifaceted care.    Zola Button, MD 2/22/201912:14 PM

## 2018-03-01 ENCOUNTER — Other Ambulatory Visit: Payer: Self-pay | Admitting: *Deleted

## 2018-03-01 NOTE — Patient Outreach (Signed)
Screening call for HTA high risk pt. I talked with pt's daughter, Dorthula Matas and advised her of [facility] services. I also asked if she is aware of her father's end of life wishes, which she said she does not know. She requested information about [facility] and Advanced Directives which I will send her. I have encouraged her to have her father complete the Advanced Directives and give his MD a copy. Also to call me if they would like [place] [facility] services.  Eulah Pont. Myrtie Neither, MSN, [facility] Gerontological Nurse Practitioner [facility] Care Management [phone]

## 2018-03-10 MED FILL — ABIRATERONE ACETATE 250 MG: 250 | 30 days supply | Qty: 120 | Fill #1

## 2018-03-10 MED FILL — predniSONE 5 MG TABS: 5 | 30 days supply | Qty: 60 | Fill #1

## 2018-03-18 ENCOUNTER — Telehealth: Payer: Self-pay

## 2018-03-18 ENCOUNTER — Inpatient Hospital Stay: Payer: PPO | Attending: Oncology

## 2018-03-18 ENCOUNTER — Inpatient Hospital Stay: Payer: PPO

## 2018-03-18 ENCOUNTER — Other Ambulatory Visit: Payer: Self-pay | Admitting: Oncology

## 2018-03-18 DIAGNOSIS — C61 Malignant neoplasm of prostate: Secondary | ICD-10-CM | POA: Insufficient documentation

## 2018-03-18 DIAGNOSIS — S065XAA Traumatic subdural hemorrhage with loss of consciousness status unknown, initial encounter: Secondary | ICD-10-CM

## 2018-03-18 DIAGNOSIS — C787 Secondary malignant neoplasm of liver and intrahepatic bile duct: Secondary | ICD-10-CM | POA: Insufficient documentation

## 2018-03-18 DIAGNOSIS — Z95828 Presence of other vascular implants and grafts: Secondary | ICD-10-CM

## 2018-03-18 DIAGNOSIS — S065X9A Traumatic subdural hemorrhage with loss of consciousness of unspecified duration, initial encounter: Secondary | ICD-10-CM

## 2018-03-18 LAB — CMP (CANCER CENTER ONLY)
ALBUMIN: 3.4 g/dL — AB (ref 3.5–5.0)
ALK PHOS: 101 U/L (ref 40–150)
ALT: 11 U/L (ref 0–55)
AST: 11 U/L (ref 5–34)
Anion gap: 9 (ref 3–11)
BILIRUBIN TOTAL: 0.5 mg/dL (ref 0.2–1.2)
BUN: 14 mg/dL (ref 7–26)
CO2: 27 mmol/L (ref 22–29)
CREATININE: 0.87 mg/dL (ref 0.70–1.30)
Calcium: 9.4 mg/dL (ref 8.4–10.4)
Chloride: 105 mmol/L (ref 98–109)
GFR, Est AFR Am: >60 mL/min (ref >60)
GFR, Estimated: >60 mL/min (ref >60)
GLUCOSE: 117 mg/dL (ref 70–140)
POTASSIUM: 3.1 mmol/L — AB (ref 3.5–5.1)
Sodium: 141 mmol/L (ref 136–145)
TOTAL PROTEIN: 7.1 g/dL (ref 6.4–8.3)

## 2018-03-18 LAB — CBC WITH DIFFERENTIAL (CANCER CENTER ONLY)
BASOS ABS: 0 10*3/uL (ref 0.0–0.1)
Basophils Relative: 0 %
EOS ABS: 0.1 10*3/uL (ref 0.0–0.5)
EOS PCT: 2 %
HCT: 35.6 % — ABNORMAL LOW (ref 38.4–49.9)
Hemoglobin: 11.7 g/dL — ABNORMAL LOW (ref 13.0–17.1)
LYMPHS ABS: 1 10*3/uL (ref 0.9–3.3)
LYMPHS PCT: 16 %
MCH: 27.2 pg (ref 27.2–33.4)
MCHC: 32.9 g/dL (ref 32.0–36.0)
MCV: 82.8 fL (ref 79.3–98.0)
MONO ABS: 0.4 10*3/uL (ref 0.1–0.9)
Monocytes Relative: 7 %
Neutro Abs: 4.6 10*3/uL (ref 1.5–6.5)
Neutrophils Relative %: 75 %
PLATELETS: 230 10*3/uL (ref 140–400)
RBC: 4.3 MIL/uL (ref 4.20–5.82)
RDW: 13.6 % (ref 11.0–14.6)
WBC Count: 6.2 10*3/uL (ref 4.0–10.3)

## 2018-03-18 MED ORDER — HEPARIN SOD (PORK) LOCK FLUSH 100 UNIT/ML IV SOLN
500.0000 [IU] | Freq: Once | INTRAVENOUS | Status: AC
  Administered 2018-03-18: 500 [IU]
  Filled 2018-03-18: qty 5

## 2018-03-18 MED ORDER — SODIUM CHLORIDE 0.9% FLUSH
10.0000 mL | Freq: Once | INTRAVENOUS | Status: AC
  Administered 2018-03-18: 10 mL
  Filled 2018-03-18: qty 10

## 2018-03-18 NOTE — Telephone Encounter (Signed)
Per patient return stating that someone called him to return to clinic for additional labs. Talked with Shadad to confirm information. It was okayed by provider so I scheduled patient  for labs (add on) today and also arrived patient. Per 3/29 return patients

## 2018-03-19 LAB — PROSTATE-SPECIFIC AG, SERUM (LABCORP): PROSTATE SPECIFIC AG, SERUM: 4.5 ng/mL — AB (ref 0.0–4.0)

## 2018-03-23 ENCOUNTER — Inpatient Hospital Stay: Payer: PPO | Attending: Oncology | Admitting: Oncology

## 2018-03-23 ENCOUNTER — Telehealth: Payer: Self-pay | Admitting: Internal Medicine

## 2018-03-23 VITALS — BP 134/92 | HR 85 | Temp 98.4°F | Resp 17 | Ht 62.0 in | Wt 139.2 lb

## 2018-03-23 DIAGNOSIS — C787 Secondary malignant neoplasm of liver and intrahepatic bile duct: Secondary | ICD-10-CM

## 2018-03-23 DIAGNOSIS — E876 Hypokalemia: Secondary | ICD-10-CM | POA: Insufficient documentation

## 2018-03-23 DIAGNOSIS — C61 Malignant neoplasm of prostate: Secondary | ICD-10-CM

## 2018-03-23 DIAGNOSIS — E291 Testicular hypofunction: Secondary | ICD-10-CM | POA: Diagnosis not present

## 2018-03-23 DIAGNOSIS — N1330 Unspecified hydronephrosis: Secondary | ICD-10-CM | POA: Insufficient documentation

## 2018-03-23 NOTE — Telephone Encounter (Signed)
Appointments scheduled AVS/Calendar printed per 4/3 los °

## 2018-03-23 NOTE — Progress Notes (Signed)
Hematology and Oncology Follow Up Visit  [name] Johnston [id number] 1951-11-29 67 y.o. 03/23/2018 12:52 PM [name] Johnston, MDDewey, [name], MD   Principle Diagnosis: 67 year old man with castration resistant prostate cancer initially diagnosed in 2014 with Gleason score 4+4 = 8 prostate cancer with PSA of around 40.  He has documented disease to the liver and is biopsy-proven.   Prior Therapy:  He is started [place] on 06/27/2013. He subsequently received definitive radiation therapy for a planned dose of 75 gray completed in November 2014 under the care of Dr. Tammi Klippel.   He developed castration resistant disease with biopsy-proven liver metastasis in May 2016.  Docetaxel 75 mg/m given every 3 weeks with Neulasta support. He is status post 10 cycles of treatment concluded on 11/21/2015. He had an excellent PSA response with PSA dropping from 222 to 0.17.   PSA started to rise again with PSA up to 8.9 in August 2017.  He s status post orchiectomy completed in February 2019.  Current therapy: Zytiga 1000 mg with prednisone since September 2017.   Interim History:  [name] is here for a follow-up visit.  He reports no major changes in his health since last visit.  He continues to take Zytiga without any major complications.  He denies any nausea, fatigue or edema.  His appetite and performance status remains stable.  He denies any complications related to his Port-A-Cath at this time.  His quality of life and performance status remains excellent.   He does not report any blurry vision or syncope. He does not report any fevers or chills.   He does not report any chest pain, palpitation, orthopnea or leg edema.  He does not report any cough, wheezing or hemoptysis.  He does not report any frequency urgency and no hematuria.  He does not report any skeletal complaints of back pain shoulder pain or hip pain. He does not report any lymphadenopathy or petechiae.  He does not report any heat or  cold intolerance.  He does not report any skin rashes or lesions.  He does report periodic anxiety and depression.  Remainder of his review of systems is negative.  Medications: I have reviewed the patient's current medications.  Remain unchanged at this time.  Although he has not been taking Zytiga for the last week. Current Outpatient Medications  Medication Sig Dispense Refill  . abiraterone acetate (ZYTIGA) 250 MG tablet Take 4 tablets (1,000 mg total) by mouth daily. Take on an empty stomach 1 hour before or 2 hours after a meal 120 tablet 3  . albuterol (PROAIR HFA) 108 (90 Base) MCG/ACT inhaler Inhale 2 puffs into the lungs every 6 (six) hours as needed for wheezing or shortness of breath. 1 Inhaler 3  . oxyCODONE-acetaminophen (PERCOCET/ROXICET) 5-325 MG tablet Take 1 tablet by mouth every 6 (six) hours as needed for severe pain. 20 tablet 0  . predniSONE (DELTASONE) 5 MG tablet Take 1 tablet (5 mg total) by mouth 2 (two) times daily with a meal. 60 tablet 3  . traMADol (ULTRAM) 50 MG tablet Take 1 tablet (50 mg total) by mouth every 6 (six) hours as needed. (Patient taking differently: Take 50 mg by mouth every 6 (six) hours as needed for moderate pain. ) 12 tablet 0  . ZYTIGA 250 MG tablet TAKE 4 TABLETS (1,000 MG TOTAL) BY MOUTH DAILY. TAKE ON AN EMPTY STOMACH 1 HOUR BEFORE OR 2 HOURS AFTER A MEAL 120 tablet 3   No current facility-administered medications for  this visit.      Allergies:  Allergies  Allergen Reactions  . Aspirin Hives    Past Medical History, Surgical history, Social history, and Family History were reviewed and updated.    Physical Exam: Blood pressure (!) 134/92, pulse 85, temperature 98.4 F (36.9 C), temperature source Oral, resp. rate 17, height 5\' 2"  (1.575 m), weight 139 lb 3.2 oz (63.1 kg), SpO2 100 %.   ECOG: 1 General appearance: Alert, awake gentleman without distress. Head: Atraumatic abnormalities. Oropharynx: Mucosa is moist and pink. Eyes:  No scleral icterus. Lymph nodes: Cervical, supraclavicular, and axillary nodes normal. Heart: Regular rate without any murmurs or gallops. Lung: Clear without any wheezes or dullness to percussion. Abdomin: Soft, without any rebound or guarding.  No shifting dullness or ascites. Musculoskeletal: No clubbing or cyanosis. Skin: No petechia or ecchymosis. Neurological: No deficits noted.   Lab Results: Lab Results  Component Value Date   WBC 6.2 03/18/2018   HGB 11.8 (L) 02/09/2018   HCT 35.6 (L) 03/18/2018   MCV 82.8 03/18/2018   PLT 230 03/18/2018     Chemistry      Component Value Date/Time   NA 141 03/18/2018 1206   NA 142 12/17/2017 1303   K 3.1 (L) 03/18/2018 1206   K 3.2 (L) 12/17/2017 1303   CL 105 03/18/2018 1206   CO2 27 03/18/2018 1206   CO2 28 12/17/2017 1303   BUN 14 03/18/2018 1206   BUN 14.0 12/17/2017 1303   CREATININE 0.87 03/18/2018 1206   CREATININE 1.1 12/17/2017 1303      Component Value Date/Time   CALCIUM 9.4 03/18/2018 1206   CALCIUM 9.0 12/17/2017 1303   ALKPHOS 101 03/18/2018 1206   ALKPHOS 85 12/17/2017 1303   AST 11 03/18/2018 1206   AST 12 12/17/2017 1303   ALT 11 03/18/2018 1206   ALT 15 12/17/2017 1303   BILITOT 0.5 03/18/2018 1206   BILITOT 0.41 12/17/2017 1303      Results for DELOS, KLICH (MRN [id number]) as of 03/23/2018 12:50  Ref. Range 02/09/2018 09:48 03/18/2018 15:04  Prostate Specific Ag, Serum Latest Ref Range: 0.0 - 4.0 ng/mL 3.7 4.5 (H)     Impression and Plan:  67 year old man with  1.  Advanced prostate cancer with disease to the liver and that is castration resistant.    He is currently on Zytiga with a reasonable response to therapy.  His PSA remains under control despite slight elevation since the last visit.  He went for a period of time without his medication and despite explaining his PSA results.  Overall, his disease continues to be under excellent control.  2.  Hepatic metastasis: Imaging studies showed stable  disease and December 2018.  He denies any signs or symptoms of progression at this time.  3.  IV access: Port-A-Cath remains in use without any recent complications.  4. Hypokalemia: Slightly low and will be replaced periodically while on Zytiga.  5. Prognosis: He has an incurable malignancy but has been palliated reasonably well at this time.  Aggressive therapy is warranted at this time.  6.  Mild hydronephrosis: His kidney function continues to be stable with a normal creatinine.  7.  Androgen depravation: He continues to have castrate level testosterone.  8. Follow-up: Will be in 8 weeks to follow his progress.  15  minutes was spent with the patient face-to-face today.  More than 50% of time was dedicated to patient counseling, education and answering questions regarding his future plan of  care.    Zola Button, MD 4/3/201912:52 PM

## 2018-03-24 ENCOUNTER — Inpatient Hospital Stay: Payer: PPO

## 2018-03-24 ENCOUNTER — Other Ambulatory Visit: Payer: Self-pay | Admitting: Medical Oncology

## 2018-03-24 DIAGNOSIS — C61 Malignant neoplasm of prostate: Secondary | ICD-10-CM

## 2018-03-24 MED ORDER — HEPARIN SOD (PORK) LOCK FLUSH 100 UNIT/ML IV SOLN
500.0000 [IU] | Freq: Once | INTRAVENOUS | Status: AC
  Filled 2018-03-24: qty 5

## 2018-03-24 MED ORDER — SODIUM CHLORIDE 0.9% FLUSH
10.0000 mL | Freq: Once | INTRAVENOUS | Status: AC
  Filled 2018-03-24: qty 10

## 2018-04-11 MED FILL — ABIRATERONE ACETATE 250 MG: 250 | 30 days supply | Qty: 120 | Fill #2

## 2018-04-11 MED FILL — predniSONE 5 MG TABS: 5 | 30 days supply | Qty: 60 | Fill #2

## 2018-04-20 DIAGNOSIS — I10 Essential (primary) hypertension: Secondary | ICD-10-CM | POA: Diagnosis not present

## 2018-04-20 DIAGNOSIS — C61 Malignant neoplasm of prostate: Secondary | ICD-10-CM | POA: Diagnosis not present

## 2018-04-20 DIAGNOSIS — H9193 Unspecified hearing loss, bilateral: Secondary | ICD-10-CM | POA: Diagnosis not present

## 2018-04-20 DIAGNOSIS — C799 Secondary malignant neoplasm of unspecified site: Secondary | ICD-10-CM | POA: Diagnosis not present

## 2018-05-06 MED FILL — ABIRATERONE ACETATE 250 MG: 250 | 30 days supply | Qty: 120 | Fill #3

## 2018-05-06 MED FILL — predniSONE 5 MG TABS: 5 | 30 days supply | Qty: 60 | Fill #3

## 2018-05-09 DIAGNOSIS — Z6826 Body mass index (BMI) 26.0-26.9, adult: Secondary | ICD-10-CM | POA: Diagnosis not present

## 2018-05-09 DIAGNOSIS — I10 Essential (primary) hypertension: Secondary | ICD-10-CM | POA: Diagnosis not present

## 2018-05-11 ENCOUNTER — Telehealth: Payer: Self-pay | Admitting: Pharmacy Technician

## 2018-05-11 NOTE — Telephone Encounter (Signed)
Oral Oncology Patient Advocate Encounter  Was successful in securing patient an $ 6500 grant from [employer] [place] (PAF) to provide copayment coverage for his Zytiga.  This will keep the out of pocket expense at $0.    I have left a message for the patient.    The billing information is as follows and has been shared with [place].   Member ID: [id number] Group ID: [id number] RxBin: [id number] Dates of Eligibility: 03/26/2018 through 05/10/2019  [name]. [name], [place] Patient [place] [phone] 05/11/2018 9:59 AM

## 2018-05-26 ENCOUNTER — Inpatient Hospital Stay: Payer: PPO | Attending: Oncology

## 2018-05-26 ENCOUNTER — Ambulatory Visit: Payer: PPO | Admitting: Internal Medicine

## 2018-05-26 ENCOUNTER — Telehealth: Payer: Self-pay | Admitting: Oncology

## 2018-05-26 ENCOUNTER — Inpatient Hospital Stay (HOSPITAL_BASED_OUTPATIENT_CLINIC_OR_DEPARTMENT_OTHER): Payer: PPO | Admitting: Oncology

## 2018-05-26 VITALS — BP 150/84 | HR 80 | Temp 98.6°F | Resp 18 | Ht 62.0 in | Wt 139.6 lb

## 2018-05-26 DIAGNOSIS — C787 Secondary malignant neoplasm of liver and intrahepatic bile duct: Secondary | ICD-10-CM | POA: Diagnosis not present

## 2018-05-26 DIAGNOSIS — I10 Essential (primary) hypertension: Secondary | ICD-10-CM | POA: Insufficient documentation

## 2018-05-26 DIAGNOSIS — E291 Testicular hypofunction: Secondary | ICD-10-CM | POA: Diagnosis not present

## 2018-05-26 DIAGNOSIS — N1330 Unspecified hydronephrosis: Secondary | ICD-10-CM | POA: Diagnosis not present

## 2018-05-26 DIAGNOSIS — C61 Malignant neoplasm of prostate: Secondary | ICD-10-CM | POA: Insufficient documentation

## 2018-05-26 LAB — CMP (CANCER CENTER ONLY)
ALK PHOS: 103 U/L (ref 40–150)
ALT: 11 U/L (ref 0–55)
AST: 13 U/L (ref 5–34)
Albumin: 3.6 g/dL (ref 3.5–5.0)
Anion gap: 10 (ref 3–11)
BUN: 15 mg/dL (ref 7–26)
CALCIUM: 9.5 mg/dL (ref 8.4–10.4)
CHLORIDE: 105 mmol/L (ref 98–109)
CO2: 27 mmol/L (ref 22–29)
Creatinine: 1.11 mg/dL (ref 0.70–1.30)
Glucose, Bld: 84 mg/dL (ref 70–140)
Potassium: 3.6 mmol/L (ref 3.5–5.1)
Sodium: 142 mmol/L (ref 136–145)
Total Bilirubin: 0.7 mg/dL (ref 0.2–1.2)
Total Protein: 7.3 g/dL (ref 6.4–8.3)

## 2018-05-26 LAB — CBC WITH DIFFERENTIAL (CANCER CENTER ONLY)
Basophils Absolute: 0 10*3/uL (ref 0.0–0.1)
Basophils Relative: 0 %
Eosinophils Absolute: 0.1 10*3/uL (ref 0.0–0.5)
Eosinophils Relative: 2 %
HCT: 35.3 % — ABNORMAL LOW (ref 38.4–49.9)
Hemoglobin: 11.7 g/dL — ABNORMAL LOW (ref 13.0–17.1)
LYMPHS ABS: 0.9 10*3/uL (ref 0.9–3.3)
LYMPHS PCT: 18 %
MCH: 27.5 pg (ref 27.2–33.4)
MCHC: 33.1 g/dL (ref 32.0–36.0)
MCV: 83.1 fL (ref 79.3–98.0)
MONOS PCT: 8 %
Monocytes Absolute: 0.4 10*3/uL (ref 0.1–0.9)
Neutro Abs: 3.7 10*3/uL (ref 1.5–6.5)
Neutrophils Relative %: 72 %
PLATELETS: 199 10*3/uL (ref 140–400)
RBC: 4.25 MIL/uL (ref 4.20–5.82)
RDW: 13.4 % (ref 11.0–14.6)
WBC: 5.1 10*3/uL (ref 4.0–10.3)

## 2018-05-26 NOTE — Telephone Encounter (Signed)
Scheduled appt per 6/6 los - gave patient AVS and calender per los.  

## 2018-05-26 NOTE — Addendum Note (Signed)
Addended by: Wyatt Portela on: 05/26/2018 10:06 AM   Modules accepted: Level of Service

## 2018-05-26 NOTE — Progress Notes (Signed)
Hematology and Oncology Follow Up Visit  [name] Johnston [id number] 1951/04/08 67 y.o. 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  He was diagnosed in 2014 with Gleason score 4+4 = 8 ,PSA of 40. Marland Kitchen   Prior Therapy:  He is started [place] on 06/27/2013. He subsequently received definitive radiation therapy for a planned dose of 75 gray completed in November 2014 under the care of Dr. Tammi Klippel.   He developed castration resistant disease with biopsy-proven liver metastasis in May 2016.  Docetaxel 75 mg/m given every 3 weeks with Neulasta support. He is status post 10 cycles of treatment concluded on 11/21/2015. He had an excellent PSA response with PSA dropping from 222 to 0.17.   PSA started to rise again with PSA up to 8.9 in August 2017.  He is status post orchiectomy completed in February 2019.  Current therapy: Zytiga 1000 mg with prednisone since September 2017.   Interim History:  [name] Johnston presents today for a follow-up.  He reports no major changes in his health since the last visit.  He has resumed his Zytiga without any major complications related to this medication.  He denies any nausea, excessive fatigue or tiredness.  His blood pressure has been noted to be elevated although he checks his blood pressure periodically at home and has been within normal range.  He was seen by his primary care physician and recommended antihypertensive his blood pressure persistently runs high.  He remains active and continues to attend activities of daily living.  He denies any bone pain or pathological fractures.   He does not report any blurry vision or syncope.  He denies any alteration in mental status, psychiatric issues of depression.  He does not report any fevers or chills.   His appetite and weight remain excellent.  He does not report any chest pain, palpitation,  orthopnea or leg edema.  He does not report any cough, wheezing or hemoptysis.  He does not report any frequency urgency and no hematuria.  He does not report any pathological fractures or any bone pain. He does not report any lymphadenopathy or petechiae.  He does not report any heat or cold intolerance.  He does not report any skin rashes or lesions.   Remainder of his review of systems is negative.  Medications: I have reviewed the patient's current medications.   Current Outpatient Medications  Medication Sig Dispense Refill  . abiraterone acetate (ZYTIGA) 250 MG tablet Take 4 tablets (1,000 mg total) by mouth daily. Take on an empty stomach 1 hour before or 2 hours after a meal 120 tablet 3  . albuterol (PROAIR HFA) 108 (90 Base) MCG/ACT inhaler Inhale 2 puffs into the lungs every 6 (six) hours as needed for wheezing or shortness of breath. 1 Inhaler 3  . oxyCODONE-acetaminophen (PERCOCET/ROXICET) 5-325 MG tablet Take 1 tablet by mouth every 6 (six) hours as needed for severe pain. 20 tablet 0  . predniSONE (DELTASONE) 5 MG tablet Take 1 tablet (5 mg total) by mouth 2 (two) times daily with a meal. 60 tablet 3  . traMADol (ULTRAM) 50 MG tablet Take 1 tablet (50 mg total) by mouth every 6 (six) hours as needed. (Patient taking differently: Take 50 mg by mouth every 6 (six) hours as needed for moderate pain. ) 12 tablet 0  . ZYTIGA 250 MG tablet TAKE 4 TABLETS (1,000 MG TOTAL) BY MOUTH DAILY.  TAKE ON AN EMPTY STOMACH 1 HOUR BEFORE OR 2 HOURS AFTER A MEAL 120 tablet 3   No current facility-administered medications for this visit.    Facility-Administered Medications Ordered in Other Visits  Medication Dose Route Frequency Provider Last Rate Last Dose  . heparin lock flush 100 unit/mL  500 Units Intracatheter Once [name] Portela, MD      . sodium chloride flush (NS) 0.9 % injection 10 mL  10 mL Intracatheter Once [name] Portela, MD         Allergies:  Allergies  Allergen Reactions  .  Aspirin Hives    Past Medical History, Surgical history, Social history, and Family History were reviewed and updated.    Physical Exam: Blood pressure (!) 150/84, pulse 80, temperature 98.6 F (37 C), temperature source Oral, resp. rate 18, height 5\' 2"  (1.575 m), weight 139 lb 9.6 oz (63.3 kg), SpO2 99 %.    ECOG: 1 General appearance: Well-appearing gentleman without distress. Head: Normocephalic without abnormalities. Oropharynx: No oral ulcers or thrush.  Mucous membranes are moist and pink. Eyes: Pupils are equal and round reactive to light. Lymph nodes: No lymphadenopathy noted in the cervical, supraclavicular, and axillary nodes.  Heart: Regular rate without any murmurs or gallops. Lung: Clear without any rhonchi, wheezes or dullness to percussion. Abdomin: Soft, nontender without any rebound or guarding.  No shifting dullness or ascites. Musculoskeletal: No joint deformity or effusion. Skin: No ecchymosis or petechiae.  No skin rashes or lesions. Neurological: No motor or sensory deficits.  Lab Results: Lab Results  Component Value Date   WBC 5.1 05/26/2018   HGB 11.7 (L) 05/26/2018   HCT 35.3 (L) 05/26/2018   MCV 83.1 05/26/2018   PLT 199 05/26/2018     Chemistry      Component Value Date/Time   NA 141 03/18/2018 1206   NA 142 12/17/2017 1303   K 3.1 (L) 03/18/2018 1206   K 3.2 (L) 12/17/2017 1303   CL 105 03/18/2018 1206   CO2 27 03/18/2018 1206   CO2 28 12/17/2017 1303   BUN 14 03/18/2018 1206   BUN 14.0 12/17/2017 1303   CREATININE 0.87 03/18/2018 1206   CREATININE 1.1 12/17/2017 1303      Component Value Date/Time   CALCIUM 9.4 03/18/2018 1206   CALCIUM 9.0 12/17/2017 1303   ALKPHOS 101 03/18/2018 1206   ALKPHOS 85 12/17/2017 1303   AST 11 03/18/2018 1206   AST 12 12/17/2017 1303   ALT 11 03/18/2018 1206   ALT 15 12/17/2017 1303   BILITOT 0.5 03/18/2018 1206   BILITOT 0.41 12/17/2017 1303       Results for [name], Johnston (MRN [id number]) as of  05/26/2018 09:38  Ref. Range 02/09/2018 09:48 03/18/2018 15:04  Prostate Specific Ag, Serum Latest Ref Range: 0.0 - 4.0 ng/mL 3.7 4.5 (H)     Impression and Plan:  67 year old man with:  1.  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  He is status post therapy outlined above currently on Zytiga with reasonable response to therapy.  His PSA continues to be under reasonable control and he is asymptomatic.  Risks and benefits of continuing this medication was reviewed today and is agreeable to continue.  We will repeat imaging studies before the next visit to update his staging.  2.  Hepatic metastasis: CT scan obtained December 2018 showed stable disease.  We will repeat imaging studies in  August 2019.  3.  IV access: Port-A-Cath will be flushed periodically without any complications.  4. Hypokalemia: His potassium repeated and was within normal range at this time.  5. Prognosis: He has incurable malignancy although his disease has been palliated adequately for an extended period of time.  Aggressive therapy is warranted given his excellent performance status.  6.  Mild hydronephrosis: His kidney function remains within normal range at this time.  7.  Androgen depravation: He is status post orchiectomy in February 2019.  8.  Hypertension: Could be related to [place].  I encouraged him to continue to monitor his blood pressure and record these readings.  Antihypertensive may be needed if he has persistent elevation between visits.  9. Follow-up: Will be in 8 weeks to follow his progress.  25  minutes was spent with the patient face-to-face today.  More than 50% of time was dedicated to patient counseling, education and arranging future plan of care.    Zola Button, MD 6/6/20199:38 AM

## 2018-05-27 LAB — PROSTATE-SPECIFIC AG, SERUM (LABCORP): PROSTATE SPECIFIC AG, SERUM: 6 ng/mL — AB (ref 0.0–4.0)

## 2018-06-06 ENCOUNTER — Other Ambulatory Visit: Payer: Self-pay | Admitting: Oncology

## 2018-06-06 DIAGNOSIS — C61 Malignant neoplasm of prostate: Secondary | ICD-10-CM

## 2018-06-06 MED FILL — ABIRATERONE ACETATE 250 MG: 250 | 30 days supply | Qty: 120 | Fill #2

## 2018-06-06 MED FILL — predniSONE 5 MG TABS: 5 | 30 days supply | Qty: 60 | Fill #0

## 2018-06-13 DIAGNOSIS — R9721 Rising PSA following treatment for malignant neoplasm of prostate: Secondary | ICD-10-CM | POA: Diagnosis not present

## 2018-06-13 DIAGNOSIS — N130 Hydronephrosis with ureteropelvic junction obstruction: Secondary | ICD-10-CM | POA: Diagnosis not present

## 2018-06-13 DIAGNOSIS — C61 Malignant neoplasm of prostate: Secondary | ICD-10-CM | POA: Diagnosis not present

## 2018-06-13 DIAGNOSIS — R310 Gross hematuria: Secondary | ICD-10-CM | POA: Diagnosis not present

## 2018-06-14 ENCOUNTER — Encounter (HOSPITAL_BASED_OUTPATIENT_CLINIC_OR_DEPARTMENT_OTHER): Payer: Self-pay | Admitting: *Deleted

## 2018-06-14 ENCOUNTER — Other Ambulatory Visit: Payer: Self-pay | Admitting: Urology

## 2018-06-15 ENCOUNTER — Encounter (HOSPITAL_BASED_OUTPATIENT_CLINIC_OR_DEPARTMENT_OTHER): Payer: Self-pay | Admitting: *Deleted

## 2018-06-15 ENCOUNTER — Other Ambulatory Visit: Payer: Self-pay

## 2018-06-15 NOTE — Progress Notes (Signed)
Spoke w/ pt's daughter, Dorthula Matas, via phone for pre-op interview.  Pt speaks montagnard rhadi,  Daughter will interpret dos for pt.  Daughter verbalized understanding for pt to be npo after mn w/ exception sips of water with am meds.  Arrive at [employer].  Current lab results (dated 05-26-2018), cxr, and ekg in chart and epic.

## 2018-06-20 NOTE — H&P (Signed)
CC: I have prostate cancer (treatment).  HPI: [name] Johnston is a 67 year-old male established patient who is here for prostate cancer which has been treated.  He is not participating in active surveillance. He has undergone Hormonal Therapy for treatment. He is not currently receiving hormonal manipulation. He did undergo chemotherapy for treatment of his prostate cancer. He received docetaxel for treatment of his prostate cancer. Patient denies mitoxantrone.   His PSA blood tests have not been low since his prostate cancer treatment was started. His PSA is rising.   He does not have to strain or bear down to start his urinary stream.   [name] Johnston returns today in f/u. 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 He remains on abriaterone as well but his PSA was up to 6 from 4.5 at his last check with Dr. Alen Blew on 05/26/18. He returns now with a 1 month complaint of gross hematuria. He can have some burning after a BM. He has no blood in the bowels. He does have constipation. He has had a prior TURP. He had microhematuria in 2/19 and has >60 RBC's/HPF today. He did have radation as part of his initial therapy.   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 He is almost 2 years out from 10 cycles of Taxotere. He is currently on Zytiga.     CC: AUA Questions Scoring.  HPI:     AUA Symptom Score: Less than 50% of the time he has the sensation of not emptying his bladder completely when finished urinating. Almost always he has to urinate again fewer than two hours after he has finished urinating. Almost always he has to start and stop again several times when he urinates. Almost always he finds it difficult to postpone urination. He never has a weak urinary stream. He never has to push or strain to begin urination. He has to get up to urinate 5 or more times from the time he goes to bed until the time he gets up in the  morning.   Calculated AUA Symptom Score: 22    ALLERGIES: Aspirin Low Dose TABS    MEDICATIONS: Percocet  Lupron Depot 30 mg (4 month) syringe kit 30 mg IM Q4M Administered by: [name]  Prednisone  Zytiga     GU PSH: Cysto Dilate Stricture (M or F) - 01/25/2018 Cystoscopy Insert Stent, Left - 01/25/2018 Cystoscopy TURP - 2014 Hydrocele repair, Left - 01/25/2018 Remove Prostate Regrowth - 2015 Simple orchiectomy, Bilateral - 01/25/2018    NON-GU PSH: None   GU PMH: Other hydrocele - 02/09/2018, He has a large symptomatic left hydrocele with a mass in the testicle that could be a primary testicular neoplasm or more likely a prostate met. I am going to get him set up for a left hydrocelectomy and bilateral orchiectomy which will eliminate the need for lupron. I have reviewed the risks of bleeding, infection, recurrent hydrocele, thrombotic events and anesthetic complications. , - 01/21/2018 Prostate Cancer - 02/09/2018, (Worsening), He has CRCP with progression on Zytiga and Lupron with prior docetaxal. He now has left ureteral obstruction , - 01/21/2018, - 09/20/2017, - 05/05/2017, - 12/28/2016, - 08/28/2016, Prostate cancer, - 2017, 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, - 2016, Prostate cancer, - 2016 Chronic kidney disease stage 3 (GFR 30-60) - 01/21/2018 Hydronephrosis, He has malignant left ureteral obstruction with some pain and progressive AKI. I will do cystoscopy with stenting at  the time of the hydrocelectomy. I reviewed this additional risks including inability to place a stent. I am not sure I would recommend a perc if I can't get a stent up with his progressive CRCP. 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 He had a cysto and TURP in June 2014 which revealed Gleason's 4+4 equals 8 adenocarcinoma involving approximate 70% of the resected prostate tissue. He had radiation and has been on hormonal therapy since July 2014. Testosterone has confirmed to be at castrate levels. PSA was 0.33 in Feb 2015.       NON-GU PMH: Encounter for general adult medical examination without abnormal findings, Encounter for preventive health examination - 2017 Asthma, Asthma - 2014    FAMILY HISTORY: Family Health Status - Father alive at age 30 - Father Family Health Status - Mother's Age - Mother Family Health Status Number - Runs In Family No Significant Family History - Runs In Family   SOCIAL HISTORY: Marital Status: Married Ethnicity: Not Hispanic Or Latino; Race: Unknown Current Smoking Status: Patient has never smoked.  Has never drank.  Does not drink caffeine.    REVIEW OF SYSTEMS:    GU Review Male:   Patient reports burning/ pain with urination. Patient denies frequent urination, hard to postpone urination, get up at night to urinate, leakage of urine, stream starts and stops, trouble starting your stream, have to strain to urinate , erection problems, and penile pain.  Gastrointestinal (Upper):   Patient denies nausea, vomiting, and indigestion/ heartburn.  Gastrointestinal (Lower):   Patient denies diarrhea and constipation.  Constitutional:   Patient denies fever, night sweats, weight loss, and fatigue.  Skin:   Patient denies skin rash/ lesion and itching.  Eyes:   Patient denies blurred vision and double vision.  Ears/ Nose/ Throat:   Patient denies sore throat and sinus problems.  Hematologic/Lymphatic:   Patient denies swollen glands and easy bruising.   Cardiovascular:   Patient reports chest pains. Patient denies leg swelling.  Respiratory:   Patient denies cough and shortness of breath.  Endocrine:   Patient denies excessive thirst.  Musculoskeletal:   Patient reports joint pain. Patient denies back pain.  Neurological:   Patient denies headaches and dizziness.  Psychologic:   Patient denies depression and anxiety.   Notes: Blood in urine     VITAL SIGNS:      06/13/2018 01:08 PM  Weight 139 lb / 63.05 kg  Height 62 in / 157.48 cm  BP 144/87 mmHg  Heart Rate 80 /min  Temperature 98.0 F / 36.6 C  BMI 25.4 kg/m   MULTI-SYSTEM PHYSICAL EXAMINATION:    Constitutional: Well-nourished. No physical deformities. Normally developed. Good grooming.  Respiratory: No labored breathing, no use of accessory muscles. Normal breath sounds.  Cardiovascular: Regular rate and rhythm. No murmur, no gallop. Normal temperature, no swelling, no varicosities.      PAST DATA REVIEWED:  Source Of History:  Patient  Lab Test Review:   PSA  Records Review:   AUA  Symptom Score, Previous Doctor Records  Urine Test Review:   Urinalysis   03/15/15 10/30/14 06/19/14 02/13/14 11/07/13 08/11/13 05/09/13  PSA  Total PSA 79.34  11.08  0.44  0.33  0.76  2.97  37.66     03/14/15 10/29/14 02/13/14 11/07/13 08/11/13  Hormones  Testosterone, Total _0 33  30    Notes:                     I have reviewed his recent notes and labs from Dr. Alen Blew.    PROCEDURES:         KUB - [id number]  A single view of the abdomen is obtained.               Urinalysis w/Scope Dipstick Dipstick Cont'd Micro  Color: Yellow Bilirubin: Neg WBC/hpf: 0 - 5/hpf  Appearance: Cloudy Ketones: Neg RBC/hpf: >60/hpf  Specific Gravity: 1.020 Blood: 3+ Bacteria: NS (Not Seen)  pH: 6.0 Protein: Trace Cystals: NS (Not Seen)  Glucose: Neg Urobilinogen: 0.2 Casts: NS (Not Seen)    Nitrites: Neg Trichomonas: Not Present    Leukocyte Esterase: Trace Mucous: Not Present       Epithelial Cells: NS (Not Seen)      Yeast: NS (Not Seen)      Sperm: Not Present    ASSESSMENT:      ICD-10 Details  1 GU:   Gross hematuria - R31.0   2   Hydronephrosis - N13.0 He has a left ureteral stent that is the likely cause for the hematuria. It has been in 4 months and needs to be exchanged. I have reviewed the risks associated with the procedure and will schedule it in the near future.   3   Prostate Cancer - C61 Worsening - His PSA is rising on ADT and Abiraterone.   4   Rising PSA after prostate cancer treatment - R97.21    PLAN:           Orders X-Rays: KUB          Schedule Return Visit/Planned Activity: Next Available Appointment - Schedule Surgery             Note: cystoscopy with left stent exchange.           Document Letter(s):  Created for Patient: Clinical Summary

## 2018-06-21 ENCOUNTER — Encounter (HOSPITAL_BASED_OUTPATIENT_CLINIC_OR_DEPARTMENT_OTHER): Payer: Self-pay | Admitting: Anesthesiology

## 2018-06-21 ENCOUNTER — Encounter (HOSPITAL_BASED_OUTPATIENT_CLINIC_OR_DEPARTMENT_OTHER)
Admission: RE | Disposition: A | Discharge status: Home / Self Care | Payer: Self-pay | Source: Ambulatory Visit | Attending: Urology

## 2018-06-21 ENCOUNTER — Ambulatory Visit (HOSPITAL_BASED_OUTPATIENT_CLINIC_OR_DEPARTMENT_OTHER): Payer: PPO | Admitting: Anesthesiology

## 2018-06-21 ENCOUNTER — Ambulatory Visit (HOSPITAL_BASED_OUTPATIENT_CLINIC_OR_DEPARTMENT_OTHER)
Admission: RE | Admit: 2018-06-21 | Discharge: 2018-06-21 | Disposition: A | Discharge status: Home / Self Care | Payer: PPO | Source: Ambulatory Visit | Attending: Urology | Admitting: Urology

## 2018-06-21 ENCOUNTER — Other Ambulatory Visit: Payer: Self-pay

## 2018-06-21 DIAGNOSIS — Z79899 Other long term (current) drug therapy: Secondary | ICD-10-CM | POA: Diagnosis not present

## 2018-06-21 DIAGNOSIS — N183 Chronic kidney disease, stage 3 (moderate): Secondary | ICD-10-CM | POA: Diagnosis not present

## 2018-06-21 DIAGNOSIS — K219 Gastro-esophageal reflux disease without esophagitis: Secondary | ICD-10-CM | POA: Insufficient documentation

## 2018-06-21 DIAGNOSIS — J45909 Unspecified asthma, uncomplicated: Secondary | ICD-10-CM | POA: Diagnosis not present

## 2018-06-21 DIAGNOSIS — N135 Crossing vessel and stricture of ureter without hydronephrosis: Secondary | ICD-10-CM | POA: Insufficient documentation

## 2018-06-21 DIAGNOSIS — N130 Hydronephrosis with ureteropelvic junction obstruction: Secondary | ICD-10-CM | POA: Diagnosis not present

## 2018-06-21 DIAGNOSIS — I129 Hypertensive chronic kidney disease with stage 1 through stage 4 chronic kidney disease, or unspecified chronic kidney disease: Secondary | ICD-10-CM | POA: Insufficient documentation

## 2018-06-21 DIAGNOSIS — I10 Essential (primary) hypertension: Secondary | ICD-10-CM | POA: Diagnosis not present

## 2018-06-21 DIAGNOSIS — Z466 Encounter for fitting and adjustment of urinary device: Secondary | ICD-10-CM | POA: Diagnosis not present

## 2018-06-21 DIAGNOSIS — C61 Malignant neoplasm of prostate: Secondary | ICD-10-CM | POA: Diagnosis not present

## 2018-06-21 HISTORY — DX: Hematuria, unspecified: R31.9

## 2018-06-21 HISTORY — DX: Nocturia: R35.1

## 2018-06-21 HISTORY — DX: Personal history of irradiation: Z92.3

## 2018-06-21 HISTORY — PX: CYSTOSCOPY W/ URETERAL STENT PLACEMENT: SHX1429

## 2018-06-21 HISTORY — DX: Secondary malignant neoplasm of liver and intrahepatic bile duct: C61

## 2018-06-21 HISTORY — DX: Secondary malignant neoplasm of liver and intrahepatic bile duct: C78.7

## 2018-06-21 HISTORY — DX: Presence of external hearing-aid: Z97.4

## 2018-06-21 HISTORY — DX: Essential (primary) hypertension: I10

## 2018-06-21 HISTORY — DX: Cyst of kidney, acquired: N28.1

## 2018-06-21 HISTORY — DX: Unspecified asthma, uncomplicated: J45.909

## 2018-06-21 HISTORY — DX: Presence of spectacles and contact lenses: Z97.3

## 2018-06-21 SURGERY — CYSTOSCOPY, FLEXIBLE, WITH STENT REPLACEMENT
Anesthesia: General | Site: Renal | Laterality: Left

## 2018-06-21 MED ORDER — DEXAMETHASONE SODIUM PHOSPHATE 4 MG/ML IJ SOLN
INTRAMUSCULAR | Status: DC | PRN
  Administered 2018-06-21: 10 mg via INTRAVENOUS

## 2018-06-21 MED ORDER — KETOROLAC TROMETHAMINE 30 MG/ML IJ SOLN
INTRAMUSCULAR | Status: AC
  Filled 2018-06-21: qty 1

## 2018-06-21 MED ORDER — SODIUM CHLORIDE 0.9% FLUSH
3.0000 mL | Freq: Two times a day (BID) | INTRAVENOUS | Status: DC
  Filled 2018-06-21: qty 3

## 2018-06-21 MED ORDER — PROPOFOL 10 MG/ML IV BOLUS
INTRAVENOUS | Status: DC | PRN
  Administered 2018-06-21: 150 mg via INTRAVENOUS

## 2018-06-21 MED ORDER — KETOROLAC TROMETHAMINE 30 MG/ML IJ SOLN
INTRAMUSCULAR | Status: DC | PRN
  Administered 2018-06-21: 30 mg via INTRAVENOUS

## 2018-06-21 MED ORDER — ACETAMINOPHEN 650 MG RE SUPP
650.0000 mg | RECTAL | Status: DC | PRN
  Filled 2018-06-21: qty 1

## 2018-06-21 MED ORDER — CEFAZOLIN SODIUM-DEXTROSE 2-4 GM/100ML-% IV SOLN
2.0000 g | INTRAVENOUS | Status: AC
  Administered 2018-06-21: 2 g via INTRAVENOUS
  Filled 2018-06-21: qty 100

## 2018-06-21 MED ORDER — LIDOCAINE 2% (20 MG/ML) 5 ML SYRINGE
INTRAMUSCULAR | Status: AC
  Filled 2018-06-21: qty 5

## 2018-06-21 MED ORDER — MORPHINE SULFATE (PF) 2 MG/ML IV SOLN
1.0000 mg | INTRAVENOUS | Status: DC | PRN
  Filled 2018-06-21: qty 0.5

## 2018-06-21 MED ORDER — DEXAMETHASONE SODIUM PHOSPHATE 10 MG/ML IJ SOLN
INTRAMUSCULAR | Status: AC
  Filled 2018-06-21: qty 1

## 2018-06-21 MED ORDER — HYDROCODONE-ACETAMINOPHEN 7.5-325 MG PO TABS
1.0000 | ORAL_TABLET | Freq: Once | ORAL | Status: DC | PRN
  Filled 2018-06-21: qty 1

## 2018-06-21 MED ORDER — OXYCODONE HCL 5 MG PO TABS
5.0000 mg | ORAL_TABLET | ORAL | Status: DC | PRN
  Filled 2018-06-21: qty 2

## 2018-06-21 MED ORDER — FENTANYL CITRATE (PF) 100 MCG/2ML IJ SOLN
INTRAMUSCULAR | Status: DC | PRN
  Administered 2018-06-21: 25 ug via INTRAVENOUS
  Administered 2018-06-21: 50 ug via INTRAVENOUS
  Administered 2018-06-21: 25 ug via INTRAVENOUS

## 2018-06-21 MED ORDER — LACTATED RINGERS IV SOLN
INTRAVENOUS | Status: DC
  Administered 2018-06-21 (×2): via INTRAVENOUS
  Filled 2018-06-21: qty 1000

## 2018-06-21 MED ORDER — CEFAZOLIN SODIUM-DEXTROSE 2-4 GM/100ML-% IV SOLN
INTRAVENOUS | Status: AC
  Filled 2018-06-21: qty 100

## 2018-06-21 MED ORDER — MEPERIDINE HCL 25 MG/ML IJ SOLN
6.2500 mg | INTRAMUSCULAR | Status: DC | PRN
  Filled 2018-06-21: qty 1

## 2018-06-21 MED ORDER — FENTANYL CITRATE (PF) 100 MCG/2ML IJ SOLN
INTRAMUSCULAR | Status: AC
  Filled 2018-06-21: qty 2

## 2018-06-21 MED ORDER — FENTANYL CITRATE (PF) 100 MCG/2ML IJ SOLN
25.0000 ug | INTRAMUSCULAR | Status: DC | PRN
  Filled 2018-06-21: qty 1

## 2018-06-21 MED ORDER — SODIUM CHLORIDE 0.9 % IV SOLN
250.0000 mL | INTRAVENOUS | Status: DC | PRN
  Filled 2018-06-21: qty 250

## 2018-06-21 MED ORDER — ONDANSETRON HCL 4 MG/2ML IJ SOLN
INTRAMUSCULAR | Status: DC | PRN
  Administered 2018-06-21: 4 mg via INTRAVENOUS

## 2018-06-21 MED ORDER — ACETAMINOPHEN 325 MG PO TABS
650.0000 mg | ORAL_TABLET | ORAL | Status: DC | PRN
  Filled 2018-06-21: qty 2

## 2018-06-21 MED ORDER — SODIUM CHLORIDE 0.9% FLUSH
3.0000 mL | INTRAVENOUS | Status: DC | PRN
  Filled 2018-06-21: qty 3

## 2018-06-21 MED ORDER — LIDOCAINE HCL (CARDIAC) PF 100 MG/5ML IV SOSY
PREFILLED_SYRINGE | INTRAVENOUS | Status: DC | PRN
  Administered 2018-06-21: 80 mg via INTRAVENOUS

## 2018-06-21 MED ORDER — PROPOFOL 10 MG/ML IV BOLUS
INTRAVENOUS | Status: AC
  Filled 2018-06-21: qty 20

## 2018-06-21 MED ORDER — ONDANSETRON HCL 4 MG/2ML IJ SOLN
4.0000 mg | Freq: Once | INTRAMUSCULAR | Status: DC | PRN
  Filled 2018-06-21: qty 2

## 2018-06-21 MED ORDER — STERILE WATER FOR IRRIGATION IR SOLN
Status: DC | PRN
  Administered 2018-06-21: 3000 mL

## 2018-06-21 MED ORDER — ONDANSETRON HCL 4 MG/2ML IJ SOLN
INTRAMUSCULAR | Status: AC
  Filled 2018-06-21: qty 2

## 2018-06-21 SURGICAL SUPPLY — 27 items
BAG DRAIN URO-CYSTO SKYTR STRL (DRAIN) ×3 IMPLANT
BASKET STONE 1.7 NGAGE (UROLOGICAL SUPPLIES) IMPLANT
BASKET ZERO TIP NITINOL 2.4FR (BASKET) IMPLANT
CATH URET 5FR 28IN CONE TIP (BALLOONS)
CATH URET 5FR 28IN OPEN ENDED (CATHETERS) IMPLANT
CATH URET 5FR 70CM CONE TIP (BALLOONS) IMPLANT
CLOTH BEACON ORANGE TIMEOUT ST (SAFETY) ×3 IMPLANT
ELECT REM PT RETURN 9FT ADLT (ELECTROSURGICAL)
ELECTRODE REM PT RTRN 9FT ADLT (ELECTROSURGICAL) IMPLANT
FIBER LASER FLEXIVA 365 (UROLOGICAL SUPPLIES) IMPLANT
FIBER LASER TRAC TIP (UROLOGICAL SUPPLIES) IMPLANT
GLOVE SURG SS PI 8.0 STRL IVOR (GLOVE) ×3 IMPLANT
GOWN STRL REUS W/TWL XL LVL3 (GOWN DISPOSABLE) ×6 IMPLANT
GUIDEWIRE 0.038 PTFE COATED (WIRE) IMPLANT
GUIDEWIRE ANG ZIPWIRE 038X150 (WIRE) IMPLANT
GUIDEWIRE STR DUAL SENSOR (WIRE) ×3 IMPLANT
INFUSOR MANOMETER BAG 3000ML (MISCELLANEOUS) IMPLANT
IV NS IRRIG 3000ML ARTHROMATIC (IV SOLUTION) IMPLANT
KIT TURNOVER CYSTO (KITS) ×3 IMPLANT
MANIFOLD NEPTUNE II (INSTRUMENTS) ×3 IMPLANT
NS IRRIG 500ML POUR BTL (IV SOLUTION) ×3 IMPLANT
PACK CYSTO (CUSTOM PROCEDURE TRAY) ×3 IMPLANT
STENT URET 6FRX24 CONTOUR (STENTS) ×3 IMPLANT
TUBE CONNECTING 12'X1/4 (SUCTIONS)
TUBE CONNECTING 12X1/4 (SUCTIONS) IMPLANT
TUBING UROLOGY SET (TUBING) IMPLANT
WATER STERILE IRR 3000ML UROMA (IV SOLUTION) ×3 IMPLANT

## 2018-06-21 NOTE — Interval H&P Note (Signed)
History and Physical Interval Note:  06/21/2018 8:59 AM  [name] Johnston  has presented today for surgery, with the diagnosis of LEFT URETERAL OBSTRUCTION WITH RETAINED STENT  The various methods of treatment have been discussed with the patient and family. After consideration of risks, benefits and other options for treatment, the patient has consented to  Procedure(s): CYSTOSCOPY WITH LEFT STENT EXCHANGE (Left) as a surgical intervention .  The patient's history has been reviewed, patient examined, no change in status, stable for surgery.  I have reviewed the patient's chart and labs.  Questions were answered to the patient's satisfaction.     [name]

## 2018-06-21 NOTE — Op Note (Signed)
Procedure: Cystoscopy with removal and replacement of left double-J stent.  Preop diagnosis: Left ureteral obstruction from metastatic prostate cancer.  Postoperative diagnosis: Same.  Surgeon: Dr. [name].  Anesthesia: General.  Specimen: None.  Drain: 6 [place] by 24 cm contour double-J stent.  Blood loss: None.  Complications: None.  Indications: [name] Johnston is a 67 year old [place] male with castrate resistant metastatic prostate cancer left ureteral obstruction who is due for a stent exchange.  Procedure: He was taken operating room where he was given antibiotics.  A general anesthetic was induced.  He was placed in lithotomy position was fitted with PAS hose.  His perineum and genitalia were prepped with Betadine solution he was draped in usual sterile fashion.  Cystoscopy was performed using the 23 [place] scope and 30 degree lens.  The meatus and urethra are tight but did admit the scope.  He had mild pain urethral stricture disease.  The prostatic urethra was widely patent from prior resection with some possible tumor regrowth at the right apex.  The bladder was smooth without tumors or stones.  There was some increased vascularity at the base.  The right ureteral orifice was unremarkable.  The left ureteral orifice had a stent present but minimal edema or erythema.  The stent was grasped with a grasping forceps and pulled the urethral meatus.  A sensor guidewire was then advanced through the stent to the kidney and the stent was removed.  There was some initial resistance to passage of the wire but eventually it was successful.  The cystoscope was then reinserted over the wire and a new 6 [place] by 24 cm contour double-J stent without tether was passed to the kidney under fluoroscopic guidance.  Wire was removed leaving a good coil in the kidney and a good coil in the bladder.  The bladder was drained and the cystoscope was removed.  He was taken down from lithotomy position, his  anesthetic was reversed and he was moved to recovery in stable condition.  There were no complications.

## 2018-06-21 NOTE — Anesthesia Preprocedure Evaluation (Signed)
Anesthesia Evaluation  Patient identified by MRN, date of birth, ID band Patient awake    Reviewed: Allergy & Precautions, NPO status , Patient's Chart, lab work & pertinent test results  Airway Mallampati: II  TM Distance: >3 FB Neck ROM: Full    Dental no notable dental hx. (+) Teeth Intact   Pulmonary asthma ,    Pulmonary exam normal breath sounds clear to auscultation       Cardiovascular hypertension, Normal cardiovascular exam Rhythm:Regular Rate:Normal     Neuro/Psych Hx/o Bilateral subdural hematomas S/P Burr holes and drainage negative psych ROS   GI/Hepatic Neg liver ROS, GERD  ,  Endo/Other  negative endocrine ROS  Renal/GU Renal diseaseLeft ureteral obstruction with indwelling ureteral stent   Hx/o prostate Ca Bladder Ca    Musculoskeletal negative musculoskeletal ROS (+)   Abdominal   Peds  Hematology  (+) Blood dyscrasia, anemia ,   Anesthesia Other Findings   Reproductive/Obstetrics                             Anesthesia Physical Anesthesia Plan  ASA: III  Anesthesia Plan: General   Post-op Pain Management:    Induction: Intravenous  PONV Risk Score and Plan: 4 or greater and Ondansetron, Dexamethasone and Treatment may vary due to age or medical condition  Airway Management Planned: LMA  Additional Equipment:   Intra-op Plan:   Post-operative Plan: Extubation in OR  Informed Consent: I have reviewed the patients History and Physical, chart, labs and discussed the procedure including the risks, benefits and alternatives for the proposed anesthesia with the patient or authorized representative who has indicated his/her understanding and acceptance.   Dental advisory given  Plan Discussed with: CRNA and Surgeon  Anesthesia Plan Comments:         Anesthesia Quick Evaluation

## 2018-06-21 NOTE — Discharge Instructions (Signed)
Ureteral Stent Implantation, Care After Refer to this sheet in the next few weeks. These instructions provide you with information about caring for yourself after your procedure. Your health care provider may also give you more specific instructions. Your treatment has been planned according to current medical practices, but problems sometimes occur. Call your health care provider if you have any problems or questions after your procedure. What can I expect after the procedure? After the procedure, it is common to have:  Nausea.  Mild pain when you urinate. You may feel this pain in your lower back or lower abdomen. Pain should stop within a few minutes after you urinate. This may last for up to 1 week.  A small amount of blood in your urine for several days.  Follow these instructions at home:  Medicines  Take over-the-counter and prescription medicines only as told by your health care provider.  If you were prescribed an antibiotic medicine, take it as told by your health care provider. Do not stop taking the antibiotic even if you start to feel better.  Do not drive for 24 hours if you received a sedative.  Do not drive or operate heavy machinery while taking prescription pain medicines. Activity  Return to your normal activities as told by your health care provider. Ask your health care provider what activities are safe for you.  Do not lift anything that is heavier than 10 lb (4.5 kg). Follow this limit for 1 week after your procedure, or for as long as told by your health care provider. General instructions  Watch for any blood in your urine. Call your health care provider if the amount of blood in your urine increases.  If you have a catheter: ? Follow instructions from your health care provider about taking care of your catheter and collection bag. ? Do not take baths, swim, or use a hot tub until your health care provider approves.  Drink enough fluid to keep your urine  clear or pale yellow.  Keep all follow-up visits as told by your health care provider. This is important. Contact a health care provider if:  You have pain that gets worse or does not get better with medicine, especially pain when you urinate.  You have difficulty urinating.  You feel nauseous or you vomit repeatedly during a period of more than 2 days after the procedure. Get help right away if:  Your urine is dark red or has blood clots in it.  You are leaking urine (have incontinence).  The end of the stent comes out of your urethra.  You cannot urinate.  You have sudden, sharp, or severe pain in your abdomen or lower back.  You have a fever. This information is not intended to replace advice given to you by your health care provider. Make sure you discuss any questions you have with your health care provider. Document Released: 08/09/2013 Document Revised: 05/14/2016 Document Reviewed: 06/21/2015 Elsevier Interactive Patient Education  2018 [facility] Care Instructions  Activity: Get plenty of rest for the remainder of the day. A responsible individual must stay with you for 24 hours following the procedure.  For the next 24 hours, DO NOT: -Drive a car -Paediatric nurse -Drink alcoholic beverages -Take any medication unless instructed by your physician -Make any legal decisions or sign important papers.  Meals: Start with liquid foods such as gelatin or soup. Progress to regular foods as tolerated. Avoid greasy, spicy, heavy foods. If  nausea and/or vomiting occur, drink only clear liquids until the nausea and/or vomiting subsides. Call your physician if vomiting continues.  Special Instructions/Symptoms: Your throat may feel dry or sore from the anesthesia or the breathing tube placed in your throat during surgery. If this causes discomfort, gargle with warm salt water. The discomfort should disappear within 24 hours.  If you had a  scopolamine patch placed behind your ear for the management of post- operative nausea and/or vomiting:  1. The medication in the patch is effective for 72 hours, after which it should be removed.  Wrap patch in a tissue and discard in the trash. Wash hands thoroughly with soap and water. 2. You may remove the patch earlier than 72 hours if you experience unpleasant side effects which may include dry mouth, dizziness or visual disturbances. 3. Avoid touching the patch. Wash your hands with soap and water after contact with the patch.    May take ibuprofen a f ter 5 pm today if needed

## 2018-06-21 NOTE — Anesthesia Postprocedure Evaluation (Signed)
Anesthesia Post Note  Patient: [name] Johnston  Procedure(s) Performed: CYSTOSCOPY WITH LEFT STENT EXCHANGE (Left Renal)     Patient location during evaluation: PACU Anesthesia Type: General Level of consciousness: sedated Pain management: pain level controlled Vital Signs Assessment: post-procedure vital signs reviewed and stable Respiratory status: spontaneous breathing and respiratory function stable Cardiovascular status: stable Postop Assessment: no apparent nausea or vomiting Anesthetic complications: no    Last Vitals:  Vitals:   06/21/18 1115 06/21/18 1130  BP: (!) 156/85 (!) 145/84  Pulse: 66 66  Resp: (!) 9 14  Temp:    SpO2: 100% 99%    Last Pain:  Vitals:   06/21/18 1145  TempSrc:   PainSc: 0-No pain                 Rane Dumm DANIEL

## 2018-06-21 NOTE — Anesthesia Procedure Notes (Signed)
Procedure Name: LMA Insertion Date/Time: 06/21/2018 10:40 AM Performed by: Justice Rocher, CRNA Pre-anesthesia Checklist: Patient identified, Emergency Drugs available, Suction available and Patient being monitored Patient Re-evaluated:Patient Re-evaluated prior to induction Oxygen Delivery Method: Circle system utilized Preoxygenation: Pre-oxygenation with 100% oxygen Induction Type: IV induction Ventilation: Mask ventilation without difficulty LMA: LMA inserted LMA Size: 4.0 Number of attempts: 1 Airway Equipment and Method: Bite block Placement Confirmation: positive ETCO2 and breath sounds checked- equal and bilateral Tube secured with: Tape Dental Injury: Teeth and Oropharynx as per pre-operative assessment

## 2018-06-21 NOTE — Transfer of Care (Signed)
Immediate Anesthesia Transfer of Care Note  Patient: [name] Johnston  Procedure(s) Performed: Procedure(s) (LRB): CYSTOSCOPY WITH LEFT STENT EXCHANGE (Left)  Patient Location: PACU  Anesthesia Type: General  Level of Consciousness: awake, sedated, patient cooperative and responds to stimulation  Airway & Oxygen Therapy: Patient Spontanous Breathing and Patient connected to [place] O2  Post-op Assessment: Report given to PACU RN, Post -op Vital signs reviewed and stable and Patient moving all extremities  Post vital signs: Reviewed and stable  Complications: No apparent anesthesia complications

## 2018-06-27 ENCOUNTER — Encounter (HOSPITAL_BASED_OUTPATIENT_CLINIC_OR_DEPARTMENT_OTHER): Payer: Self-pay | Admitting: Urology

## 2018-07-06 MED FILL — ABIRATERONE ACETATE 250 MG: 250 | 30 days supply | Qty: 120 | Fill #3

## 2018-07-06 MED FILL — predniSONE 5 MG TABS: 5 | 30 days supply | Qty: 60 | Fill #1

## 2018-07-11 DIAGNOSIS — R3911 Hesitancy of micturition: Secondary | ICD-10-CM | POA: Diagnosis not present

## 2018-07-11 DIAGNOSIS — R310 Gross hematuria: Secondary | ICD-10-CM | POA: Diagnosis not present

## 2018-07-11 MED FILL — TAMSULOSIN HCL 0.4 MG CAP: 0.4 | 30 days supply | Qty: 30 | Fill #0

## 2018-07-13 MED FILL — CEPHALEXIN 500 MG CAPSULE: 500 | 7 days supply | Qty: 28 | Fill #0

## 2018-07-15 ENCOUNTER — Inpatient Hospital Stay: Payer: PPO

## 2018-07-15 ENCOUNTER — Inpatient Hospital Stay: Payer: PPO | Attending: Oncology

## 2018-07-15 DIAGNOSIS — C787 Secondary malignant neoplasm of liver and intrahepatic bile duct: Secondary | ICD-10-CM | POA: Insufficient documentation

## 2018-07-15 DIAGNOSIS — Z95828 Presence of other vascular implants and grafts: Secondary | ICD-10-CM

## 2018-07-15 DIAGNOSIS — C61 Malignant neoplasm of prostate: Secondary | ICD-10-CM | POA: Insufficient documentation

## 2018-07-15 LAB — CMP (CANCER CENTER ONLY)
ALBUMIN: 3.6 g/dL (ref 3.5–5.0)
ALT: 14 U/L (ref 0–44)
AST: 14 U/L — ABNORMAL LOW (ref 15–41)
Alkaline Phosphatase: 95 U/L (ref 38–126)
Anion gap: 11 (ref 5–15)
BUN: 19 mg/dL (ref 8–23)
CHLORIDE: 106 mmol/L (ref 98–111)
CO2: 26 mmol/L (ref 22–32)
Calcium: 9.3 mg/dL (ref 8.9–10.3)
Creatinine: 0.95 mg/dL (ref 0.61–1.24)
GFR, Est AFR Am: >60 mL/min (ref >60)
GFR, Estimated: >60 mL/min (ref >60)
Glucose, Bld: 115 mg/dL — ABNORMAL HIGH (ref 70–99)
POTASSIUM: 3.6 mmol/L (ref 3.5–5.1)
SODIUM: 143 mmol/L (ref 135–145)
TOTAL PROTEIN: 7.3 g/dL (ref 6.5–8.1)
Total Bilirubin: 0.5 mg/dL (ref 0.3–1.2)

## 2018-07-15 LAB — CBC WITH DIFFERENTIAL (CANCER CENTER ONLY)
Basophils Absolute: 0 10*3/uL (ref 0.0–0.1)
Basophils Relative: 1 %
EOS PCT: 1 %
Eosinophils Absolute: 0.1 10*3/uL (ref 0.0–0.5)
HCT: 34.7 % — ABNORMAL LOW (ref 38.4–49.9)
Hemoglobin: 11.5 g/dL — ABNORMAL LOW (ref 13.0–17.1)
LYMPHS ABS: 1 10*3/uL (ref 0.9–3.3)
LYMPHS PCT: 15 %
MCH: 27.3 pg (ref 27.2–33.4)
MCHC: 33.2 g/dL (ref 32.0–36.0)
MCV: 82.4 fL (ref 79.3–98.0)
MONO ABS: 0.4 10*3/uL (ref 0.1–0.9)
Monocytes Relative: 7 %
Neutro Abs: 5.1 10*3/uL (ref 1.5–6.5)
Neutrophils Relative %: 76 %
PLATELETS: 209 10*3/uL (ref 140–400)
RBC: 4.2 MIL/uL (ref 4.20–5.82)
RDW: 13.8 % (ref 11.0–14.6)
WBC Count: 6.7 10*3/uL (ref 4.0–10.3)

## 2018-07-15 MED ORDER — HEPARIN SOD (PORK) LOCK FLUSH 100 UNIT/ML IV SOLN
500.0000 [IU] | Freq: Once | INTRAVENOUS | Status: AC
  Administered 2018-07-15: 500 [IU]
  Filled 2018-07-15: qty 5

## 2018-07-15 MED ORDER — SODIUM CHLORIDE 0.9% FLUSH
10.0000 mL | Freq: Once | INTRAVENOUS | Status: AC
  Administered 2018-07-15: 10 mL
  Filled 2018-07-15: qty 10

## 2018-07-15 NOTE — Patient Instructions (Signed)

## 2018-07-16 LAB — PROSTATE-SPECIFIC AG, SERUM (LABCORP): Prostate Specific Ag, Serum: 12.5 ng/mL — ABNORMAL HIGH (ref 0.0–4.0)

## 2018-07-18 ENCOUNTER — Encounter (HOSPITAL_COMMUNITY)
Admission: RE | Admit: 2018-07-18 | Discharge: 2018-07-18 | Disposition: A | Discharge status: Home / Self Care | Payer: PPO | Source: Ambulatory Visit | Attending: Oncology | Admitting: Oncology

## 2018-07-18 ENCOUNTER — Ambulatory Visit (HOSPITAL_COMMUNITY)
Admission: RE | Admit: 2018-07-18 | Discharge: 2018-07-18 | Disposition: A | Discharge status: Home / Self Care | Payer: PPO | Source: Ambulatory Visit | Attending: Oncology | Admitting: Oncology

## 2018-07-18 DIAGNOSIS — R918 Other nonspecific abnormal finding of lung field: Secondary | ICD-10-CM | POA: Insufficient documentation

## 2018-07-18 DIAGNOSIS — C61 Malignant neoplasm of prostate: Secondary | ICD-10-CM | POA: Diagnosis not present

## 2018-07-18 DIAGNOSIS — N1330 Unspecified hydronephrosis: Secondary | ICD-10-CM | POA: Diagnosis not present

## 2018-07-18 DIAGNOSIS — C6292 Malignant neoplasm of left testis, unspecified whether descended or undescended: Secondary | ICD-10-CM | POA: Diagnosis not present

## 2018-07-18 DIAGNOSIS — I700 Atherosclerosis of aorta: Secondary | ICD-10-CM | POA: Diagnosis not present

## 2018-07-18 MED ORDER — HEPARIN SOD (PORK) LOCK FLUSH 100 UNIT/ML IV SOLN
INTRAVENOUS | Status: AC
  Filled 2018-07-18: qty 5

## 2018-07-18 MED ORDER — TECHNETIUM TC 99M MEDRONATE IV KIT
21.5000 | PACK | Freq: Once | INTRAVENOUS | Status: AC | PRN
  Administered 2018-07-18: 21.5 via INTRAVENOUS

## 2018-07-18 MED ORDER — IOPAMIDOL (ISOVUE-300) INJECTION 61%
INTRAVENOUS | Status: AC
  Filled 2018-07-18: qty 100

## 2018-07-18 MED ORDER — IOPAMIDOL (ISOVUE-300) INJECTION 61%
100.0000 mL | Freq: Once | INTRAVENOUS | Status: AC | PRN
  Administered 2018-07-18: 100 mL via INTRAVENOUS

## 2018-07-18 MED ORDER — HEPARIN SOD (PORK) LOCK FLUSH 100 UNIT/ML IV SOLN
500.0000 [IU] | Freq: Once | INTRAVENOUS | Status: AC
  Administered 2018-07-18: 500 [IU] via INTRAVENOUS

## 2018-07-20 ENCOUNTER — Other Ambulatory Visit: Payer: PPO

## 2018-07-22 ENCOUNTER — Telehealth: Payer: Self-pay

## 2018-07-22 ENCOUNTER — Inpatient Hospital Stay: Payer: PPO | Attending: Oncology | Admitting: Oncology

## 2018-07-22 VITALS — BP 152/88 | HR 79 | Temp 98.4°F | Resp 18 | Ht 62.0 in | Wt 140.7 lb

## 2018-07-22 DIAGNOSIS — Z5111 Encounter for antineoplastic chemotherapy: Secondary | ICD-10-CM | POA: Diagnosis not present

## 2018-07-22 DIAGNOSIS — C787 Secondary malignant neoplasm of liver and intrahepatic bile duct: Secondary | ICD-10-CM | POA: Diagnosis not present

## 2018-07-22 DIAGNOSIS — I10 Essential (primary) hypertension: Secondary | ICD-10-CM | POA: Insufficient documentation

## 2018-07-22 DIAGNOSIS — E291 Testicular hypofunction: Secondary | ICD-10-CM | POA: Diagnosis not present

## 2018-07-22 DIAGNOSIS — C61 Malignant neoplasm of prostate: Secondary | ICD-10-CM | POA: Insufficient documentation

## 2018-07-22 DIAGNOSIS — I700 Atherosclerosis of aorta: Secondary | ICD-10-CM | POA: Insufficient documentation

## 2018-07-22 DIAGNOSIS — C7800 Secondary malignant neoplasm of unspecified lung: Secondary | ICD-10-CM | POA: Insufficient documentation

## 2018-07-22 DIAGNOSIS — N1330 Unspecified hydronephrosis: Secondary | ICD-10-CM | POA: Insufficient documentation

## 2018-07-22 DIAGNOSIS — E876 Hypokalemia: Secondary | ICD-10-CM | POA: Insufficient documentation

## 2018-07-22 DIAGNOSIS — Z5189 Encounter for other specified aftercare: Secondary | ICD-10-CM | POA: Insufficient documentation

## 2018-07-22 DIAGNOSIS — Z79899 Other long term (current) drug therapy: Secondary | ICD-10-CM | POA: Insufficient documentation

## 2018-07-22 MED ORDER — PROCHLORPERAZINE MALEATE 10 MG PO TABS: 10.0000 mg | ORAL_TABLET | Freq: Four times a day (QID) | ORAL | 0 refills | Status: DC | PRN

## 2018-07-22 NOTE — Progress Notes (Signed)
DISCONTINUE ON PATHWAY REGIMEN - Prostate     Daily:     Abiraterone acetate        Dose Mod: None     Prednisone        Dose Mod: None  **Always confirm dose/schedule in your pharmacy ordering system**  REASON: Disease Progression PRIOR TREATMENT: POS76: Abiraterone 1,000 mg Daily + Prednisone 5 mg BID  Until Progression or Toxicity TREATMENT RESPONSE: Partial Response (PR)  START ON PATHWAY REGIMEN - Prostate     A cycle is every 21 days.:     Cabazitaxel      Prednisone   **Always confirm dose/schedule in your pharmacy ordering system**  Patient Characteristics: Adenocarcinoma, Metastatic, Castration Resistant, Symptomatic, Prior Docetaxel/Docetaxel Ineligible Current radiographic evidence of distant metastasis<= Yes Histology: Adenocarcinoma AJCC T Category: cTX Gleason Primary: X AJCC N Category: NX Gleason Secondary: X AJCC M Category: M1c Gleason Score: 8 AJCC 8 Stage Grouping: IVB PSA Values (ng/mL): ? 20  Intent of Therapy: Non-Curative / Palliative Intent, Discussed with Patient

## 2018-07-22 NOTE — Progress Notes (Signed)
Hematology and Oncology Follow Up Visit  Johnston Johnston [id number] 1951/10/09 67 y.o. 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  At that time he had a Gleason score of 8 PSA 40 and disease to the liver.  With documented disease metastasis to the liver.   Prior Therapy:  He is started [place] on 06/27/2013. He subsequently received definitive radiation therapy for a planned dose of 75 gray completed in November 2014 under the care of Dr. Tammi Klippel.   He developed castration resistant disease with biopsy-proven liver metastasis in May 2016.  Docetaxel 75 mg/m given every 3 weeks with Neulasta support. He is status post 10 cycles of treatment concluded on 11/21/2015. He had an excellent PSA response with PSA dropping from 222 to 0.17.   PSA started to rise again with PSA up to 8.9 in August 2017.  He is status post orchiectomy completed in February 2019.  Current therapy: Zytiga 1000 mg with prednisone since September 2017.   Interim History:  Johnston Johnston is here for a follow-up visit.  Since her last visit, he reports no major complaints or symptoms.  He had a stent replaced by Dr. Jeffie Pollock without any complications.  He is able to eat reasonably well but does report periodic increase in his blood pressure.  He does not report any bone pain or pathological fractures.  He denies any early satiety or abdominal distention.  His performance status is stable without any deterioration.  He still able to attend to activities of daily living.  He does not report any headaches, blurry vision or syncope.  He denies any effusion or dizziness.  He does not report any fevers or chills.  Eats well without any changes.  He does not report any chest pain, palpitation, orthopnea or leg edema.  He does not report any cough, wheezing or hemoptysis.  Denies any shortness of breath or dyspnea on exertion.  He  does not report any frequency urgency and no hematuria.  He does not report any pathological fractures or any bone pain. He does not report any lymphadenopathy or petechiae.  He denies any bleeding or clotting tendencies.  He does not report any skin rashes or lesions.   Remainder of his review of systems is negative.  Medications: I have reviewed the patient's current medications.   Current Outpatient Medications  Medication Sig Dispense Refill  . abiraterone acetate (ZYTIGA) 250 MG tablet Take 4 tablets (1,000 mg total) by mouth daily. Take on an empty stomach 1 hour before or 2 hours after a meal (Patient taking differently: Take 1,000 mg by mouth daily. Take on an empty stomach 1 hour before or 2 hours after a meal) 120 tablet 3  . predniSONE (DELTASONE) 5 MG tablet TAKE 1 TABLET BY MOUTH 2 TIMES DAILY WITH A MEAL. 60 tablet 3   No current facility-administered medications for this visit.    Facility-Administered Medications Ordered in Other Visits  Medication Dose Route Frequency Provider Last Rate Last Dose  . heparin lock flush 100 unit/mL  500 Units Intracatheter Once Wyatt Portela, MD      . sodium chloride flush (NS) 0.9 % injection 10 mL  10 mL Intracatheter Once Wyatt Portela, MD         Allergies:  Allergies  Allergen Reactions  . Aspirin Hives    Past Medical History, Surgical history, Social history, and Family History were reviewed and  updated.    Physical Exam:  Blood pressure (!) 152/88, pulse 79, temperature 98.4 F (36.9 C), temperature source Oral, resp. rate 18, height 5\' 2"  (1.575 m), weight 140 lb 11.2 oz (63.8 kg), SpO2 99 %.   ECOG: 1 General appearance: Alert, awake gentleman without distress. Head: Atraumatic without abnormalities. Oropharynx: Mucous membranes are moist and pink. Eyes: Sclera anicteric. Lymph nodes: No cervical, supraclavicular, inguinal or axillary lymphadenopathy.   Heart: Regular rate and rhythm.  S1 and S2 without leg  edema. Lung: Clear in all lung fields without any rhonchi, wheezes or dullness to percussion. Abdomin: Soft, without any rebound or guarding.  No shifting dullness or ascites. Musculoskeletal: No joint deformity or effusion. Skin: No petechiae or rashes. Neurological: No deficits noted.  Lab Results: Lab Results  Component Value Date   WBC 6.7 07/15/2018   HGB 11.5 (L) 07/15/2018   HCT 34.7 (L) 07/15/2018   MCV 82.4 07/15/2018   PLT 209 07/15/2018     Chemistry      Component Value Date/Time   NA 143 07/15/2018 0851   NA 142 12/17/2017 1303   K 3.6 07/15/2018 0851   K 3.2 (L) 12/17/2017 1303   CL 106 07/15/2018 0851   CO2 26 07/15/2018 0851   CO2 28 12/17/2017 1303   BUN 19 07/15/2018 0851   BUN 14.0 12/17/2017 1303   CREATININE 0.95 07/15/2018 0851   CREATININE 1.1 12/17/2017 1303      Component Value Date/Time   CALCIUM 9.3 07/15/2018 0851   CALCIUM 9.0 12/17/2017 1303   ALKPHOS 95 07/15/2018 0851   ALKPHOS 85 12/17/2017 1303   AST 14 (L) 07/15/2018 0851   AST 12 12/17/2017 1303   ALT 14 07/15/2018 0851   ALT 15 12/17/2017 1303   BILITOT 0.5 07/15/2018 0851   BILITOT 0.41 12/17/2017 1303      Results for Johnston Johnston, Johnston Johnston (MRN [id number]) as of 07/22/2018 08:28  Ref. Range 05/26/2018 09:18 07/15/2018 08:51  Prostate Specific Ag, Serum Latest Ref Range: 0.0 - 4.0 ng/mL 6.0 (H) 12.5 (H)    EXAM: CT ABDOMEN AND PELVIS WITH CONTRAST  TECHNIQUE: Multidetector CT imaging of the abdomen and pelvis was performed using the standard protocol following bolus administration of intravenous contrast.  CONTRAST:  168mL ISOVUE-300 IOPAMIDOL (ISOVUE-300) INJECTION 61%  COMPARISON:  12/17/2017 CT abdomen/pelvis.  FINDINGS: Lower chest: There are several subpleural pulmonary nodules scattered in far basilar right lower lobe, all increased in size since 12/17/2017 CT abdomen study. For example a 0.8 cm right lower lobe nodule (series 4/image 13), increased from 0.6 cm. A 1.6 x  0.5 cm right lower lobe nodule (series 4/image 28), increased from 0.7 x 0.3 cm. A 0.8 cm right lower lobe nodule (series 4/image 24), increased from 0.2 cm. Superior approach central venous catheter is seen terminating in the right atrium.  Hepatobiliary: There is a new 2.3 x 2.1 cm hypodense segment 5 right liver lobe mass (series 2/image 19). No additional new liver lesions. Segment 7 right liver lobe 1 5 x 0.7 cm hypodense lesion (series 2/image 10), previously 1.4 x 0.8 cm, not appreciably changed. Subcentimeter segment 4A left liver lobe lesion is too small to characterize and stable. Normal gallbladder with no radiopaque cholelithiasis. No biliary ductal dilatation.  Pancreas: Normal, with no mass or duct dilation.  Spleen: Normal size. No mass.  Adrenals/Urinary Tract: Normal adrenals. Left nephroureteral stent is well positioned with the proximal pigtail portion in the left renal pelvis and the distal pigtail portion  in the right bladder. There is moderate left hydroureteronephrosis to the level of mid left ureter, worsened since 12/17/2017 CT. Periureteric soft tissue encasing pelvic segment of the left ureter measures up to 1.1 x 0.8 cm (series 2/image 67), previously 1.1 x 0.8 cm using similar measurement technique, stable. There is a delayed left contrast nephrogram. Simple 1.1 cm posterior lower left renal cyst. No right hydronephrosis. Normal bladder.  Stomach/Bowel: Normal non-distended stomach. Normal caliber small bowel with no small bowel wall thickening. Normal appendix. Normal large bowel with no diverticulosis, large bowel wall thickening or pericolonic fat stranding.  Vascular/Lymphatic: Atherosclerotic nonaneurysmal abdominal aorta. Patent portal, splenic, hepatic and renal veins. No pathologically enlarged lymph nodes in the abdomen or pelvis.  Reproductive: Stable atrophic prostate with surrounding fiducial markers.  Other: No  pneumoperitoneum, ascites or focal fluid collection.  Musculoskeletal: No aggressive appearing focal osseous lesions. Moderate thoracolumbar spondylosis.  IMPRESSION: 1. New hypodense 2.4 cm metastasis in the segment 5 right liver lobe. 2. Several solid pulmonary nodules at the right lung base have increased in size, worrisome for growing pulmonary metastases. 3. Infiltrative soft tissue encasing the pelvic segment of the left ureter is stable in size. Despite appropriate positioning of the left nephroureteral stent, there is worsened moderate left hydroureteronephrosis with delayed left contrast nephrogram, suggesting stent dysfunction/persistent distal left ureteral obstruction. 4.  Aortic Atherosclerosis (ICD10-I70.0).    Impression and Plan:  67 year old man with:  1.  Castration-resistant prostate cancer diagnosed in 2014.  Status post therapies outlined above and has developed castration resistant disease in 2016.  He remains on Zytiga without any major complications.  His PSA has started to rise and imaging studies obtained on 07/18/2018 were reviewed today.  His imaging studies showed no bone metastasis but did show recurrence of hers hepatic lesions and worsening pulmonary metastasis.  The natural course of this disease was reviewed today with the patient via an interpreter.  Treatment options as well were discussed.  I recommend switching Zytiga to systemic chemotherapy for better control of his disease.  This would be in the form of Jevtana chemotherapy which she has not been exposed to.  Complication associated with this therapy include nausea, vomiting, myelosuppression, neutropenia, neutropenic sepsis (less likely) and peripheral neuropathy.  After discussion today, he will receive Jevtana 20 mg/m every 3 weeks with growth factor support.  This will be given his Port-A-Cath.  2.  Hepatic metastasis: CT scan obtained in July 2019 showed worsening hepatic metastasis that  has been documented in the past to be prostate cancer.  3.  IV access: Port-A-Cath will be be utilized for systemic chemotherapy.  4. Hypokalemia: Potassium remains in normal range despite being on Zytiga.  5. Prognosis: His disease remains incurable but treatable at this time.  His performance status is excellent and aggressive therapy is warranted.  6.  Mild hydronephrosis:   His stent was replaced recently by Dr. Jeffie Pollock.  Creatinine remains stable.  7.  Androgen depravation: Testosterone remains at castrate level after orchiectomy.  8.  Hypertension: Discontinuation of Zytiga will help his blood pressure moving forward.  9.  Antiemetics: Prescription for Compazine was given to the patient today.  10. Follow-up: Will be in 1 week to start systemic chemotherapy and he will be checked every 3 weeks before his each cycle.  25  minutes was spent with the patient face-to-face today.  More than 50% of time was dedicated to discussion of the natural course of this disease and treatment options.  We also  reviewed complications associated with chemotherapy and future of care.    Zola Button, MD 8/2/20198:38 AM

## 2018-07-22 NOTE — Telephone Encounter (Signed)
Printed avs and calender of upcoming appointment. Per 8/2 los Patient speak no english his interpreter told him upon arrival to the clinic on Saturday to go straight back to infusion for injection, and if possible to bring a family member who speak english.

## 2018-07-29 ENCOUNTER — Inpatient Hospital Stay: Payer: PPO

## 2018-07-29 ENCOUNTER — Other Ambulatory Visit: Payer: Self-pay | Admitting: *Deleted

## 2018-07-29 VITALS — BP 146/86 | HR 71 | Temp 97.9°F | Resp 20

## 2018-07-29 DIAGNOSIS — C61 Malignant neoplasm of prostate: Secondary | ICD-10-CM

## 2018-07-29 DIAGNOSIS — Z95828 Presence of other vascular implants and grafts: Secondary | ICD-10-CM

## 2018-07-29 DIAGNOSIS — Z5111 Encounter for antineoplastic chemotherapy: Secondary | ICD-10-CM | POA: Diagnosis not present

## 2018-07-29 LAB — CMP (CANCER CENTER ONLY)
ALBUMIN: 3.2 g/dL — AB (ref 3.5–5.0)
ALK PHOS: 86 U/L (ref 38–126)
ALT: 15 U/L (ref 0–44)
AST: 14 U/L — ABNORMAL LOW (ref 15–41)
Anion gap: 11 (ref 5–15)
BILIRUBIN TOTAL: 0.6 mg/dL (ref 0.3–1.2)
BUN: 11 mg/dL (ref 8–23)
CO2: 23 mmol/L (ref 22–32)
CREATININE: 1.09 mg/dL (ref 0.61–1.24)
Calcium: 8.9 mg/dL (ref 8.9–10.3)
Chloride: 106 mmol/L (ref 98–111)
GFR, Est AFR Am: >60 mL/min (ref >60)
GFR, Estimated: >60 mL/min (ref >60)
GLUCOSE: 107 mg/dL — AB (ref 70–99)
Potassium: 3.4 mmol/L — ABNORMAL LOW (ref 3.5–5.1)
Sodium: 140 mmol/L (ref 135–145)
Total Protein: 7.1 g/dL (ref 6.5–8.1)

## 2018-07-29 LAB — CBC WITH DIFFERENTIAL (CANCER CENTER ONLY)
BASOS PCT: 1 %
Basophils Absolute: 0 10*3/uL (ref 0.0–0.1)
EOS ABS: 0.2 10*3/uL (ref 0.0–0.5)
Eosinophils Relative: 6 %
HEMATOCRIT: 31.9 % — AB (ref 38.4–49.9)
Hemoglobin: 10.5 g/dL — ABNORMAL LOW (ref 13.0–17.1)
Lymphocytes Relative: 19 %
Lymphs Abs: 0.8 10*3/uL — ABNORMAL LOW (ref 0.9–3.3)
MCH: 26.9 pg — ABNORMAL LOW (ref 27.2–33.4)
MCHC: 33 g/dL (ref 32.0–36.0)
MCV: 81.5 fL (ref 79.3–98.0)
MONO ABS: 0.4 10*3/uL (ref 0.1–0.9)
MONOS PCT: 9 %
Neutro Abs: 2.6 10*3/uL (ref 1.5–6.5)
Neutrophils Relative %: 65 %
PLATELETS: 225 10*3/uL (ref 140–400)
RBC: 3.92 MIL/uL — ABNORMAL LOW (ref 4.20–5.82)
RDW: 13.6 % (ref 11.0–14.6)
WBC Count: 4 10*3/uL (ref 4.0–10.3)

## 2018-07-29 MED ORDER — SODIUM CHLORIDE 0.9 % IV SOLN
20.0000 mg/m2 | Freq: Once | INTRAVENOUS | Status: AC
  Administered 2018-07-29: 33 mg via INTRAVENOUS
  Filled 2018-07-29: qty 3.3

## 2018-07-29 MED ORDER — DEXAMETHASONE SODIUM PHOSPHATE 10 MG/ML IJ SOLN
INTRAMUSCULAR | Status: AC
  Filled 2018-07-29: qty 1

## 2018-07-29 MED ORDER — FAMOTIDINE IN NACL 20-0.9 MG/50ML-% IV SOLN
INTRAVENOUS | Status: AC
  Filled 2018-07-29: qty 50

## 2018-07-29 MED ORDER — DEXAMETHASONE SODIUM PHOSPHATE 10 MG/ML IJ SOLN
10.0000 mg | Freq: Once | INTRAMUSCULAR | Status: AC
  Administered 2018-07-29: 10 mg via INTRAVENOUS

## 2018-07-29 MED ORDER — SODIUM CHLORIDE 0.9% FLUSH
10.0000 mL | INTRAVENOUS | Status: DC | PRN
  Administered 2018-07-29: 10 mL
  Filled 2018-07-29: qty 10

## 2018-07-29 MED ORDER — SODIUM CHLORIDE 0.9% FLUSH
10.0000 mL | Freq: Once | INTRAVENOUS | Status: AC
  Administered 2018-07-29: 10 mL
  Filled 2018-07-29: qty 10

## 2018-07-29 MED ORDER — DIPHENHYDRAMINE HCL 50 MG/ML IJ SOLN
25.0000 mg | Freq: Once | INTRAMUSCULAR | Status: AC
Start: 2018-07-29 — End: 2018-07-29
  Administered 2018-07-29: 25 mg via INTRAVENOUS

## 2018-07-29 MED ORDER — SODIUM CHLORIDE 0.9 % IV SOLN
Freq: Once | INTRAVENOUS | Status: AC
  Administered 2018-07-29: 10:00:00 via INTRAVENOUS
  Filled 2018-07-29: qty 250

## 2018-07-29 MED ORDER — HEPARIN SOD (PORK) LOCK FLUSH 100 UNIT/ML IV SOLN
500.0000 [IU] | Freq: Once | INTRAVENOUS | Status: AC | PRN
  Administered 2018-07-29: 500 [IU]
  Filled 2018-07-29: qty 5

## 2018-07-29 MED ORDER — FAMOTIDINE IN NACL 20-0.9 MG/50ML-% IV SOLN
20.0000 mg | Freq: Once | INTRAVENOUS | Status: AC
Start: 2018-07-29 — End: 2018-07-29
  Administered 2018-07-29: 20 mg via INTRAVENOUS

## 2018-07-29 MED ORDER — DIPHENHYDRAMINE HCL 50 MG/ML IJ SOLN
INTRAMUSCULAR | Status: AC
Start: 2018-07-29 — End: ?
  Filled 2018-07-29: qty 1

## 2018-07-29 MED ORDER — DEXAMETHASONE SODIUM PHOSPHATE 100 MG/10ML IJ SOLN: 10.0000 mg | Freq: Once | INTRAMUSCULAR | Status: DC

## 2018-07-29 MED ORDER — CLONIDINE HCL 0.1 MG PO TABS
0.1000 mg | ORAL_TABLET | Freq: Once | ORAL | Status: AC
  Administered 2018-07-29: 0.1 mg via ORAL

## 2018-07-29 NOTE — Patient Instructions (Signed)
[place] Discharge Instructions for Patients Receiving Chemotherapy  Today you received the following chemotherapy agents Jevtana  To help prevent nausea and vomiting after your treatment, we encourage you to take your nausea medication as needed If you develop nausea and vomiting that is not controlled by your nausea medication, call the clinic.   BELOW ARE SYMPTOMS THAT SHOULD BE REPORTED IMMEDIATELY:  *FEVER GREATER THAN 100.5 F  *CHILLS WITH OR WITHOUT FEVER  NAUSEA AND VOMITING THAT IS NOT CONTROLLED WITH YOUR NAUSEA MEDICATION  *UNUSUAL SHORTNESS OF BREATH  *UNUSUAL BRUISING OR BLEEDING  TENDERNESS IN MOUTH AND THROAT WITH OR WITHOUT PRESENCE OF ULCERS  *URINARY PROBLEMS  *BOWEL PROBLEMS  UNUSUAL RASH Items with * indicate a potential emergency and should be followed up as soon as possible.  Feel free to call the clinic should you have any questions or concerns. The clinic phone number is [phone].  Please show the [place] at check-in to the Emergency Department and triage nurse.   Cabazitaxel injection ?y l thu?c g? CABAZITAXEL l thu?c ha tr? li?u. N ???c dng ?? ?i?u tr? ung th? nhi?p h? tuy?n. N nh?m ??n cc t? bo phn chia nhanh, nh? cc t? bo ung th?, v lm cho cc t? bo ? ch?t. Thu?c ny c th? ???c dng cho nh?ng m?c ?ch khc; hy h?i ng??i cung c?p d?ch v? y t? ho?c d??c s? c?a mnh, n?u qu v? c th?c m?c. (CC) NHN HI?U PH? BI?N: Jevtana Ti c?n ph?i bo cho ng??i cung c?p d?ch v? y t? c?a mnh ?i?u g tr??c khi dng thu?c ny? H? c?n bi?t li?u qu v? c b?t k? tnh tr?ng no sau ?y khng: -ti?n s? ch?y mu bao t? -b?nh th?n -b?nh gan -s? l??ng t? bo mu th?p, ch?ng h?n nh? s? l??ng b?ch c?u, ti?u c?u, ho?c h?ng c?u th?p -b?nh ph?i ho??c h h?p, ch??ng ha?n nh? hen suy?n -m?i x? tr? g?n ?y ho?c ?ang x? tr? -dng cc thu?c ?? ?i?u tr? ho?c phng ng?a c?c mu ?ng -pha?n ??ng b?t th???ng ho??c di?  ??ng v??i cabazitaxel ho?c polysorbate 80 -pha?n ??ng b?t th???ng ho??c di? ??ng v??i ca?c d??c ph?m kha?c, th?c ph?m, thu?c nhu?m, ho??c ch?t ba?o qua?n -?ang c thai ho??c ??nh co? thai -?ang cho con bu? Ti nn s? d?ng thu?c ny nh? th? no? Thu?c ny ?? truy?n vo t?nh m?ch. Thu?c ny ???c s? d?ng b?i chuyn vin y t? ? b?nh vi?n ho?c ? phng m?ch. Hy bn v?i bc s? nhi khoa c?a qu v? v? vi?c dng thu?c ny ? tr? em. C th? c?n ch?m [place] ??c bi?t. Qu li?u: N?u qu v? cho r?ng mnh ? dng qu nhi?u thu?c ny, th hy lin l?c v?i trung tm ki?m sot ch?t ??c ho?c phng c?p c?u ngay l?p t?c. L?U : Thu?c ny ch? dnh ring cho qu v?. Khng chia s? thu?c ny v?i nh?ng ng??i khc. N?u ti l? qun m?t li?u th sao? ?i?u quan tr?ng l khng nn b? l? li?u thu?c no. Hy lin l?c v?i bc s? ho?c [place] vin y t? c?a mnh, n?u qu v? khng th? gi? ?ng cu?c h?n khm. Nh?ng g c th? t??ng tc v?i thu?c ny? -cc thu?c dng ?? tr? nhi?m HIV ho?c AIDS -clarithromycin -m?t s? thu?c dng ?? tr? cc b?nh nhi?m n?m, ch?ng h?n nh? fluconazole, itraconazole, ketoconazole, voriconazole -nefazodone -telithromycin Danh sch ny c th? khng m t? ?? h?t cc t??ng  tc c th? x?y ra. Hy ??a cho ng??i cung c?p d?ch v? y t? c?a mnh danh sch t?t c? cc thu?c, th?o d??c, cc thu?c khng c?n toa, ho?c cc ch? ph?m b? sung m qu v? dng. C?ng nn bo cho h? bi?t r?ng qu v? c ht thu?c, u?ng r??u, ho?c c s? d?ng ma ty tri php hay khng. Vi th? c th? t??ng tc v?i thu?c c?a qu v?. Ti c?n ph?i theo di ?i?u g trong khi dng thu?c ny? Qu v? s? ???c theo di ch?t ch? trong khi dng thu?c ny. Thu?c ny c th? lm cho qu v? c?m th?y khng ???c kh?e nh? th??ng l?. ?i?u ny khng ph?i khng ph? bi?n, b?i v thu?c ha tr? li?u c th? ?nh h??ng ??n c? t? bo lnh l?n t? bo ung th?. Hy t??ng trnh m?i tc d?ng ph?. Hy ti?p t?c ??t ?i?u tr? c?a mnh ngay c? khi qu v? c?m th?y m?t, tr? khi bc s? yu  c?u qu v? ng?ng ?i?u tr?. Hy h?i  ki?n bc s? ho?c chuyn vin y t?, n?u qu v? b? s?t, ?n l?nh ho?c ?au h?ng, ho?c c cc tri?u ch?ng khc c?a c?m l?nh ho?c cm. Khng ???c t? ?i?u tr? cho mnh. Thu?c ny c th? lm gi?m kh? n?ng ch?ng l?i cc b?nh nhi?m trng c?a c? th?. Hy c? trnh ? g?n nh?ng ng??i b? b?nh. Thu?c ny c th? lm t?ng nguy c? b? b?m tm ho?c ch?y mu. Hy lin l?c v?i bc s? ho?c chuyn vin y t?, n?u qu v? th?y ch?y mu b?t th??ng. Hy c?n th?n khi ?nh r?ng ho?c x?a r?ng b?ng ch? nha khoa ho?c b?ng t?m, b?i v qu v? c th? d? b? nhi?m trng ho?c d? b? ch?y mu h?n. N?u qu v? c ?i lm r?ng, th hy bo v?i nha s? r?ng qu v? ?ang dng thu?c ny. Trnh dng cc thu?c c ch?a aspirin, acetaminophen, ibuprofen, naproxen, ho?c ketoprofen, tr? khi ? ???c bc s? ch? d?n. Cc thu?c ny c th? che l?p tri?u ch?ng s?t. Khng ???c ?? c thai trong khi dng thu?c ny. Ph? n? c?n ph?i thng bo cho bc s? c?a mnh, n?u mu?n c thai ho?c ngh? r?ng c th? mnh ? c [place]. Nam gi??i khng nn gy thu? thai trong khi du?ng thu?c na?y va? trong vng 3 tha?ng sau khi ng?ng thu?c. C nguy c? v? cc tc d?ng ph? nghim tr?ng ??i v?i [place] nhi. Hy th?o lu?n v?i bc s? ho?c chuyn vin y t? ho?c d??c s? ?? bi?t thm thng tin. Khng ???c nui con b?ng s?a m? trong khi dng thu?c ny. Ti c th? nh?n th?y nh?ng tc d?ng ph? no khi dng thu?c ny? Nh?ng tc d?ng ph? qu v? c?n ph?i bo cho bc s? ho?c chuyn vin y t? cng s?m cng t?t: -cc ph?n ?ng d? ?ng, ch?ng h?n nh? da b? m?n ??, ng?a, n?i my ?ay, s?ng ? m?t, mi, ho?c l??i -kh th? -to bn -tiu ch?y -?au, t ho?c c?m gic nh? b? ki?n b ? bn tay ho?c bn chn -?au b?ng d? d?i -cc d?u hi?u nhi?m trng - s?t ho?c ?n l?nh, ho, ?au h?ng, kh ?i ti?u ho?c ?i ti?u ?au -cc d?u hi?u v tri?u ch?ng t?n th??ng th?n, nh? kh ?i ti?u ho?c thay ??i l??ng n??c ti?u -cc d?u hi?u gi?m ti?u c?u ho?c xu?t huy?t - b?m tm, cc n?t l?m t?m ?? trn  da, phn  c mu ?en, mu h?c n, c mu trong n??c ti?u -cc d?u hi?u gi?m s? l??ng t? bo h?ng c?u - c?m th?y y?u ?t ho?c m?t m?i m?t cch b?t th??ng, cc c?n ng?t x?u, chong vng -i m?a Cc tc d?ng ph? khng c?n ph?i ch?m [place] y t? (hy bo cho bc s? ho?c chuyn vin y t?, n?u cc tc d?ng ph? ny ti?p di?n ho?c gy phi?n toi): -?au l?ng -th?y v? c?a cc mn ?n thay ??i -r?ng tc -?au ??u -m?t c?m gic ngon mi?ng -?au ? kh?p ho?c c? b?p -bu?n i -kh ch?u ? bao t? Danh sch ny c th? khng m t? ?? h?t cc tc d?ng ph? c th? x?y ra. Xin g?i t?i bc s? c?a mnh ?? ???c c? v?n chuyn mn v? cc tc d?ng ph?Sander Nephew v? c th? t??ng trnh cc tc d?ng ph? cho FDA theo s? [phone]. Ti nn c?t gi? thu?c c?a mnh ? ?u? Thu?c ny ???c s? d?ng b?i chuyn vin y t? ? b?nh vi?n ho?c ? phng m?ch. Qu v? s? khng ???c c?p thu?c ny ?? c?t gi? t?i nh. L?U : ?y l b?n tm t?t. N c th? khng bao hm t?t c? thng tin c th? c. N?u qu v? th?c m?c v? thu?c ny, xin trao ??i v?i bc s?, d??c s?, ho?c ng??i cung c?p d?ch v? y t? c?a mnh.  2018 Elsevier/Gold Standard (2017-01-07 00:00:00)

## 2018-07-29 NOTE — Progress Notes (Signed)
Per Erline Levine, RN for Dr. Alen Blew okay to treat pt today with BP of 171/90.  Stacey placed order for 0.1mg  p.o clonidine for pt to receive prior to treatment

## 2018-07-30 ENCOUNTER — Inpatient Hospital Stay: Payer: PPO

## 2018-07-30 VITALS — BP 168/88 | HR 98 | Temp 98.5°F | Resp 16

## 2018-07-30 DIAGNOSIS — C61 Malignant neoplasm of prostate: Secondary | ICD-10-CM

## 2018-07-30 DIAGNOSIS — Z5111 Encounter for antineoplastic chemotherapy: Secondary | ICD-10-CM | POA: Diagnosis not present

## 2018-07-30 LAB — PROSTATE-SPECIFIC AG, SERUM (LABCORP): PROSTATE SPECIFIC AG, SERUM: 11.7 ng/mL — AB (ref 0.0–4.0)

## 2018-07-30 MED ORDER — PEGFILGRASTIM-CBQV 6 MG/0.6ML ~~LOC~~ SOSY
PREFILLED_SYRINGE | SUBCUTANEOUS | Status: AC
  Filled 2018-07-30: qty 0.6

## 2018-07-30 MED ORDER — PEGFILGRASTIM-CBQV 6 MG/0.6ML ~~LOC~~ SOSY
6.0000 mg | PREFILLED_SYRINGE | Freq: Once | SUBCUTANEOUS | Status: AC
  Administered 2018-07-30: 6 mg via SUBCUTANEOUS

## 2018-07-30 NOTE — Patient Instructions (Signed)
Pegfilgrastim injection ?y l thu?c g? PEGFILGRASTIM l y?u t? kch thch dng b?ch c?u h?t (granulocyte colony-stimulating factor) tc d?ng ko di c tc d?ng kch thch s? sinh tr??ng c?a b?ch c?u trung tnh (neutrophil), l m?t lo?i t? bo b?ch c?u quan tr?ng trong kh? n?ng khng nhi?m trng c?a c? th?. Thu?c ???c dng ?? gi?m t? l? b? s?t v nhi?m trng ? nh?ng b?nh nhn b? m?t s? lo?i ung th? no ? ?ang ???c ha tr? li?u gy ?nh h??ng ??n t?y x??ng, v ?? t?ng kh? n?ng s?ng st sau khi b? ph?i nhi?m v?i phng x? li?u cao. Thu?c ny c th? ???c dng cho nh?ng m?c ?ch khc; hy h?i ng??i cung c?p d?ch v? y t? ho?c d??c s? c?a mnh, n?u qu v? c th?c m?c. (CC) NHN HI?U PH? BI?N: [place] Ti c?n ph?i bo cho ng??i cung c?p d?ch v? y t? c?a mnh ?i?u g tr??c khi dng thu?c ny? H? c?n bi?t li?u qu v? c b?t k? tnh tr?ng no sau ?y khng: -b?nh th?n -d? ?ng v?i latex -?ang ???c x? tr? -b?nh h?ng c?u hnh l??i li?m -cc ph?n ?ng c?a da ??i v?i keo dnh acrylic (ch? ring D?ng C? Tim Dn Ln C? th? - On-body Injector) -ph?n ?ng b?t th??ng ho?c d? ?ng v?i pegfilgrastim ho?c filgrastim -pha?n ??ng b?t th???ng ho??c di? ??ng v??i ca?c d??c ph?m kha?c, th?c ph?m, thu?c nhu?m, ho??c ch?t ba?o qua?n -?ang c thai ho??c ??nh co? thai -?ang cho con bu? Ti nn s? d?ng thu?c ny nh? th? no? Thu?c ny ?? tim d??i da. N?u qu v? nh?n thu?c ny ? nh, th qu v? s? ???c by cch chu?n b? v dng cc ?ng tim ? ???c n?p s?n ho?c cch dng D?ng C? Tim Dn Ln C? Th? Associate Professor). Tham kh?o b?n H??ng d?n S? d?ng dnh cho b?nh nhn ?? ???c h??ng d?n chi ti?t. Hy s? d?ng ?ng nh? ? ???c ch? d?n. Dng thu?c ny vo nh?ng kho?ng th?i gian ??u nhau. Khng ???c dng thu?c ny nhi?u l?n h?n ? ???c ch? d?n. ?i?u quan tr?ng l qu v? ph?i b? kim tim v ?ng tim ? dng vo thng chuyn ??ng v?t bn nh?n. Khng ???c b? chng vo thng rc thng th??ng. N?u qu v? khng c thng chuyn ??ng  v?t bn nh?n, th hy g?i t?i d??c s? ho?c [place] vin y t? c?a mnh ?? xin m?t ci. Hy bn v?i bc s? nhi khoa c?a qu v? v? vi?c dng thu?c ny ? tr? em. Thu?c ny c th? ???c k toa trong nh?ng tr??ng h?p ch?n l?c, nh?ng c?n ph?i th?n tr?ng. Qu li?u: N?u qu v? cho r?ng mnh ? dng qu nhi?u thu?c ny, th hy lin l?c v?i trung tm ki?m sot ch?t ??c ho?c phng c?p c?u ngay l?p t?c. L?U : Thu?c ny ch? dnh ring cho qu v?. Khng chia s? thu?c ny v?i nh?ng ng??i khc. N?u ti l? qun m?t li?u th sao? ?i?u quan tr?ng l khng nn b? l? li?u thu?c no. Hy lin l?c v?i bc s? ho?c chuyn vin y t? c?a mnh n?u qu v? l? qun m?t li?u thu?c. N?u qu v? b? l? m?t li?u thu?c do D?ng C? Tim Dn Ln C? Th? Associate Professor) b? tr?c tr?c ho?c b? r r?, th c?n ph?i dng ?ng tim lo?i s? d?ng b?ng tay c n?p s?n m?t li?u thu?c ?? tim m?t li?u thu?c m?i cng s?m cng t?t.  Nh?ng g c th? t??ng tc v?i thu?c ny? Ch?a co? nghin c??u v? t??ng ta?c thu?c. Hy ??a cho ba?c si? ho??c chuyn vin y t? c?a mnh danh sch t?t c? cc thu?c, th?o d??c, cc thu?c khng c?n toa, ho?c cc ch? ph?m b? sung ti?t th?c m qu v? dng. C?ng nn bo cho h? bi?t r?ng qu v? c ht thu?c, u?ng r??u, ho?c c s? d?ng ma ty tri php hay khng. Vi th? c th? t??ng tc v?i thu?c na?y. Danh sch ny c th? khng m t? ?? h?t cc t??ng tc c th? x?y ra. Hy ??a cho ng??i cung c?p d?ch v? y t? c?a mnh danh sch t?t c? cc thu?c, th?o d??c, cc thu?c khng c?n toa, ho?c cc ch? ph?m b? sung m qu v? dng. C?ng nn bo cho h? bi?t r?ng qu v? c ht thu?c, u?ng r??u, ho?c c s? d?ng ma ty tri php hay khng. Vi th? c th? t??ng tc v?i thu?c c?a qu v?. Ti c?n ph?i theo di ?i?u g trong khi dng thu?c ny? Qu v? s? c?n ?i lm cc xt nghi?m mu ??nh k? trong khi qu v? dng thu?c ny. N?u qu v? s?p ph?i ch?p MRI, ch?p CT scan, ho?c lm th? thu?t khc, th hy bo cho bc s? c?a mnh bi?t r?ng qu v? ?ang dng thu?c  ny (Ch? ring D?ng C? Tim Dn Ln C? th? - On-body Injector). Ti c th? nh?n th?y nh?ng tc d?ng ph? no khi dng thu?c ny? Nh?ng tc d?ng ph? qu v? c?n ph?i bo cho bc s? ho?c chuyn vin y t? cng s?m cng t?t: -cc ph?n ?ng d? ?ng, ch?ng h?n nh? da b? m?n ??, ng?a, n?i my ?ay, s?ng ? m?t, mi, ho?c l??i -chng m?t -s?t -?au, ??, ng?a, ho?c kch ?ng ? ch? tim -cc n?t l?m t?m ?? trn da -n??c ti?u c mu ?? ho?c mu nu s?m -kh th? ho?c cc v?n ?? v? h h?p -?au b?ng ho?c ?au bn m?ng s??n, ho?c ?au vai -s?ng -c?m th?y m?t m?i -kh ?i ti?u ho?c thay ??i l??ng n??c ti?u ???c bi ti?t Cc tc d?ng ph? khng c?n ph?i ch?m [place] y t? (hy bo cho bc s? ho?c chuyn vin y t?, n?u cc tc d?ng ph? ny ti?p di?n ho?c gy phi?n toi): -?au x??ng -?au ho?c nh?c c? b?p Danh sch ny c th? khng m t? ?? h?t cc tc d?ng ph? c th? x?y ra. Xin g?i t?i bc s? c?a mnh ?? ???c c? v?n chuyn mn v? cc tc d?ng ph?Sander Nephew v? c th? t??ng trnh cc tc d?ng ph? cho FDA theo s? [phone]. Ti nn c?t gi? thu?c c?a mnh ? ?u? ?? ngoi t?m tay tr? em. C?t gi? cc ?ng tim lo?i n?p s?n trong t? l?nh t? 2 ??n 8 ?? C (35 ??n 45 ?? F). Khng ???c ?? ?ng l?nh. Gi? trong h?p carton ?? trnh nh sng. V?t b? thu?c ny n?u n b? ?? bn ngoi t? l?nh qu 47 gi? ??ng h?. V?t b? t?t c? thu?c ch?a dng sau ngy h?t h?n in trn nhn thu?c ho?c bao thu?c. L?U : ?y l b?n tm t?t. N c th? khng bao hm t?t c? thng tin c th? c. N?u qu v? th?c m?c v? thu?c ny, xin trao ??i v?i bc s?, d??c s?, ho?c ng??i cung c?p d?ch v? y t? c?a mnh.  2018 Elsevier/Gold Standard (2015-04-19 00:00:00)

## 2018-08-02 DIAGNOSIS — I10 Essential (primary) hypertension: Secondary | ICD-10-CM | POA: Diagnosis not present

## 2018-08-02 DIAGNOSIS — C61 Malignant neoplasm of prostate: Secondary | ICD-10-CM | POA: Diagnosis not present

## 2018-08-02 DIAGNOSIS — Z23 Encounter for immunization: Secondary | ICD-10-CM | POA: Diagnosis not present

## 2018-08-02 DIAGNOSIS — Z6826 Body mass index (BMI) 26.0-26.9, adult: Secondary | ICD-10-CM | POA: Diagnosis not present

## 2018-08-02 DIAGNOSIS — R338 Other retention of urine: Secondary | ICD-10-CM | POA: Diagnosis not present

## 2018-08-02 DIAGNOSIS — C799 Secondary malignant neoplasm of unspecified site: Secondary | ICD-10-CM | POA: Diagnosis not present

## 2018-08-02 DIAGNOSIS — Z0000 Encounter for general adult medical examination without abnormal findings: Secondary | ICD-10-CM | POA: Diagnosis not present

## 2018-08-19 ENCOUNTER — Inpatient Hospital Stay (HOSPITAL_BASED_OUTPATIENT_CLINIC_OR_DEPARTMENT_OTHER): Payer: PPO | Admitting: Oncology

## 2018-08-19 ENCOUNTER — Encounter: Payer: Self-pay | Admitting: Oncology

## 2018-08-19 ENCOUNTER — Inpatient Hospital Stay: Payer: PPO

## 2018-08-19 VITALS — BP 131/78 | HR 76 | Temp 98.3°F | Resp 17 | Ht 62.0 in | Wt 136.3 lb

## 2018-08-19 DIAGNOSIS — Z5111 Encounter for antineoplastic chemotherapy: Secondary | ICD-10-CM | POA: Insufficient documentation

## 2018-08-19 DIAGNOSIS — C61 Malignant neoplasm of prostate: Secondary | ICD-10-CM | POA: Diagnosis not present

## 2018-08-19 DIAGNOSIS — Z923 Personal history of irradiation: Secondary | ICD-10-CM | POA: Diagnosis not present

## 2018-08-19 DIAGNOSIS — Z95828 Presence of other vascular implants and grafts: Secondary | ICD-10-CM

## 2018-08-19 DIAGNOSIS — C787 Secondary malignant neoplasm of liver and intrahepatic bile duct: Secondary | ICD-10-CM | POA: Diagnosis not present

## 2018-08-19 DIAGNOSIS — R5383 Other fatigue: Secondary | ICD-10-CM

## 2018-08-19 DIAGNOSIS — I700 Atherosclerosis of aorta: Secondary | ICD-10-CM | POA: Diagnosis not present

## 2018-08-19 DIAGNOSIS — E46 Unspecified protein-calorie malnutrition: Secondary | ICD-10-CM | POA: Diagnosis not present

## 2018-08-19 LAB — CBC WITH DIFFERENTIAL (CANCER CENTER ONLY)
BASOS ABS: 0.1 10*3/uL (ref 0.0–0.1)
Basophils Relative: 2 %
Eosinophils Absolute: 0 10*3/uL (ref 0.0–0.5)
Eosinophils Relative: 0 %
HEMATOCRIT: 31.3 % — AB (ref 38.4–49.9)
HEMOGLOBIN: 10.1 g/dL — AB (ref 13.0–17.1)
LYMPHS PCT: 21 %
Lymphs Abs: 1 10*3/uL (ref 0.9–3.3)
MCH: 26.6 pg — ABNORMAL LOW (ref 27.2–33.4)
MCHC: 32.3 g/dL (ref 32.0–36.0)
MCV: 82.6 fL (ref 79.3–98.0)
MONOS PCT: 12 %
Monocytes Absolute: 0.6 10*3/uL (ref 0.1–0.9)
NEUTROS ABS: 3 10*3/uL (ref 1.5–6.5)
NEUTROS PCT: 65 %
Platelet Count: 256 10*3/uL (ref 140–400)
RBC: 3.79 MIL/uL — ABNORMAL LOW (ref 4.20–5.82)
RDW: 13.7 % (ref 11.0–14.6)
WBC Count: 4.6 10*3/uL (ref 4.0–10.3)

## 2018-08-19 LAB — CMP (CANCER CENTER ONLY)
ALBUMIN: 3.6 g/dL (ref 3.5–5.0)
ALT: 11 U/L (ref 0–44)
ANION GAP: 9 (ref 5–15)
AST: 15 U/L (ref 15–41)
Alkaline Phosphatase: 92 U/L (ref 38–126)
BILIRUBIN TOTAL: 0.4 mg/dL (ref 0.3–1.2)
BUN: 16 mg/dL (ref 8–23)
CO2: 25 mmol/L (ref 22–32)
Calcium: 9.5 mg/dL (ref 8.9–10.3)
Chloride: 107 mmol/L (ref 98–111)
Creatinine: 0.91 mg/dL (ref 0.61–1.24)
GFR, Est AFR Am: >60 mL/min (ref >60)
GFR, Estimated: >60 mL/min (ref >60)
GLUCOSE: 103 mg/dL — AB (ref 70–99)
POTASSIUM: 3.9 mmol/L (ref 3.5–5.1)
Sodium: 141 mmol/L (ref 135–145)
TOTAL PROTEIN: 7.7 g/dL (ref 6.5–8.1)

## 2018-08-19 MED ORDER — SODIUM CHLORIDE 0.9% FLUSH
10.0000 mL | INTRAVENOUS | Status: DC | PRN
  Filled 2018-08-19: qty 10

## 2018-08-19 MED ORDER — SODIUM CHLORIDE 0.9 % IV SOLN
Freq: Once | INTRAVENOUS | Status: AC
  Administered 2018-08-19: 11:00:00 via INTRAVENOUS
  Filled 2018-08-19: qty 250

## 2018-08-19 MED ORDER — DRONABINOL 2.5 MG PO CAPS: 2.5000 mg | ORAL_CAPSULE | Freq: Two times a day (BID) | ORAL | 0 refills | Status: DC

## 2018-08-19 MED ORDER — DIPHENHYDRAMINE HCL 50 MG/ML IJ SOLN
INTRAMUSCULAR | Status: AC
  Filled 2018-08-19: qty 1

## 2018-08-19 MED ORDER — DIPHENHYDRAMINE HCL 50 MG/ML IJ SOLN
25.0000 mg | Freq: Once | INTRAMUSCULAR | Status: AC
  Administered 2018-08-19: 25 mg via INTRAVENOUS

## 2018-08-19 MED ORDER — FAMOTIDINE IN NACL 20-0.9 MG/50ML-% IV SOLN
INTRAVENOUS | Status: AC
  Filled 2018-08-19: qty 50

## 2018-08-19 MED ORDER — FAMOTIDINE IN NACL 20-0.9 MG/50ML-% IV SOLN
20.0000 mg | Freq: Once | INTRAVENOUS | Status: AC
  Administered 2018-08-19: 20 mg via INTRAVENOUS

## 2018-08-19 MED ORDER — HEPARIN SOD (PORK) LOCK FLUSH 100 UNIT/ML IV SOLN
500.0000 [IU] | Freq: Once | INTRAVENOUS | Status: DC | PRN
  Filled 2018-08-19: qty 5

## 2018-08-19 MED ORDER — SODIUM CHLORIDE 0.9 % IV SOLN
20.0000 mg/m2 | Freq: Once | INTRAVENOUS | Status: AC
  Administered 2018-08-19: 33 mg via INTRAVENOUS
  Filled 2018-08-19: qty 3.3

## 2018-08-19 MED ORDER — DEXAMETHASONE SODIUM PHOSPHATE 10 MG/ML IJ SOLN
10.0000 mg | Freq: Once | INTRAMUSCULAR | Status: AC
  Administered 2018-08-19: 10 mg via INTRAVENOUS

## 2018-08-19 MED ORDER — DEXAMETHASONE SODIUM PHOSPHATE 10 MG/ML IJ SOLN
INTRAMUSCULAR | Status: AC
  Filled 2018-08-19: qty 1

## 2018-08-19 MED ORDER — SODIUM CHLORIDE 0.9% FLUSH
10.0000 mL | Freq: Once | INTRAVENOUS | Status: AC
  Administered 2018-08-19: 10 mL
  Filled 2018-08-19: qty 10

## 2018-08-19 NOTE — Patient Instructions (Signed)
[place] Discharge Instructions for Patients Receiving Chemotherapy  Today you received the following chemotherapy agents Jevtana  To help prevent nausea and vomiting after your treatment, we encourage you to take your nausea medication as needed If you develop nausea and vomiting that is not controlled by your nausea medication, call the clinic.   BELOW ARE SYMPTOMS THAT SHOULD BE REPORTED IMMEDIATELY:  *FEVER GREATER THAN 100.5 F  *CHILLS WITH OR WITHOUT FEVER  NAUSEA AND VOMITING THAT IS NOT CONTROLLED WITH YOUR NAUSEA MEDICATION  *UNUSUAL SHORTNESS OF BREATH  *UNUSUAL BRUISING OR BLEEDING  TENDERNESS IN MOUTH AND THROAT WITH OR WITHOUT PRESENCE OF ULCERS  *URINARY PROBLEMS  *BOWEL PROBLEMS  UNUSUAL RASH Items with * indicate a potential emergency and should be followed up as soon as possible.  Feel free to call the clinic should you have any questions or concerns. The clinic phone number is [phone].  Please show the [place] at check-in to the Emergency Department and triage nurse.   Cabazitaxel injection ?y l thu?c g? CABAZITAXEL l thu?c ha tr? li?u. N ???c dng ?? ?i?u tr? ung th? nhi?p h? tuy?n. N nh?m ??n cc t? bo phn chia nhanh, nh? cc t? bo ung th?, v lm cho cc t? bo ? ch?t. Thu?c ny c th? ???c dng cho nh?ng m?c ?ch khc; hy h?i ng??i cung c?p d?ch v? y t? ho?c d??c s? c?a mnh, n?u qu v? c th?c m?c. (CC) NHN HI?U PH? BI?N: Jevtana Ti c?n ph?i bo cho ng??i cung c?p d?ch v? y t? c?a mnh ?i?u g tr??c khi dng thu?c ny? H? c?n bi?t li?u qu v? c b?t k? tnh tr?ng no sau ?y khng: -ti?n s? ch?y mu bao t? -b?nh th?n -b?nh gan -s? l??ng t? bo mu th?p, ch?ng h?n nh? s? l??ng b?ch c?u, ti?u c?u, ho?c h?ng c?u th?p -b?nh ph?i ho??c h h?p, ch??ng ha?n nh? hen suy?n -m?i x? tr? g?n ?y ho?c ?ang x? tr? -dng cc thu?c ?? ?i?u tr? ho?c phng ng?a c?c mu ?ng -pha?n ??ng b?t th???ng ho??c di?  ??ng v??i cabazitaxel ho?c polysorbate 80 -pha?n ??ng b?t th???ng ho??c di? ??ng v??i ca?c d??c ph?m kha?c, th?c ph?m, thu?c nhu?m, ho??c ch?t ba?o qua?n -?ang c thai ho??c ??nh co? thai -?ang cho con bu? Ti nn s? d?ng thu?c ny nh? th? no? Thu?c ny ?? truy?n vo t?nh m?ch. Thu?c ny ???c s? d?ng b?i chuyn vin y t? ? b?nh vi?n ho?c ? phng m?ch. Hy bn v?i bc s? nhi khoa c?a qu v? v? vi?c dng thu?c ny ? tr? em. C th? c?n ch?m [place] ??c bi?t. Qu li?u: N?u qu v? cho r?ng mnh ? dng qu nhi?u thu?c ny, th hy lin l?c v?i trung tm ki?m sot ch?t ??c ho?c phng c?p c?u ngay l?p t?c. L?U : Thu?c ny ch? dnh ring cho qu v?. Khng chia s? thu?c ny v?i nh?ng ng??i khc. N?u ti l? qun m?t li?u th sao? ?i?u quan tr?ng l khng nn b? l? li?u thu?c no. Hy lin l?c v?i bc s? ho?c [place] vin y t? c?a mnh, n?u qu v? khng th? gi? ?ng cu?c h?n khm. Nh?ng g c th? t??ng tc v?i thu?c ny? -cc thu?c dng ?? tr? nhi?m HIV ho?c AIDS -clarithromycin -m?t s? thu?c dng ?? tr? cc b?nh nhi?m n?m, ch?ng h?n nh? fluconazole, itraconazole, ketoconazole, voriconazole -nefazodone -telithromycin Danh sch ny c th? khng m t? ?? h?t cc t??ng  tc c th? x?y ra. Hy ??a cho ng??i cung c?p d?ch v? y t? c?a mnh danh sch t?t c? cc thu?c, th?o d??c, cc thu?c khng c?n toa, ho?c cc ch? ph?m b? sung m qu v? dng. C?ng nn bo cho h? bi?t r?ng qu v? c ht thu?c, u?ng r??u, ho?c c s? d?ng ma ty tri php hay khng. Vi th? c th? t??ng tc v?i thu?c c?a qu v?. Ti c?n ph?i theo di ?i?u g trong khi dng thu?c ny? Qu v? s? ???c theo di ch?t ch? trong khi dng thu?c ny. Thu?c ny c th? lm cho qu v? c?m th?y khng ???c kh?e nh? th??ng l?. ?i?u ny khng ph?i khng ph? bi?n, b?i v thu?c ha tr? li?u c th? ?nh h??ng ??n c? t? bo lnh l?n t? bo ung th?. Hy t??ng trnh m?i tc d?ng ph?. Hy ti?p t?c ??t ?i?u tr? c?a mnh ngay c? khi qu v? c?m th?y m?t, tr? khi bc s? yu  c?u qu v? ng?ng ?i?u tr?. Hy h?i  ki?n bc s? ho?c chuyn vin y t?, n?u qu v? b? s?t, ?n l?nh ho?c ?au h?ng, ho?c c cc tri?u ch?ng khc c?a c?m l?nh ho?c cm. Khng ???c t? ?i?u tr? cho mnh. Thu?c ny c th? lm gi?m kh? n?ng ch?ng l?i cc b?nh nhi?m trng c?a c? th?. Hy c? trnh ? g?n nh?ng ng??i b? b?nh. Thu?c ny c th? lm t?ng nguy c? b? b?m tm ho?c ch?y mu. Hy lin l?c v?i bc s? ho?c chuyn vin y t?, n?u qu v? th?y ch?y mu b?t th??ng. Hy c?n th?n khi ?nh r?ng ho?c x?a r?ng b?ng ch? nha khoa ho?c b?ng t?m, b?i v qu v? c th? d? b? nhi?m trng ho?c d? b? ch?y mu h?n. N?u qu v? c ?i lm r?ng, th hy bo v?i nha s? r?ng qu v? ?ang dng thu?c ny. Trnh dng cc thu?c c ch?a aspirin, acetaminophen, ibuprofen, naproxen, ho?c ketoprofen, tr? khi ? ???c bc s? ch? d?n. Cc thu?c ny c th? che l?p tri?u ch?ng s?t. Khng ???c ?? c thai trong khi dng thu?c ny. Ph? n? c?n ph?i thng bo cho bc s? c?a mnh, n?u mu?n c thai ho?c ngh? r?ng c th? mnh ? c [place]. Nam gi??i khng nn gy thu? thai trong khi du?ng thu?c na?y va? trong vng 3 tha?ng sau khi ng?ng thu?c. C nguy c? v? cc tc d?ng ph? nghim tr?ng ??i v?i [place] nhi. Hy th?o lu?n v?i bc s? ho?c chuyn vin y t? ho?c d??c s? ?? bi?t thm thng tin. Khng ???c nui con b?ng s?a m? trong khi dng thu?c ny. Ti c th? nh?n th?y nh?ng tc d?ng ph? no khi dng thu?c ny? Nh?ng tc d?ng ph? qu v? c?n ph?i bo cho bc s? ho?c chuyn vin y t? cng s?m cng t?t: -cc ph?n ?ng d? ?ng, ch?ng h?n nh? da b? m?n ??, ng?a, n?i my ?ay, s?ng ? m?t, mi, ho?c l??i -kh th? -to bn -tiu ch?y -?au, t ho?c c?m gic nh? b? ki?n b ? bn tay ho?c bn chn -?au b?ng d? d?i -cc d?u hi?u nhi?m trng - s?t ho?c ?n l?nh, ho, ?au h?ng, kh ?i ti?u ho?c ?i ti?u ?au -cc d?u hi?u v tri?u ch?ng t?n th??ng th?n, nh? kh ?i ti?u ho?c thay ??i l??ng n??c ti?u -cc d?u hi?u gi?m ti?u c?u ho?c xu?t huy?t - b?m tm, cc n?t l?m t?m ?? trn  da, phn  c mu ?en, mu h?c n, c mu trong n??c ti?u -cc d?u hi?u gi?m s? l??ng t? bo h?ng c?u - c?m th?y y?u ?t ho?c m?t m?i m?t cch b?t th??ng, cc c?n ng?t x?u, chong vng -i m?a Cc tc d?ng ph? khng c?n ph?i ch?m [place] y t? (hy bo cho bc s? ho?c chuyn vin y t?, n?u cc tc d?ng ph? ny ti?p di?n ho?c gy phi?n toi): -?au l?ng -th?y v? c?a cc mn ?n thay ??i -r?ng tc -?au ??u -m?t c?m gic ngon mi?ng -?au ? kh?p ho?c c? b?p -bu?n i -kh ch?u ? bao t? Danh sch ny c th? khng m t? ?? h?t cc tc d?ng ph? c th? x?y ra. Xin g?i t?i bc s? c?a mnh ?? ???c c? v?n chuyn mn v? cc tc d?ng ph?Sander Nephew v? c th? t??ng trnh cc tc d?ng ph? cho FDA theo s? [phone]. Ti nn c?t gi? thu?c c?a mnh ? ?u? Thu?c ny ???c s? d?ng b?i chuyn vin y t? ? b?nh vi?n ho?c ? phng m?ch. Qu v? s? khng ???c c?p thu?c ny ?? c?t gi? t?i nh. L?U : ?y l b?n tm t?t. N c th? khng bao hm t?t c? thng tin c th? c. N?u qu v? th?c m?c v? thu?c ny, xin trao ??i v?i bc s?, d??c s?, ho?c ng??i cung c?p d?ch v? y t? c?a mnh.  2018 [employer] (2017-01-07 00:00:00)

## 2018-08-19 NOTE — Progress Notes (Signed)
Hematology and Oncology Follow Up Visit  [name] Johnston [id number] 01/01/1951 67 y.o. 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  At that time he had a Gleason score of 8 PSA 40 and disease to the liver.  With documented disease metastasis to the liver.   Prior Therapy:  He is started [place] on 06/27/2013. He subsequently received definitive radiation therapy for a planned dose of 75 gray completed in November 2014 under the care of Dr. Tammi Klippel.   He developed castration resistant disease with biopsy-proven liver metastasis in May 2016.  Docetaxel 75 mg/m given every 3 weeks with Neulasta support. He is status post 10 cycles of treatment concluded on 11/21/2015. He had an excellent PSA response with PSA dropping from 222 to 0.17.   PSA started to rise again with PSA up to 8.9 in August 2017.  He is status post orchiectomy completed in February 2019.  Zytiga 1000 mg with prednisone since September 2017.  Discontinued in August 2019 secondary to disease progression.  Current therapy: Jevtana 20 mg meter squared every 3 weeks.  First dose was given on 07/29/2018.  Status post 1 cycle.  Interim History:  [name] Johnston is here for a follow-up visit accompanied by his interpreter and his daughter.  Since his last visit, the patient received his first cycle of Jevtana.  He reported having more fatigue and had some arthralgias.  He used Tylenol which was effective.  He also reports a decreased appetite and he has lost more weight.  He would like something for his appetite.  He denies fevers and chills.  Denies chest pain, shortness of breath, cough, hemoptysis.  He had mild nausea and used Compazine which was effective.  Denies vomiting, constipation, diarrhea. His performance status is stable without any deterioration.  He still able to attend to activities of daily living.  He does not  report any headaches, blurry vision or syncope.  He denies any dizziness. He does not report any palpitations, orthopnea or leg edema.  He does not report any cough, wheezing or hemoptysis.  Denies any shortness of breath or dyspnea on exertion.  He does not report any frequency urgency and no hematuria.  He does not report any pathological fractures or any bone pain. He does not report any lymphadenopathy or petechiae.  He denies any bleeding or clotting tendencies.  He does not report any skin rashes or lesions.   Remainder of his review of systems is negative.  Medications: I have reviewed the patient's current medications.   Current Outpatient Medications  Medication Sig Dispense Refill  . prochlorperazine (COMPAZINE) 10 MG tablet Take 1 tablet (10 mg total) by mouth every 6 (six) hours as needed for nausea or vomiting. 30 tablet 0  . tamsulosin (FLOMAX) 0.4 MG CAPS capsule Take 1 capsule by mouth daily.    Marland Kitchen dronabinol (MARINOL) 2.5 MG capsule Take 1 capsule (2.5 mg total) by mouth 2 (two) times daily before a meal. 60 capsule 0  . losartan (COZAAR) 25 MG tablet Take 1 tablet by mouth as needed.     No current facility-administered medications for this visit.    Facility-Administered Medications Ordered in Other Visits  Medication Dose Route Frequency Provider Last Rate Last Dose  . heparin lock flush 100 unit/mL  500 Units Intracatheter Once Wyatt Portela, MD      . heparin lock flush 100 unit/mL  500 Units Intracatheter  Once PRN Wyatt Portela, MD      . sodium chloride flush (NS) 0.9 % injection 10 mL  10 mL Intracatheter Once Wyatt Portela, MD      . sodium chloride flush (NS) 0.9 % injection 10 mL  10 mL Intracatheter PRN Wyatt Portela, MD         Allergies:  Allergies  Allergen Reactions  . Aspirin Hives    Past Medical History, Surgical history, Social history, and Family History were reviewed and updated.    Physical Exam:  Blood pressure 131/78, pulse 76,  temperature 98.3 F (36.8 C), temperature source Oral, resp. rate 17, height 5\' 2"  (1.575 m), weight 136 lb 4.8 oz (61.8 kg), SpO2 100 %.   ECOG: 1 General appearance: Alert, awake gentleman without distress. Head: Atraumatic without abnormalities. Oropharynx: Mucous membranes are moist and pink. Eyes: Sclera anicteric. Lymph nodes: No cervical, supraclavicular, inguinal or axillary lymphadenopathy.   Heart: Regular rate and rhythm.  S1 and S2 without leg edema. Lung: Clear in all lung fields without any rhonchi, wheezes or dullness to percussion. Abdomin: Soft, without any rebound or guarding.  No shifting dullness or ascites. Musculoskeletal: No joint deformity or effusion. Skin: No petechiae or rashes. Neurological: No deficits noted.  Lab Results: Lab Results  Component Value Date   WBC 4.6 08/19/2018   HGB 10.1 (L) 08/19/2018   HCT 31.3 (L) 08/19/2018   MCV 82.6 08/19/2018   PLT 256 08/19/2018     Chemistry      Component Value Date/Time   NA 141 08/19/2018 0819   NA 142 12/17/2017 1303   K 3.9 08/19/2018 0819   K 3.2 (L) 12/17/2017 1303   CL 107 08/19/2018 0819   CO2 25 08/19/2018 0819   CO2 28 12/17/2017 1303   BUN 16 08/19/2018 0819   BUN 14.0 12/17/2017 1303   CREATININE 0.91 08/19/2018 0819   CREATININE 1.1 12/17/2017 1303      Component Value Date/Time   CALCIUM 9.5 08/19/2018 0819   CALCIUM 9.0 12/17/2017 1303   ALKPHOS 92 08/19/2018 0819   ALKPHOS 85 12/17/2017 1303   AST 15 08/19/2018 0819   AST 12 12/17/2017 1303   ALT 11 08/19/2018 0819   ALT 15 12/17/2017 1303   BILITOT 0.4 08/19/2018 0819   BILITOT 0.41 12/17/2017 1303      Results for DEUNTAE, KOCSIS (MRN [id number]) as of 08/19/2018 14:32  Ref. Range 02/09/2018 09:48 03/18/2018 15:04 05/26/2018 09:18 07/15/2018 08:51 07/29/2018 07:34  Prostate Specific Ag, Serum Latest Ref Range: 0.0 - 4.0 ng/mL 3.7 4.5 (H) 6.0 (H) 12.5 (H) 11.7 (H)     EXAM: CT ABDOMEN AND PELVIS WITH  CONTRAST  TECHNIQUE: Multidetector CT imaging of the abdomen and pelvis was performed using the standard protocol following bolus administration of intravenous contrast.  CONTRAST:  133mL ISOVUE-300 IOPAMIDOL (ISOVUE-300) INJECTION 61%  COMPARISON:  12/17/2017 CT abdomen/pelvis.  FINDINGS: Lower chest: There are several subpleural pulmonary nodules scattered in far basilar right lower lobe, all increased in size since 12/17/2017 CT abdomen study. For example a 0.8 cm right lower lobe nodule (series 4/image 13), increased from 0.6 cm. A 1.6 x 0.5 cm right lower lobe nodule (series 4/image 28), increased from 0.7 x 0.3 cm. A 0.8 cm right lower lobe nodule (series 4/image 24), increased from 0.2 cm. Superior approach central venous catheter is seen terminating in the right atrium.  Hepatobiliary: There is a new 2.3 x 2.1 cm hypodense segment 5 right liver  lobe mass (series 2/image 19). No additional new liver lesions. Segment 7 right liver lobe 1 5 x 0.7 cm hypodense lesion (series 2/image 10), previously 1.4 x 0.8 cm, not appreciably changed. Subcentimeter segment 4A left liver lobe lesion is too small to characterize and stable. Normal gallbladder with no radiopaque cholelithiasis. No biliary ductal dilatation.  Pancreas: Normal, with no mass or duct dilation.  Spleen: Normal size. No mass.  Adrenals/Urinary Tract: Normal adrenals. Left nephroureteral stent is well positioned with the proximal pigtail portion in the left renal pelvis and the distal pigtail portion in the right bladder. There is moderate left hydroureteronephrosis to the level of mid left ureter, worsened since 12/17/2017 CT. Periureteric soft tissue encasing pelvic segment of the left ureter measures up to 1.1 x 0.8 cm (series 2/image 67), previously 1.1 x 0.8 cm using similar measurement technique, stable. There is a delayed left contrast nephrogram. Simple 1.1 cm posterior lower left renal cyst. No  right hydronephrosis. Normal bladder.  Stomach/Bowel: Normal non-distended stomach. Normal caliber small bowel with no small bowel wall thickening. Normal appendix. Normal large bowel with no diverticulosis, large bowel wall thickening or pericolonic fat stranding.  Vascular/Lymphatic: Atherosclerotic nonaneurysmal abdominal aorta. Patent portal, splenic, hepatic and renal veins. No pathologically enlarged lymph nodes in the abdomen or pelvis.  Reproductive: Stable atrophic prostate with surrounding fiducial markers.  Other: No pneumoperitoneum, ascites or focal fluid collection.  Musculoskeletal: No aggressive appearing focal osseous lesions. Moderate thoracolumbar spondylosis.  IMPRESSION: 1. New hypodense 2.4 cm metastasis in the segment 5 right liver lobe. 2. Several solid pulmonary nodules at the right lung base have increased in size, worrisome for growing pulmonary metastases. 3. Infiltrative soft tissue encasing the pelvic segment of the left ureter is stable in size. Despite appropriate positioning of the left nephroureteral stent, there is worsened moderate left hydroureteronephrosis with delayed left contrast nephrogram, suggesting stent dysfunction/persistent distal left ureteral obstruction. 4.  Aortic Atherosclerosis (ICD10-I70.0).    Impression and Plan:  67 year old man with:  1.  Castration-resistant prostate cancer diagnosed in 2014.  Status post therapies outlined above and has developed castration resistant disease in 2016.  His PSA has started to rise and imaging studies obtained on 07/18/2018 showed recurrence of hers hepatic lesions and worsening pulmonary metastasis. The patient was switched from Zytiga to Jevtana 20 mg meter squared.  He is status post 1 cycle of his treatment which he tolerated fairly well with the exception of fatigue, decreased appetite, and weight loss.  He also experienced arthralgias related to [facility]. Labs reviewed and  are adequate for treatment.  Recommend he proceed with cycle 2 of his treatment today as scheduled.  I advised him to use Tylenol on an as-needed basis if he develops arthralgias. The patient's daughter was not at his last visit and inquired about the scan results.  These were reviewed with her in detail today.    2.  Hepatic metastasis: CT scan obtained in July 2019 showed worsening hepatic metastasis that has been documented in the past to be prostate cancer.  3.  IV access: Port-A-Cath will be be utilized for systemic chemotherapy.  4. Hypokalemia: Potassium is normal today.  5. Prognosis: His disease remains incurable but treatable at this time.  His performance status is excellent and aggressive therapy is warranted.  6.  Mild hydronephrosis:   His stent was replaced recently by Dr. Jeffie Pollock.  Creatinine remains stable.  7.  Androgen depravation: Testosterone remains at castrate level after orchiectomy.  8.  Hypertension:  Blood pressure is normal today.  This has improved since discontinuing Zytiga.  9.  Antiemetics: The patient has Compazine available to him.  10.  Decreased appetite and weight loss: I have advised the patient to drink at least 2 cans of boost or Ensure per day.  I have made a referral to our dietitian.  The patient would like medication to stimulate his appetite.  I have given her a prescription for Marinol 2.5 mg twice a day.  Adverse effect of this medication were discussed with the patient and his family member.  11. Follow-up: Will be in 3 weeks for evaluation prior to cycle #3 of his treatment.   Mikey Bussing, DNP, AGPCNP-BC, AOCNP 8/30/20192:24 PM

## 2018-08-20 ENCOUNTER — Inpatient Hospital Stay: Payer: PPO

## 2018-08-20 VITALS — BP 133/79 | HR 92 | Temp 97.7°F | Resp 18

## 2018-08-20 DIAGNOSIS — C61 Malignant neoplasm of prostate: Secondary | ICD-10-CM

## 2018-08-20 DIAGNOSIS — Z5111 Encounter for antineoplastic chemotherapy: Secondary | ICD-10-CM | POA: Diagnosis not present

## 2018-08-20 LAB — PROSTATE-SPECIFIC AG, SERUM (LABCORP): PROSTATE SPECIFIC AG, SERUM: 9.6 ng/mL — AB (ref 0.0–4.0)

## 2018-08-20 MED ORDER — PEGFILGRASTIM-CBQV 6 MG/0.6ML ~~LOC~~ SOSY
PREFILLED_SYRINGE | SUBCUTANEOUS | Status: AC
  Filled 2018-08-20: qty 0.6

## 2018-08-20 MED ORDER — PEGFILGRASTIM-CBQV 6 MG/0.6ML ~~LOC~~ SOSY
6.0000 mg | PREFILLED_SYRINGE | Freq: Once | SUBCUTANEOUS | Status: AC
  Administered 2018-08-20: 6 mg via SUBCUTANEOUS

## 2018-08-31 DIAGNOSIS — I10 Essential (primary) hypertension: Secondary | ICD-10-CM | POA: Diagnosis not present

## 2018-08-31 DIAGNOSIS — R5383 Other fatigue: Secondary | ICD-10-CM | POA: Diagnosis not present

## 2018-08-31 NOTE — Progress Notes (Signed)
PA for Dronabinol 2.5 mg has been submitted. 

## 2018-09-02 DIAGNOSIS — I10 Essential (primary) hypertension: Secondary | ICD-10-CM | POA: Diagnosis not present

## 2018-09-02 DIAGNOSIS — C61 Malignant neoplasm of prostate: Secondary | ICD-10-CM | POA: Diagnosis not present

## 2018-09-02 DIAGNOSIS — Z23 Encounter for immunization: Secondary | ICD-10-CM | POA: Diagnosis not present

## 2018-09-02 DIAGNOSIS — C7800 Secondary malignant neoplasm of unspecified lung: Secondary | ICD-10-CM | POA: Diagnosis not present

## 2018-09-02 DIAGNOSIS — Z6826 Body mass index (BMI) 26.0-26.9, adult: Secondary | ICD-10-CM | POA: Diagnosis not present

## 2018-09-02 DIAGNOSIS — R160 Hepatomegaly, not elsewhere classified: Secondary | ICD-10-CM | POA: Diagnosis not present

## 2018-09-09 ENCOUNTER — Inpatient Hospital Stay: Payer: PPO

## 2018-09-09 ENCOUNTER — Inpatient Hospital Stay: Payer: PPO | Admitting: Nutrition

## 2018-09-09 ENCOUNTER — Inpatient Hospital Stay: Payer: PPO | Attending: Oncology | Admitting: Oncology

## 2018-09-09 VITALS — BP 127/78 | HR 78 | Temp 98.4°F | Resp 17 | Ht 62.0 in | Wt 136.6 lb

## 2018-09-09 DIAGNOSIS — Z5111 Encounter for antineoplastic chemotherapy: Secondary | ICD-10-CM | POA: Diagnosis not present

## 2018-09-09 DIAGNOSIS — R630 Anorexia: Secondary | ICD-10-CM

## 2018-09-09 DIAGNOSIS — C61 Malignant neoplasm of prostate: Secondary | ICD-10-CM

## 2018-09-09 DIAGNOSIS — Z5189 Encounter for other specified aftercare: Secondary | ICD-10-CM | POA: Diagnosis not present

## 2018-09-09 DIAGNOSIS — E876 Hypokalemia: Secondary | ICD-10-CM

## 2018-09-09 DIAGNOSIS — I700 Atherosclerosis of aorta: Secondary | ICD-10-CM | POA: Diagnosis not present

## 2018-09-09 DIAGNOSIS — Z95828 Presence of other vascular implants and grafts: Secondary | ICD-10-CM

## 2018-09-09 DIAGNOSIS — C787 Secondary malignant neoplasm of liver and intrahepatic bile duct: Secondary | ICD-10-CM | POA: Diagnosis not present

## 2018-09-09 DIAGNOSIS — Z79899 Other long term (current) drug therapy: Secondary | ICD-10-CM | POA: Diagnosis not present

## 2018-09-09 LAB — CBC WITH DIFFERENTIAL (CANCER CENTER ONLY)
BASOS ABS: 0.1 10*3/uL (ref 0.0–0.1)
Basophils Relative: 2 %
Eosinophils Absolute: 0.1 10*3/uL (ref 0.0–0.5)
Eosinophils Relative: 2 %
HEMATOCRIT: 32 % — AB (ref 38.4–49.9)
Hemoglobin: 10.5 g/dL — ABNORMAL LOW (ref 13.0–17.1)
Lymphocytes Relative: 21 %
Lymphs Abs: 0.8 10*3/uL — ABNORMAL LOW (ref 0.9–3.3)
MCH: 26.7 pg — ABNORMAL LOW (ref 27.2–33.4)
MCHC: 32.7 g/dL (ref 32.0–36.0)
MCV: 81.5 fL (ref 79.3–98.0)
MONO ABS: 0.5 10*3/uL (ref 0.1–0.9)
Monocytes Relative: 12 %
NEUTROS ABS: 2.4 10*3/uL (ref 1.5–6.5)
Neutrophils Relative %: 63 %
Platelet Count: 180 10*3/uL (ref 140–400)
RBC: 3.93 MIL/uL — AB (ref 4.20–5.82)
RDW: 14.8 % — AB (ref 11.0–14.6)
WBC: 3.9 10*3/uL — AB (ref 4.0–10.3)

## 2018-09-09 LAB — CMP (CANCER CENTER ONLY)
ALK PHOS: 109 U/L (ref 38–126)
ALT: 14 U/L (ref 0–44)
ANION GAP: 12 (ref 5–15)
AST: 17 U/L (ref 15–41)
Albumin: 3.7 g/dL (ref 3.5–5.0)
BILIRUBIN TOTAL: 0.7 mg/dL (ref 0.3–1.2)
BUN: 11 mg/dL (ref 8–23)
CO2: 23 mmol/L (ref 22–32)
Calcium: 9.7 mg/dL (ref 8.9–10.3)
Chloride: 107 mmol/L (ref 98–111)
Creatinine: 0.85 mg/dL (ref 0.61–1.24)
GFR, Est AFR Am: >60 mL/min (ref >60)
GLUCOSE: 101 mg/dL — AB (ref 70–99)
Potassium: 3.6 mmol/L (ref 3.5–5.1)
Sodium: 142 mmol/L (ref 135–145)
TOTAL PROTEIN: 7.3 g/dL (ref 6.5–8.1)

## 2018-09-09 MED ORDER — SODIUM CHLORIDE 0.9% FLUSH
10.0000 mL | Freq: Once | INTRAVENOUS | Status: AC
  Administered 2018-09-09: 10 mL
  Filled 2018-09-09: qty 10

## 2018-09-09 MED ORDER — DEXAMETHASONE SODIUM PHOSPHATE 10 MG/ML IJ SOLN
10.0000 mg | Freq: Once | INTRAMUSCULAR | Status: AC
  Administered 2018-09-09: 10 mg via INTRAVENOUS

## 2018-09-09 MED ORDER — FAMOTIDINE IN NACL 20-0.9 MG/50ML-% IV SOLN
20.0000 mg | Freq: Once | INTRAVENOUS | Status: AC
  Administered 2018-09-09: 20 mg via INTRAVENOUS

## 2018-09-09 MED ORDER — ALTEPLASE 2 MG IJ SOLR
2.0000 mg | Freq: Once | INTRAMUSCULAR | Status: AC
  Administered 2018-09-09: 2 mg
  Filled 2018-09-09: qty 2

## 2018-09-09 MED ORDER — HEPARIN SOD (PORK) LOCK FLUSH 100 UNIT/ML IV SOLN
500.0000 [IU] | Freq: Once | INTRAVENOUS | Status: AC | PRN
  Administered 2018-09-09: 500 [IU]
  Filled 2018-09-09: qty 5

## 2018-09-09 MED ORDER — DIPHENHYDRAMINE HCL 50 MG/ML IJ SOLN
25.0000 mg | Freq: Once | INTRAMUSCULAR | Status: AC
  Administered 2018-09-09: 25 mg via INTRAVENOUS

## 2018-09-09 MED ORDER — MEGESTROL ACETATE 400 MG/10ML PO SUSP: 400.0000 mg | Freq: Two times a day (BID) | ORAL | 0 refills | Status: DC

## 2018-09-09 MED ORDER — DEXTROSE 5 % IV SOLN
20.0000 mg/m2 | Freq: Once | INTRAVENOUS | Status: DC
  Filled 2018-09-09: qty 3.3

## 2018-09-09 MED ORDER — SODIUM CHLORIDE 0.9 % IV SOLN
Freq: Once | INTRAVENOUS | Status: AC
  Administered 2018-09-09: 10:00:00 via INTRAVENOUS
  Filled 2018-09-09: qty 250

## 2018-09-09 MED ORDER — SODIUM CHLORIDE 0.9 % IV SOLN
20.0000 mg/m2 | Freq: Once | INTRAVENOUS | Status: AC
  Administered 2018-09-09: 33 mg via INTRAVENOUS
  Filled 2018-09-09: qty 3.3

## 2018-09-09 MED ORDER — ALTEPLASE 2 MG IJ SOLR
INTRAMUSCULAR | Status: AC
  Filled 2018-09-09: qty 2

## 2018-09-09 MED ORDER — FAMOTIDINE IN NACL 20-0.9 MG/50ML-% IV SOLN
INTRAVENOUS | Status: AC
  Filled 2018-09-09: qty 50

## 2018-09-09 MED ORDER — SODIUM CHLORIDE 0.9% FLUSH
10.0000 mL | INTRAVENOUS | Status: DC | PRN
  Administered 2018-09-09: 10 mL
  Filled 2018-09-09: qty 10

## 2018-09-09 MED ORDER — DIPHENHYDRAMINE HCL 50 MG/ML IJ SOLN
INTRAMUSCULAR | Status: AC
  Filled 2018-09-09: qty 1

## 2018-09-09 MED ORDER — DEXAMETHASONE SODIUM PHOSPHATE 10 MG/ML IJ SOLN
INTRAMUSCULAR | Status: AC
  Filled 2018-09-09: qty 1

## 2018-09-09 NOTE — Progress Notes (Signed)
No blood return from port after cathflo in for 1.5 hours.  Pt agrees to try PIV at this time in order to start treatment.

## 2018-09-09 NOTE — Progress Notes (Signed)
67 year old male diagnosed with metastatic prostate cancer.  He has an interpreter with him today.  He is a patient of Dr. Alen Blew.  Past medical history includes hypertension, hyperlipidemia, and GERD.  Medications include Marinol.  Labs include glucose 103 on August 30.  Height: 5 feet 2 inches. Weight: 136.3 pounds. Usual body weight: 142 pounds in January. BMI: 24.93.  Patient reports his appetite is poor He does endorse about a 6 pound weight loss. He denies other nutrition impact symptoms. Reports he is trying to drink two oral nutrition supplements daily.  Nutrition diagnosis:  Unintended weight loss related to metastatic prostate cancer and associated treatments as evidenced by 6 pound weight loss over 9 months.  Intervention: Patient educated to consume increased calories and protein in 6 small meals and snacks daily. Recommended patient increase oral nutrition supplements to Ensure Plus, Ensure Enlive, or boost plus. Provided complementary case of Ensure Enlive as well as coupons. Provided fact sheets.  Contact information provided.  Questions were answered.  Teach back method used.  Monitoring, evaluation, goals: Patient will tolerate increased calories and protein to minimize further weight loss.  Next visit: Patient will contact me with questions or concerns.  **Disclaimer: This note was dictated with voice recognition software. Similar sounding words can inadvertently be transcribed and this note may contain transcription errors which may not have been corrected upon publication of note.**

## 2018-09-09 NOTE — Progress Notes (Signed)
Hematology and Oncology Follow Up Visit  [name] Johnston [id number] [birth year] 67 y.o. 09/09/2018 8:39 AM [name], MDDewey, [name], MD   Principle Diagnosis: 67 year old man with advanced prostate cancer with initial diagnosis in 2014.  At that time he had a Gleason score 8 and a PSA 40 and subsequently developed castration-resistant disease with involvement in the lung and the liver.  Prior Therapy:  He is started [place] on 06/27/2013. He subsequently received definitive radiation therapy for a planned dose of 75 gray completed in November 2014 under the care of Dr. Tammi Klippel.   He developed castration resistant disease with biopsy-proven liver metastasis in May 2016.  Docetaxel 75 mg/m given every 3 weeks with Neulasta support. He is status post 10 cycles of treatment concluded on 11/21/2015. He had an excellent PSA response with PSA dropping from 222 to 0.17.   PSA started to rise again with PSA up to 8.9 in August 2017.  He is status post orchiectomy completed in February 2019.  Zytiga 1000 mg with prednisone since September 2017.    Current therapy: Jevtana chemotherapy given at 20 mg/m every 3 weeks cycle 1 started on 07/29/2018.  He is here for cycle 3 of therapy.  Interim History:  Mr. Herrod presents today for a follow-up visit with an interpreter.  Since the last visit, he received 2 cycles of Jevtana chemotherapy without any major complications.  He does report some mild fatigue and anorexia but overall complications have been manageable.  He denies any nausea, vomiting or diarrhea.  He denies any change in his bowel habits.  Although his appetite has been poor his weight remained stable.  He denies any infusion related complications.  His performance status has declined slightly but still active and attends to activities of daily living.  He does not report any headaches, blurry vision or syncope.  He denies any alteration in mental status or mental lethargy.  He does not  report any fevers or chills.  He does not report any chest pain, palpitation, orthopnea or leg edema.  He does not report any cough, wheezing or hemoptysis.  Denies any shortness of breath or dyspnea on exertion.  He does not report any flank pain, hematuria or dysuria.  He does not report any arthralgias or myalgias.  He does not report any lymphadenopathy or petechiae.  He denies any skin rashes or lesions.  He does not report any changes in his mood.  He denies any easy bruising.  Remainder of his review of systems is negative.  Medications: I have reviewed the patient's current medications.   Current Outpatient Medications  Medication Sig Dispense Refill  . dronabinol (MARINOL) 2.5 MG capsule Take 1 capsule (2.5 mg total) by mouth 2 (two) times daily before a meal. 60 capsule 0  . losartan (COZAAR) 25 MG tablet Take 1 tablet by mouth as needed.    . megestrol (MEGACE) 400 MG/10ML suspension Take 10 mLs (400 mg total) by mouth 2 (two) times daily. 240 mL 0  . prochlorperazine (COMPAZINE) 10 MG tablet Take 1 tablet (10 mg total) by mouth every 6 (six) hours as needed for nausea or vomiting. 30 tablet 0  . tamsulosin (FLOMAX) 0.4 MG CAPS capsule Take 1 capsule by mouth daily.     No current facility-administered medications for this visit.    Facility-Administered Medications Ordered in Other Visits  Medication Dose Route Frequency Provider Last Rate Last Dose  . heparin lock flush 100 unit/mL  500 Units Intracatheter Once  Wyatt Portela, MD      . sodium chloride flush (NS) 0.9 % injection 10 mL  10 mL Intracatheter Once Wyatt Portela, MD         Allergies:  Allergies  Allergen Reactions  . Aspirin Hives    Past Medical History, Surgical history, Social history, and Family History were reviewed and updated.    Physical Exam:  Blood pressure 127/78, pulse 78, temperature 98.4 F (36.9 C), temperature source Oral, resp. rate 17, height 5\' 2"  (1.575 m), weight 136 lb 9.6 oz (62  kg), SpO2 99 %.   ECOG: 1    General appearance: Comfortable appearing without any discomfort Head: Normocephalic without any trauma Oropharynx: Mucous membranes are moist and pink without any thrush or ulcers. Eyes: Pupils are equal and round reactive to light. Lymph nodes: No cervical, supraclavicular, inguinal or axillary lymphadenopathy.   Heart:regular rate and rhythm.  S1 and S2 without leg edema. Lung: Clear without any rhonchi or wheezes.  No dullness to percussion. Abdomin: Soft, nontender, nondistended with good bowel sounds.  No hepatosplenomegaly. Musculoskeletal: No joint deformity or effusion.  Full range of motion noted. Neurological: No deficits noted on motor, sensory and deep tendon reflex exam. Skin: No petechial rash or dryness.  Port-A-Cath site appeared clean dry without any erythema or induration.   Lab Results: Lab Results  Component Value Date   WBC 3.9 (L) 09/09/2018   HGB 10.5 (L) 09/09/2018   HCT 32.0 (L) 09/09/2018   MCV 81.5 09/09/2018   PLT 180 09/09/2018     Chemistry      Component Value Date/Time   NA 141 08/19/2018 0819   NA 142 12/17/2017 1303   K 3.9 08/19/2018 0819   K 3.2 (L) 12/17/2017 1303   CL 107 08/19/2018 0819   CO2 25 08/19/2018 0819   CO2 28 12/17/2017 1303   BUN 16 08/19/2018 0819   BUN 14.0 12/17/2017 1303   CREATININE 0.91 08/19/2018 0819   CREATININE 1.1 12/17/2017 1303      Component Value Date/Time   CALCIUM 9.5 08/19/2018 0819   CALCIUM 9.0 12/17/2017 1303   ALKPHOS 92 08/19/2018 0819   ALKPHOS 85 12/17/2017 1303   AST 15 08/19/2018 0819   AST 12 12/17/2017 1303   ALT 11 08/19/2018 0819   ALT 15 12/17/2017 1303   BILITOT 0.4 08/19/2018 0819   BILITOT 0.41 12/17/2017 1303      Results for SOURISH, ALLENDER (MRN [id number]) as of 09/09/2018 08:26  Ref. Range 07/29/2018 07:34 08/19/2018 08:19  Prostate Specific Ag, Serum Latest Ref Range: 0.0 - 4.0 ng/mL 11.7 (H) 9.6 (H)      IMPRESSION: 1. New hypodense 2.4 cm  metastasis in the segment 5 right liver lobe. 2. Several solid pulmonary nodules at the right lung base have increased in size, worrisome for growing pulmonary metastases. 3. Infiltrative soft tissue encasing the pelvic segment of the left ureter is stable in size. Despite appropriate positioning of the left nephroureteral stent, there is worsened moderate left hydroureteronephrosis with delayed left contrast nephrogram, suggesting stent dysfunction/persistent distal left ureteral obstruction. 4.  Aortic Atherosclerosis (ICD10-I70.0).    Impression and Plan:  67 year old man with:  1.  Advanced prostate cancer with metastatic disease to the liver that is currently castration-resistant.  He is currently receiving Jevtana chemotherapy and has received 2 cycles without any major complications.  The natural course of this disease as well as imaging studies from 07/18/2018 were reviewed again.  He has tolerated  chemotherapy reasonably well with decline in his PSA so far.  He has few predictable side effects that have been reasonably manageable at this time.  Risks and benefits of continuing chemotherapy was reviewed today is agreeable to continue.  The plan is to treat him for 6-10 cycles depending on his tolerance.   2.  Hepatic metastasis: These has been documented to be prostate cancer lesions and correlate with PSA.  His PSA is declining indicate likely regression of his hepatic metastasis.  We will repeat imaging studies in the future to document that.  3.  IV access: Port-A-Cath remains in use without any issues.  There is some blood return issues reported today.  4. Hypokalemia: This is resolved at this time since discontinuation of Zytiga.  5. Prognosis: Treatment is palliative at this time although his performance status remain excellent and aggressive therapy is warranted.  6.  Mild hydronephrosis:   Related to his prostate cancer malignancy although his kidney function continues  to be normal.  7.  Androgen depravation: He is status post orchiectomy and does not require any androgen deprivation at this time.  8.  Anorexia: Prescription for Megace was given to him today with instruction how to use it.  9.  Antiemetics: No nausea or vomiting reported at this time.  He has Compazine available to him in case he needs it.  10. Follow-up: Will be in in 3 weeks for his next cycle of chemotherapy.  His 6-week chemotherapy cycle will also be scheduled today.  25  minutes was spent with the patient face-to-face today.  More than 50% of time was dedicated to reviewing imaging studies, treatment for his disease as well as managing complications related to this therapy.    Zola Button, MD 9/20/20198:39 AM

## 2018-09-09 NOTE — Patient Instructions (Signed)
[place] Discharge Instructions for Patients Receiving Chemotherapy  Today you received the following chemotherapy agents Jevtana  To help prevent nausea and vomiting after your treatment, we encourage you to take your nausea medication as needed If you develop nausea and vomiting that is not controlled by your nausea medication, call the clinic.   BELOW ARE SYMPTOMS THAT SHOULD BE REPORTED IMMEDIATELY:  *FEVER GREATER THAN 100.5 F  *CHILLS WITH OR WITHOUT FEVER  NAUSEA AND VOMITING THAT IS NOT CONTROLLED WITH YOUR NAUSEA MEDICATION  *UNUSUAL SHORTNESS OF BREATH  *UNUSUAL BRUISING OR BLEEDING  TENDERNESS IN MOUTH AND THROAT WITH OR WITHOUT PRESENCE OF ULCERS  *URINARY PROBLEMS  *BOWEL PROBLEMS  UNUSUAL RASH Items with * indicate a potential emergency and should be followed up as soon as possible.  Feel free to call the clinic should you have any questions or concerns. The clinic phone number is [phone].  Please show the [place] at check-in to the Emergency Department and triage nurse.   Cabazitaxel injection ?y l thu?c g? CABAZITAXEL l thu?c ha tr? li?u. N ???c dng ?? ?i?u tr? ung th? nhi?p h? tuy?n. N nh?m ??n cc t? bo phn chia nhanh, nh? cc t? bo ung th?, v lm cho cc t? bo ? ch?t. Thu?c ny c th? ???c dng cho nh?ng m?c ?ch khc; hy h?i ng??i cung c?p d?ch v? y t? ho?c d??c s? c?a mnh, n?u qu v? c th?c m?c. (CC) NHN HI?U PH? BI?N: Jevtana Ti c?n ph?i bo cho ng??i cung c?p d?ch v? y t? c?a mnh ?i?u g tr??c khi dng thu?c ny? H? c?n bi?t li?u qu v? c b?t k? tnh tr?ng no sau ?y khng: -ti?n s? ch?y mu bao t? -b?nh th?n -b?nh gan -s? l??ng t? bo mu th?p, ch?ng h?n nh? s? l??ng b?ch c?u, ti?u c?u, ho?c h?ng c?u th?p -b?nh ph?i ho??c h h?p, ch??ng ha?n nh? hen suy?n -m?i x? tr? g?n ?y ho?c ?ang x? tr? -dng cc thu?c ?? ?i?u tr? ho?c phng ng?a c?c mu ?ng -pha?n ??ng b?t th???ng ho??c di?  ??ng v??i cabazitaxel ho?c polysorbate 80 -pha?n ??ng b?t th???ng ho??c di? ??ng v??i ca?c d??c ph?m kha?c, th?c ph?m, thu?c nhu?m, ho??c ch?t ba?o qua?n -?ang c thai ho??c ??nh co? thai -?ang cho con bu? Ti nn s? d?ng thu?c ny nh? th? no? Thu?c ny ?? truy?n vo t?nh m?ch. Thu?c ny ???c s? d?ng b?i chuyn vin y t? ? b?nh vi?n ho?c ? phng m?ch. Hy bn v?i bc s? nhi khoa c?a qu v? v? vi?c dng thu?c ny ? tr? em. C th? c?n ch?m [place] ??c bi?t. Qu li?u: N?u qu v? cho r?ng mnh ? dng qu nhi?u thu?c ny, th hy lin l?c v?i trung tm ki?m sot ch?t ??c ho?c phng c?p c?u ngay l?p t?c. L?U : Thu?c ny ch? dnh ring cho qu v?. Khng chia s? thu?c ny v?i nh?ng ng??i khc. N?u ti l? qun m?t li?u th sao? ?i?u quan tr?ng l khng nn b? l? li?u thu?c no. Hy lin l?c v?i bc s? ho?c [place] vin y t? c?a mnh, n?u qu v? khng th? gi? ?ng cu?c h?n khm. Nh?ng g c th? t??ng tc v?i thu?c ny? -cc thu?c dng ?? tr? nhi?m HIV ho?c AIDS -clarithromycin -m?t s? thu?c dng ?? tr? cc b?nh nhi?m n?m, ch?ng h?n nh? fluconazole, itraconazole, ketoconazole, voriconazole -nefazodone -telithromycin [facility] sch ny c th? khng m t? ?? h?t cc t??ng  tc c th? x?y ra. Hy ??a cho ng??i cung c?p d?ch v? y t? c?a mnh danh sch t?t c? cc thu?c, th?o d??c, cc thu?c khng c?n toa, ho?c cc ch? ph?m b? sung m qu v? dng. C?ng nn bo cho h? bi?t r?ng qu v? c ht thu?c, u?ng r??u, ho?c c s? d?ng ma ty tri php hay khng. Vi th? c th? t??ng tc v?i thu?c c?a qu v?. Ti c?n ph?i theo di ?i?u g trong khi dng thu?c ny? Qu v? s? ???c theo di ch?t ch? trong khi dng thu?c ny. Thu?c ny c th? lm cho qu v? c?m th?y khng ???c kh?e nh? th??ng l?. ?i?u ny khng ph?i khng ph? bi?n, b?i v thu?c ha tr? li?u c th? ?nh h??ng ??n c? t? bo lnh l?n t? bo ung th?. Hy t??ng trnh m?i tc d?ng ph?. Hy ti?p t?c ??t ?i?u tr? c?a mnh ngay c? khi qu v? c?m th?y m?t, tr? khi bc s? yu  c?u qu v? ng?ng ?i?u tr?. Hy h?i  ki?n bc s? ho?c chuyn vin y t?, n?u qu v? b? s?t, ?n l?nh ho?c ?au h?ng, ho?c c cc tri?u ch?ng khc c?a c?m l?nh ho?c cm. Khng ???c t? ?i?u tr? cho mnh. Thu?c ny c th? lm gi?m kh? n?ng ch?ng l?i cc b?nh nhi?m trng c?a c? th?. Hy c? trnh ? g?n nh?ng ng??i b? b?nh. Thu?c ny c th? lm t?ng nguy c? b? b?m tm ho?c ch?y mu. Hy lin l?c v?i bc s? ho?c chuyn vin y t?, n?u qu v? th?y ch?y mu b?t th??ng. Hy c?n th?n khi ?nh r?ng ho?c x?a r?ng b?ng ch? nha khoa ho?c b?ng t?m, b?i v qu v? c th? d? b? nhi?m trng ho?c d? b? ch?y mu h?n. N?u qu v? c ?i lm r?ng, th hy bo v?i nha s? r?ng qu v? ?ang dng thu?c ny. Trnh dng cc thu?c c ch?a aspirin, acetaminophen, ibuprofen, naproxen, ho?c ketoprofen, tr? khi ? ???c bc s? ch? d?n. Cc thu?c ny c th? che l?p tri?u ch?ng s?t. Khng ???c ?? c thai trong khi dng thu?c ny. Ph? n? c?n ph?i thng bo cho bc s? c?a mnh, n?u mu?n c thai ho?c ngh? r?ng c th? mnh ? c [place]. Nam gi??i khng nn gy thu? thai trong khi du?ng thu?c na?y va? trong vng 3 tha?ng sau khi ng?ng thu?c. C nguy c? v? cc tc d?ng ph? nghim tr?ng ??i v?i [place] nhi. Hy th?o lu?n v?i bc s? ho?c chuyn vin y t? ho?c d??c s? ?? bi?t thm thng tin. Khng ???c nui con b?ng s?a m? trong khi dng thu?c ny. Ti c th? nh?n th?y nh?ng tc d?ng ph? no khi dng thu?c ny? Nh?ng tc d?ng ph? qu v? c?n ph?i bo cho bc s? ho?c chuyn vin y t? cng s?m cng t?t: -cc ph?n ?ng d? ?ng, ch?ng h?n nh? da b? m?n ??, ng?a, n?i my ?ay, s?ng ? m?t, mi, ho?c l??i -kh th? -to bn -tiu ch?y -?au, t ho?c c?m gic nh? b? ki?n b ? bn tay ho?c bn chn -?au b?ng d? d?i -cc d?u hi?u nhi?m trng - s?t ho?c ?n l?nh, ho, ?au h?ng, kh ?i ti?u ho?c ?i ti?u ?au -cc d?u hi?u v tri?u ch?ng t?n th??ng th?n, nh? kh ?i ti?u ho?c thay ??i l??ng n??c ti?u -cc d?u hi?u gi?m ti?u c?u ho?c xu?t huy?t - b?m tm, cc n?t l?m t?m ?? trn  da, phn  c mu ?en, mu h?c n, c mu trong n??c ti?u -cc d?u hi?u gi?m s? l??ng t? bo h?ng c?u - c?m th?y y?u ?t ho?c m?t m?i m?t cch b?t th??ng, cc c?n ng?t x?u, chong vng -i m?a Cc tc d?ng ph? khng c?n ph?i ch?m [place] y t? (hy bo cho bc s? ho?c chuyn vin y t?, n?u cc tc d?ng ph? ny ti?p di?n ho?c gy phi?n toi): -?au l?ng -th?y v? c?a cc mn ?n thay ??i -r?ng tc -?au ??u -m?t c?m gic ngon mi?ng -?au ? kh?p ho?c c? b?p -bu?n i -kh ch?u ? bao t? [facility] sch ny c th? khng m t? ?? h?t cc tc d?ng ph? c th? x?y ra. Xin g?i t?i bc s? c?a mnh ?? ???c c? v?n chuyn mn v? cc tc d?ng ph?Sander Nephew v? c th? t??ng trnh cc tc d?ng ph? cho FDA theo s? [phone]. Ti nn c?t gi? thu?c c?a mnh ? ?u? Thu?c ny ???c s? d?ng b?i chuyn vin y t? ? b?nh vi?n ho?c ? phng m?ch. Qu v? s? khng ???c c?p thu?c ny ?? c?t gi? t?i nh. L?U : ?y l b?n tm t?t. N c th? khng bao hm t?t c? thng tin c th? c. N?u qu v? th?c m?c v? thu?c ny, xin trao ??i v?i bc s?, d??c s?, ho?c ng??i cung c?p d?ch v? y t? c?a mnh.  2018 Elsevier/Gold Standard (2017-01-07 00:00:00)

## 2018-09-10 ENCOUNTER — Inpatient Hospital Stay: Payer: PPO

## 2018-09-10 VITALS — BP 138/75 | HR 87 | Temp 98.7°F | Resp 18

## 2018-09-10 DIAGNOSIS — Z5111 Encounter for antineoplastic chemotherapy: Secondary | ICD-10-CM | POA: Diagnosis not present

## 2018-09-10 DIAGNOSIS — C61 Malignant neoplasm of prostate: Secondary | ICD-10-CM

## 2018-09-10 LAB — PROSTATE-SPECIFIC AG, SERUM (LABCORP): Prostate Specific Ag, Serum: 3.3 ng/mL (ref 0.0–4.0)

## 2018-09-10 MED ORDER — PEGFILGRASTIM-CBQV 6 MG/0.6ML ~~LOC~~ SOSY
PREFILLED_SYRINGE | SUBCUTANEOUS | Status: AC
  Filled 2018-09-10: qty 0.6

## 2018-09-10 MED ORDER — PEGFILGRASTIM-CBQV 6 MG/0.6ML ~~LOC~~ SOSY
6.0000 mg | PREFILLED_SYRINGE | Freq: Once | SUBCUTANEOUS | Status: AC
  Administered 2018-09-10: 6 mg via SUBCUTANEOUS

## 2018-09-21 NOTE — Progress Notes (Signed)
PA for Megestrol Acetate 40 mg/ml has been submitted.

## 2018-09-30 ENCOUNTER — Inpatient Hospital Stay: Payer: PPO

## 2018-09-30 ENCOUNTER — Encounter: Payer: Self-pay | Admitting: Nurse Practitioner

## 2018-09-30 ENCOUNTER — Inpatient Hospital Stay: Payer: PPO | Attending: Oncology | Admitting: Nurse Practitioner

## 2018-09-30 ENCOUNTER — Telehealth: Payer: Self-pay | Admitting: Nurse Practitioner

## 2018-09-30 VITALS — BP 130/79 | HR 89 | Temp 98.1°F | Resp 18 | Ht 62.0 in | Wt 135.4 lb

## 2018-09-30 DIAGNOSIS — Z5111 Encounter for antineoplastic chemotherapy: Secondary | ICD-10-CM | POA: Diagnosis not present

## 2018-09-30 DIAGNOSIS — C61 Malignant neoplasm of prostate: Secondary | ICD-10-CM | POA: Insufficient documentation

## 2018-09-30 DIAGNOSIS — Z5189 Encounter for other specified aftercare: Secondary | ICD-10-CM | POA: Insufficient documentation

## 2018-09-30 DIAGNOSIS — C787 Secondary malignant neoplasm of liver and intrahepatic bile duct: Secondary | ICD-10-CM | POA: Diagnosis not present

## 2018-09-30 LAB — CMP (CANCER CENTER ONLY)
ALT: 23 U/L (ref 0–44)
AST: 21 U/L (ref 15–41)
Albumin: 3.6 g/dL (ref 3.5–5.0)
Alkaline Phosphatase: 101 U/L (ref 38–126)
Anion gap: 11 (ref 5–15)
BUN: 9 mg/dL (ref 8–23)
CHLORIDE: 106 mmol/L (ref 98–111)
CO2: 25 mmol/L (ref 22–32)
Calcium: 9.3 mg/dL (ref 8.9–10.3)
Creatinine: 0.8 mg/dL (ref 0.61–1.24)
Glucose, Bld: 106 mg/dL — ABNORMAL HIGH (ref 70–99)
POTASSIUM: 3.5 mmol/L (ref 3.5–5.1)
Sodium: 142 mmol/L (ref 135–145)
Total Bilirubin: 0.7 mg/dL (ref 0.3–1.2)
Total Protein: 7.2 g/dL (ref 6.5–8.1)

## 2018-09-30 LAB — CBC WITH DIFFERENTIAL (CANCER CENTER ONLY)
Abs Immature Granulocytes: 0.01 10*3/uL (ref 0.00–0.07)
BASOS ABS: 0.1 10*3/uL (ref 0.0–0.1)
BASOS PCT: 1 %
Eosinophils Absolute: 0 10*3/uL (ref 0.0–0.5)
Eosinophils Relative: 1 %
HCT: 31.6 % — ABNORMAL LOW (ref 39.0–52.0)
HEMOGLOBIN: 10.1 g/dL — AB (ref 13.0–17.0)
Immature Granulocytes: 0 %
Lymphocytes Relative: 18 %
Lymphs Abs: 1 10*3/uL (ref 0.7–4.0)
MCH: 26.4 pg (ref 26.0–34.0)
MCHC: 32 g/dL (ref 30.0–36.0)
MCV: 82.7 fL (ref 80.0–100.0)
Monocytes Absolute: 0.7 10*3/uL (ref 0.1–1.0)
Monocytes Relative: 12 %
NEUTROS ABS: 4 10*3/uL (ref 1.7–7.7)
NRBC: 0 % (ref 0.0–0.2)
Neutrophils Relative %: 68 %
PLATELETS: 208 10*3/uL (ref 150–400)
RBC: 3.82 MIL/uL — AB (ref 4.22–5.81)
RDW: 15.2 % (ref 11.5–15.5)
WBC: 5.8 10*3/uL (ref 4.0–10.5)

## 2018-09-30 MED ORDER — HEPARIN SOD (PORK) LOCK FLUSH 100 UNIT/ML IV SOLN
500.0000 [IU] | Freq: Once | INTRAVENOUS | Status: AC | PRN
  Administered 2018-09-30: 500 [IU]
  Filled 2018-09-30: qty 5

## 2018-09-30 MED ORDER — SODIUM CHLORIDE 0.9 % IV SOLN
Freq: Once | INTRAVENOUS | Status: AC
  Administered 2018-09-30: 10:00:00 via INTRAVENOUS
  Filled 2018-09-30: qty 250

## 2018-09-30 MED ORDER — FAMOTIDINE IN NACL 20-0.9 MG/50ML-% IV SOLN
INTRAVENOUS | Status: AC
  Filled 2018-09-30: qty 50

## 2018-09-30 MED ORDER — DEXAMETHASONE SODIUM PHOSPHATE 10 MG/ML IJ SOLN
INTRAMUSCULAR | Status: AC
  Filled 2018-09-30: qty 1

## 2018-09-30 MED ORDER — DIPHENHYDRAMINE HCL 50 MG/ML IJ SOLN
25.0000 mg | Freq: Once | INTRAMUSCULAR | Status: AC
  Administered 2018-09-30: 25 mg via INTRAVENOUS

## 2018-09-30 MED ORDER — SODIUM CHLORIDE 0.9% FLUSH
10.0000 mL | INTRAVENOUS | Status: AC | PRN
  Administered 2018-09-30: 10 mL
  Filled 2018-09-30: qty 10

## 2018-09-30 MED ORDER — SODIUM CHLORIDE 0.9 % IV SOLN
20.0000 mg/m2 | Freq: Once | INTRAVENOUS | Status: AC
  Administered 2018-09-30: 33 mg via INTRAVENOUS
  Filled 2018-09-30: qty 3.3

## 2018-09-30 MED ORDER — FAMOTIDINE IN NACL 20-0.9 MG/50ML-% IV SOLN
20.0000 mg | Freq: Once | INTRAVENOUS | Status: AC
  Administered 2018-09-30: 20 mg via INTRAVENOUS

## 2018-09-30 MED ORDER — DEXAMETHASONE SODIUM PHOSPHATE 10 MG/ML IJ SOLN
10.0000 mg | Freq: Once | INTRAMUSCULAR | Status: AC
  Administered 2018-09-30: 10 mg via INTRAVENOUS

## 2018-09-30 MED ORDER — DIPHENHYDRAMINE HCL 50 MG/ML IJ SOLN
INTRAMUSCULAR | Status: AC
  Filled 2018-09-30: qty 1

## 2018-09-30 NOTE — Patient Instructions (Signed)

## 2018-09-30 NOTE — Progress Notes (Signed)
  [facility] OFFICE PROGRESS NOTE   Diagnosis: Prostate cancer  Current therapy: Jevtana chemotherapy given at 20 mg/m every 3 weeks cycle 1 started on 07/29/2018.  He is here for cycle 3 of therapy.  INTERVAL HISTORY:   [name] returns as scheduled.  He completed cycle 3 Jevtana 09/09/2018.  He is accompanied by an interpreter.  He reports not feeling well and having a decreased appetite for about 1 week after each treatment.  He denies nausea/vomiting.  No mouth sores.  No constipation or diarrhea.  No numbness or tingling in his hands or feet.  He has intermittent abdominal pain.  He feels weak at times.  No back pain.  No urinary problems.  Objective:  Vital signs in last 24 hours:  Blood pressure 130/79, pulse 89, temperature 98.1 F (36.7 C), temperature source Oral, resp. rate 18, height 5\' 2"  (1.575 m), weight 135 lb 6.4 oz (61.4 kg), SpO2 100 %.    HEENT: No thrush or ulcers. Resp: Lungs clear bilaterally. Cardio: Regular rate and rhythm. GI: Abdomen soft.  Mild tenderness left abdomen.  No hepatosplenomegaly. Vascular: No leg edema. Neuro: Motor strength 5/5. Port-A-Cath without erythema.   Lab Results:  Lab Results  Component Value Date   WBC 5.8 09/30/2018   HGB 10.1 (L) 09/30/2018   HCT 31.6 (L) 09/30/2018   MCV 82.7 09/30/2018   PLT 208 09/30/2018   NEUTROABS 4.0 09/30/2018    Imaging:  No results found.  Medications: I have reviewed the patient's current medications.  Assessment/Plan: 1. 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  Currently on active treatment with Jevtana chemotherapy status post 3 cycles.  PSA from 09/09/2018 showed further improvement.  Disposition: [name] Johnston appears stable.  He has completed 3 cycles of Jevtana.  The most recent PSA was further improved.  Plan to proceed with cycle 4 Jevtana today as scheduled.  We reviewed the CBC from today.  Counts are adequate to proceed with  treatment.  He will receive Udenyca 10/01/2018.  He will return for lab, follow-up and the next cycle of Jevtana in 3 weeks.  He will contact the office in the interim with any problems    Ned Card ANP/GNP-BC   09/30/2018  10:13 AM

## 2018-09-30 NOTE — Patient Instructions (Signed)
[facility] Discharge Instructions for Patients Receiving Chemotherapy  Today you received the following chemotherapy agents Jevtana  To help prevent nausea and vomiting after your treatment, we encourage you to take your nausea medication as needed If you develop nausea and vomiting that is not controlled by your nausea medication, call the clinic.   BELOW ARE SYMPTOMS THAT SHOULD BE REPORTED IMMEDIATELY:  *FEVER GREATER THAN 100.5 F  *CHILLS WITH OR WITHOUT FEVER  NAUSEA AND VOMITING THAT IS NOT CONTROLLED WITH YOUR NAUSEA MEDICATION  *UNUSUAL SHORTNESS OF BREATH  *UNUSUAL BRUISING OR BLEEDING  TENDERNESS IN MOUTH AND THROAT WITH OR WITHOUT PRESENCE OF ULCERS  *URINARY PROBLEMS  *BOWEL PROBLEMS  UNUSUAL RASH Items with * indicate a potential emergency and should be followed up as soon as possible.  Feel free to call the clinic should you have any questions or concerns. The clinic phone number is [phone].  Please show the [place] at check-in to the Emergency Department and triage nurse.   Cabazitaxel injection ?y l thu?c g? CABAZITAXEL l thu?c ha tr? li?u. N ???c dng ?? ?i?u tr? ung th? nhi?p h? tuy?n. N nh?m ??n cc t? bo phn chia nhanh, nh? cc t? bo ung th?, v lm cho cc t? bo ? ch?t. Thu?c ny c th? ???c dng cho nh?ng m?c ?ch khc; hy h?i ng??i cung c?p d?ch v? y t? ho?c d??c s? c?a mnh, n?u qu v? c th?c m?c. (CC) NHN HI?U PH? BI?N: Jevtana Ti c?n ph?i bo cho ng??i cung c?p d?ch v? y t? c?a mnh ?i?u g tr??c khi dng thu?c ny? H? c?n bi?t li?u qu v? c b?t k? tnh tr?ng no sau ?y khng: -ti?n s? ch?y mu bao t? -b?nh th?n -b?nh gan -s? l??ng t? bo mu th?p, ch?ng h?n nh? s? l??ng b?ch c?u, ti?u c?u, ho?c h?ng c?u th?p -b?nh ph?i ho??c h h?p, ch??ng ha?n nh? hen suy?n -m?i x? tr? g?n ?y ho?c ?ang x? tr? -dng cc thu?c ?? ?i?u tr? ho?c phng ng?a c?c mu ?ng -pha?n ??ng b?t th???ng ho??c di?  ??ng v??i cabazitaxel ho?c polysorbate 80 -pha?n ??ng b?t th???ng ho??c di? ??ng v??i ca?c d??c ph?m kha?c, th?c ph?m, thu?c nhu?m, ho??c ch?t ba?o qua?n -?ang c thai ho??c ??nh co? thai -?ang cho con bu? Ti nn s? d?ng thu?c ny nh? th? no? Thu?c ny ?? truy?n vo t?nh m?ch. Thu?c ny ???c s? d?ng b?i chuyn vin y t? ? b?nh vi?n ho?c ? phng m?ch. Hy bn v?i bc s? nhi khoa c?a qu v? v? vi?c dng thu?c ny ? tr? em. C th? c?n ch?m [place] ??c bi?t. Qu li?u: N?u qu v? cho r?ng mnh ? dng qu nhi?u thu?c ny, th hy lin l?c v?i trung tm ki?m sot ch?t ??c ho?c phng c?p c?u ngay l?p t?c. L?U : Thu?c ny ch? dnh ring cho qu v?. Khng chia s? thu?c ny v?i nh?ng ng??i khc. N?u ti l? qun m?t li?u th sao? ?i?u quan tr?ng l khng nn b? l? li?u thu?c no. Hy lin l?c v?i bc s? ho?c [place] vin y t? c?a mnh, n?u qu v? khng th? gi? ?ng cu?c h?n khm. Nh?ng g c th? t??ng tc v?i thu?c ny? -cc thu?c dng ?? tr? nhi?m HIV ho?c AIDS -clarithromycin -m?t s? thu?c dng ?? tr? cc b?nh nhi?m n?m, ch?ng h?n nh? fluconazole, itraconazole, ketoconazole, voriconazole -nefazodone -telithromycin [facility] sch ny c th? khng m t? ?? h?t cc t??ng  tc c th? x?y ra. Hy ??a cho ng??i cung c?p d?ch v? y t? c?a mnh danh sch t?t c? cc thu?c, th?o d??c, cc thu?c khng c?n toa, ho?c cc ch? ph?m b? sung m qu v? dng. C?ng nn bo cho h? bi?t r?ng qu v? c ht thu?c, u?ng r??u, ho?c c s? d?ng ma ty tri php hay khng. Vi th? c th? t??ng tc v?i thu?c c?a qu v?. Ti c?n ph?i theo di ?i?u g trong khi dng thu?c ny? Qu v? s? ???c theo di ch?t ch? trong khi dng thu?c ny. Thu?c ny c th? lm cho qu v? c?m th?y khng ???c kh?e nh? th??ng l?. ?i?u ny khng ph?i khng ph? bi?n, b?i v thu?c ha tr? li?u c th? ?nh h??ng ??n c? t? bo lnh l?n t? bo ung th?. Hy t??ng trnh m?i tc d?ng ph?. Hy ti?p t?c ??t ?i?u tr? c?a mnh ngay c? khi qu v? c?m th?y m?t, tr? khi bc s? yu  c?u qu v? ng?ng ?i?u tr?. Hy h?i  ki?n bc s? ho?c chuyn vin y t?, n?u qu v? b? s?t, ?n l?nh ho?c ?au h?ng, ho?c c cc tri?u ch?ng khc c?a c?m l?nh ho?c cm. Khng ???c t? ?i?u tr? cho mnh. Thu?c ny c th? lm gi?m kh? n?ng ch?ng l?i cc b?nh nhi?m trng c?a c? th?. Hy c? trnh ? g?n nh?ng ng??i b? b?nh. Thu?c ny c th? lm t?ng nguy c? b? b?m tm ho?c ch?y mu. Hy lin l?c v?i bc s? ho?c chuyn vin y t?, n?u qu v? th?y ch?y mu b?t th??ng. Hy c?n th?n khi ?nh r?ng ho?c x?a r?ng b?ng ch? nha khoa ho?c b?ng t?m, b?i v qu v? c th? d? b? nhi?m trng ho?c d? b? ch?y mu h?n. N?u qu v? c ?i lm r?ng, th hy bo v?i nha s? r?ng qu v? ?ang dng thu?c ny. Trnh dng cc thu?c c ch?a aspirin, acetaminophen, ibuprofen, naproxen, ho?c ketoprofen, tr? khi ? ???c bc s? ch? d?n. Cc thu?c ny c th? che l?p tri?u ch?ng s?t. Khng ???c ?? c thai trong khi dng thu?c ny. Ph? n? c?n ph?i thng bo cho bc s? c?a mnh, n?u mu?n c thai ho?c ngh? r?ng c th? mnh ? c [place]. Nam gi??i khng nn gy thu? thai trong khi du?ng thu?c na?y va? trong vng 3 tha?ng sau khi ng?ng thu?c. C nguy c? v? cc tc d?ng ph? nghim tr?ng ??i v?i [place] nhi. Hy th?o lu?n v?i bc s? ho?c chuyn vin y t? ho?c d??c s? ?? bi?t thm thng tin. Khng ???c nui con b?ng s?a m? trong khi dng thu?c ny. Ti c th? nh?n th?y nh?ng tc d?ng ph? no khi dng thu?c ny? Nh?ng tc d?ng ph? qu v? c?n ph?i bo cho bc s? ho?c chuyn vin y t? cng s?m cng t?t: -cc ph?n ?ng d? ?ng, ch?ng h?n nh? da b? m?n ??, ng?a, n?i my ?ay, s?ng ? m?t, mi, ho?c l??i -kh th? -to bn -tiu ch?y -?au, t ho?c c?m gic nh? b? ki?n b ? bn tay ho?c bn chn -?au b?ng d? d?i -cc d?u hi?u nhi?m trng - s?t ho?c ?n l?nh, ho, ?au h?ng, kh ?i ti?u ho?c ?i ti?u ?au -cc d?u hi?u v tri?u ch?ng t?n th??ng th?n, nh? kh ?i ti?u ho?c thay ??i l??ng n??c ti?u -cc d?u hi?u gi?m ti?u c?u ho?c xu?t huy?t - b?m tm, cc n?t l?m t?m ?? trn  da, phn  c mu ?en, mu h?c n, c mu trong n??c ti?u -cc d?u hi?u gi?m s? l??ng t? bo h?ng c?u - c?m th?y y?u ?t ho?c m?t m?i m?t cch b?t th??ng, cc c?n ng?t x?u, chong vng -i m?a Cc tc d?ng ph? khng c?n ph?i ch?m [place] y t? (hy bo cho bc s? ho?c chuyn vin y t?, n?u cc tc d?ng ph? ny ti?p di?n ho?c gy phi?n toi): -?au l?ng -th?y v? c?a cc mn ?n thay ??i -r?ng tc -?au ??u -m?t c?m gic ngon mi?ng -?au ? kh?p ho?c c? b?p -bu?n i -kh ch?u ? bao t? Danh sch ny c th? khng m t? ?? h?t cc tc d?ng ph? c th? x?y ra. Xin g?i t?i bc s? c?a mnh ?? ???c c? v?n chuyn mn v? cc tc d?ng ph?Sander Nephew v? c th? t??ng trnh cc tc d?ng ph? cho FDA theo s? [phone]. Ti nn c?t gi? thu?c c?a mnh ? ?u? Thu?c ny ???c s? d?ng b?i chuyn vin y t? ? b?nh vi?n ho?c ? phng m?ch. Qu v? s? khng ???c c?p thu?c ny ?? c?t gi? t?i nh. L?U : ?y l b?n tm t?t. N c th? khng bao hm t?t c? thng tin c th? c. N?u qu v? th?c m?c v? thu?c ny, xin trao ??i v?i bc s?, d??c s?, ho?c ng??i cung c?p d?ch v? y t? c?a mnh.  2018 Elsevier/Gold Standard (2017-01-07 00:00:00)

## 2018-09-30 NOTE — Telephone Encounter (Signed)
Scheduled appt per 10/11 los - pt to get an updated next visit.

## 2018-10-01 ENCOUNTER — Inpatient Hospital Stay: Payer: PPO

## 2018-10-01 VITALS — BP 137/77 | HR 86 | Temp 98.4°F | Resp 17

## 2018-10-01 DIAGNOSIS — C61 Malignant neoplasm of prostate: Secondary | ICD-10-CM

## 2018-10-01 DIAGNOSIS — Z5111 Encounter for antineoplastic chemotherapy: Secondary | ICD-10-CM | POA: Diagnosis not present

## 2018-10-01 LAB — PROSTATE-SPECIFIC AG, SERUM (LABCORP): PROSTATE SPECIFIC AG, SERUM: 2.2 ng/mL (ref 0.0–4.0)

## 2018-10-01 MED ORDER — PEGFILGRASTIM-CBQV 6 MG/0.6ML ~~LOC~~ SOSY
PREFILLED_SYRINGE | SUBCUTANEOUS | Status: AC
  Filled 2018-10-01: qty 0.6

## 2018-10-01 MED ORDER — PEGFILGRASTIM-CBQV 6 MG/0.6ML ~~LOC~~ SOSY
6.0000 mg | PREFILLED_SYRINGE | Freq: Once | SUBCUTANEOUS | Status: AC
  Administered 2018-10-01: 6 mg via SUBCUTANEOUS

## 2018-10-01 NOTE — Patient Instructions (Signed)
Pegfilgrastim injection What is this medicine? PEGFILGRASTIM (PEG fil gra stim) is a long-acting granulocyte colony-stimulating factor that stimulates the growth of neutrophils, a type of white blood cell important in the body's fight against infection. It is used to reduce the incidence of fever and infection in patients with certain types of cancer who are receiving chemotherapy that affects the bone marrow, and to increase survival after being exposed to high doses of radiation. This medicine may be used for other purposes; ask your health care provider or pharmacist if you have questions. COMMON BRAND NAME(S): Neulasta, Udencya What should I tell my health care provider before I take this medicine? They need to know if you have any of these conditions: -kidney disease -latex allergy -ongoing radiation therapy -sickle cell disease -skin reactions to acrylic adhesives (On-Body Injector only) -an unusual or allergic reaction to pegfilgrastim, filgrastim, other medicines, foods, dyes, or preservatives -pregnant or trying to get pregnant -breast-feeding How should I use this medicine? This medicine is for injection under the skin. If you get this medicine at home, you will be taught how to prepare and give the pre-filled syringe or how to use the On-body Injector. Refer to the patient Instructions for Use for detailed instructions. Use exactly as directed. Tell your healthcare provider immediately if you suspect that the On-body Injector may not have performed as intended or if you suspect the use of the On-body Injector resulted in a missed or partial dose. It is important that you put your used needles and syringes in a special sharps container. Do not put them in a trash can. If you do not have a sharps container, call your pharmacist or healthcare provider to get one. Talk to your pediatrician regarding the use of this medicine in children. While this drug may be prescribed for selected  conditions, precautions do apply. Overdosage: If you think you have taken too much of this medicine contact a poison control center or emergency room at once. NOTE: This medicine is only for you. Do not share this medicine with others. What if I miss a dose? It is important not to miss your dose. Call your doctor or health care professional if you miss your dose. If you miss a dose due to an On-body Injector failure or leakage, a new dose should be administered as soon as possible using a single prefilled syringe for manual use. What may interact with this medicine? Interactions have not been studied. Give your health care provider a list of all the medicines, herbs, non-prescription drugs, or dietary supplements you use. Also tell them if you smoke, drink alcohol, or use illegal drugs. Some items may interact with your medicine. This list may not describe all possible interactions. Give your health care provider a list of all the medicines, herbs, non-prescription drugs, or dietary supplements you use. Also tell them if you smoke, drink alcohol, or use illegal drugs. Some items may interact with your medicine. What should I watch for while using this medicine? You may need blood work done while you are taking this medicine. If you are going to need a MRI, CT scan, or other procedure, tell your doctor that you are using this medicine (On-Body Injector only). What side effects may I notice from receiving this medicine? Side effects that you should report to your doctor or health care professional as soon as possible: -allergic reactions like skin rash, itching or hives, swelling of the face, lips, or tongue -dizziness -fever -pain, redness, or irritation at   site where injected -pinpoint red spots on the skin -red or dark-brown urine -shortness of breath or breathing problems -stomach or side pain, or pain at the shoulder -swelling -tiredness -trouble passing urine or change in the amount of  urine Side effects that usually do not require medical attention (report to your doctor or health care professional if they continue or are bothersome): -bone pain -muscle pain This list may not describe all possible side effects. Call your doctor for medical advice about side effects. You may report side effects to FDA at 1-800-FDA-1088. Where should I keep my medicine? Keep out of the reach of children. Store pre-filled syringes in a refrigerator between 2 and 8 degrees C (36 and 46 degrees F). Do not freeze. Keep in carton to protect from light. Throw away this medicine if it is left out of the refrigerator for more than 48 hours. Throw away any unused medicine after the expiration date. NOTE: This sheet is a summary. It may not cover all possible information. If you have questions about this medicine, talk to your doctor, pharmacist, or health care provider.  2018 [employer] (2016-12-03 12:58:03)  

## 2018-10-04 DIAGNOSIS — R350 Frequency of micturition: Secondary | ICD-10-CM | POA: Diagnosis not present

## 2018-10-04 DIAGNOSIS — C61 Malignant neoplasm of prostate: Secondary | ICD-10-CM | POA: Diagnosis not present

## 2018-10-04 DIAGNOSIS — R310 Gross hematuria: Secondary | ICD-10-CM | POA: Diagnosis not present

## 2018-10-04 DIAGNOSIS — N130 Hydronephrosis with ureteropelvic junction obstruction: Secondary | ICD-10-CM | POA: Diagnosis not present

## 2018-10-06 ENCOUNTER — Encounter (HOSPITAL_BASED_OUTPATIENT_CLINIC_OR_DEPARTMENT_OTHER): Payer: Self-pay | Admitting: *Deleted

## 2018-10-06 ENCOUNTER — Other Ambulatory Visit: Payer: Self-pay | Admitting: Urology

## 2018-10-10 ENCOUNTER — Other Ambulatory Visit: Payer: Self-pay

## 2018-10-10 ENCOUNTER — Encounter (HOSPITAL_BASED_OUTPATIENT_CLINIC_OR_DEPARTMENT_OTHER): Payer: Self-pay | Admitting: *Deleted

## 2018-10-10 MED FILL — ABIRATERONE ACETATE 250 MG: 250 | 30 days supply | Qty: 120 | Fill #0

## 2018-10-10 NOTE — Progress Notes (Signed)
Spoke with daughter via telephone for pre op interview. No medications AM of surgery. NPO after MN. Patient to arrive at 0915.

## 2018-10-10 NOTE — H&P (Signed)
CC: I have prostate cancer (treatment).  HPI: [name] Johnston is a 67 year-old male established patient who is here for prostate cancer which has been treated.  He is not participating in active surveillance. He has undergone Hormonal Therapy for treatment. He is not currently receiving hormonal manipulation. He did undergo chemotherapy for treatment of his prostate cancer. He received docetaxel for treatment of his prostate cancer. Patient denies mitoxantrone.   His PSA blood tests have not been low since his prostate cancer treatment was started. His PSA is rising.   He does not have to strain or bear down to start his urinary stream.   [name] Johnston returns today in f/u. 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 He has had prior Taxane chemotherapy over 2 years ago and has subsequently progressed on abriaterone and was found to have a liver met on CT on 07/18/18 and is now on Jevtana chemo with the last infusion on 09/30/18. His PSA has declined on that therapy to 2.2 from 12.5 in July. He has no had subsequent scanning. He has a left ureteral stent that was replaced on 06/21/18 but the CT on 07/18/18 showed progressive hydronephrosis but I was not made aware of that. He has irritative voiding symptoms from the stent. He has had no further hematuria. He has had a prior TURP. He had microhematuria in 2/19 He did have radation as part of his initial therapy. His UA has >60 RBC's today.       CC: AUA Questions Scoring.  HPI:     AUA Symptom Score: Less than 50% of the time and more than 50% of the time he has the sensation of not emptying his bladder completely when finished urinating. Almost always he has to urinate again fewer than two hours after he has finished urinating. Less than 20% of the time and almost always he has to start and stop again several times when he urinates. Almost always he finds it difficult to postpone urination. He never has a  weak urinary stream. He never has to push or strain to begin urination. He has to get up to urinate 5 or more times from the time he goes to bed until the time he gets up in the morning.   Calculated AUA Symptom Score: 18    ALLERGIES: Aspirin Low Dose TABS    MEDICATIONS: Percocet  Tamsulosin Hcl 0.4 mg capsule 1 capsule PO Daily  Prednisone  Tylenol  Zytiga     GU PSH: Cysto Dilate Stricture (M or F) - 01/25/2018 Cystoscopy Insert Stent, Left - 06/21/2018, Left - 01/25/2018 Cystoscopy TURP - 2014 Hydrocele repair, Left - 01/25/2018 Remove Prostate Regrowth - 2015 Simple orchiectomy, Bilateral - 01/25/2018    NON-GU PSH: None         GU PMH: Urinary Hesitancy - 07/11/2018 Gross hematuria - 06/13/2018 Prostate Cancer (Worsening), His PSA is rising on ADT and Abiraterone. 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 I am going to  get him set up for a left hydrocelectomy and bilateral orchiectomy which will eliminate the need for lupron. I have reviewed the risks of bleeding, infection, recurrent hydrocele, thrombotic events and anesthetic complications. , - 01/21/2018 Chronic kidney disease stage 3 (GFR 30-60) - 01/21/2018 Hydronephrosis, He has malignant left ureteral obstruction with some pain and progressive AKI. I will do cystoscopy with stenting at the time of the hydrocelectomy. I reviewed this additional risks including inability to place a stent. I am not sure I would recommend a perc if I can't get a stent up with his  progressive CRCP. 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 He had a cysto and TURP in June 2014 which revealed Gleason's 4+4 equals 8 adenocarcinoma involving approximate 70% of the resected prostate tissue. He had radiation and has been on hormonal therapy since July 2014. Testosterone has confirmed to be at castrate levels. PSA was 0.33 in Feb 2015.        NON-GU PMH: Encounter for general adult medical examination without abnormal findings, Encounter for preventive health examination - 2017 Asthma, Asthma - 2014     FAMILY HISTORY: Family Health Status - Father alive at age 48 - Father Family Health Status - Mother's Age - Mother Family Health Status Number - Runs In Family No Significant Family History - Runs In Family    SOCIAL HISTORY: Marital Status: Married Ethnicity: Not Hispanic Or Latino; Race: Unknown Current Smoking Status: Patient has never smoked.  Has never drank.  Does not drink caffeine.     REVIEW OF SYSTEMS:     GU Review Male:  Patient reports burning/ pain with urination and trouble starting your stream. Patient denies frequent urination, hard to postpone urination, get up at night to urinate, leakage of urine, stream starts and stops, have to strain to urinate , erection problems, and penile pain.    Gastrointestinal (Upper):  Patient denies nausea, indigestion/ heartburn, and vomiting.    Gastrointestinal (Lower):  Patient denies diarrhea and  constipation.    Constitutional:  Patient denies fever, night sweats, weight loss, and fatigue.    Skin:  Patient denies skin rash/ lesion and itching.    Eyes:  Patient denies blurred vision and double vision.    Ears/ Nose/ Throat:  Patient denies sore throat and sinus problems.    Hematologic/Lymphatic:  Patient denies swollen glands and easy bruising.    Cardiovascular:  Patient denies leg swelling and chest pains.    Respiratory:  Patient denies cough and shortness of breath.    Endocrine:  Patient denies excessive thirst.    Musculoskeletal:  Patient denies back pain and joint pain.    Neurological:  Patient denies headaches and dizziness.    Psychologic:  Patient denies depression and anxiety.    VITAL SIGNS:       10/04/2018 10:22 AM     Weight 135 lb / 61.23 kg     BP 117/75 mmHg     Pulse 90 /min     Temperature 98.3 F /  36.8 C     MULTI-SYSTEM PHYSICAL EXAMINATION:      Constitutional: Well-nourished. No physical deformities. Normally developed. Good grooming.     Respiratory: No labored breathing, no use of accessory muscles. CTA     Cardiovascular: Normal temperature, RRR without murmur            PAST DATA REVIEWED:   Source Of History:  Patient  Lab Test Review:  PSA, CBC with Diff, CMP  Records Review:  AUA Symptom Score, Previous Doctor Records  Urine Test Review:  Urinalysis  X-Ray Review: C.T. Abdomen/Pelvis: Reviewed Films. Reviewed Report. Discussed With Patient. 7/29 persistent left hydro     03/15/15 10/30/14 06/19/14 02/13/14 11/07/13 08/11/13 05/09/13  PSA  Total PSA 79.34  11.08  0.44  0.33  0.76  2.97  37.66     03/14/15 10/29/14 02/13/14 11/07/13 08/11/13  Hormones  Testosterone, Total 30  19  20  33  30    Notes:  records from Dr. Shadad reviewed. His Cr is normal at 0.8.    PROCEDURES:    Urinalysis w/Scope - 81001  Dipstick Dipstick Cont'd Micro  Color: Amber Bilirubin: Neg WBC/hpf: 6 - 10/hpf  Appearance: Cloudy Ketones: Neg RBC/hpf:  >60/hpf  Specific Gravity: 1.025 Blood: 3+ Bacteria: NS (Not Seen)  pH: 6.0 Protein: 3+ Cystals: NS (Not Seen)  Glucose: Neg Urobilinogen: 0.2 Casts: NS (Not Seen)   Nitrites: Neg Trichomonas: Not Present   Leukocyte Esterase: Trace Mucous: Not Present    Epithelial Cells: 0 - 5/hpf    Yeast: NS (Not Seen)    Sperm: Not Present   Notes:       ASSESSMENT:     ICD-10 Details  1 GU:  Prostate Cancer - C61 He has CRCP with visceral mets but is responding to Jevtana.   2  Hydronephrosis - N13.0 Worsening - He had persistent left hydro on his scan on 07/18/18 but has normal renal function. I am going to sent him up for a stent exchange. He is aware of the risks.   3  Urinary Frequency - R35.0    PLAN:   Orders  Labs CULTURE, URINE  Schedule  Return Visit/Planned Activity: ASAP - Schedule Surgery  Note: Needs stent exchange.     

## 2018-10-11 ENCOUNTER — Encounter (HOSPITAL_BASED_OUTPATIENT_CLINIC_OR_DEPARTMENT_OTHER)
Admission: RE | Disposition: A | Discharge status: Home / Self Care | Payer: Self-pay | Source: Ambulatory Visit | Attending: Urology

## 2018-10-11 ENCOUNTER — Encounter (HOSPITAL_BASED_OUTPATIENT_CLINIC_OR_DEPARTMENT_OTHER): Payer: Self-pay | Admitting: *Deleted

## 2018-10-11 ENCOUNTER — Ambulatory Visit (HOSPITAL_BASED_OUTPATIENT_CLINIC_OR_DEPARTMENT_OTHER)
Admission: RE | Admit: 2018-10-11 | Discharge: 2018-10-11 | Disposition: A | Discharge status: Home / Self Care | Payer: PPO | Source: Ambulatory Visit | Attending: Urology | Admitting: Urology

## 2018-10-11 ENCOUNTER — Ambulatory Visit (HOSPITAL_BASED_OUTPATIENT_CLINIC_OR_DEPARTMENT_OTHER): Payer: PPO | Admitting: Anesthesiology

## 2018-10-11 ENCOUNTER — Other Ambulatory Visit: Payer: Self-pay

## 2018-10-11 DIAGNOSIS — Z9221 Personal history of antineoplastic chemotherapy: Secondary | ICD-10-CM | POA: Insufficient documentation

## 2018-10-11 DIAGNOSIS — I10 Essential (primary) hypertension: Secondary | ICD-10-CM | POA: Insufficient documentation

## 2018-10-11 DIAGNOSIS — K219 Gastro-esophageal reflux disease without esophagitis: Secondary | ICD-10-CM | POA: Diagnosis not present

## 2018-10-11 DIAGNOSIS — N135 Crossing vessel and stricture of ureter without hydronephrosis: Secondary | ICD-10-CM | POA: Diagnosis not present

## 2018-10-11 DIAGNOSIS — N1330 Unspecified hydronephrosis: Secondary | ICD-10-CM | POA: Diagnosis not present

## 2018-10-11 DIAGNOSIS — C61 Malignant neoplasm of prostate: Secondary | ICD-10-CM | POA: Diagnosis not present

## 2018-10-11 DIAGNOSIS — Z79899 Other long term (current) drug therapy: Secondary | ICD-10-CM | POA: Insufficient documentation

## 2018-10-11 DIAGNOSIS — N131 Hydronephrosis with ureteral stricture, not elsewhere classified: Secondary | ICD-10-CM | POA: Insufficient documentation

## 2018-10-11 DIAGNOSIS — R350 Frequency of micturition: Secondary | ICD-10-CM | POA: Insufficient documentation

## 2018-10-11 DIAGNOSIS — J45909 Unspecified asthma, uncomplicated: Secondary | ICD-10-CM | POA: Diagnosis not present

## 2018-10-11 DIAGNOSIS — Z886 Allergy status to analgesic agent status: Secondary | ICD-10-CM | POA: Diagnosis not present

## 2018-10-11 HISTORY — PX: CYSTOSCOPY W/ URETERAL STENT PLACEMENT: SHX1429

## 2018-10-11 SURGERY — CYSTOSCOPY, WITH RETROGRADE PYELOGRAM AND URETERAL STENT INSERTION
Anesthesia: General | Site: Ureter | Laterality: Bilateral

## 2018-10-11 MED ORDER — EPHEDRINE 5 MG/ML INJ
INTRAVENOUS | Status: AC
  Filled 2018-10-11: qty 10

## 2018-10-11 MED ORDER — CEFAZOLIN SODIUM-DEXTROSE 2-4 GM/100ML-% IV SOLN
INTRAVENOUS | Status: AC
  Filled 2018-10-11: qty 100

## 2018-10-11 MED ORDER — PROPOFOL 10 MG/ML IV BOLUS
INTRAVENOUS | Status: DC | PRN
  Administered 2018-10-11: 130 mg via INTRAVENOUS

## 2018-10-11 MED ORDER — LIDOCAINE 2% (20 MG/ML) 5 ML SYRINGE
INTRAMUSCULAR | Status: AC
  Filled 2018-10-11: qty 5

## 2018-10-11 MED ORDER — SODIUM CHLORIDE 0.9% FLUSH
3.0000 mL | Freq: Two times a day (BID) | INTRAVENOUS | Status: DC
  Filled 2018-10-11: qty 3

## 2018-10-11 MED ORDER — MIDAZOLAM HCL 2 MG/2ML IJ SOLN
INTRAMUSCULAR | Status: AC
  Filled 2018-10-11: qty 2

## 2018-10-11 MED ORDER — DEXAMETHASONE SODIUM PHOSPHATE 4 MG/ML IJ SOLN
INTRAMUSCULAR | Status: DC | PRN
  Administered 2018-10-11: 10 mg via INTRAVENOUS

## 2018-10-11 MED ORDER — FENTANYL CITRATE (PF) 100 MCG/2ML IJ SOLN
INTRAMUSCULAR | Status: AC
  Filled 2018-10-11: qty 2

## 2018-10-11 MED ORDER — PROMETHAZINE HCL 25 MG/ML IJ SOLN
6.2500 mg | INTRAMUSCULAR | Status: DC | PRN
  Filled 2018-10-11: qty 1

## 2018-10-11 MED ORDER — DEXAMETHASONE SODIUM PHOSPHATE 10 MG/ML IJ SOLN
INTRAMUSCULAR | Status: AC
  Filled 2018-10-11: qty 1

## 2018-10-11 MED ORDER — OXYCODONE HCL 5 MG PO TABS
5.0000 mg | ORAL_TABLET | Freq: Once | ORAL | Status: DC | PRN
  Filled 2018-10-11: qty 1

## 2018-10-11 MED ORDER — EPHEDRINE 5 MG/ML INJ
INTRAVENOUS | Status: AC
  Filled 2018-10-11: qty 20

## 2018-10-11 MED ORDER — SODIUM CHLORIDE 0.9 % IV SOLN
250.0000 mL | INTRAVENOUS | Status: DC | PRN
  Filled 2018-10-11: qty 250

## 2018-10-11 MED ORDER — CEFAZOLIN SODIUM-DEXTROSE 2-4 GM/100ML-% IV SOLN
2.0000 g | INTRAVENOUS | Status: AC
  Administered 2018-10-11: 2 g via INTRAVENOUS
  Filled 2018-10-11: qty 100

## 2018-10-11 MED ORDER — ONDANSETRON HCL 4 MG/2ML IJ SOLN
INTRAMUSCULAR | Status: AC
  Filled 2018-10-11: qty 2

## 2018-10-11 MED ORDER — OXYCODONE HCL 5 MG/5ML PO SOLN
5.0000 mg | Freq: Once | ORAL | Status: DC | PRN
  Filled 2018-10-11: qty 5

## 2018-10-11 MED ORDER — FENTANYL CITRATE (PF) 100 MCG/2ML IJ SOLN
INTRAMUSCULAR | Status: DC | PRN
  Administered 2018-10-11: 50 ug via INTRAVENOUS

## 2018-10-11 MED ORDER — LACTATED RINGERS IV SOLN
INTRAVENOUS | Status: DC
  Administered 2018-10-11: 10:00:00 via INTRAVENOUS
  Filled 2018-10-11: qty 1000

## 2018-10-11 MED ORDER — PROPOFOL 500 MG/50ML IV EMUL
INTRAVENOUS | Status: AC
  Filled 2018-10-11: qty 100

## 2018-10-11 MED ORDER — STERILE WATER FOR IRRIGATION IR SOLN
Status: DC | PRN
  Administered 2018-10-11: 500 mL
  Administered 2018-10-11: 3000 mL

## 2018-10-11 MED ORDER — ONDANSETRON HCL 4 MG/2ML IJ SOLN
INTRAMUSCULAR | Status: DC | PRN
  Administered 2018-10-11: 4 mg via INTRAVENOUS

## 2018-10-11 MED ORDER — SODIUM CHLORIDE 0.9% FLUSH
3.0000 mL | INTRAVENOUS | Status: DC | PRN
  Filled 2018-10-11: qty 3

## 2018-10-11 MED ORDER — ACETAMINOPHEN 325 MG PO TABS
650.0000 mg | ORAL_TABLET | ORAL | Status: DC | PRN
  Filled 2018-10-11: qty 2

## 2018-10-11 MED ORDER — EPHEDRINE SULFATE-NACL 50-0.9 MG/10ML-% IV SOSY
PREFILLED_SYRINGE | INTRAVENOUS | Status: DC | PRN
  Administered 2018-10-11: 10 mg via INTRAVENOUS

## 2018-10-11 MED ORDER — OXYCODONE HCL 5 MG PO TABS
5.0000 mg | ORAL_TABLET | ORAL | Status: DC | PRN
  Filled 2018-10-11: qty 2

## 2018-10-11 MED ORDER — MIDAZOLAM HCL 5 MG/5ML IJ SOLN
INTRAMUSCULAR | Status: DC | PRN
  Administered 2018-10-11 (×2): 1 mg via INTRAVENOUS

## 2018-10-11 MED ORDER — IOHEXOL 300 MG/ML  SOLN
INTRAMUSCULAR | Status: DC | PRN
  Administered 2018-10-11: 7 mL

## 2018-10-11 MED ORDER — LIDOCAINE 2% (20 MG/ML) 5 ML SYRINGE
INTRAMUSCULAR | Status: DC | PRN
  Administered 2018-10-11: 50 mg via INTRAVENOUS

## 2018-10-11 MED ORDER — ACETAMINOPHEN 650 MG RE SUPP
650.0000 mg | RECTAL | Status: DC | PRN
  Filled 2018-10-11: qty 1

## 2018-10-11 MED ORDER — MORPHINE SULFATE (PF) 2 MG/ML IV SOLN
2.0000 mg | INTRAVENOUS | Status: DC | PRN
  Filled 2018-10-11: qty 1

## 2018-10-11 MED ORDER — PHENYLEPHRINE 40 MCG/ML (10ML) SYRINGE FOR IV PUSH (FOR BLOOD PRESSURE SUPPORT)
PREFILLED_SYRINGE | INTRAVENOUS | Status: AC
  Filled 2018-10-11: qty 20

## 2018-10-11 MED ORDER — HYDROMORPHONE HCL 1 MG/ML IJ SOLN
0.2500 mg | INTRAMUSCULAR | Status: DC | PRN
  Filled 2018-10-11: qty 0.5

## 2018-10-11 SURGICAL SUPPLY — 31 items
BAG DRAIN URO-CYSTO SKYTR STRL (DRAIN) ×2 IMPLANT
BASKET STONE 1.7 NGAGE (UROLOGICAL SUPPLIES) IMPLANT
BASKET ZERO TIP NITINOL 2.4FR (BASKET) IMPLANT
CATH URET 5FR 28IN CONE TIP (BALLOONS)
CATH URET 5FR 28IN OPEN ENDED (CATHETERS) ×2 IMPLANT
CATH URET 5FR 70CM CONE TIP (BALLOONS) IMPLANT
CLOTH BEACON ORANGE TIMEOUT ST (SAFETY) ×2 IMPLANT
ELECT REM PT RETURN 9FT ADLT (ELECTROSURGICAL)
ELECTRODE REM PT RTRN 9FT ADLT (ELECTROSURGICAL) IMPLANT
FIBER LASER FLEXIVA 365 (UROLOGICAL SUPPLIES) IMPLANT
FIBER LASER TRAC TIP (UROLOGICAL SUPPLIES) IMPLANT
GLOVE BIO SURGEON STRL SZ 6.5 (GLOVE) ×2 IMPLANT
GLOVE BIOGEL PI IND STRL 6.5 (GLOVE) ×1 IMPLANT
GLOVE BIOGEL PI IND STRL 8.5 (GLOVE) ×2 IMPLANT
GLOVE BIOGEL PI INDICATOR 6.5 (GLOVE) ×1
GLOVE BIOGEL PI INDICATOR 8.5 (GLOVE) ×2
GLOVE SURG SS PI 8.0 STRL IVOR (GLOVE) ×2 IMPLANT
GOWN STRL REUS W/TWL LRG LVL3 (GOWN DISPOSABLE) ×2 IMPLANT
GOWN STRL REUS W/TWL XL LVL3 (GOWN DISPOSABLE) ×2 IMPLANT
GUIDEWIRE 0.038 PTFE COATED (WIRE) IMPLANT
GUIDEWIRE ANG ZIPWIRE 038X150 (WIRE) IMPLANT
GUIDEWIRE STR DUAL SENSOR (WIRE) ×2 IMPLANT
INFUSOR MANOMETER BAG 3000ML (MISCELLANEOUS) IMPLANT
IV NS IRRIG 3000ML ARTHROMATIC (IV SOLUTION) ×2 IMPLANT
KIT TURNOVER CYSTO (KITS) ×2 IMPLANT
MANIFOLD NEPTUNE II (INSTRUMENTS) ×2 IMPLANT
NS IRRIG 500ML POUR BTL (IV SOLUTION) ×2 IMPLANT
PACK CYSTO (CUSTOM PROCEDURE TRAY) ×2 IMPLANT
STENT CONTOUR 7FRX24 (STENTS) ×2 IMPLANT
TUBE CONNECTING 12X1/4 (SUCTIONS) ×2 IMPLANT
TUBING UROLOGY SET (TUBING) ×2 IMPLANT

## 2018-10-11 NOTE — Discharge Instructions (Addendum)
Ureteral Stent Implantation, Care After °Refer to this sheet in the next few weeks. These instructions provide you with information about caring for yourself after your procedure. Your health care provider may also give you more specific instructions. Your treatment has been planned according to current medical practices, but problems sometimes occur. Call your health care provider if you have any problems or questions after your procedure. °What can I expect after the procedure? °After the procedure, it is common to have: °· Nausea. °· Mild pain when you urinate. You may feel this pain in your lower back or lower abdomen. Pain should stop within a few minutes after you urinate. This may last for up to 1 week. °· A small amount of blood in your urine for several days. ° °Follow these instructions at home: ° °Medicines °· Take over-the-counter and prescription medicines only as told by your health care provider. °· If you were prescribed an antibiotic medicine, take it as told by your health care provider. Do not stop taking the antibiotic even if you start to feel better. °· Do not drive for 24 hours if you received a sedative. °· Do not drive or operate heavy machinery while taking prescription pain medicines. °Activity °· Return to your normal activities as told by your health care provider. Ask your health care provider what activities are safe for you. °· Do not lift anything that is heavier than 10 lb (4.5 kg). Follow this limit for 1 week after your procedure, or for as long as told by your health care provider. °General instructions °· Watch for any blood in your urine. Call your health care provider if the amount of blood in your urine increases. °· If you have a catheter: °? Follow instructions from your health care provider about taking care of your catheter and collection bag. °? Do not take baths, swim, or use a hot tub until your health care provider approves. °· Drink enough fluid to keep your urine  clear or pale yellow. °· Keep all follow-up visits as told by your health care provider. This is important. °Contact a health care provider if: °· You have pain that gets worse or does not get better with medicine, especially pain when you urinate. °· You have difficulty urinating. °· You feel nauseous or you vomit repeatedly during a period of more than 2 days after the procedure. °Get help right away if: °· Your urine is dark red or has blood clots in it. °· You are leaking urine (have incontinence). °· The end of the stent comes out of your urethra. °· You cannot urinate. °· You have sudden, sharp, or severe pain in your abdomen or lower back. °· You have a fever. °This information is not intended to replace advice given to you by your health care provider. Make sure you discuss any questions you have with your health care provider. °Document Released: 08/09/2013 Document Revised: 05/14/2016 Document Reviewed: 06/21/2015 °Elsevier Interactive Patient Education © 2018 [employer] ° ° °Post Anesthesia Home Care Instructions ° °Activity: °Get plenty of rest for the remainder of the day. A responsible individual must stay with you for 24 hours following the procedure.  °For the next 24 hours, DO NOT: °-Drive a car °-Operate machinery °-Drink alcoholic beverages °-Take any medication unless instructed by your physician °-Make any legal decisions or sign important papers. ° °Meals: °Start with liquid foods such as gelatin or soup. Progress to regular foods as tolerated. Avoid greasy, spicy, heavy foods. If nausea   and/or vomiting occur, drink only clear liquids until the nausea and/or vomiting subsides. Call your physician if vomiting continues. ° °Special Instructions/Symptoms: °Your throat may feel dry or sore from the anesthesia or the breathing tube placed in your throat during surgery. If this causes discomfort, gargle with warm salt water. The discomfort should disappear within 24 hours. ° °If you had a  scopolamine patch placed behind your ear for the management of post- operative nausea and/or vomiting: ° °1. The medication in the patch is effective for 72 hours, after which it should be removed.  Wrap patch in a tissue and discard in the trash. Wash hands thoroughly with soap and water. °2. You may remove the patch earlier than 72 hours if you experience unpleasant side effects which may include dry mouth, dizziness or visual disturbances. °3. Avoid touching the patch. Wash your hands with soap and water after contact with the patch. °  ° °

## 2018-10-11 NOTE — Transfer of Care (Signed)
Immediate Anesthesia Transfer of Care Note  Patient: [name] Johnston  Procedure(s) Performed: Procedure(s) (LRB): CYSTOSCOPY WITH RIGHT RETROGRADE LEFT URETERAL STENT EXCHANGE (Bilateral)  Patient Location: PACU  Anesthesia Type: General  Level of Consciousness: awake, oriented, sedated and patient cooperative  Airway & Oxygen Therapy: Patient Spontanous Breathing and Patient connected to face mask oxygen  Post-op Assessment: Report given to PACU RN and Post -op Vital signs reviewed and stable  Post vital signs: Reviewed and stable  Complications: No apparent anesthesia complications Last Vitals:  Vitals Value Taken Time  BP 118/73 10/11/2018 11:50 AM  Temp    Pulse 82 10/11/2018 11:52 AM  Resp 17 10/11/2018 11:52 AM  SpO2 98 % 10/11/2018 11:52 AM  Vitals shown include unvalidated device data.  Last Pain:  Vitals:   10/11/18 0935  TempSrc: Oral  PainSc: 0-No pain      Patients Stated Pain Goal: 5 (10/11/18 0935)

## 2018-10-11 NOTE — Anesthesia Procedure Notes (Signed)
Procedure Name: LMA Insertion Date/Time: 10/11/2018 11:24 AM Performed by: Lieutenant Diego, CRNA Pre-anesthesia Checklist: Patient identified, Emergency Drugs available, Suction available and Patient being monitored Patient Re-evaluated:Patient Re-evaluated prior to induction Oxygen Delivery Method: Circle system utilized Preoxygenation: Pre-oxygenation with 100% oxygen Induction Type: IV induction Ventilation: Mask ventilation without difficulty LMA: LMA inserted LMA Size: 4.0 Number of attempts: 1 Airway Equipment and Method: Bite block Placement Confirmation: positive ETCO2 Tube secured with: Tape Dental Injury: Teeth and Oropharynx as per pre-operative assessment

## 2018-10-11 NOTE — Anesthesia Postprocedure Evaluation (Signed)
Anesthesia Post Note  Patient: [name] Johnston  Procedure(s) Performed: CYSTOSCOPY WITH RIGHT RETROGRADE LEFT URETERAL STENT EXCHANGE (Bilateral Ureter)     Patient location during evaluation: PACU Anesthesia Type: General Level of consciousness: awake and alert Pain management: pain level controlled Vital Signs Assessment: post-procedure vital signs reviewed and stable Respiratory status: spontaneous breathing, nonlabored ventilation and respiratory function stable Cardiovascular status: blood pressure returned to baseline and stable Postop Assessment: no apparent nausea or vomiting Anesthetic complications: no    Last Vitals:  Vitals:   10/11/18 1215 10/11/18 1248  BP: 125/74 136/86  Pulse: 74 83  Resp: 12 12  Temp:  36.5 C  SpO2: 98% 97%    Last Pain:  Vitals:   10/11/18 1248  TempSrc: Axillary  PainSc:                  Lynda Rainwater

## 2018-10-11 NOTE — Op Note (Signed)
Procedure: 1.  Cystoscopy with right retrograde pyelogram and interpretation. 2.  Cystoscopy with removal and replacement of left double-J stent.  Preop diagnosis: Left ureteral obstruction from prostate cancer.  Postop diagnosis: Same.  Surgeon: Dr. [name].  Anesthesia: General.  Specimen: None.  Drain: 7 [place] by 24 cm left double-J stent.  Complications: None.  EBL: None.  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  He has had no flank pain but returns for stent change.  Procedure: He was given antibiotics and taken to the operating room where general anesthetic was induced.  He was placed in lithotomy position and fitted with PAS hose.  His perineum and genitalia were prepped with Betadine solution he was draped in usual sterile fashion.  Cystoscopy was performed using a 23 [place] scope and 30 degree lens.  Examination revealed a normal urethra.  The external sphincter was intact.  The prostatic urethra was short without evidence of obstruction with blanching suggestive radiation and somewhat fixed.  Inspection of the bladder revealed a smooth wall without tumors or stones.  The right ureteral orifice was unremarkable.  The left ureteral orifice had a stent in place without encrustation.  A right retrograde pyelogram was performed with 5 French opening catheter.  Omnipaque was injected.  Right retrograde pyelogram demonstrated a normal ureter and intrarenal collecting system without obstruction or filling defects.  The left stent was grasped with grasping forceps and pulled the urethral meatus.  A sensor guidewire was passed through the stent into the proximal ureter.  There was no evidence of stent obstruction.  A 5 French opening catheter was passed over the wire into the proximal ureter and the wire was  removed.  There was a brisk hydronephrotic drip.  Contrast was instilled.  And there was mild hydronephrosis noted of the left collecting system.  After removal of the opening catheter contrast was noted to flow alongside the wire.  The cystoscope was then reinserted over the wire and a 7 [place] by 24 cm contour double-J stent was advanced to the kidney without difficulty.  This was 1 [place] larger than the prior stent.  The wire was removed and a good coil was noted in the kidney and in the bladder.  The bladder was drained and the left collecting system appeared to be decompressing.  The cystoscope was removed.  He was taken down from lithotomy position, his anesthetic was reversed and he was moved to recovery in stable condition.  There were no complications.

## 2018-10-11 NOTE — Interval H&P Note (Signed)
History and Physical Interval Note:  10/11/2018 10:34 AM  [name] Johnston  has presented today for surgery, with the diagnosis of LEFT HYDRONEPHROSIS  The various methods of treatment have been discussed with the patient and family. After consideration of risks, benefits and other options for treatment, the patient has consented to  Procedure(s): CYSTOSCOPY WITH RIGHT RETROGRADE LEFT URETERAL STENT EXCHANGE (Bilateral) as a surgical intervention .  The patient's history has been reviewed, patient examined, no change in status, stable for surgery.  I have reviewed the patient's chart and labs.  Questions were answered to the patient's satisfaction.     [name]

## 2018-10-11 NOTE — Anesthesia Preprocedure Evaluation (Signed)
Anesthesia Evaluation  Patient identified by MRN, date of birth, ID band Patient awake    Reviewed: Allergy & Precautions, NPO status , Patient's Chart, lab work & pertinent test results  Airway Mallampati: II  TM Distance: >3 FB Neck ROM: Full    Dental no notable dental hx. (+) Teeth Intact   Pulmonary asthma ,    Pulmonary exam normal breath sounds clear to auscultation       Cardiovascular hypertension, Normal cardiovascular exam Rhythm:Regular Rate:Normal     Neuro/Psych Hx/o Bilateral subdural hematomas S/P Burr holes and drainage negative psych ROS   GI/Hepatic Neg liver ROS, GERD  ,  Endo/Other  negative endocrine ROS  Renal/GU Renal diseaseLeft ureteral obstruction with indwelling ureteral stent   Hx/o prostate Ca Bladder Ca    Musculoskeletal negative musculoskeletal ROS (+)   Abdominal   Peds  Hematology  (+) Blood dyscrasia, anemia ,   Anesthesia Other Findings Prostate Cancer  Reproductive/Obstetrics                             Anesthesia Physical  Anesthesia Plan  ASA: III  Anesthesia Plan: General   Post-op Pain Management:    Induction: Intravenous  PONV Risk Score and Plan: 2 and Ondansetron and Midazolam  Airway Management Planned: LMA  Additional Equipment:   Intra-op Plan:   Post-operative Plan: Extubation in OR  Informed Consent: I have reviewed the patients History and Physical, chart, labs and discussed the procedure including the risks, benefits and alternatives for the proposed anesthesia with the patient or authorized representative who has indicated his/her understanding and acceptance.   Dental advisory given  Plan Discussed with: CRNA and Surgeon  Anesthesia Plan Comments:         Anesthesia Quick Evaluation

## 2018-10-12 ENCOUNTER — Encounter (HOSPITAL_BASED_OUTPATIENT_CLINIC_OR_DEPARTMENT_OTHER): Payer: Self-pay | Admitting: Urology

## 2018-10-13 DIAGNOSIS — C61 Malignant neoplasm of prostate: Secondary | ICD-10-CM | POA: Diagnosis not present

## 2018-10-13 DIAGNOSIS — N130 Hydronephrosis with ureteropelvic junction obstruction: Secondary | ICD-10-CM | POA: Diagnosis not present

## 2018-10-13 DIAGNOSIS — R300 Dysuria: Secondary | ICD-10-CM | POA: Diagnosis not present

## 2018-10-14 MED FILL — TAMSULOSIN HCL 0.4 MG CAP: 0.4 | 30 days supply | Qty: 30 | Fill #1

## 2018-10-21 ENCOUNTER — Inpatient Hospital Stay: Payer: PPO

## 2018-10-21 ENCOUNTER — Inpatient Hospital Stay (HOSPITAL_BASED_OUTPATIENT_CLINIC_OR_DEPARTMENT_OTHER): Payer: PPO | Admitting: Oncology

## 2018-10-21 ENCOUNTER — Inpatient Hospital Stay: Payer: PPO | Attending: Oncology

## 2018-10-21 VITALS — BP 122/74 | HR 81 | Temp 98.6°F | Resp 17 | Wt 134.0 lb

## 2018-10-21 DIAGNOSIS — R630 Anorexia: Secondary | ICD-10-CM | POA: Diagnosis not present

## 2018-10-21 DIAGNOSIS — Z5111 Encounter for antineoplastic chemotherapy: Secondary | ICD-10-CM | POA: Insufficient documentation

## 2018-10-21 DIAGNOSIS — Z95828 Presence of other vascular implants and grafts: Secondary | ICD-10-CM

## 2018-10-21 DIAGNOSIS — E291 Testicular hypofunction: Secondary | ICD-10-CM | POA: Insufficient documentation

## 2018-10-21 DIAGNOSIS — C61 Malignant neoplasm of prostate: Secondary | ICD-10-CM | POA: Insufficient documentation

## 2018-10-21 DIAGNOSIS — C787 Secondary malignant neoplasm of liver and intrahepatic bile duct: Secondary | ICD-10-CM

## 2018-10-21 DIAGNOSIS — D6481 Anemia due to antineoplastic chemotherapy: Secondary | ICD-10-CM | POA: Insufficient documentation

## 2018-10-21 DIAGNOSIS — C7800 Secondary malignant neoplasm of unspecified lung: Secondary | ICD-10-CM

## 2018-10-21 DIAGNOSIS — R300 Dysuria: Secondary | ICD-10-CM | POA: Diagnosis not present

## 2018-10-21 DIAGNOSIS — R319 Hematuria, unspecified: Secondary | ICD-10-CM | POA: Diagnosis not present

## 2018-10-21 LAB — CBC WITH DIFFERENTIAL (CANCER CENTER ONLY)
Abs Immature Granulocytes: 0.01 10*3/uL (ref 0.00–0.07)
BASOS PCT: 2 %
Basophils Absolute: 0.1 10*3/uL (ref 0.0–0.1)
Eosinophils Absolute: 0.1 10*3/uL (ref 0.0–0.5)
Eosinophils Relative: 1 %
HCT: 32.5 % — ABNORMAL LOW (ref 39.0–52.0)
Hemoglobin: 10.5 g/dL — ABNORMAL LOW (ref 13.0–17.0)
IMMATURE GRANULOCYTES: 0 %
Lymphocytes Relative: 20 %
Lymphs Abs: 1 10*3/uL (ref 0.7–4.0)
MCH: 26.6 pg (ref 26.0–34.0)
MCHC: 32.3 g/dL (ref 30.0–36.0)
MCV: 82.3 fL (ref 80.0–100.0)
Monocytes Absolute: 0.5 10*3/uL (ref 0.1–1.0)
Monocytes Relative: 11 %
NEUTROS ABS: 3 10*3/uL (ref 1.7–7.7)
NEUTROS PCT: 66 %
PLATELETS: 197 10*3/uL (ref 150–400)
RBC: 3.95 MIL/uL — AB (ref 4.22–5.81)
RDW: 14.7 % (ref 11.5–15.5)
WBC Count: 4.7 10*3/uL (ref 4.0–10.5)
nRBC: 0 % (ref 0.0–0.2)

## 2018-10-21 LAB — CMP (CANCER CENTER ONLY)
ALT: 18 U/L (ref 0–44)
ANION GAP: 9 (ref 5–15)
AST: 17 U/L (ref 15–41)
Albumin: 3.7 g/dL (ref 3.5–5.0)
Alkaline Phosphatase: 111 U/L (ref 38–126)
BUN: 20 mg/dL (ref 8–23)
CO2: 25 mmol/L (ref 22–32)
Calcium: 9.4 mg/dL (ref 8.9–10.3)
Chloride: 107 mmol/L (ref 98–111)
Creatinine: 0.9 mg/dL (ref 0.61–1.24)
GFR, Est AFR Am: >60 mL/min (ref >60)
GFR, Estimated: >60 mL/min (ref >60)
GLUCOSE: 99 mg/dL (ref 70–99)
POTASSIUM: 4 mmol/L (ref 3.5–5.1)
SODIUM: 141 mmol/L (ref 135–145)
TOTAL PROTEIN: 7.1 g/dL (ref 6.5–8.1)
Total Bilirubin: 0.6 mg/dL (ref 0.3–1.2)

## 2018-10-21 MED ORDER — DIPHENHYDRAMINE HCL 50 MG/ML IJ SOLN
INTRAMUSCULAR | Status: AC
  Filled 2018-10-21: qty 1

## 2018-10-21 MED ORDER — SODIUM CHLORIDE 0.9% FLUSH
10.0000 mL | Freq: Once | INTRAVENOUS | Status: AC
  Administered 2018-10-21: 10 mL
  Filled 2018-10-21: qty 10

## 2018-10-21 MED ORDER — DEXTROSE 5 % IV SOLN
20.0000 mg/m2 | Freq: Once | INTRAVENOUS | Status: AC
  Administered 2018-10-21: 33 mg via INTRAVENOUS
  Filled 2018-10-21: qty 3.3

## 2018-10-21 MED ORDER — DEXAMETHASONE SODIUM PHOSPHATE 10 MG/ML IJ SOLN
10.0000 mg | Freq: Once | INTRAMUSCULAR | Status: AC
  Administered 2018-10-21: 10 mg via INTRAVENOUS

## 2018-10-21 MED ORDER — SODIUM CHLORIDE 0.9 % IV SOLN
Freq: Once | INTRAVENOUS | Status: AC
  Administered 2018-10-21: 10:00:00 via INTRAVENOUS
  Filled 2018-10-21: qty 250

## 2018-10-21 MED ORDER — HEPARIN SOD (PORK) LOCK FLUSH 100 UNIT/ML IV SOLN
500.0000 [IU] | Freq: Once | INTRAVENOUS | Status: AC | PRN
  Administered 2018-10-21: 500 [IU]
  Filled 2018-10-21: qty 5

## 2018-10-21 MED ORDER — FAMOTIDINE IN NACL 20-0.9 MG/50ML-% IV SOLN
20.0000 mg | Freq: Once | INTRAVENOUS | Status: AC
  Administered 2018-10-21: 20 mg via INTRAVENOUS

## 2018-10-21 MED ORDER — SODIUM CHLORIDE 0.9% FLUSH
10.0000 mL | INTRAVENOUS | Status: DC | PRN
  Administered 2018-10-21: 10 mL
  Filled 2018-10-21: qty 10

## 2018-10-21 MED ORDER — DIPHENHYDRAMINE HCL 50 MG/ML IJ SOLN
25.0000 mg | Freq: Once | INTRAMUSCULAR | Status: AC
  Administered 2018-10-21: 25 mg via INTRAVENOUS

## 2018-10-21 MED ORDER — FAMOTIDINE IN NACL 20-0.9 MG/50ML-% IV SOLN
INTRAVENOUS | Status: AC
  Filled 2018-10-21: qty 50

## 2018-10-21 MED ORDER — DEXAMETHASONE SODIUM PHOSPHATE 10 MG/ML IJ SOLN
INTRAMUSCULAR | Status: AC
  Filled 2018-10-21: qty 1

## 2018-10-21 NOTE — Progress Notes (Signed)
Hematology and Oncology Follow Up Visit  [name] Johnston [id number] 02-Oct-1951 67 y.o. 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  He was initially initial diagnosed in 2014 with Gleason score 8 and a PSA 40.   Prior Therapy:  He is started [place] on 06/27/2013. He subsequently received definitive radiation therapy for a planned dose of 75 gray completed in November 2014 under the care of Dr. Tammi Klippel.   He developed castration resistant disease with biopsy-proven liver metastasis in May 2016.  Docetaxel 75 mg/m given every 3 weeks with Neulasta support. He is status post 10 cycles of treatment concluded on 11/21/2015. He had an excellent PSA response with PSA dropping from 222 to 0.17.   PSA started to rise again with PSA up to 8.9 in August 2017.  He is status post orchiectomy completed in February 2019.  Zytiga 1000 mg with prednisone since September 2017.    Current therapy: Jevtana chemotherapy given at 20 mg/m every 3 weeks cycle 1 started on 07/29/2018.  He is here for cycle 5 of therapy.  Interim History:  [name] Johnston returns today for a repeat evaluation.  Since her last visit, he underwent cystoscopy and replacement of a left double-J stent under the care of Dr. Jeffie Pollock on October 11, 2018.  He tolerated the procedure well but did have some discomfort with urination afterwards that has resolved.  He has tolerated chemotherapy without any nausea, vomiting or changes in his appetite.  He eats well and maintaining his weight.  He denies any worsening neuropathy or diarrhea.  His performance status and quality of life is unchanged.  He does not report any headaches, blurry vision or syncope.  He denies any dizziness or confusion.  He does not report any fevers or chills.  Weight and appetite remain stable.  He does not report any chest pain,  palpitation, orthopnea or leg edema.  He does not report any cough, wheezing or hemoptysis.  Denies any shortness of breath or dyspnea on exertion.  He denies any change in his bowel habits.  He does not report any flank pain, hematuria or dysuria.  He does not report any bone pain or pathological fractures.  He does not report any lymphadenopathy or petechiae.  He denies any skin rashes or lesions.  He does not report any anxiety or depression.  He denies any ecchymosis or petechiae.  Remainder of his review of systems is negative.  Medications: I have reviewed the patient's current medications.   Current Outpatient Medications  Medication Sig Dispense Refill  . acetaminophen (TYLENOL) 325 MG tablet Take 650 mg by mouth every 6 (six) hours as needed.    . dronabinol (MARINOL) 2.5 MG capsule Take 1 capsule (2.5 mg total) by mouth 2 (two) times daily before a meal. 60 capsule 0  . megestrol (MEGACE) 400 MG/10ML suspension Take 10 mLs (400 mg total) by mouth 2 (two) times daily. 240 mL 0  . prochlorperazine (COMPAZINE) 10 MG tablet Take 1 tablet (10 mg total) by mouth every 6 (six) hours as needed for nausea or vomiting. 30 tablet 0   No current facility-administered medications for this visit.    Facility-Administered Medications Ordered in Other Visits  Medication Dose Route Frequency Provider Last Rate Last Dose  . heparin lock flush 100 unit/mL  500 Units Intracatheter Once Wyatt Portela, MD      .  sodium chloride flush (NS) 0.9 % injection 10 mL  10 mL Intracatheter Once Wyatt Portela, MD      . sodium chloride flush (NS) 0.9 % injection 10 mL  10 mL Intracatheter PRN Wyatt Portela, MD   10 mL at 09/30/18 1236     Allergies:  Allergies  Allergen Reactions  . Aspirin Hives    Past Medical History, Surgical history, Social history, and Family History were reviewed and updated.    Physical Exam:  Blood pressure 122/74, pulse 81, temperature 98.6 F (37 C), temperature source  Oral, resp. rate 17, weight 134 lb (60.8 kg), SpO2 99 %.    ECOG: 1    General appearance: Alert, awake without any distress. Head: Atraumatic without abnormalities Oropharynx: Without any thrush or ulcers. Eyes: No scleral icterus. Lymph nodes: No lymphadenopathy noted in the cervical, supraclavicular, or axillary nodes Heart:regular rate and rhythm, without any murmurs or gallops.   Lung: Clear to auscultation without any rhonchi, wheezes or dullness to percussion. Abdomin: Soft, nontender without any shifting dullness or ascites. Musculoskeletal: No clubbing or cyanosis. Neurological: No motor or sensory deficits. Skin: No rashes or lesions.  Port-A-Cath remains without any erythema or induration.    Lab Results: Lab Results  Component Value Date   WBC 4.7 10/21/2018   HGB 10.5 (L) 10/21/2018   HCT 32.5 (L) 10/21/2018   MCV 82.3 10/21/2018   PLT 197 10/21/2018     Chemistry      Component Value Date/Time   NA 142 09/30/2018 0914   NA 142 12/17/2017 1303   K 3.5 09/30/2018 0914   K 3.2 (L) 12/17/2017 1303   CL 106 09/30/2018 0914   CO2 25 09/30/2018 0914   CO2 28 12/17/2017 1303   BUN 9 09/30/2018 0914   BUN 14.0 12/17/2017 1303   CREATININE 0.80 09/30/2018 0914   CREATININE 1.1 12/17/2017 1303      Component Value Date/Time   CALCIUM 9.3 09/30/2018 0914   CALCIUM 9.0 12/17/2017 1303   ALKPHOS 101 09/30/2018 0914   ALKPHOS 85 12/17/2017 1303   AST 21 09/30/2018 0914   AST 12 12/17/2017 1303   ALT 23 09/30/2018 0914   ALT 15 12/17/2017 1303   BILITOT 0.7 09/30/2018 0914   BILITOT 0.41 12/17/2017 1303     Results for [name] Johnston, [name] Johnston (MRN [id number]) as of 10/21/2018 08:27  Ref. Range 08/19/2018 08:19 09/09/2018 07:39 09/30/2018 09:14  Prostate Specific Ag, Serum Latest Ref Range: 0.0 - 4.0 ng/mL 9.6 (H) 3.3 2.2     Impression and Plan:  66 year old man with:  1.  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  He is currently receiving Jevtana chemotherapy without any major complications.  His PSA continues to show excellent response to therapy currently declining to 2.2 with no clinical signs or symptoms of disease progression.  Risks and benefits of continuing this therapy was reviewed today.  Long-term complications were assessed which includes worsening diarrhea, neutropenia and neuropathy.  After discussion today he is agreeable to continue.  He will proceed with chemotherapy today and in 3 weeks and we will schedule him for cycle 7 in December.   2.  Hepatic metastasis: Imaging studies document of the presence of hepatic metastasis.  We will continue to monitor those with imaging studies as well as a PSA.  3.  IV access: Port-A-Cath remains in use without any issues or complications.  4.  Left ureteral obstruction  related to prostate cancer: He is status post cystoscopy and double-J stent placement on October 11, 2018.  5. Prognosis: His disease is incurable but his performance status remains excellent and aggressive therapy remains warranted.  6.  Anemia: Related to malignancy and chemotherapy.  Hemoglobin stable and does not require any intervention at this time.  7.  Androgen depravation: His testosterone is currently at a castrate level after orchiectomy.  He does not require any additional androgen deprivation.  8.  Anorexia: He is eating better and have gained weight this time.  We will continue to monitor his weight moving forward.  9.  Antiemetics: Compazine is available to him at this time.  No issues with nausea and vomiting.  10. Follow-up: Will be every 3 weeks for evaluation and chemotherapy.  25  minutes was spent with the patient face-to-face today.  More than 50% of time was dedicated to discussing his disease status, treatment options and coordinating plan of care.    Zola Button, MD 11/1/20199:12 AM

## 2018-10-21 NOTE — Patient Instructions (Signed)
[facility] Discharge Instructions for Patients Receiving Chemotherapy  Today you received the following chemotherapy agents: Cabazitaxel (Jevtana)  To help prevent nausea and vomiting after your treatment, we encourage you to take your nausea medication  as prescribed.    If you develop nausea and vomiting that is not controlled by your nausea medication, call the clinic.   BELOW ARE SYMPTOMS THAT SHOULD BE REPORTED IMMEDIATELY:  *FEVER GREATER THAN 100.5 F  *CHILLS WITH OR WITHOUT FEVER  NAUSEA AND VOMITING THAT IS NOT CONTROLLED WITH YOUR NAUSEA MEDICATION  *UNUSUAL SHORTNESS OF BREATH  *UNUSUAL BRUISING OR BLEEDING  TENDERNESS IN MOUTH AND THROAT WITH OR WITHOUT PRESENCE OF ULCERS  *URINARY PROBLEMS  *BOWEL PROBLEMS  UNUSUAL RASH Items with * indicate a potential emergency and should be followed up as soon as possible.  Feel free to call the clinic should you have any questions or concerns. The clinic phone number is [phone].  Please show the CHEMO ALERT CARD at check-in to the Emergency Department and triage nurse.   

## 2018-10-22 ENCOUNTER — Inpatient Hospital Stay: Payer: PPO

## 2018-10-22 VITALS — BP 143/75 | HR 89 | Temp 97.6°F | Resp 18

## 2018-10-22 DIAGNOSIS — Z5111 Encounter for antineoplastic chemotherapy: Secondary | ICD-10-CM | POA: Diagnosis not present

## 2018-10-22 DIAGNOSIS — C61 Malignant neoplasm of prostate: Secondary | ICD-10-CM

## 2018-10-22 LAB — PROSTATE-SPECIFIC AG, SERUM (LABCORP): Prostate Specific Ag, Serum: 2 ng/mL (ref 0.0–4.0)

## 2018-10-22 MED ORDER — PEGFILGRASTIM-CBQV 6 MG/0.6ML ~~LOC~~ SOSY
PREFILLED_SYRINGE | SUBCUTANEOUS | Status: AC
  Filled 2018-10-22: qty 0.6

## 2018-10-22 MED ORDER — PEGFILGRASTIM-CBQV 6 MG/0.6ML ~~LOC~~ SOSY
6.0000 mg | PREFILLED_SYRINGE | Freq: Once | SUBCUTANEOUS | Status: AC
  Administered 2018-10-22: 6 mg via SUBCUTANEOUS

## 2018-10-22 NOTE — Patient Instructions (Signed)
Pegfilgrastim injection What is this medicine? PEGFILGRASTIM (PEG fil gra stim) is a long-acting granulocyte colony-stimulating factor that stimulates the growth of neutrophils, a type of white blood cell important in the body's fight against infection. It is used to reduce the incidence of fever and infection in patients with certain types of cancer who are receiving chemotherapy that affects the bone marrow, and to increase survival after being exposed to high doses of radiation. This medicine may be used for other purposes; ask your health care provider or pharmacist if you have questions. COMMON BRAND NAME(S): Neulasta, Udencya What should I tell my health care provider before I take this medicine? They need to know if you have any of these conditions: -kidney disease -latex allergy -ongoing radiation therapy -sickle cell disease -skin reactions to acrylic adhesives (On-Body Injector only) -an unusual or allergic reaction to pegfilgrastim, filgrastim, other medicines, foods, dyes, or preservatives -pregnant or trying to get pregnant -breast-feeding How should I use this medicine? This medicine is for injection under the skin. If you get this medicine at home, you will be taught how to prepare and give the pre-filled syringe or how to use the On-body Injector. Refer to the patient Instructions for Use for detailed instructions. Use exactly as directed. Tell your healthcare provider immediately if you suspect that the On-body Injector may not have performed as intended or if you suspect the use of the On-body Injector resulted in a missed or partial dose. It is important that you put your used needles and syringes in a special sharps container. Do not put them in a trash can. If you do not have a sharps container, call your pharmacist or healthcare provider to get one. Talk to your pediatrician regarding the use of this medicine in children. While this drug may be prescribed for selected  conditions, precautions do apply. Overdosage: If you think you have taken too much of this medicine contact a poison control center or emergency room at once. NOTE: This medicine is only for you. Do not share this medicine with others. What if I miss a dose? It is important not to miss your dose. Call your doctor or health care professional if you miss your dose. If you miss a dose due to an On-body Injector failure or leakage, a new dose should be administered as soon as possible using a single prefilled syringe for manual use. What may interact with this medicine? Interactions have not been studied. Give your health care provider a list of all the medicines, herbs, non-prescription drugs, or dietary supplements you use. Also tell them if you smoke, drink alcohol, or use illegal drugs. Some items may interact with your medicine. This list may not describe all possible interactions. Give your health care provider a list of all the medicines, herbs, non-prescription drugs, or dietary supplements you use. Also tell them if you smoke, drink alcohol, or use illegal drugs. Some items may interact with your medicine. What should I watch for while using this medicine? You may need blood work done while you are taking this medicine. If you are going to need a MRI, CT scan, or other procedure, tell your doctor that you are using this medicine (On-Body Injector only). What side effects may I notice from receiving this medicine? Side effects that you should report to your doctor or health care professional as soon as possible: -allergic reactions like skin rash, itching or hives, swelling of the face, lips, or tongue -dizziness -fever -pain, redness, or irritation at   site where injected -pinpoint red spots on the skin -red or dark-brown urine -shortness of breath or breathing problems -stomach or side pain, or pain at the shoulder -swelling -tiredness -trouble passing urine or change in the amount of  urine Side effects that usually do not require medical attention (report to your doctor or health care professional if they continue or are bothersome): -bone pain -muscle pain This list may not describe all possible side effects. Call your doctor for medical advice about side effects. You may report side effects to FDA at 1-800-FDA-1088. Where should I keep my medicine? Keep out of the reach of children. Store pre-filled syringes in a refrigerator between 2 and 8 degrees C (36 and 46 degrees F). Do not freeze. Keep in carton to protect from light. Throw away this medicine if it is left out of the refrigerator for more than 48 hours. Throw away any unused medicine after the expiration date. NOTE: This sheet is a summary. It may not cover all possible information. If you have questions about this medicine, talk to your doctor, pharmacist, or health care provider.  2018 [employer] (2016-12-03 12:58:03)  

## 2018-11-01 DIAGNOSIS — R300 Dysuria: Secondary | ICD-10-CM | POA: Diagnosis not present

## 2018-11-01 DIAGNOSIS — R3911 Hesitancy of micturition: Secondary | ICD-10-CM | POA: Diagnosis not present

## 2018-11-01 DIAGNOSIS — C61 Malignant neoplasm of prostate: Secondary | ICD-10-CM | POA: Diagnosis not present

## 2018-11-01 DIAGNOSIS — R350 Frequency of micturition: Secondary | ICD-10-CM | POA: Diagnosis not present

## 2018-11-02 IMAGING — CR DG CHEST 2V
2 series · 2 of 2 positions shown · non-contrast
Comparison: 05/08/2013

CLINICAL DATA: Exertional lightheadedness.

EXAM:
CHEST  2 VIEW

[chest pa]
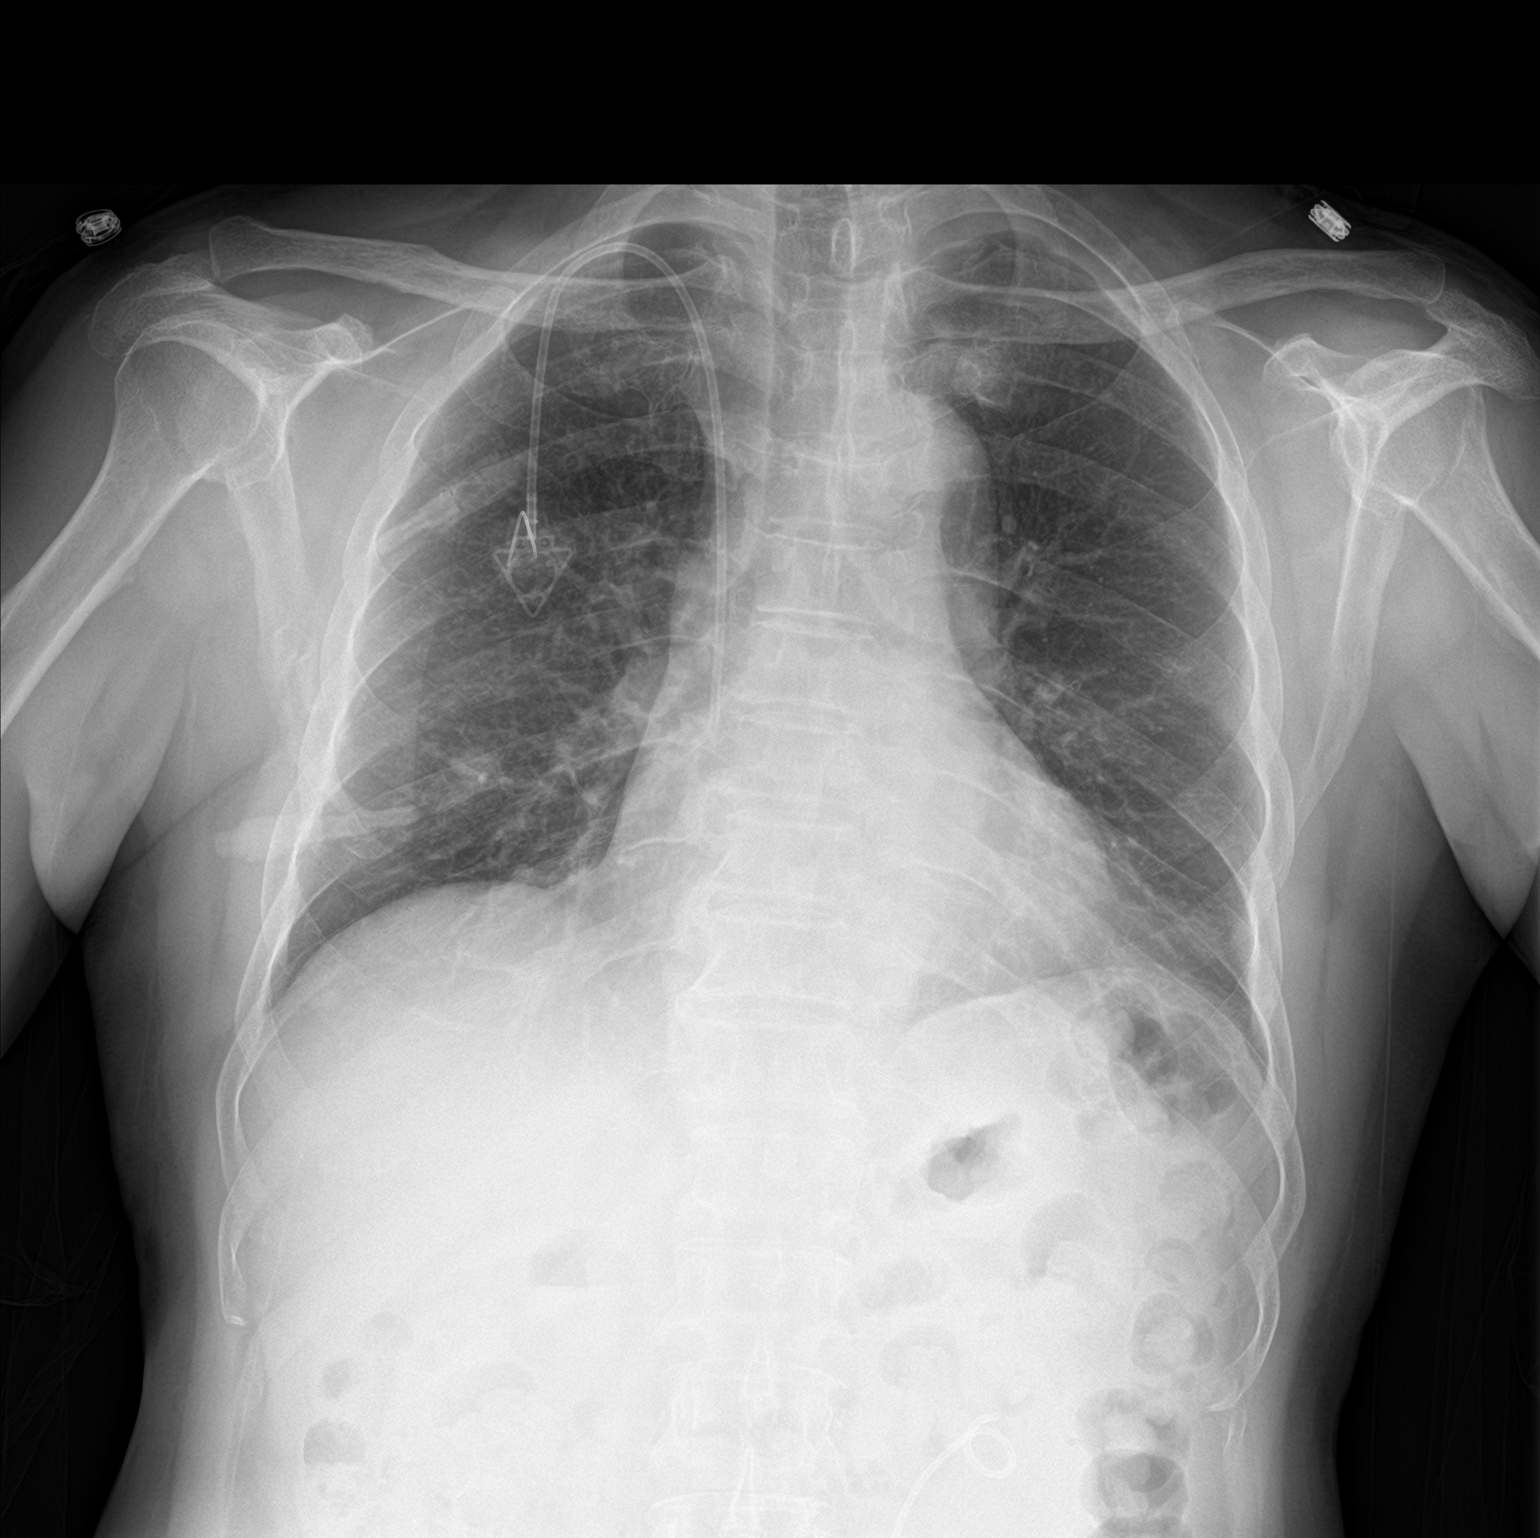

[chest lat]
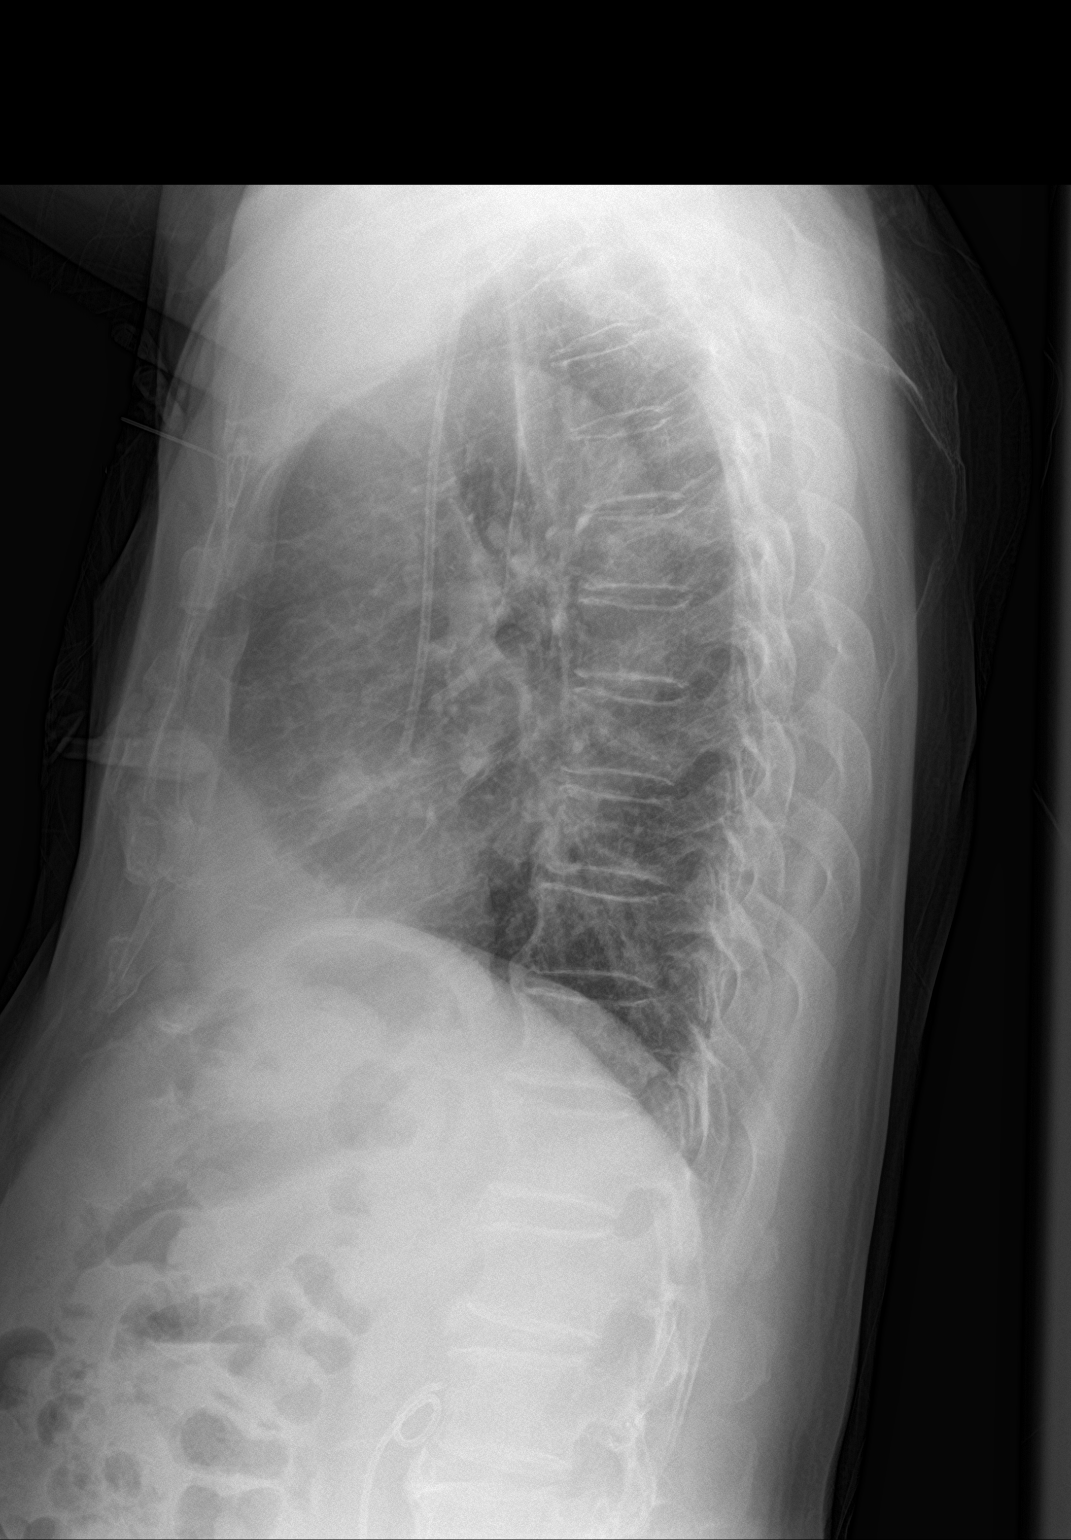

[2 of 2 positions shown; findings below may reference images not displayed]

FINDINGS: Tip of the accessed left chest port in the mid SVC. Heart is normal
in size. Mild aortic tortuosity. Low lung volumes. No consolidation,
pleural effusion or pneumothorax. Mild chronic left
pleuroparenchymal thickening. No pulmonary edema. No acute osseous
abnormality.
IMPRESSION: Low lung volumes without acute abnormality or explanation for
exertional lightheadedness.

## 2018-11-11 ENCOUNTER — Inpatient Hospital Stay: Payer: PPO

## 2018-11-11 ENCOUNTER — Telehealth: Payer: Self-pay

## 2018-11-11 ENCOUNTER — Inpatient Hospital Stay (HOSPITAL_BASED_OUTPATIENT_CLINIC_OR_DEPARTMENT_OTHER): Payer: PPO | Admitting: Oncology

## 2018-11-11 VITALS — BP 133/69 | HR 89 | Temp 98.3°F | Resp 18 | Ht 62.0 in | Wt 133.9 lb

## 2018-11-11 DIAGNOSIS — Z5111 Encounter for antineoplastic chemotherapy: Secondary | ICD-10-CM | POA: Diagnosis not present

## 2018-11-11 DIAGNOSIS — E291 Testicular hypofunction: Secondary | ICD-10-CM

## 2018-11-11 DIAGNOSIS — G629 Polyneuropathy, unspecified: Secondary | ICD-10-CM | POA: Diagnosis not present

## 2018-11-11 DIAGNOSIS — C787 Secondary malignant neoplasm of liver and intrahepatic bile duct: Secondary | ICD-10-CM | POA: Diagnosis not present

## 2018-11-11 DIAGNOSIS — C61 Malignant neoplasm of prostate: Secondary | ICD-10-CM

## 2018-11-11 DIAGNOSIS — Z95828 Presence of other vascular implants and grafts: Secondary | ICD-10-CM

## 2018-11-11 DIAGNOSIS — R300 Dysuria: Secondary | ICD-10-CM | POA: Diagnosis not present

## 2018-11-11 DIAGNOSIS — C7800 Secondary malignant neoplasm of unspecified lung: Secondary | ICD-10-CM | POA: Diagnosis not present

## 2018-11-11 LAB — CBC WITH DIFFERENTIAL (CANCER CENTER ONLY)
Abs Immature Granulocytes: 0.02 10*3/uL (ref 0.00–0.07)
Basophils Absolute: 0.1 10*3/uL (ref 0.0–0.1)
Basophils Relative: 1 %
Eosinophils Absolute: 0 10*3/uL (ref 0.0–0.5)
Eosinophils Relative: 1 %
HCT: 31.2 % — ABNORMAL LOW (ref 39.0–52.0)
HEMOGLOBIN: 9.8 g/dL — AB (ref 13.0–17.0)
IMMATURE GRANULOCYTES: 0 %
LYMPHS PCT: 17 %
Lymphs Abs: 0.9 10*3/uL (ref 0.7–4.0)
MCH: 26.3 pg (ref 26.0–34.0)
MCHC: 31.4 g/dL (ref 30.0–36.0)
MCV: 83.6 fL (ref 80.0–100.0)
Monocytes Absolute: 0.5 10*3/uL (ref 0.1–1.0)
Monocytes Relative: 11 %
NEUTROS ABS: 3.6 10*3/uL (ref 1.7–7.7)
NEUTROS PCT: 70 %
Platelet Count: 219 10*3/uL (ref 150–400)
RBC: 3.73 MIL/uL — AB (ref 4.22–5.81)
RDW: 15.1 % (ref 11.5–15.5)
WBC: 5.1 10*3/uL (ref 4.0–10.5)
nRBC: 0 % (ref 0.0–0.2)

## 2018-11-11 LAB — URINALYSIS, COMPLETE (UACMP) WITH MICROSCOPIC
BILIRUBIN URINE: NEGATIVE
Glucose, UA: NEGATIVE mg/dL
KETONES UR: NEGATIVE mg/dL
NITRITE: POSITIVE — AB
PROTEIN: 100 mg/dL — AB
RBC / HPF: >50 RBC/hpf — ABNORMAL HIGH (ref 0–5)
SPECIFIC GRAVITY, URINE: 1.014 (ref 1.005–1.030)
WBC, UA: >50 WBC/hpf — ABNORMAL HIGH (ref 0–5)
pH: 6 (ref 5.0–8.0)

## 2018-11-11 LAB — CMP (CANCER CENTER ONLY)
ALT: 23 U/L (ref 0–44)
ANION GAP: 8 (ref 5–15)
AST: 20 U/L (ref 15–41)
Albumin: 3.5 g/dL (ref 3.5–5.0)
Alkaline Phosphatase: 110 U/L (ref 38–126)
BUN: 12 mg/dL (ref 8–23)
CO2: 23 mmol/L (ref 22–32)
Calcium: 8.9 mg/dL (ref 8.9–10.3)
Chloride: 111 mmol/L (ref 98–111)
Creatinine: 0.92 mg/dL (ref 0.61–1.24)
GFR, Est AFR Am: >60 mL/min (ref >60)
GFR, Estimated: >60 mL/min (ref >60)
Glucose, Bld: 104 mg/dL — ABNORMAL HIGH (ref 70–99)
POTASSIUM: 3.8 mmol/L (ref 3.5–5.1)
SODIUM: 142 mmol/L (ref 135–145)
Total Bilirubin: 0.3 mg/dL (ref 0.3–1.2)
Total Protein: 6.6 g/dL (ref 6.5–8.1)

## 2018-11-11 MED ORDER — SODIUM CHLORIDE 0.9% FLUSH
10.0000 mL | Freq: Once | INTRAVENOUS | Status: AC
  Administered 2018-11-11: 10 mL
  Filled 2018-11-11: qty 10

## 2018-11-11 MED ORDER — DEXAMETHASONE SODIUM PHOSPHATE 10 MG/ML IJ SOLN
INTRAMUSCULAR | Status: AC
  Filled 2018-11-11: qty 1

## 2018-11-11 MED ORDER — FAMOTIDINE IN NACL 20-0.9 MG/50ML-% IV SOLN
INTRAVENOUS | Status: AC
  Filled 2018-11-11: qty 50

## 2018-11-11 MED ORDER — DIPHENHYDRAMINE HCL 50 MG/ML IJ SOLN
25.0000 mg | Freq: Once | INTRAMUSCULAR | Status: AC
  Administered 2018-11-11: 25 mg via INTRAVENOUS

## 2018-11-11 MED ORDER — FAMOTIDINE IN NACL 20-0.9 MG/50ML-% IV SOLN
20.0000 mg | Freq: Once | INTRAVENOUS | Status: AC
  Administered 2018-11-11: 20 mg via INTRAVENOUS

## 2018-11-11 MED ORDER — SODIUM CHLORIDE 0.9 % IV SOLN
20.0000 mg/m2 | Freq: Once | INTRAVENOUS | Status: AC
  Administered 2018-11-11: 33 mg via INTRAVENOUS
  Filled 2018-11-11: qty 3.3

## 2018-11-11 MED ORDER — SODIUM CHLORIDE 0.9 % IV SOLN
Freq: Once | INTRAVENOUS | Status: AC
  Administered 2018-11-11: 09:00:00 via INTRAVENOUS
  Filled 2018-11-11: qty 250

## 2018-11-11 MED ORDER — DEXAMETHASONE SODIUM PHOSPHATE 10 MG/ML IJ SOLN
10.0000 mg | Freq: Once | INTRAMUSCULAR | Status: AC
  Administered 2018-11-11: 10 mg via INTRAVENOUS

## 2018-11-11 MED ORDER — CIPROFLOXACIN HCL 500 MG PO TABS: 500.0000 mg | ORAL_TABLET | Freq: Two times a day (BID) | ORAL | 0 refills | Status: DC

## 2018-11-11 MED ORDER — SODIUM CHLORIDE 0.9% FLUSH
10.0000 mL | INTRAVENOUS | Status: DC | PRN
  Administered 2018-11-11: 10 mL
  Filled 2018-11-11: qty 10

## 2018-11-11 MED ORDER — HEPARIN SOD (PORK) LOCK FLUSH 100 UNIT/ML IV SOLN
500.0000 [IU] | Freq: Once | INTRAVENOUS | Status: AC | PRN
  Administered 2018-11-11: 500 [IU]
  Filled 2018-11-11: qty 5

## 2018-11-11 MED ORDER — DIPHENHYDRAMINE HCL 50 MG/ML IJ SOLN
INTRAMUSCULAR | Status: AC
  Filled 2018-11-11: qty 1

## 2018-11-11 MED ORDER — DEXTROSE 5 % IV SOLN
20.0000 mg/m2 | Freq: Once | INTRAVENOUS | Status: DC
  Filled 2018-11-11: qty 3.3

## 2018-11-11 NOTE — Telephone Encounter (Signed)
Printed avs and calender of upcoming appointment. Per 11/22 los

## 2018-11-11 NOTE — Addendum Note (Signed)
Addended by: Wyatt Portela on: 11/11/2018 10:31 AM   Modules accepted: Orders

## 2018-11-11 NOTE — Progress Notes (Signed)
Hematology and Oncology Follow Up Visit  [name] Johnston [id number] [birth year] 67 y.o. 11/11/2018 8:30 AM [name], MDDewey, [name], MD   Principle Diagnosis: 67 year old man with advanced prostate cancer with disease to the liver and lung diagnosed in 2016.  He was initially initial diagnosed in 2014 with Gleason score 8 and a PSA 40.  He has castration-resistant disease at this time.  Prior Therapy:  He is started [place] on 06/27/2013. He subsequently received definitive radiation therapy for a planned dose of 75 gray completed in November 2014 under the care of Dr. Tammi Klippel.   He developed castration resistant disease with biopsy-proven liver metastasis in May 2016.  Docetaxel 75 mg/m given every 3 weeks with Neulasta support. He is status post 10 cycles of treatment concluded on 11/21/2015. He had an excellent PSA response with PSA dropping from 222 to 0.17.   PSA started to rise again with PSA up to 8.9 in August 2017.  He is status post orchiectomy completed in February 2019.  Zytiga 1000 mg with prednisone since September 2017.    Current therapy: Jevtana chemotherapy given at 20 mg/m every 3 weeks cycle 1 started on 07/29/2018.  He is here for cycle 6 of therapy.  Interim History:  [name] Johnston presents today for a follow-up.  Since the last visit, he received last cycle of chemotherapy without any major complications.  He does report decrease in his appetite slightly but improves spontaneously few days after chemotherapy.  He denies any nausea, vomiting or worsening neuropathy.  He does report some dysuria and occasional hematuria.  He was prescribed Azo but symptoms did not improve.  His performance status and quality of life remains unchanged.  He does not report any headaches, blurry vision or syncope.  He denies any alteration in mental status or lethargy.  He does not report any fevers or chills. He does not report any chest pain, palpitation, orthopnea or leg edema.  He  does not report any cough, wheezing or hemoptysis.   He denies any constipation or diarrhea.  He does not report any flank pain, frequency or urgency.  He does not report any arthralgias or myalgias.  He does not report any lymphadenopathy or petechiae.  Denies any bleeding or clotting tendency.  He does not report any changes in his mood. Remainder of his review of systems is negative.  Medications: I have reviewed the patient's current medications.   Current Outpatient Medications  Medication Sig Dispense Refill  . acetaminophen (TYLENOL) 325 MG tablet Take 650 mg by mouth every 6 (six) hours as needed.    . dronabinol (MARINOL) 2.5 MG capsule Take 1 capsule (2.5 mg total) by mouth 2 (two) times daily before a meal. 60 capsule 0  . megestrol (MEGACE) 400 MG/10ML suspension Take 10 mLs (400 mg total) by mouth 2 (two) times daily. 240 mL 0  . prochlorperazine (COMPAZINE) 10 MG tablet Take 1 tablet (10 mg total) by mouth every 6 (six) hours as needed for nausea or vomiting. 30 tablet 0   No current facility-administered medications for this visit.    Facility-Administered Medications Ordered in Other Visits  Medication Dose Route Frequency Provider Last Rate Last Dose  . heparin lock flush 100 unit/mL  500 Units Intracatheter Once Wyatt Portela, MD      . sodium chloride flush (NS) 0.9 % injection 10 mL  10 mL Intracatheter Once Wyatt Portela, MD      . sodium chloride flush (NS) 0.9 %  injection 10 mL  10 mL Intracatheter PRN Wyatt Portela, MD   10 mL at 09/30/18 1236     Allergies:  Allergies  Allergen Reactions  . Aspirin Hives    Past Medical History, Surgical history, Social history, and Family History were reviewed and updated.    Physical Exam:   Blood pressure 133/69, pulse 89, temperature 98.3 F (36.8 C), temperature source Oral, resp. rate 18, height 5\' 2"  (1.575 m), weight 133 lb 14.4 oz (60.7 kg), SpO2 99 %.    ECOG: 1    General appearance: Comfortable  appearing without any discomfort Head: Normocephalic without any trauma Oropharynx: Mucous membranes are moist and pink without any thrush or ulcers. Eyes: Pupils are equal and round reactive to light. Lymph nodes: No cervical, supraclavicular, inguinal or axillary lymphadenopathy.   Heart:regular rate and rhythm.  S1 and S2 without leg edema. Lung: Clear without any rhonchi or wheezes.  No dullness to percussion. Abdomin: Soft, nontender, nondistended with good bowel sounds.  No hepatosplenomegaly. Musculoskeletal: No joint deformity or effusion.  Full range of motion noted. Neurological: No deficits noted on motor, sensory and deep tendon reflex exam. Skin: No petechial rash or dryness.  Appeared moist.  Psychiatric: Mood and affect appeared appropriate.      Lab Results: Lab Results  Component Value Date   WBC 4.7 10/21/2018   HGB 10.5 (L) 10/21/2018   HCT 32.5 (L) 10/21/2018   MCV 82.3 10/21/2018   PLT 197 10/21/2018     Chemistry      Component Value Date/Time   NA 141 10/21/2018 0833   NA 142 12/17/2017 1303   K 4.0 10/21/2018 0833   K 3.2 (L) 12/17/2017 1303   CL 107 10/21/2018 0833   CO2 25 10/21/2018 0833   CO2 28 12/17/2017 1303   BUN 20 10/21/2018 0833   BUN 14.0 12/17/2017 1303   CREATININE 0.90 10/21/2018 0833   CREATININE 1.1 12/17/2017 1303      Component Value Date/Time   CALCIUM 9.4 10/21/2018 0833   CALCIUM 9.0 12/17/2017 1303   ALKPHOS 111 10/21/2018 0833   ALKPHOS 85 12/17/2017 1303   AST 17 10/21/2018 0833   AST 12 12/17/2017 1303   ALT 18 10/21/2018 0833   ALT 15 12/17/2017 1303   BILITOT 0.6 10/21/2018 0833   BILITOT 0.41 12/17/2017 1303       Results for LEVEON, [name] Johnston (MRN [id number]) as of 11/11/2018 08:20  Ref. Range 08/19/2018 08:19 09/09/2018 07:39 09/30/2018 09:14 10/21/2018 08:33  Prostate Specific Ag, Serum Latest Ref Range: 0.0 - 4.0 ng/mL 9.6 (H) 3.3 2.2 2.0    Impression and Plan:  67 year old man with:  1.  Advanced  prostate cancer that is currently castration-resistant with  disease to the liver and the lung.  He is currently receiving systemic chemotherapy with reasonable response and tolerance.   The natural course of this disease was discussed again and risks and benefits of continuing chemotherapy was reviewed.  His PSA continues to show reasonable response currently at 2.0.  Complication associated with this medication long-term including excessive fatigue, tiredness and peripheral neuropathy.  After discussion today he is agreeable to continue at this time.   2.  Hepatic metastasis: We will repeat imaging studies upon conclusion of chemotherapy to assess his response.  3.  IV access: No issues or complaints reported to his Port-A-Cath use.  4.  Left ureteral obstruction: Stent was placed on October 11, 2018.  His obstruction is related to malignancy.  5. Prognosis: Therapy remains palliative at this time although aggressive therapy is warranted given his excellent performance status.  6.  Anemia: Hemoglobin is relatively stable without any need for transfusion.  Anemia is related to malignancy.  7.  Androgen depravation: He status post orchiectomy with testosterone at a castrate level.  8.  Dysuria: Likely related to his stent placement although urinary tract infection is a possibility.  He has been prescribed Azo without any improvement in his symptoms.  I will obtain urine analysis and culture and treat accordingly.  9.  Antiemetics: No issues reported with nausea or vomiting.  Antiemetics are available to him.  10. Follow-up: Will be every 3 weeks for the next cycle of chemotherapy.  25  minutes was spent with the patient face-to-face today.  More than 50% of time was dedicated to reviewing the natural course of his disease, treatment options complications related to therapy.    Zola Button, MD 11/22/20198:30 AM

## 2018-11-11 NOTE — Patient Instructions (Signed)
[facility] Discharge Instructions for Patients Receiving Chemotherapy  Today you received the following chemotherapy agents: Cabazitaxel (Jevtana)  To help prevent nausea and vomiting after your treatment, we encourage you to take your nausea medication  as prescribed.    If you develop nausea and vomiting that is not controlled by your nausea medication, call the clinic.   BELOW ARE SYMPTOMS THAT SHOULD BE REPORTED IMMEDIATELY:  *FEVER GREATER THAN 100.5 F  *CHILLS WITH OR WITHOUT FEVER  NAUSEA AND VOMITING THAT IS NOT CONTROLLED WITH YOUR NAUSEA MEDICATION  *UNUSUAL SHORTNESS OF BREATH  *UNUSUAL BRUISING OR BLEEDING  TENDERNESS IN MOUTH AND THROAT WITH OR WITHOUT PRESENCE OF ULCERS  *URINARY PROBLEMS  *BOWEL PROBLEMS  UNUSUAL RASH Items with * indicate a potential emergency and should be followed up as soon as possible.  Feel free to call the clinic should you have any questions or concerns. The clinic phone number is [phone].  Please show the CHEMO ALERT CARD at check-in to the Emergency Department and triage nurse.   

## 2018-11-12 ENCOUNTER — Inpatient Hospital Stay: Payer: PPO

## 2018-11-12 VITALS — BP 150/80 | HR 101 | Temp 98.4°F | Resp 18

## 2018-11-12 DIAGNOSIS — Z5111 Encounter for antineoplastic chemotherapy: Secondary | ICD-10-CM | POA: Diagnosis not present

## 2018-11-12 LAB — URINE CULTURE: CULTURE: NO GROWTH

## 2018-11-12 LAB — PROSTATE-SPECIFIC AG, SERUM (LABCORP): Prostate Specific Ag, Serum: 1.7 ng/mL (ref 0.0–4.0)

## 2018-11-12 MED ORDER — PEGFILGRASTIM-CBQV 6 MG/0.6ML ~~LOC~~ SOSY
PREFILLED_SYRINGE | SUBCUTANEOUS | Status: AC
  Filled 2018-11-12: qty 0.6

## 2018-11-12 MED ORDER — PEGFILGRASTIM-CBQV 6 MG/0.6ML ~~LOC~~ SOSY
6.0000 mg | PREFILLED_SYRINGE | Freq: Once | SUBCUTANEOUS | Status: AC
  Administered 2018-11-12: 6 mg via SUBCUTANEOUS

## 2018-11-16 ENCOUNTER — Emergency Department (HOSPITAL_COMMUNITY)
Admission: EM | Admit: 2018-11-16 | Discharge: 2018-11-16 | Disposition: A | Discharge status: Home / Self Care | Payer: PPO | Attending: Emergency Medicine | Admitting: Emergency Medicine

## 2018-11-16 ENCOUNTER — Encounter (HOSPITAL_COMMUNITY): Payer: Self-pay | Admitting: *Deleted

## 2018-11-16 DIAGNOSIS — I10 Essential (primary) hypertension: Secondary | ICD-10-CM | POA: Diagnosis not present

## 2018-11-16 DIAGNOSIS — Z79899 Other long term (current) drug therapy: Secondary | ICD-10-CM | POA: Insufficient documentation

## 2018-11-16 DIAGNOSIS — R319 Hematuria, unspecified: Secondary | ICD-10-CM | POA: Diagnosis not present

## 2018-11-16 DIAGNOSIS — J45909 Unspecified asthma, uncomplicated: Secondary | ICD-10-CM | POA: Insufficient documentation

## 2018-11-16 LAB — URINALYSIS, ROUTINE W REFLEX MICROSCOPIC
Bilirubin Urine: NEGATIVE
GLUCOSE, UA: NEGATIVE mg/dL
KETONES UR: NEGATIVE mg/dL
NITRITE: POSITIVE — AB
Protein, ur: 100 mg/dL — AB
RBC / HPF: >50 RBC/hpf — ABNORMAL HIGH (ref 0–5)
Specific Gravity, Urine: 1.014 (ref 1.005–1.030)
pH: 6 (ref 5.0–8.0)

## 2018-11-16 LAB — CBC WITH DIFFERENTIAL/PLATELET
ABS IMMATURE GRANULOCYTES: 0.27 10*3/uL — AB (ref 0.00–0.07)
BASOS PCT: 1 %
Basophils Absolute: 0 10*3/uL (ref 0.0–0.1)
EOS ABS: 0.1 10*3/uL (ref 0.0–0.5)
Eosinophils Relative: 2 %
HEMATOCRIT: 29.5 % — AB (ref 39.0–52.0)
Hemoglobin: 9.4 g/dL — ABNORMAL LOW (ref 13.0–17.0)
Immature Granulocytes: 7 %
Lymphocytes Relative: 13 %
Lymphs Abs: 0.5 10*3/uL — ABNORMAL LOW (ref 0.7–4.0)
MCH: 27.3 pg (ref 26.0–34.0)
MCHC: 31.9 g/dL (ref 30.0–36.0)
MCV: 85.8 fL (ref 80.0–100.0)
MONO ABS: 0.1 10*3/uL (ref 0.1–1.0)
MONOS PCT: 3 %
Neutro Abs: 2.8 10*3/uL (ref 1.7–7.7)
Neutrophils Relative %: 74 %
Platelets: 88 10*3/uL — ABNORMAL LOW (ref 150–400)
RBC: 3.44 MIL/uL — ABNORMAL LOW (ref 4.22–5.81)
RDW: 15.5 % (ref 11.5–15.5)
WBC: 3.8 10*3/uL — ABNORMAL LOW (ref 4.0–10.5)
nRBC: 0 % (ref 0.0–0.2)

## 2018-11-16 LAB — COMPREHENSIVE METABOLIC PANEL
ALT: 44 U/L (ref 0–44)
AST: 34 U/L (ref 15–41)
Albumin: 3.6 g/dL (ref 3.5–5.0)
Alkaline Phosphatase: 111 U/L (ref 38–126)
Anion gap: 7 (ref 5–15)
BILIRUBIN TOTAL: 0.8 mg/dL (ref 0.3–1.2)
BUN: 15 mg/dL (ref 8–23)
CO2: 25 mmol/L (ref 22–32)
Calcium: 8.4 mg/dL — ABNORMAL LOW (ref 8.9–10.3)
Chloride: 105 mmol/L (ref 98–111)
Creatinine, Ser: 0.77 mg/dL (ref 0.61–1.24)
Glucose, Bld: 103 mg/dL — ABNORMAL HIGH (ref 70–99)
POTASSIUM: 3.3 mmol/L — AB (ref 3.5–5.1)
Sodium: 137 mmol/L (ref 135–145)
TOTAL PROTEIN: 5.9 g/dL — AB (ref 6.5–8.1)

## 2018-11-16 MED ORDER — TRAMADOL HCL 50 MG PO TABS: 50.0000 mg | ORAL_TABLET | Freq: Four times a day (QID) | ORAL | 0 refills | Status: DC | PRN

## 2018-11-16 NOTE — ED Triage Notes (Signed)
Pt complains of lower abdominal pain x 1 month, worse when urinating. Pt has left ureteral stent.
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
## 2018-11-16 NOTE — Discharge Instructions (Signed)
Call [facility] and make an appointment for next week.  Tell them you are in the emergency department today in the emergency physician felt like you should be seen next week

## 2018-11-18 LAB — URINE CULTURE
Culture: NO GROWTH
SPECIAL REQUESTS: NORMAL

## 2018-11-25 ENCOUNTER — Emergency Department (HOSPITAL_COMMUNITY): Payer: PPO

## 2018-11-25 ENCOUNTER — Emergency Department (HOSPITAL_COMMUNITY)
Admission: EM | Admit: 2018-11-25 | Discharge: 2018-11-25 | Disposition: A | Discharge status: Home / Self Care | Payer: PPO | Attending: Emergency Medicine | Admitting: Emergency Medicine

## 2018-11-25 ENCOUNTER — Encounter (HOSPITAL_COMMUNITY): Payer: Self-pay | Admitting: *Deleted

## 2018-11-25 DIAGNOSIS — E785 Hyperlipidemia, unspecified: Secondary | ICD-10-CM | POA: Insufficient documentation

## 2018-11-25 DIAGNOSIS — J45909 Unspecified asthma, uncomplicated: Secondary | ICD-10-CM | POA: Diagnosis not present

## 2018-11-25 DIAGNOSIS — Z79899 Other long term (current) drug therapy: Secondary | ICD-10-CM | POA: Diagnosis not present

## 2018-11-25 DIAGNOSIS — C787 Secondary malignant neoplasm of liver and intrahepatic bile duct: Secondary | ICD-10-CM | POA: Diagnosis not present

## 2018-11-25 DIAGNOSIS — R1030 Lower abdominal pain, unspecified: Secondary | ICD-10-CM | POA: Insufficient documentation

## 2018-11-25 DIAGNOSIS — I10 Essential (primary) hypertension: Secondary | ICD-10-CM | POA: Insufficient documentation

## 2018-11-25 DIAGNOSIS — Z8546 Personal history of malignant neoplasm of prostate: Secondary | ICD-10-CM | POA: Insufficient documentation

## 2018-11-25 DIAGNOSIS — R109 Unspecified abdominal pain: Secondary | ICD-10-CM

## 2018-11-25 LAB — URINALYSIS, ROUTINE W REFLEX MICROSCOPIC
Bilirubin Urine: NEGATIVE
Glucose, UA: NEGATIVE mg/dL
Ketones, ur: NEGATIVE mg/dL
Nitrite: NEGATIVE
Protein, ur: 100 mg/dL — AB
RBC / HPF: >50 RBC/hpf — ABNORMAL HIGH (ref 0–5)
Specific Gravity, Urine: 1.012 (ref 1.005–1.030)
pH: 7 (ref 5.0–8.0)

## 2018-11-25 LAB — CBC
HCT: 35.9 % — ABNORMAL LOW (ref 39.0–52.0)
Hemoglobin: 10.5 g/dL — ABNORMAL LOW (ref 13.0–17.0)
MCH: 25 pg — ABNORMAL LOW (ref 26.0–34.0)
MCHC: 29.2 g/dL — ABNORMAL LOW (ref 30.0–36.0)
MCV: 85.5 fL (ref 80.0–100.0)
Platelets: 277 10*3/uL (ref 150–400)
RBC: 4.2 MIL/uL — ABNORMAL LOW (ref 4.22–5.81)
RDW: 15.8 % — ABNORMAL HIGH (ref 11.5–15.5)
WBC: 8 10*3/uL (ref 4.0–10.5)
nRBC: 0 % (ref 0.0–0.2)

## 2018-11-25 LAB — COMPREHENSIVE METABOLIC PANEL
ALT: 47 U/L — ABNORMAL HIGH (ref 0–44)
AST: 37 U/L (ref 15–41)
Albumin: 3.4 g/dL — ABNORMAL LOW (ref 3.5–5.0)
Alkaline Phosphatase: 123 U/L (ref 38–126)
Anion gap: 12 (ref 5–15)
BUN: 10 mg/dL (ref 8–23)
CO2: 22 mmol/L (ref 22–32)
Calcium: 9.1 mg/dL (ref 8.9–10.3)
Chloride: 107 mmol/L (ref 98–111)
Creatinine, Ser: 1.2 mg/dL (ref 0.61–1.24)
GFR calc Af Amer: >60 mL/min (ref >60)
GFR calc non Af Amer: >60 mL/min (ref >60)
Glucose, Bld: 118 mg/dL — ABNORMAL HIGH (ref 70–99)
Potassium: 4 mmol/L (ref 3.5–5.1)
Sodium: 141 mmol/L (ref 135–145)
Total Bilirubin: 0.5 mg/dL (ref 0.3–1.2)
Total Protein: 6.6 g/dL (ref 6.5–8.1)

## 2018-11-25 LAB — LIPASE, BLOOD: Lipase: 29 U/L (ref 11–51)

## 2018-11-25 MED ORDER — OXYCODONE-ACETAMINOPHEN 5-325 MG PO TABS: 1.0000 | ORAL_TABLET | Freq: Four times a day (QID) | ORAL | 0 refills | Status: DC | PRN

## 2018-11-25 MED ORDER — HYDROMORPHONE HCL 1 MG/ML IJ SOLN
1.0000 mg | Freq: Once | INTRAMUSCULAR | Status: AC
  Administered 2018-11-25: 1 mg via INTRAVENOUS
  Filled 2018-11-25: qty 1

## 2018-11-25 MED ORDER — KETOROLAC TROMETHAMINE 15 MG/ML IJ SOLN
15.0000 mg | Freq: Once | INTRAMUSCULAR | Status: AC
  Administered 2018-11-25: 15 mg via INTRAVENOUS
  Filled 2018-11-25: qty 1

## 2018-11-25 MED ORDER — IOHEXOL 300 MG/ML  SOLN
100.0000 mL | Freq: Once | INTRAMUSCULAR | Status: AC | PRN
  Administered 2018-11-25: 100 mL via INTRAVENOUS

## 2018-11-25 MED ORDER — DOCUSATE SODIUM 100 MG PO CAPS: 100.0000 mg | ORAL_CAPSULE | Freq: Two times a day (BID) | ORAL | 0 refills | Status: DC | PRN

## 2018-11-25 NOTE — ED Notes (Signed)
Patient ambulatory to bathroom with steady gait at this time 
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
## 2018-11-25 NOTE — ED Notes (Signed)
Patient transported to CT 

## 2018-11-25 NOTE — ED Triage Notes (Signed)
Pt in c/o RLQ abd pain and hematuria, pt being treated for prostate cancer that has went to the Liver, pt denies n/v/d, pt reports worse pain after eating, A&O x4

## 2018-11-25 NOTE — ED Notes (Signed)
Pt verbalized understanding of discharge paperwork and follow-up care.  °

## 2018-11-27 LAB — URINE CULTURE: Culture: NO GROWTH

## 2018-11-30 ENCOUNTER — Telehealth: Payer: Self-pay | Admitting: *Deleted

## 2018-11-30 ENCOUNTER — Ambulatory Visit: Payer: PPO

## 2018-11-30 ENCOUNTER — Encounter: Payer: PPO | Admitting: Medical

## 2018-11-30 DIAGNOSIS — C61 Malignant neoplasm of prostate: Secondary | ICD-10-CM

## 2018-11-30 DIAGNOSIS — R300 Dysuria: Secondary | ICD-10-CM

## 2018-11-30 DIAGNOSIS — R5383 Other fatigue: Secondary | ICD-10-CM

## 2018-11-30 NOTE — Telephone Encounter (Signed)
No show for today's [facility] visit.  Messages left on patient and daughter's voicemails requesting return call with status update.  Report to ER for any distress.  F/U Friday 12-02-2018 registration at 10:15 am for lab/flush/MD and infusion scheduled appointments.  Awaiting return call from patient and family due to language barrier.

## 2018-11-30 NOTE — Telephone Encounter (Signed)
"  [name] Johnston's daughter [name] [phone]) calling.  Dad needs to be seen.  He's crying in pain.  Ongoing pain to right abdomen and it hurts when he urinates.  Lately pain is getting worse.  ED ordered tramadol on 11-16-2018 which did not help.  11-25-2018 ED ordered Percocet.  Took the last one at 8:00 am.  He's tired; doesn't think his body can tolerate treatment Friday he's so weak.  Can't tolerate sitting or standing.  He's in bed having become more immobile."

## 2018-11-30 NOTE — Telephone Encounter (Signed)
CT Abd/Pelvis performed in ED 11-25-2018.  Verbal order received and read back from Dr. Alen Blew for [name] Johnston to come in for [facility] today if able.  Order given to daughter Clarise Cruz at this time.  Clarise Cruz says "Dad is able to come in.  Will he need to come in Friday?"  Future appointments will be determined with today's visit.  Instructed to arrive at 12:00 pm for registration process.

## 2018-12-02 ENCOUNTER — Inpatient Hospital Stay (HOSPITAL_BASED_OUTPATIENT_CLINIC_OR_DEPARTMENT_OTHER): Payer: PPO | Admitting: Oncology

## 2018-12-02 ENCOUNTER — Inpatient Hospital Stay: Payer: PPO

## 2018-12-02 ENCOUNTER — Inpatient Hospital Stay: Payer: PPO | Attending: Oncology

## 2018-12-02 ENCOUNTER — Telehealth: Payer: Self-pay | Admitting: Oncology

## 2018-12-02 ENCOUNTER — Telehealth: Payer: Self-pay

## 2018-12-02 VITALS — BP 141/84 | HR 84 | Temp 98.5°F | Resp 18 | Ht 62.0 in | Wt 128.9 lb

## 2018-12-02 DIAGNOSIS — R5383 Other fatigue: Secondary | ICD-10-CM

## 2018-12-02 DIAGNOSIS — C787 Secondary malignant neoplasm of liver and intrahepatic bile duct: Secondary | ICD-10-CM | POA: Insufficient documentation

## 2018-12-02 DIAGNOSIS — G893 Neoplasm related pain (acute) (chronic): Secondary | ICD-10-CM | POA: Insufficient documentation

## 2018-12-02 DIAGNOSIS — C7800 Secondary malignant neoplasm of unspecified lung: Secondary | ICD-10-CM | POA: Insufficient documentation

## 2018-12-02 DIAGNOSIS — C61 Malignant neoplasm of prostate: Secondary | ICD-10-CM

## 2018-12-02 DIAGNOSIS — R300 Dysuria: Secondary | ICD-10-CM | POA: Diagnosis not present

## 2018-12-02 DIAGNOSIS — Z95828 Presence of other vascular implants and grafts: Secondary | ICD-10-CM

## 2018-12-02 DIAGNOSIS — D649 Anemia, unspecified: Secondary | ICD-10-CM

## 2018-12-02 LAB — CMP (CANCER CENTER ONLY)
ALBUMIN: 3.5 g/dL (ref 3.5–5.0)
ALK PHOS: 114 U/L (ref 38–126)
ALT: 23 U/L (ref 0–44)
ANION GAP: 9 (ref 5–15)
AST: 22 U/L (ref 15–41)
BILIRUBIN TOTAL: 0.4 mg/dL (ref 0.3–1.2)
BUN: 14 mg/dL (ref 8–23)
CALCIUM: 8.9 mg/dL (ref 8.9–10.3)
CO2: 24 mmol/L (ref 22–32)
Chloride: 108 mmol/L (ref 98–111)
Creatinine: 0.98 mg/dL (ref 0.61–1.24)
GFR, Estimated: >60 mL/min (ref >60)
GLUCOSE: 93 mg/dL (ref 70–99)
Potassium: 4.1 mmol/L (ref 3.5–5.1)
Sodium: 141 mmol/L (ref 135–145)
TOTAL PROTEIN: 6.6 g/dL (ref 6.5–8.1)

## 2018-12-02 LAB — CBC WITH DIFFERENTIAL (CANCER CENTER ONLY)
Abs Immature Granulocytes: 0.02 10*3/uL (ref 0.00–0.07)
Basophils Absolute: 0.1 10*3/uL (ref 0.0–0.1)
Basophils Relative: 1 %
EOS ABS: 0.1 10*3/uL (ref 0.0–0.5)
Eosinophils Relative: 1 %
HCT: 31.8 % — ABNORMAL LOW (ref 39.0–52.0)
Hemoglobin: 10.2 g/dL — ABNORMAL LOW (ref 13.0–17.0)
IMMATURE GRANULOCYTES: 0 %
Lymphocytes Relative: 15 %
Lymphs Abs: 1 10*3/uL (ref 0.7–4.0)
MCH: 26.6 pg (ref 26.0–34.0)
MCHC: 32.1 g/dL (ref 30.0–36.0)
MCV: 82.8 fL (ref 80.0–100.0)
MONOS PCT: 7 %
Monocytes Absolute: 0.5 10*3/uL (ref 0.1–1.0)
NEUTROS PCT: 76 %
Neutro Abs: 5 10*3/uL (ref 1.7–7.7)
PLATELETS: 238 10*3/uL (ref 150–400)
RBC: 3.84 MIL/uL — AB (ref 4.22–5.81)
RDW: 15.7 % — AB (ref 11.5–15.5)
WBC: 6.6 10*3/uL (ref 4.0–10.5)
nRBC: 0 % (ref 0.0–0.2)

## 2018-12-02 LAB — URINALYSIS, COMPLETE (UACMP) WITH MICROSCOPIC
Bilirubin Urine: NEGATIVE
GLUCOSE, UA: NEGATIVE mg/dL
Ketones, ur: NEGATIVE mg/dL
Nitrite: NEGATIVE
Protein, ur: 100 mg/dL — AB
RBC / HPF: >50 RBC/hpf — ABNORMAL HIGH (ref 0–5)
Specific Gravity, Urine: 1.011 (ref 1.005–1.030)
WBC, UA: >50 WBC/hpf — ABNORMAL HIGH (ref 0–5)
pH: 7 (ref 5.0–8.0)

## 2018-12-02 MED ORDER — SODIUM CHLORIDE 0.9% FLUSH
10.0000 mL | Freq: Once | INTRAVENOUS | Status: AC
  Administered 2018-12-02: 10 mL
  Filled 2018-12-02: qty 10

## 2018-12-02 MED ORDER — HEPARIN SOD (PORK) LOCK FLUSH 100 UNIT/ML IV SOLN
500.0000 [IU] | Freq: Once | INTRAVENOUS | Status: AC
  Administered 2018-12-02: 500 [IU]
  Filled 2018-12-02: qty 5

## 2018-12-02 MED ORDER — OXYCODONE-ACETAMINOPHEN 5-325 MG PO TABS: 1.0000 | ORAL_TABLET | Freq: Four times a day (QID) | ORAL | 0 refills | Status: DC | PRN

## 2018-12-02 MED FILL — OXYCODONE-ACETAMINOPHEN 5-3: 5-325 | 15 days supply | Qty: 60 | Fill #0

## 2018-12-02 NOTE — Telephone Encounter (Signed)
Printed calendar and avs. °

## 2018-12-02 NOTE — Telephone Encounter (Signed)
Contacted Amy RN charge nurse and made aware that patient is not receiving treatment today.

## 2018-12-02 NOTE — Progress Notes (Signed)
Hematology and Oncology Follow Up Visit  [name] Johnston [id number] 1951/05/23 67 y.o. 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  He was known to have disease to the liver after initial diagnosis and 2014 with Gleason score 8 and a PSA 40.    Prior Therapy:  He is started [place] on 06/27/2013. He subsequently received definitive radiation therapy for a planned dose of 75 gray completed in November 2014 under the care of Dr. Tammi Klippel.   He developed castration resistant disease with biopsy-proven liver metastasis in May 2016.  Docetaxel 75 mg/m given every 3 weeks with Neulasta support. He is status post 10 cycles of treatment concluded on 11/21/2015. He had an excellent PSA response with PSA dropping from 222 to 0.17.   PSA started to rise again with PSA up to 8.9 in August 2017.  He is status post orchiectomy completed in February 2019.  Zytiga 1000 mg with prednisone since September 2017.    Current therapy: Jevtana chemotherapy given at 20 mg/m every 3 weeks cycle 1 started on 07/29/2018.  He is here for cycle 7 of therapy.  Interim History:  [name] returns today for a repeat evaluation.  Since last visit, he was seen in the emergency department on 11/25/2018 for abdominal pain.  CT scan of the abdomen and pelvis did not show any acute pathology.  He was treated with oral pain medication and did not required hospitalization.  Since his emergency department visit, he has reported overall excessive fatigue, tiredness and weight loss.  According to his family these complaints have been increasing slowly over the last 4 weeks and have lost some weight.  He reports a lot of the side effects may be related to chemotherapy which she has tolerated poorly in the last few cycles.  He denies any vomiting at this time.  He does report periodic hematuria but no dysuria.  He does not  report any headaches, blurry vision or syncope.  He denies any confusion or dizziness.  He does not report any fevers or chills. He does not report any chest pain, palpitation, orthopnea or leg edema.  He does not report any cough, wheezing or hemoptysis.   He denies any changes in bowel habits.  He does not report any flank pain, frequency or urgency.  He does not report any joint deformity or pathological fractures.  He does not report any skin rashes or ecchymosis.  He does not report any anxiety or depression.  He denies any lymphadenopathy.  He denies any heat or cold intolerance.  Remainder of his review of systems is negative.  Medications: I have reviewed the patient's current medications.   Current Outpatient Medications  Medication Sig Dispense Refill  . acetaminophen (TYLENOL) 500 MG tablet Take 500-1,000 mg by mouth every 6 (six) hours as needed.     . ciprofloxacin (CIPRO) 500 MG tablet Take 1 tablet (500 mg total) by mouth 2 (two) times daily. 14 tablet 0  . docusate sodium (COLACE) 100 MG capsule Take 1 capsule (100 mg total) by mouth 2 (two) times daily as needed for mild constipation or moderate constipation. 60 capsule 0  . dronabinol (MARINOL) 2.5 MG capsule Take 1 capsule (2.5 mg total) by mouth 2 (two) times daily before a meal. (Patient not taking: Reported on 11/16/2018) 60 capsule 0  . megestrol (MEGACE) 400 MG/10ML suspension Take 10 mLs (400 mg total) by mouth 2 (  two) times daily. (Patient not taking: Reported on 11/16/2018) 240 mL 0  . oxyCODONE-acetaminophen (PERCOCET/ROXICET) 5-325 MG tablet Take 1 tablet by mouth every 6 (six) hours as needed for severe pain. 25 tablet 0  . phenazopyridine (PYRIDIUM) 100 MG tablet Take 200 mg by mouth 3 (three) times daily.  1  . prochlorperazine (COMPAZINE) 10 MG tablet Take 1 tablet (10 mg total) by mouth every 6 (six) hours as needed for nausea or vomiting. (Patient not taking: Reported on 11/16/2018) 30 tablet 0  . tamsulosin (FLOMAX)  0.4 MG CAPS capsule Take 0.4 mg by mouth daily.  11  . traMADol (ULTRAM) 50 MG tablet Take 1 tablet (50 mg total) by mouth every 6 (six) hours as needed. 20 tablet 0   No current facility-administered medications for this visit.    Facility-Administered Medications Ordered in Other Visits  Medication Dose Route Frequency Provider Last Rate Last Dose  . heparin lock flush 100 unit/mL  500 Units Intracatheter Once Wyatt Portela, MD      . sodium chloride flush (NS) 0.9 % injection 10 mL  10 mL Intracatheter Once Wyatt Portela, MD      . sodium chloride flush (NS) 0.9 % injection 10 mL  10 mL Intracatheter PRN Wyatt Portela, MD   10 mL at 09/30/18 1236     Allergies:  Allergies  Allergen Reactions  . Aspirin Hives    Past Medical History, Surgical history, Social history, and Family History were reviewed and updated.    Physical Exam:   Blood pressure (!) 141/84, pulse 84, temperature 98.5 F (36.9 C), temperature source Oral, resp. rate 18, height 5\' 2"  (1.575 m), weight 128 lb 14.4 oz (58.5 kg), SpO2 100 %.     ECOG: 1    General appearance: Alert, awake without any distress. Head: Atraumatic without abnormalities Oropharynx: Without any thrush or ulcers. Eyes: No scleral icterus. Lymph nodes: No lymphadenopathy noted in the cervical, supraclavicular, or axillary nodes Heart:regular rate and rhythm, without any murmurs or gallops.   Lung: Clear to auscultation without any rhonchi, wheezes or dullness to percussion. Abdomin: Soft, nontender without any shifting dullness or ascites. Musculoskeletal: No clubbing or cyanosis. Neurological: No motor or sensory deficits. Skin: No rashes or lesions.         Lab Results: Lab Results  Component Value Date   WBC 8.0 11/25/2018   HGB 10.5 (L) 11/25/2018   HCT 35.9 (L) 11/25/2018   MCV 85.5 11/25/2018   PLT 277 11/25/2018     Chemistry      Component Value Date/Time   NA 141 11/25/2018 1136   NA 142  12/17/2017 1303   K 4.0 11/25/2018 1136   K 3.2 (L) 12/17/2017 1303   CL 107 11/25/2018 1136   CO2 22 11/25/2018 1136   CO2 28 12/17/2017 1303   BUN 10 11/25/2018 1136   BUN 14.0 12/17/2017 1303   CREATININE 1.20 11/25/2018 1136   CREATININE 0.92 11/11/2018 0823   CREATININE 1.1 12/17/2017 1303      Component Value Date/Time   CALCIUM 9.1 11/25/2018 1136   CALCIUM 9.0 12/17/2017 1303   ALKPHOS 123 11/25/2018 1136   ALKPHOS 85 12/17/2017 1303   AST 37 11/25/2018 1136   AST 20 11/11/2018 0823   AST 12 12/17/2017 1303   ALT 47 (H) 11/25/2018 1136   ALT 23 11/11/2018 0823   ALT 15 12/17/2017 1303   BILITOT 0.5 11/25/2018 1136   BILITOT 0.3 11/11/2018 5329  BILITOT 0.41 12/17/2017 1303      Results for KINSER, FELLMAN (MRN [id number]) as of 12/02/2018 11:12  Ref. Range 09/30/2018 09:14 10/21/2018 08:33 11/11/2018 08:23  Prostate Specific Ag, Serum Latest Ref Range: 0.0 - 4.0 ng/mL 2.2 2.0 1.7    CLINICAL DATA:  Right lower quadrant abdominal pain, gross hematuria. History of metastatic prostate cancer.  EXAM: CT ABDOMEN AND PELVIS WITH CONTRAST  TECHNIQUE: Multidetector CT imaging of the abdomen and pelvis was performed using the standard protocol following bolus administration of intravenous contrast.  CONTRAST:  172mL OMNIPAQUE IOHEXOL 300 MG/ML  SOLN  COMPARISON:  CT scan of July 18, 2018.  FINDINGS: Lower chest: 1 cm right middle lobe nodule is noted concerning for metastatic disease.  Hepatobiliary: No gallstones or biliary dilatation is noted. Stable hypodense abnormalities are noted in the medial segment of left hepatic lobe and posterior segment of right hepatic lobe of indeterminate etiology. 11 mm low density is noted laterally and inferiorly in right hepatic lobe which is significantly smaller compared to prior exam suggesting improving metastatic lesion.  Pancreas: Unremarkable. No pancreatic ductal dilatation or surrounding inflammatory  changes.  Spleen: Normal in size without focal abnormality.  Adrenals/Urinary Tract: Adrenal glands appear normal. Left ureteral stent is noted in grossly good position. No significant hydronephrosis is noted on the left. There is noted proximal right ureteral dilatation with wall enhancement suggesting ureteral inflammation. No obstructing calculus is noted. There is crescent-shaped wall thickening involving the right and posterior portions of urinary bladder concerning for possible neoplasm.  Stomach/Bowel: The stomach appears normal. There is no evidence of bowel obstruction or inflammation. The appendix is not clearly visualized.  Vascular/Lymphatic: Aortic atherosclerosis. No enlarged abdominal or pelvic lymph nodes.  Reproductive: Status post prostatectomy.  Other: No abdominal wall hernia or abnormality. No abdominopelvic ascites.  Musculoskeletal: No acute or significant osseous findings.  IMPRESSION: Interval development of crescent shaped wall thickening involving right and posterior portions of urinary bladder which may represent inflammation or possibly malignancy. Cystoscopy is recommended for further evaluation.  Left-sided ureteral stent is in grossly good position. There remains mild right ureteral dilatation with wall enhancement suggesting ureteral inflammation. No obstructing calculus is noted.  Right hepatic metastatic lesion noted on prior exam is significantly smaller currently.  1 cm right middle lobe nodule is noted concerning for metastatic lesion.  Aortic Atherosclerosis (ICD10-I70.0).    Impression and Plan:  67 year old man with:  1.  Castration-resistant prostate cancer with disease to the liver documented in 2016.  He is currently on Jevtana chemotherapy with excellent response to his disease.  CT scan of the abdomen and pelvis confirmed a objective response with decrease in his hepatic metastasis.  His PSA is also  declining.  Risks and benefits of continuing chemotherapy was reviewed today and long-term complications were discussed.  Given his current condition and complaints, we have opted to discontinue chemotherapy at this time given the side effects.  His disease status is under control and it is reasonable to proceed with active surveillance at this time.  He understands this his disease will progress at some point and we will assess treatment options at that time.  He also understands the treatment options may be limited and might not have any effective treatment beyond chemotherapy.  He understands this and wanting to stop chemotherapy at this time.  We will continue to follow him closely and assess his disease status periodically.    2.  Hepatic metastasis: Regressed based on CT scan.  3.  IV access: Will be flushed off chemotherapy every 4 to 6 weeks.  4.  Left ureteral obstruction: Stent was placed on October 11, 2018.  This will be replaced again in January 2020.  5. Prognosis: This was discussed today in detail with the patient and his family.  He understands he has an incurable malignancy although it is under reasonable control now and will likely progress in the future.  His life expectancy likely in months rather than years mostly double-digit months at this time.  6.  Anemia: Hemoglobin is adequate today and does not require any transfusion.  7.  Dysuria and hematuria: Related to stent placement and follows with urology regularly at this time.  8.  Abdominal and pelvic pain: This is related to malignancy and his stent.  I have refilled his Percocet this time with instructions to use it.  9. Follow-up: Will be in 4 weeks to follow his progress.  30  minutes was spent with the patient face-to-face today.  More than 50% of time was dedicated to discussing his disease status, treatment options imaging studies and documenting his prognosis with the family via an interpreter.    Zola Button, MD 12/13/201911:08 AM

## 2018-12-03 ENCOUNTER — Inpatient Hospital Stay: Payer: PPO

## 2018-12-03 LAB — PROSTATE-SPECIFIC AG, SERUM (LABCORP): Prostate Specific Ag, Serum: 1.6 ng/mL (ref 0.0–4.0)

## 2018-12-03 LAB — URINE CULTURE: Culture: NO GROWTH

## 2018-12-12 ENCOUNTER — Other Ambulatory Visit: Payer: Self-pay | Admitting: Oncology

## 2018-12-15 MED FILL — OXYCODONE-ACETAMINOPHEN 5-3: 5-325 | 15 days supply | Qty: 60 | Fill #0

## 2018-12-23 ENCOUNTER — Ambulatory Visit: Payer: PPO | Admitting: Oncology

## 2018-12-23 ENCOUNTER — Other Ambulatory Visit: Payer: PPO

## 2018-12-23 ENCOUNTER — Ambulatory Visit: Payer: PPO

## 2018-12-24 ENCOUNTER — Ambulatory Visit: Payer: PPO

## 2018-12-26 ENCOUNTER — Ambulatory Visit: Payer: PPO

## 2019-01-11 DIAGNOSIS — C61 Malignant neoplasm of prostate: Secondary | ICD-10-CM | POA: Diagnosis not present

## 2019-01-11 DIAGNOSIS — C7919 Secondary malignant neoplasm of other urinary organs: Secondary | ICD-10-CM | POA: Diagnosis not present

## 2019-01-11 DIAGNOSIS — N130 Hydronephrosis with ureteropelvic junction obstruction: Secondary | ICD-10-CM | POA: Diagnosis not present

## 2019-01-11 DIAGNOSIS — R310 Gross hematuria: Secondary | ICD-10-CM | POA: Diagnosis not present

## 2019-01-12 ENCOUNTER — Inpatient Hospital Stay (HOSPITAL_BASED_OUTPATIENT_CLINIC_OR_DEPARTMENT_OTHER): Payer: PPO | Admitting: Oncology

## 2019-01-12 ENCOUNTER — Inpatient Hospital Stay: Payer: PPO | Attending: Oncology

## 2019-01-12 ENCOUNTER — Inpatient Hospital Stay: Payer: PPO

## 2019-01-12 VITALS — BP 137/86 | HR 80 | Temp 98.2°F | Resp 18 | Ht 62.0 in | Wt 128.3 lb

## 2019-01-12 DIAGNOSIS — C61 Malignant neoplasm of prostate: Secondary | ICD-10-CM | POA: Diagnosis not present

## 2019-01-12 DIAGNOSIS — D649 Anemia, unspecified: Secondary | ICD-10-CM

## 2019-01-12 DIAGNOSIS — Z95828 Presence of other vascular implants and grafts: Secondary | ICD-10-CM

## 2019-01-12 DIAGNOSIS — C787 Secondary malignant neoplasm of liver and intrahepatic bile duct: Secondary | ICD-10-CM | POA: Diagnosis not present

## 2019-01-12 DIAGNOSIS — C7800 Secondary malignant neoplasm of unspecified lung: Secondary | ICD-10-CM | POA: Diagnosis not present

## 2019-01-12 LAB — CBC WITH DIFFERENTIAL (CANCER CENTER ONLY)
Abs Immature Granulocytes: 0.01 10*3/uL (ref 0.00–0.07)
Basophils Absolute: 0 10*3/uL (ref 0.0–0.1)
Basophils Relative: 1 %
Eosinophils Absolute: 0.3 10*3/uL (ref 0.0–0.5)
Eosinophils Relative: 5 %
HEMATOCRIT: 33.7 % — AB (ref 39.0–52.0)
Hemoglobin: 10.9 g/dL — ABNORMAL LOW (ref 13.0–17.0)
Immature Granulocytes: 0 %
LYMPHS ABS: 0.9 10*3/uL (ref 0.7–4.0)
Lymphocytes Relative: 18 %
MCH: 26.2 pg (ref 26.0–34.0)
MCHC: 32.3 g/dL (ref 30.0–36.0)
MCV: 81 fL (ref 80.0–100.0)
Monocytes Absolute: 0.3 10*3/uL (ref 0.1–1.0)
Monocytes Relative: 7 %
NRBC: 0 % (ref 0.0–0.2)
Neutro Abs: 3.5 10*3/uL (ref 1.7–7.7)
Neutrophils Relative %: 69 %
Platelet Count: 174 10*3/uL (ref 150–400)
RBC: 4.16 MIL/uL — ABNORMAL LOW (ref 4.22–5.81)
RDW: 13.9 % (ref 11.5–15.5)
WBC Count: 5.1 10*3/uL (ref 4.0–10.5)

## 2019-01-12 LAB — URINALYSIS, COMPLETE (UACMP) WITH MICROSCOPIC
Bacteria, UA: NONE SEEN
Bilirubin Urine: NEGATIVE
Glucose, UA: NEGATIVE mg/dL
Ketones, ur: NEGATIVE mg/dL
Nitrite: NEGATIVE
Protein, ur: 30 mg/dL — AB
RBC / HPF: >50 RBC/hpf — ABNORMAL HIGH (ref 0–5)
Specific Gravity, Urine: 1.015 (ref 1.005–1.030)
pH: 8 (ref 5.0–8.0)

## 2019-01-12 LAB — CMP (CANCER CENTER ONLY)
ALT: 32 U/L (ref 0–44)
ANION GAP: 8 (ref 5–15)
AST: 25 U/L (ref 15–41)
Albumin: 3.7 g/dL (ref 3.5–5.0)
Alkaline Phosphatase: 141 U/L — ABNORMAL HIGH (ref 38–126)
BUN: 13 mg/dL (ref 8–23)
CO2: 27 mmol/L (ref 22–32)
Calcium: 8.8 mg/dL — ABNORMAL LOW (ref 8.9–10.3)
Chloride: 106 mmol/L (ref 98–111)
Creatinine: 0.83 mg/dL (ref 0.61–1.24)
GFR, Est AFR Am: >60 mL/min (ref >60)
GFR, Estimated: >60 mL/min (ref >60)
Glucose, Bld: 107 mg/dL — ABNORMAL HIGH (ref 70–99)
Potassium: 3.5 mmol/L (ref 3.5–5.1)
SODIUM: 141 mmol/L (ref 135–145)
Total Bilirubin: 0.4 mg/dL (ref 0.3–1.2)
Total Protein: 6.9 g/dL (ref 6.5–8.1)

## 2019-01-12 MED ORDER — HEPARIN SOD (PORK) LOCK FLUSH 100 UNIT/ML IV SOLN
500.0000 [IU] | Freq: Once | INTRAVENOUS | Status: AC
  Administered 2019-01-12: 500 [IU]
  Filled 2019-01-12: qty 5

## 2019-01-12 MED ORDER — SODIUM CHLORIDE 0.9% FLUSH
10.0000 mL | Freq: Once | INTRAVENOUS | Status: AC
  Administered 2019-01-12: 10 mL
  Filled 2019-01-12: qty 10

## 2019-01-12 NOTE — Progress Notes (Signed)
Hematology and Oncology Follow Up Visit  [name] Johnston [id number] [birth year] 68 y.o. 01/12/2019 2:47 PM [name], MDDewey, [name], MD   Principle Diagnosis: 68 year old man with advanced prostate cancer with disease to the liver diagnosed in 2014.  He has castration-resistant after initial diagnosis with Gleason score 8 and a PSA 40.    Prior Therapy:  He is started [place] on 06/27/2013. He subsequently received definitive radiation therapy for a planned dose of 75 gray completed in November 2014 under the care of Dr. Tammi Klippel.   He developed castration resistant disease with biopsy-proven liver metastasis in May 2016.  Docetaxel 75 mg/m given every 3 weeks with Neulasta support. He is status post 10 cycles of treatment concluded on 11/21/2015. He had an excellent PSA response with PSA dropping from 222 to 0.17.   PSA started to rise again with PSA up to 8.9 in August 2017.  He is status post orchiectomy completed in February 2019.  Zytiga 1000 mg with prednisone since September 2017.   Jevtana chemotherapy given at 20 mg/m every 3 weeks cycle 1 started on 07/29/2018.  He completed 6 cycles of therapy in November 2019.  Therapy discontinued because of patient's preference.  Current therapy:   Interim History:  [name] Johnston is here for a repeat evaluation.  Since last visit, chemotherapy has been discontinued because of his requests.  Since the last visit, he reports no major changes in his health.  His health has improved slightly since discontinuation of chemotherapy.  He denies any nausea, vomiting or decrease in his appetite and his weight is stable.  He does report flank pain which is related to his stent which is due to be replaced soon.  He does use oxycodone with acetaminophen which helped his complaints.  His activity level has decreased however.  Performance status and quality of life remained about the same which has been climbing.  He does not report any headaches, blurry  vision or syncope.  He denies any lethargy or dizziness.  He does not report any fevers or chills. He does not report any chest pain, palpitation, orthopnea or leg edema.  He does not report any cough, wheezing or hemoptysis.   He denies any constipation or diarrhea.  He does not report any frequency urgency or hematuria.  He does not report any arthralgias or myalgias.  He does not report any petechia or bruising.  He does not report any mood changes.  He denies any anxiety or depression.  Remainder of his review of systems is negative.  Medications: I have reviewed the patient's current medications.   Current Outpatient Medications  Medication Sig Dispense Refill  . acetaminophen (TYLENOL) 500 MG tablet Take 500-1,000 mg by mouth every 6 (six) hours as needed.     . ciprofloxacin (CIPRO) 500 MG tablet Take 1 tablet (500 mg total) by mouth 2 (two) times daily. 14 tablet 0  . docusate sodium (COLACE) 100 MG capsule Take 1 capsule (100 mg total) by mouth 2 (two) times daily as needed for mild constipation or moderate constipation. 60 capsule 0  . dronabinol (MARINOL) 2.5 MG capsule Take 1 capsule (2.5 mg total) by mouth 2 (two) times daily before a meal. (Patient not taking: Reported on 11/16/2018) 60 capsule 0  . megestrol (MEGACE) 400 MG/10ML suspension Take 10 mLs (400 mg total) by mouth 2 (two) times daily. (Patient not taking: Reported on 11/16/2018) 240 mL 0  . oxyCODONE-acetaminophen (PERCOCET/ROXICET) 5-325 MG tablet TAKE 1 TABLET BY MOUTH  EVERY 6 HOURS AS NEEDED FOR SEVERE PAIN 60 tablet 0  . phenazopyridine (PYRIDIUM) 100 MG tablet Take 200 mg by mouth 3 (three) times daily.  1  . prochlorperazine (COMPAZINE) 10 MG tablet Take 1 tablet (10 mg total) by mouth every 6 (six) hours as needed for nausea or vomiting. (Patient not taking: Reported on 11/16/2018) 30 tablet 0  . tamsulosin (FLOMAX) 0.4 MG CAPS capsule Take 0.4 mg by mouth daily.  11  . traMADol (ULTRAM) 50 MG tablet Take 1 tablet (50  mg total) by mouth every 6 (six) hours as needed. 20 tablet 0   No current facility-administered medications for this visit.    Facility-Administered Medications Ordered in Other Visits  Medication Dose Route Frequency Provider Last Rate Last Dose  . heparin lock flush 100 unit/mL  500 Units Intracatheter Once Wyatt Portela, MD      . sodium chloride flush (NS) 0.9 % injection 10 mL  10 mL Intracatheter Once Wyatt Portela, MD      . sodium chloride flush (NS) 0.9 % injection 10 mL  10 mL Intracatheter PRN Wyatt Portela, MD   10 mL at 09/30/18 1236     Allergies:  Allergies  Allergen Reactions  . Aspirin Hives    Past Medical History, Surgical history, Social history, and Family History were reviewed and updated.    Physical Exam:  Blood pressure 137/86, pulse 80, temperature 98.2 F (36.8 C), temperature source Oral, resp. rate 18, height 5\' 2"  (1.575 m), weight 128 lb 4.8 oz (58.2 kg), SpO2 100 %.      ECOG: 1   General appearance: Comfortable appearing without any discomfort Head: Normocephalic without any trauma Oropharynx: Mucous membranes are moist and pink without any thrush or ulcers. Eyes: Pupils are equal and round reactive to light. Lymph nodes: No cervical, supraclavicular, inguinal or axillary lymphadenopathy.   Heart:regular rate and rhythm.  S1 and S2 without leg edema. Lung: Clear without any rhonchi or wheezes.  No dullness to percussion. Abdomin: Soft, nontender, nondistended with good bowel sounds.  No hepatosplenomegaly. Musculoskeletal: No joint deformity or effusion.  Full range of motion noted. Neurological: No deficits noted on motor, sensory and deep tendon reflex exam. Skin: No petechial rash or dryness.  Appeared moist.  .          Lab Results: Lab Results  Component Value Date   WBC 6.6 12/02/2018   HGB 10.2 (L) 12/02/2018   HCT 31.8 (L) 12/02/2018   MCV 82.8 12/02/2018   PLT 238 12/02/2018     Chemistry      Component  Value Date/Time   NA 141 12/02/2018 1100   NA 142 12/17/2017 1303   K 4.1 12/02/2018 1100   K 3.2 (L) 12/17/2017 1303   CL 108 12/02/2018 1100   CO2 24 12/02/2018 1100   CO2 28 12/17/2017 1303   BUN 14 12/02/2018 1100   BUN 14.0 12/17/2017 1303   CREATININE 0.98 12/02/2018 1100   CREATININE 1.1 12/17/2017 1303      Component Value Date/Time   CALCIUM 8.9 12/02/2018 1100   CALCIUM 9.0 12/17/2017 1303   ALKPHOS 114 12/02/2018 1100   ALKPHOS 85 12/17/2017 1303   AST 22 12/02/2018 1100   AST 12 12/17/2017 1303   ALT 23 12/02/2018 1100   ALT 15 12/17/2017 1303   BILITOT 0.4 12/02/2018 1100   BILITOT 0.41 12/17/2017 1303     Results for RUMI, TARAS (MRN [id number]) as of 01/12/2019 14:39  Ref. Range 11/11/2018 08:23 12/02/2018 11:00  Prostate Specific Ag, Serum Latest Ref Range: 0.0 - 4.0 ng/mL 1.7 1.6    Impression and Plan:  68 year old man with:  1.  Advanced prostate cancer that is currently castration-resistant with a noted disease to the liver in 2016.  He is status post Jevtana chemotherapy which has been discontinued based on his wishes.  His PSA continues to be low and an excellent response in the last 6 months.  The natural course of this disease and treatment options were discussed today with the patient and his family via an interpreter.  I have recommended continued active surveillance at this time given his disease status is under control and that his performance status is marginal.  I will consider resuming chemotherapy in the future if he develops symptomatic progression.  He has limited options to treat his cancer moving forward however.   2.  Hepatic metastasis: CT scan in December 2019 was personally reviewed today which showed improvement in his tumor.  3.  IV access: Port-A-Cath remains in place and will be flushed periodically.  4.  Left ureteral obstruction: Stent will be removed in the next a week or so.  He is complaining of pain around that area and I  believe that will improve with stent removal.  5. Prognosis: He has incurable malignancy although disease that has been palliated effectively at this time.  His performance status remains marginal I do not recommend any further treatment at this time.  He understands at some point this disease will take over his body.  6.  Anemia: Hemoglobin continues to improve off chemotherapy.  7. Follow-up: Will be in 7 weeks to follow his progress.  25  minutes was spent with the patient face-to-face today.  More than 50% of time was dedicated to reviewing imaging studies, laboratory data, discussing treatment options and answering questions regarding prognosis    Zola Button, MD 1/23/20202:47 PM

## 2019-01-13 LAB — PROSTATE-SPECIFIC AG, SERUM (LABCORP): Prostate Specific Ag, Serum: 5.9 ng/mL — ABNORMAL HIGH (ref 0.0–4.0)

## 2019-01-17 DIAGNOSIS — N130 Hydronephrosis with ureteropelvic junction obstruction: Secondary | ICD-10-CM | POA: Diagnosis not present

## 2019-01-23 DIAGNOSIS — R310 Gross hematuria: Secondary | ICD-10-CM | POA: Diagnosis not present

## 2019-01-23 DIAGNOSIS — N131 Hydronephrosis with ureteral stricture, not elsewhere classified: Secondary | ICD-10-CM | POA: Diagnosis not present

## 2019-01-24 ENCOUNTER — Other Ambulatory Visit (HOSPITAL_COMMUNITY): Payer: Self-pay | Admitting: Urology

## 2019-01-24 ENCOUNTER — Other Ambulatory Visit: Payer: Self-pay | Admitting: Urology

## 2019-01-24 DIAGNOSIS — N130 Hydronephrosis with ureteropelvic junction obstruction: Secondary | ICD-10-CM

## 2019-01-30 ENCOUNTER — Ambulatory Visit (HOSPITAL_COMMUNITY)
Admission: RE | Admit: 2019-01-30 | Discharge: 2019-01-30 | Disposition: A | Discharge status: Home / Self Care | Payer: PPO | Source: Ambulatory Visit | Attending: Urology | Admitting: Urology

## 2019-01-30 DIAGNOSIS — N130 Hydronephrosis with ureteropelvic junction obstruction: Secondary | ICD-10-CM | POA: Diagnosis not present

## 2019-01-30 MED ORDER — TECHNETIUM TC 99M MERTIATIDE
5.3000 | Freq: Once | INTRAVENOUS | Status: AC | PRN
  Administered 2019-01-30: 5.3 via INTRAVENOUS

## 2019-01-30 MED ORDER — FUROSEMIDE 10 MG/ML IJ SOLN
29.0000 mg | Freq: Once | INTRAMUSCULAR | Status: AC
  Administered 2019-01-30: 29 mg via INTRAVENOUS

## 2019-01-30 MED ORDER — FUROSEMIDE 10 MG/ML IJ SOLN
INTRAMUSCULAR | Status: AC
  Filled 2019-01-30: qty 4

## 2019-02-01 ENCOUNTER — Other Ambulatory Visit: Payer: Self-pay | Admitting: Urology

## 2019-02-01 DIAGNOSIS — N131 Hydronephrosis with ureteral stricture, not elsewhere classified: Secondary | ICD-10-CM | POA: Diagnosis not present

## 2019-02-01 DIAGNOSIS — C7919 Secondary malignant neoplasm of other urinary organs: Secondary | ICD-10-CM | POA: Diagnosis not present

## 2019-02-03 ENCOUNTER — Other Ambulatory Visit: Payer: Self-pay

## 2019-02-03 ENCOUNTER — Encounter (HOSPITAL_BASED_OUTPATIENT_CLINIC_OR_DEPARTMENT_OTHER): Payer: Self-pay | Admitting: *Deleted

## 2019-02-03 NOTE — Progress Notes (Signed)
Spoke with hjim ayun daughter of patient and patient on 3 way call Npo after midnight arrive 930 am [facility] meds to take sip of water: tylenol prn  driver daughter Dorthula Matas cell [phone] Confirmation email for montagnard rhade interpreter received and placed on patient chart Has surgery orders in epic Needs I stat 4 and ekg

## 2019-02-06 ENCOUNTER — Encounter (HOSPITAL_BASED_OUTPATIENT_CLINIC_OR_DEPARTMENT_OTHER): Payer: Self-pay | Admitting: Anesthesiology

## 2019-02-06 ENCOUNTER — Encounter (HOSPITAL_BASED_OUTPATIENT_CLINIC_OR_DEPARTMENT_OTHER)
Admission: RE | Disposition: A | Discharge status: Home / Self Care | Payer: Self-pay | Source: Home / Self Care | Attending: Urology

## 2019-02-06 ENCOUNTER — Other Ambulatory Visit: Payer: Self-pay

## 2019-02-06 ENCOUNTER — Ambulatory Visit (HOSPITAL_BASED_OUTPATIENT_CLINIC_OR_DEPARTMENT_OTHER): Payer: PPO | Admitting: Anesthesiology

## 2019-02-06 ENCOUNTER — Ambulatory Visit (HOSPITAL_BASED_OUTPATIENT_CLINIC_OR_DEPARTMENT_OTHER)
Admission: RE | Admit: 2019-02-06 | Discharge: 2019-02-06 | Disposition: A | Discharge status: Home / Self Care | Payer: PPO | Attending: Urology | Admitting: Urology

## 2019-02-06 DIAGNOSIS — Z7982 Long term (current) use of aspirin: Secondary | ICD-10-CM | POA: Diagnosis not present

## 2019-02-06 DIAGNOSIS — K219 Gastro-esophageal reflux disease without esophagitis: Secondary | ICD-10-CM | POA: Insufficient documentation

## 2019-02-06 DIAGNOSIS — C61 Malignant neoplasm of prostate: Secondary | ICD-10-CM | POA: Insufficient documentation

## 2019-02-06 DIAGNOSIS — N135 Crossing vessel and stricture of ureter without hydronephrosis: Secondary | ICD-10-CM | POA: Diagnosis not present

## 2019-02-06 DIAGNOSIS — I129 Hypertensive chronic kidney disease with stage 1 through stage 4 chronic kidney disease, or unspecified chronic kidney disease: Secondary | ICD-10-CM | POA: Insufficient documentation

## 2019-02-06 DIAGNOSIS — Z886 Allergy status to analgesic agent status: Secondary | ICD-10-CM | POA: Insufficient documentation

## 2019-02-06 DIAGNOSIS — Z9079 Acquired absence of other genital organ(s): Secondary | ICD-10-CM | POA: Diagnosis not present

## 2019-02-06 DIAGNOSIS — N183 Chronic kidney disease, stage 3 (moderate): Secondary | ICD-10-CM | POA: Diagnosis not present

## 2019-02-06 DIAGNOSIS — D631 Anemia in chronic kidney disease: Secondary | ICD-10-CM | POA: Diagnosis not present

## 2019-02-06 DIAGNOSIS — J45909 Unspecified asthma, uncomplicated: Secondary | ICD-10-CM | POA: Diagnosis not present

## 2019-02-06 DIAGNOSIS — Z923 Personal history of irradiation: Secondary | ICD-10-CM | POA: Insufficient documentation

## 2019-02-06 DIAGNOSIS — N131 Hydronephrosis with ureteral stricture, not elsewhere classified: Secondary | ICD-10-CM | POA: Diagnosis not present

## 2019-02-06 HISTORY — PX: CYSTOSCOPY W/ URETERAL STENT PLACEMENT: SHX1429

## 2019-02-06 HISTORY — DX: Fever, unspecified: R50.9

## 2019-02-06 LAB — POCT I-STAT 4, (NA,K, GLUC, HGB,HCT)
Glucose, Bld: 101 mg/dL — ABNORMAL HIGH (ref 70–99)
HCT: 35 % — ABNORMAL LOW (ref 39.0–52.0)
HEMOGLOBIN: 11.9 g/dL — AB (ref 13.0–17.0)
Potassium: 3.8 mmol/L (ref 3.5–5.1)
Sodium: 143 mmol/L (ref 135–145)

## 2019-02-06 SURGERY — CYSTOSCOPY, FLEXIBLE, WITH STENT REPLACEMENT
Anesthesia: General | Site: Ureter | Laterality: Left

## 2019-02-06 MED ORDER — OXYCODONE HCL 5 MG PO TABS
ORAL_TABLET | ORAL | Status: AC
  Filled 2019-02-06: qty 1

## 2019-02-06 MED ORDER — DEXAMETHASONE SODIUM PHOSPHATE 4 MG/ML IJ SOLN
INTRAMUSCULAR | Status: DC | PRN
  Administered 2019-02-06: 5 mg via INTRAVENOUS

## 2019-02-06 MED ORDER — ONDANSETRON HCL 4 MG/2ML IJ SOLN
INTRAMUSCULAR | Status: DC | PRN
  Administered 2019-02-06: 4 mg via INTRAVENOUS

## 2019-02-06 MED ORDER — OXYCODONE-ACETAMINOPHEN 5-325 MG PO TABS: 1.0000 | ORAL_TABLET | Freq: Four times a day (QID) | ORAL | 0 refills | Status: DC | PRN

## 2019-02-06 MED ORDER — ACETAMINOPHEN 325 MG PO TABS
650.0000 mg | ORAL_TABLET | ORAL | Status: DC | PRN
  Filled 2019-02-06: qty 2

## 2019-02-06 MED ORDER — CEFAZOLIN SODIUM-DEXTROSE 2-4 GM/100ML-% IV SOLN
INTRAVENOUS | Status: AC
  Filled 2019-02-06: qty 100

## 2019-02-06 MED ORDER — CEFAZOLIN SODIUM-DEXTROSE 2-4 GM/100ML-% IV SOLN
2.0000 g | INTRAVENOUS | Status: AC
  Administered 2019-02-06: 2 g via INTRAVENOUS
  Filled 2019-02-06: qty 100

## 2019-02-06 MED ORDER — FENTANYL CITRATE (PF) 100 MCG/2ML IJ SOLN
25.0000 ug | INTRAMUSCULAR | Status: DC | PRN
  Filled 2019-02-06: qty 1

## 2019-02-06 MED ORDER — OXYCODONE HCL 5 MG PO TABS
5.0000 mg | ORAL_TABLET | Freq: Once | ORAL | Status: AC | PRN
  Administered 2019-02-06: 5 mg via ORAL
  Filled 2019-02-06: qty 1

## 2019-02-06 MED ORDER — IOHEXOL 300 MG/ML  SOLN
INTRAMUSCULAR | Status: DC | PRN
  Administered 2019-02-06: 8 mL

## 2019-02-06 MED ORDER — SODIUM CHLORIDE 0.9 % IV SOLN
INTRAVENOUS | Status: DC
  Administered 2019-02-06: 10:00:00 via INTRAVENOUS
  Filled 2019-02-06: qty 1000

## 2019-02-06 MED ORDER — LIDOCAINE 2% (20 MG/ML) 5 ML SYRINGE
INTRAMUSCULAR | Status: AC
  Filled 2019-02-06: qty 5

## 2019-02-06 MED ORDER — PROPOFOL 10 MG/ML IV BOLUS
INTRAVENOUS | Status: AC
  Filled 2019-02-06: qty 20

## 2019-02-06 MED ORDER — DEXAMETHASONE SODIUM PHOSPHATE 10 MG/ML IJ SOLN
INTRAMUSCULAR | Status: AC
  Filled 2019-02-06: qty 1

## 2019-02-06 MED ORDER — LIDOCAINE 2% (20 MG/ML) 5 ML SYRINGE
INTRAMUSCULAR | Status: DC | PRN
  Administered 2019-02-06: 40 mg via INTRAVENOUS

## 2019-02-06 MED ORDER — KETOROLAC TROMETHAMINE 15 MG/ML IJ SOLN
INTRAMUSCULAR | Status: DC | PRN
  Administered 2019-02-06: 15 mg via INTRAVENOUS

## 2019-02-06 MED ORDER — MIDAZOLAM HCL 2 MG/2ML IJ SOLN
INTRAMUSCULAR | Status: AC
  Filled 2019-02-06: qty 2

## 2019-02-06 MED ORDER — FENTANYL CITRATE (PF) 100 MCG/2ML IJ SOLN
INTRAMUSCULAR | Status: DC | PRN
  Administered 2019-02-06: 50 ug via INTRAVENOUS

## 2019-02-06 MED ORDER — MIDAZOLAM HCL 5 MG/5ML IJ SOLN
INTRAMUSCULAR | Status: DC | PRN
  Administered 2019-02-06: 2 mg via INTRAVENOUS

## 2019-02-06 MED ORDER — SODIUM CHLORIDE 0.9% FLUSH
3.0000 mL | Freq: Two times a day (BID) | INTRAVENOUS | Status: DC
  Filled 2019-02-06: qty 3

## 2019-02-06 MED ORDER — ACETAMINOPHEN 650 MG RE SUPP
650.0000 mg | RECTAL | Status: DC | PRN
  Filled 2019-02-06: qty 1

## 2019-02-06 MED ORDER — PROPOFOL 10 MG/ML IV BOLUS
INTRAVENOUS | Status: DC | PRN
  Administered 2019-02-06: 120 mg via INTRAVENOUS

## 2019-02-06 MED ORDER — ONDANSETRON HCL 4 MG/2ML IJ SOLN
4.0000 mg | Freq: Once | INTRAMUSCULAR | Status: DC | PRN
  Filled 2019-02-06: qty 2

## 2019-02-06 MED ORDER — SODIUM CHLORIDE 0.9 % IV SOLN
250.0000 mL | INTRAVENOUS | Status: DC | PRN
  Filled 2019-02-06: qty 250

## 2019-02-06 MED ORDER — FENTANYL CITRATE (PF) 100 MCG/2ML IJ SOLN
INTRAMUSCULAR | Status: AC
  Filled 2019-02-06: qty 2

## 2019-02-06 MED ORDER — STERILE WATER FOR IRRIGATION IR SOLN
Status: DC | PRN
  Administered 2019-02-06: 3000 mL

## 2019-02-06 MED ORDER — MORPHINE SULFATE (PF) 2 MG/ML IV SOLN
2.0000 mg | INTRAVENOUS | Status: DC | PRN
  Filled 2019-02-06: qty 1

## 2019-02-06 MED ORDER — OXYCODONE HCL 5 MG PO TABS
5.0000 mg | ORAL_TABLET | ORAL | Status: DC | PRN
  Filled 2019-02-06: qty 2

## 2019-02-06 MED ORDER — MEPERIDINE HCL 25 MG/ML IJ SOLN
6.2500 mg | INTRAMUSCULAR | Status: DC | PRN
  Filled 2019-02-06: qty 1

## 2019-02-06 MED ORDER — OXYCODONE HCL 5 MG/5ML PO SOLN
5.0000 mg | Freq: Once | ORAL | Status: AC | PRN
  Filled 2019-02-06: qty 5

## 2019-02-06 MED ORDER — SODIUM CHLORIDE 0.9% FLUSH
3.0000 mL | INTRAVENOUS | Status: DC | PRN
  Filled 2019-02-06: qty 3

## 2019-02-06 MED ORDER — ONDANSETRON HCL 4 MG/2ML IJ SOLN
INTRAMUSCULAR | Status: AC
  Filled 2019-02-06: qty 2

## 2019-02-06 MED FILL — OXYCODONE-ACETAMINOPHEN 5-3: 5-325 | 7 days supply | Qty: 30 | Fill #0

## 2019-02-06 SURGICAL SUPPLY — 27 items
BAG DRAIN URO-CYSTO SKYTR STRL (DRAIN) ×3 IMPLANT
BASKET STONE 1.7 NGAGE (UROLOGICAL SUPPLIES) IMPLANT
BASKET ZERO TIP NITINOL 2.4FR (BASKET) IMPLANT
CATH URET 5FR 28IN CONE TIP (BALLOONS)
CATH URET 5FR 28IN OPEN ENDED (CATHETERS) IMPLANT
CATH URET 5FR 70CM CONE TIP (BALLOONS) IMPLANT
CLOTH BEACON ORANGE TIMEOUT ST (SAFETY) ×3 IMPLANT
ELECT REM PT RETURN 9FT ADLT (ELECTROSURGICAL)
ELECTRODE REM PT RTRN 9FT ADLT (ELECTROSURGICAL) IMPLANT
FIBER LASER FLEXIVA 365 (UROLOGICAL SUPPLIES) IMPLANT
FIBER LASER TRAC TIP (UROLOGICAL SUPPLIES) IMPLANT
GLOVE BIO SURGEON STRL SZ7 (GLOVE) ×3 IMPLANT
GLOVE BIOGEL PI IND STRL 7.0 (GLOVE) ×1 IMPLANT
GLOVE BIOGEL PI INDICATOR 7.0 (GLOVE) ×2
GLOVE SURG SS PI 8.0 STRL IVOR (GLOVE) ×3 IMPLANT
GOWN STRL REUS W/TWL XL LVL3 (GOWN DISPOSABLE) ×6 IMPLANT
GUIDEWIRE ANG ZIPWIRE 038X150 (WIRE) ×3 IMPLANT
GUIDEWIRE STR DUAL SENSOR (WIRE) ×3 IMPLANT
IV NS IRRIG 3000ML ARTHROMATIC (IV SOLUTION) IMPLANT
KIT TURNOVER CYSTO (KITS) ×3 IMPLANT
MANIFOLD NEPTUNE II (INSTRUMENTS) ×3 IMPLANT
NS IRRIG 500ML POUR BTL (IV SOLUTION) ×3 IMPLANT
PACK CYSTO (CUSTOM PROCEDURE TRAY) ×3 IMPLANT
STENT POLARIS LOOP 7FR X 24 CM (STENTS) ×3 IMPLANT
TUBE CONNECTING 12'X1/4 (SUCTIONS) ×1
TUBE CONNECTING 12X1/4 (SUCTIONS) ×2 IMPLANT
TUBING UROLOGY SET (TUBING) IMPLANT

## 2019-02-06 NOTE — Discharge Instructions (Addendum)
Ureteral Stent Implantation, Care After Refer to this sheet in the next few weeks. These instructions provide you with information about caring for yourself after your procedure. Your health care provider may also give you more specific instructions. Your treatment has been planned according to current medical practices, but problems sometimes occur. Call your health care provider if you have any problems or questions after your procedure. What can I expect after the procedure? After the procedure, it is common to have:  Nausea.  Mild pain when you urinate. You may feel this pain in your lower back or lower abdomen. Pain should stop within a few minutes after you urinate. This may last for up to 1 week.  A small amount of blood in your urine for several days. Follow these instructions at home:  Medicines  Take over-the-counter and prescription medicines only as told by your health care provider.  If you were prescribed an antibiotic medicine, take it as told by your health care provider. Do not stop taking the antibiotic even if you start to feel better.  Do not drive for 24 hours if you received a sedative.  Do not drive or operate heavy machinery while taking prescription pain medicines. Activity  Return to your normal activities as told by your health care provider. Ask your health care provider what activities are safe for you.  Do not lift anything that is heavier than 10 lb (4.5 kg). Follow this limit for 1 week after your procedure, or for as long as told by your health care provider. General instructions  Watch for any blood in your urine. Call your health care provider if the amount of blood in your urine increases.  If you have a catheter: ? Follow instructions from your health care provider about taking care of your catheter and collection bag. ? Do not take baths, swim, or use a hot tub until your health care provider approves.  Drink enough fluid to keep your urine  clear or pale yellow.  Keep all follow-up visits as told by your health care provider. This is important. Contact a health care provider if:  You have pain that gets worse or does not get better with medicine, especially pain when you urinate.  You have difficulty urinating.  You feel nauseous or you vomit repeatedly during a period of more than 2 days after the procedure. Get help right away if:  Your urine is dark red or has blood clots in it.  You are leaking urine (have incontinence).  The end of the stent comes out of your urethra.  You cannot urinate.  You have sudden, sharp, or severe pain in your abdomen or lower back.  You have a fever. This information is not intended to replace advice given to you by your health care provider. Make sure you discuss any questions you have with your health care provider. Document Released: 08/09/2013 Document Revised: 05/14/2016 Document Reviewed: 06/21/2015 Elsevier Interactive Patient Education  2019 [place].  Avoid Tori Milks ,motrin until after 6 pm  Post Anesthesia Home Care Instructions  Activity: Get plenty of rest for the remainder of the day. A responsible individual must stay with you for 24 hours following the procedure.  For the next 24 hours, DO NOT: -Drive a car -Paediatric nurse -Drink alcoholic beverages -Take any medication unless instructed by your physician -Make any legal decisions or sign important papers.  Meals: Start with liquid foods such as gelatin or soup. Progress to regular foods as tolerated. Avoid  greasy, spicy, heavy foods. If nausea and/or vomiting occur, drink only clear liquids until the nausea and/or vomiting subsides. Call your physician if vomiting continues.  Special Instructions/Symptoms: Your throat may feel dry or sore from the anesthesia or the breathing tube placed in your throat during surgery. If this causes discomfort, gargle with warm salt water. The discomfort should disappear  within 24 hours.  If you had a scopolamine patch placed behind your ear for the management of post- operative nausea and/or vomiting:  1. The medication in the patch is effective for 72 hours, after which it should be removed.  Wrap patch in a tissue and discard in the trash. Wash hands thoroughly with soap and water. 2. You may remove the patch earlier than 72 hours if you experience unpleasant side effects which may include dry mouth, dizziness or visual disturbances. 3. Avoid touching the patch. Wash your hands with soap and water after contact with the patch.

## 2019-02-06 NOTE — Interval H&P Note (Signed)
History and Physical Interval Note:  02/06/2019 11:31 AM  [name] Johnston  has presented today for surgery, with the diagnosis of LEFT URETERAL OBSTRUCTION  The various methods of treatment have been discussed with the patient and family. After consideration of risks, benefits and other options for treatment, the patient has consented to  Procedure(s): CYSTOSCOPY WITH LEFT STENT REPLACEMENT (Left) as a surgical intervention .  The patient's history has been reviewed, patient examined, no change in status, stable for surgery.  I have reviewed the patient's chart and labs.  Questions were answered to the patient's satisfaction.     [name]

## 2019-02-06 NOTE — Anesthesia Procedure Notes (Signed)
Procedure Name: LMA Insertion Date/Time: 02/06/2019 11:40 AM Performed by: Gwyndolyn Saxon, CRNA Pre-anesthesia Checklist: Patient identified, Emergency Drugs available, Suction available and Patient being monitored Patient Re-evaluated:Patient Re-evaluated prior to induction Oxygen Delivery Method: Circle system utilized Preoxygenation: Pre-oxygenation with 100% oxygen Induction Type: IV induction Ventilation: Mask ventilation without difficulty LMA: LMA inserted LMA Size: 3.0 Number of attempts: 1 Placement Confirmation: positive ETCO2 and breath sounds checked- equal and bilateral Tube secured with: Tape Dental Injury: Teeth and Oropharynx as per pre-operative assessment

## 2019-02-06 NOTE — Transfer of Care (Signed)
Immediate Anesthesia Transfer of Care Note  Patient: [name] Johnston  Procedure(s) Performed: CYSTOSCOPY WITH LEFT STENT PLACEMENT, RETROGRADE (Left Ureter)  Patient Location: PACU  Anesthesia Type:General  Level of Consciousness: drowsy  Airway & Oxygen Therapy: Patient Spontanous Breathing and Patient connected to nasal cannula oxygen  Post-op Assessment: Report given to RN and Post -op Vital signs reviewed and stable  Post vital signs: Reviewed and stable  Last Vitals:  Vitals Value Taken Time  BP 147/87 02/06/2019 12:09 PM  Temp    Pulse 72 02/06/2019 12:13 PM  Resp 13 02/06/2019 12:13 PM  SpO2 98 % 02/06/2019 12:13 PM  Vitals shown include unvalidated device data.  Last Pain:  Vitals:   02/06/19 0845  TempSrc: Oral         Complications: No apparent anesthesia complications

## 2019-02-06 NOTE — Anesthesia Preprocedure Evaluation (Addendum)
Anesthesia Evaluation  Patient identified by MRN, date of birth, ID band Patient awake    Reviewed: Allergy & Precautions, NPO status , Patient's Chart, lab work & pertinent test results  Airway Mallampati: II  TM Distance: >3 FB Neck ROM: Full    Dental no notable dental hx. (+) Teeth Intact   Pulmonary asthma ,    Pulmonary exam normal breath sounds clear to auscultation       Cardiovascular hypertension, Normal cardiovascular exam Rhythm:Regular Rate:Normal     Neuro/Psych negative neurological ROS  negative psych ROS   GI/Hepatic Neg liver ROS, GERD  Medicated and Controlled,  Endo/Other  negative endocrine ROS  Renal/GU Renal InsufficiencyRenal diseaseLeft ureteral obstruction with left hydronephrosis   Hx/o prostate Ca    Musculoskeletal negative musculoskeletal ROS (+)   Abdominal   Peds  Hematology  (+) anemia ,   Anesthesia Other Findings   Reproductive/Obstetrics                            Anesthesia Physical Anesthesia Plan  ASA: III  Anesthesia Plan: General   Post-op Pain Management:    Induction: Intravenous  PONV Risk Score and Plan: 4 or greater and Ondansetron, Dexamethasone and Treatment may vary due to age or medical condition  Airway Management Planned: LMA  Additional Equipment:   Intra-op Plan:   Post-operative Plan: Extubation in OR  Informed Consent: I have reviewed the patients History and Physical, chart, labs and discussed the procedure including the risks, benefits and alternatives for the proposed anesthesia with the patient or authorized representative who has indicated his/her understanding and acceptance.     Dental advisory given  Plan Discussed with: CRNA and Surgeon  Anesthesia Plan Comments:         Anesthesia Quick Evaluation

## 2019-02-06 NOTE — Anesthesia Postprocedure Evaluation (Signed)
Anesthesia Post Note  Patient: [name] Johnston  Procedure(s) Performed: CYSTOSCOPY WITH LEFT STENT PLACEMENT, RETROGRADE (Left Ureter)     Patient location during evaluation: PACU Anesthesia Type: General Level of consciousness: awake and alert and oriented Pain management: pain level controlled Vital Signs Assessment: post-procedure vital signs reviewed and stable Respiratory status: spontaneous breathing, nonlabored ventilation and respiratory function stable Cardiovascular status: blood pressure returned to baseline and stable Postop Assessment: no apparent nausea or vomiting Anesthetic complications: no    Last Vitals:  Vitals:   02/06/19 1245 02/06/19 1247  BP: (!) 159/91 (!) 159/91  Pulse: 73 73  Resp: (!) 21 14  Temp:    SpO2: 100% 100%    Last Pain:  Vitals:   02/06/19 1230  TempSrc:   PainSc: 4                  Kito Cuffe A.

## 2019-02-06 NOTE — Op Note (Signed)
Procedure: 1.  Cystoscopy with left retrograde pyelogram and interpretation. 2.  Insertion of left double-J stent.  Preop diagnosis: Malignant obstruction of left ureter.  Postop diagnosis: Same.  Surgeon: Dr. [name].  Anesthesia: General.  Specimen: None.  Drains: 7 [place] by 24 cm Polaris left ureteral stent.  EBL: None.  Complications: None.  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  He had previously been managed with a stent, but was having pain and had responded to chemotherapy with a good decline in his PSA so it was felt that  a trial without stenting was indicated, but he failed the trial and has recurrent severe obstruction.  Procedure: He was given 2 g of Ancef.  He was taken operating room where general anesthetic was induced.  He was placed in lithotomy position and fitted with PAS hose.  His perineum and genitalia were prepped with Betadine solution he was draped in usual sterile fashion.  Cystoscopy was performed using a 23 [place] scope and 30 degree lens.  Examination reveals a normal urethra.  The external sphincter is intact.  The prostatic urethra was short with evidence of prior resection but no obvious tumor.  The right ureteral orifice was unremarkable.  The left ureteral orifice was somewhat lateral and more [place].  The bladder wall was smooth without tumors or stones.  The left ureteral orifice was cannulated with a 5 [place] open ended catheter and contrast was instilled.  Left retrograde pyelogram revealed 2 areas obstruction in the distal ureter consistent with extrinsic compression with proximal dilation.  A zip wire was passed through the opening catheter and easily advanced to the kidney.  The open-ended catheter was removed and a 7 [place] by 24 cm Polaris stent was advanced to the kidney under fluoroscopic guidance.  The wire was removed, leaving a good coil in the  kidney and the loops well positioned in the bladder.  The bladder was drained and the cystoscope was removed.  His anesthetic was reversed, he was taken down from lithotomy position and moved to recovery in stable condition.  There were no complications.

## 2019-02-06 NOTE — H&P (Signed)
CC: I have hydronephrosis.  HPI: [name] Johnston is a 68 year-old male established patient who is here for hydronephrosis.  The problem is on the left side. He had the following x-rays done: Renal Ultrasound and Nuclear renal scan.   He is not currently having flank pain, back pain, groin pain, nausea, vomiting, fever or chills.   02/01/19: [name] Johnston returns today in f/u He had left hydro on an [place] on 01/23/19 after removal of the left ureteral stent. He had a renogram to further assess the situation and he had high grade obstruction with only 9% function. He has no pain in the flank or fever. He has had no hematuria.    1/28/20[name] returns today in f/u for cystoscopy and stent removal of a left ureteral stent initially placed for malignant obstruction of the left distal ureter. He has had a favorable PSA response to chemotherapy and on CT I no longer appreciate a mass effect in the area of the left distal ureter. He is symptomatic with the stent so a trial without stent is indicated.     ALLERGIES: Aspirin Low Dose TABS    MEDICATIONS: Tylenol     GU PSH: Cysto Dilate Stricture (M or F) - 01/25/2018 Cysto Remove Stent FB Sim - 01/17/2019 Cystoscopy Insert Stent, Left - 10/11/2018, Left - 06/21/2018, Left - 01/25/2018 Cystoscopy TURP - 2014 Hydrocele repair, Left - 01/25/2018 Remove Prostate Regrowth - 2015 Simple orchiectomy, Bilateral - 01/25/2018    NON-GU PSH: None   GU PMH: Ureteral obstruction - 01/23/2019, - 01/17/2019 Prostate Cancer, He has responded to current therapy with a decline in the PSA to 1.6. On the CT in December I don't see the mass effect in the area of the distal ureter on the left. With his persistent hematuria and voiding symptoms, I am going to have him return for stent removal with the understanding that we may either have to replace the stent or a perc tube if the hydro recurs. - 01/11/2019, - 11/01/2018, - 10/13/2018, He has CRCP with visceral mets but is responding to [place]. , -  10/04/2018 (Worsening), His PSA is rising on ADT and Abiraterone. , - 06/13/2018, - 02/09/2018 (Worsening), He has CRCP with progression on Zytiga and Lupron with prior docetaxal. 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 I am going to get him set up for a left hydrocelectomy and bilateral orchiectomy which will eliminate the need for lupron. I have reviewed the risks of bleeding, infection, recurrent hydrocele, thrombotic events and anesthetic complications. , - 01/21/2018 Chronic kidney disease stage 3 (GFR 30-60) - 01/21/2018 Hydronephrosis, He has malignant left ureteral obstruction with some pain and progressive AKI. I will do cystoscopy with stenting at the time of the hydrocelectomy. I reviewed this additional risks including inability to place a stent. I am not sure I would recommend a perc if I can't get a stent up with his progressive CRCP. 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 He had radiation and has been on hormonal therapy since July 2014. Testosterone has confirmed to be at castrate levels. PSA was 0.33 in Feb 2015.       NON-GU PMH: Encounter for general adult medical examination without abnormal findings, Encounter for preventive health examination - 2017 Asthma, Asthma - 2014    FAMILY HISTORY: Family Health Status - Father alive at age 10 - Father Family Health Status - Mother's Age - Mother Family Health Status Number - Runs In Family No Significant Family History - Runs In Family   SOCIAL HISTORY: Marital Status: Married Ethnicity: Not Hispanic Or Latino; Race: Unknown Current Smoking Status: Patient has never smoked.  Has never drank.  Does not drink caffeine.    REVIEW OF SYSTEMS:    GU Review Male:   Patient reports burning/ pain with urination. Patient denies frequent urination, hard to postpone urination, get up at night to urinate, leakage of urine, stream starts and stops, trouble starting your stream, have to strain to urinate , erection problems, and penile pain.  Gastrointestinal (Upper):   Patient denies nausea, vomiting, and indigestion/ heartburn.  Gastrointestinal (Lower):   Patient denies diarrhea and constipation.  Constitutional:   Patient denies fever, night sweats, weight loss, and fatigue.  Skin:   Patient denies skin rash/ lesion and itching.  Eyes:   Patient denies blurred vision and double vision.  Ears/ Nose/ Throat:   Patient denies sore throat and sinus problems.  Hematologic/Lymphatic:   Patient denies swollen glands and easy bruising.  Cardiovascular:   Patient denies leg swelling and chest pains.  Respiratory:   Patient denies cough and shortness of breath.  Endocrine:   Patient denies excessive thirst.  Musculoskeletal:   Patient denies back pain and joint pain.  Neurological:   Patient denies headaches and dizziness.   Psychologic:   Patient denies depression and anxiety.   VITAL SIGNS:      02/01/2019 09:15 AM  BP 128/84 mmHg  Pulse 83 /min  Temperature 98.1 F / 36.7 C   MULTI-SYSTEM PHYSICAL EXAMINATION:    Constitutional: Well-nourished. No physical deformities. Normally developed. Good grooming.  Respiratory: Normal breath sounds. No labored breathing, no use of accessory muscles.   Cardiovascular: Regular rate and rhythm. No murmur, no gallop.      PAST DATA REVIEWED:  Source Of History:  Patient  Urine Test Review:   Urinalysis  X-Ray Review: Renal Ultrasound: Reviewed Films. Discussed With Patient.  Lasix Renogram: Reviewed Films. Reviewed Report. Discussed With Patient.     12/02/18 09/08/18 03/15/15 10/30/14 06/19/14 02/13/14 11/07/13 08/11/13  PSA  Total PSA 1.6 ng/dl 3.3 ng/dl 79.34  11.08  0.44  0.33  0.76  2.97     03/14/15 10/29/14 02/13/14 11/07/13 08/11/13  Hormones  Testosterone, Total _0 33  30     PROCEDURES:          Urinalysis w/Scope Dipstick Dipstick Cont'd Micro  Color: Straw Bilirubin: Neg mg/dL WBC/hpf:  NS (Not Seen)  Appearance: Clear Ketones: Neg mg/dL RBC/hpf: 10 - 20/hpf  Specific Gravity: 1.015 Blood: 3+ ery/uL Bacteria: NS (Not Seen)  pH: 6.0 Protein: Neg mg/dL Cystals: NS (Not Seen)  Glucose: Neg mg/dL Urobilinogen: 0.2 mg/dL Casts: NS (Not Seen)    Nitrites: Neg Trichomonas: Not Present    Leukocyte Esterase: Neg leu/uL Mucous: Not Present      Epithelial Cells: NS (Not Seen)      Yeast: NS (Not Seen)      Sperm: Not Present    ASSESSMENT:      ICD-10 Details  1 GU:   Ureteral obstruction - N13.1 Worsening - He has severe obstruction of the left ureter and after reviewing the options. I am going to replace the stent. I reviewed the risks in detail including the possible need for a perc tube. I will get this set up for next week.   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    PLAN:            Schedule Return Visit/Planned Activity: ASAP - Schedule Surgery          Document Letter(s):  Created for Patient: Clinical Summary

## 2019-02-07 ENCOUNTER — Encounter (HOSPITAL_BASED_OUTPATIENT_CLINIC_OR_DEPARTMENT_OTHER): Payer: Self-pay | Admitting: Urology

## 2019-02-20 DIAGNOSIS — N131 Hydronephrosis with ureteral stricture, not elsewhere classified: Secondary | ICD-10-CM | POA: Diagnosis not present

## 2019-02-20 DIAGNOSIS — C7919 Secondary malignant neoplasm of other urinary organs: Secondary | ICD-10-CM | POA: Diagnosis not present

## 2019-02-20 DIAGNOSIS — N130 Hydronephrosis with ureteropelvic junction obstruction: Secondary | ICD-10-CM | POA: Diagnosis not present

## 2019-02-20 DIAGNOSIS — R351 Nocturia: Secondary | ICD-10-CM | POA: Diagnosis not present

## 2019-03-07 ENCOUNTER — Telehealth: Payer: Self-pay | Admitting: Oncology

## 2019-03-07 ENCOUNTER — Telehealth: Payer: Self-pay

## 2019-03-07 ENCOUNTER — Inpatient Hospital Stay (HOSPITAL_BASED_OUTPATIENT_CLINIC_OR_DEPARTMENT_OTHER): Payer: PPO | Admitting: Oncology

## 2019-03-07 ENCOUNTER — Other Ambulatory Visit: Payer: Self-pay

## 2019-03-07 ENCOUNTER — Inpatient Hospital Stay: Payer: PPO

## 2019-03-07 ENCOUNTER — Telehealth: Payer: Self-pay | Admitting: Pharmacist

## 2019-03-07 ENCOUNTER — Inpatient Hospital Stay: Payer: PPO | Attending: Oncology

## 2019-03-07 VITALS — BP 122/78 | HR 91 | Temp 98.6°F | Resp 18 | Ht 62.0 in | Wt 130.4 lb

## 2019-03-07 DIAGNOSIS — Z95828 Presence of other vascular implants and grafts: Secondary | ICD-10-CM

## 2019-03-07 DIAGNOSIS — D6481 Anemia due to antineoplastic chemotherapy: Secondary | ICD-10-CM | POA: Insufficient documentation

## 2019-03-07 DIAGNOSIS — C787 Secondary malignant neoplasm of liver and intrahepatic bile duct: Secondary | ICD-10-CM

## 2019-03-07 DIAGNOSIS — C61 Malignant neoplasm of prostate: Secondary | ICD-10-CM | POA: Diagnosis not present

## 2019-03-07 LAB — CMP (CANCER CENTER ONLY)
ALT: 26 U/L (ref 0–44)
AST: 20 U/L (ref 15–41)
Albumin: 3.6 g/dL (ref 3.5–5.0)
Alkaline Phosphatase: 133 U/L — ABNORMAL HIGH (ref 38–126)
Anion gap: 10 (ref 5–15)
BUN: 12 mg/dL (ref 8–23)
CO2: 25 mmol/L (ref 22–32)
CREATININE: 0.87 mg/dL (ref 0.61–1.24)
Calcium: 9 mg/dL (ref 8.9–10.3)
Chloride: 107 mmol/L (ref 98–111)
GFR, Est AFR Am: >60 mL/min (ref >60)
GFR, Estimated: >60 mL/min (ref >60)
Glucose, Bld: 97 mg/dL (ref 70–99)
Potassium: 3.7 mmol/L (ref 3.5–5.1)
Sodium: 142 mmol/L (ref 135–145)
Total Bilirubin: 0.4 mg/dL (ref 0.3–1.2)
Total Protein: 7.2 g/dL (ref 6.5–8.1)

## 2019-03-07 LAB — CBC WITH DIFFERENTIAL (CANCER CENTER ONLY)
Abs Immature Granulocytes: 0.01 10*3/uL (ref 0.00–0.07)
Basophils Absolute: 0 10*3/uL (ref 0.0–0.1)
Basophils Relative: 1 %
EOS PCT: 9 %
Eosinophils Absolute: 0.4 10*3/uL (ref 0.0–0.5)
HCT: 36.2 % — ABNORMAL LOW (ref 39.0–52.0)
HEMOGLOBIN: 11.7 g/dL — AB (ref 13.0–17.0)
Immature Granulocytes: 0 %
LYMPHS PCT: 23 %
Lymphs Abs: 1.1 10*3/uL (ref 0.7–4.0)
MCH: 26.1 pg (ref 26.0–34.0)
MCHC: 32.3 g/dL (ref 30.0–36.0)
MCV: 80.6 fL (ref 80.0–100.0)
Monocytes Absolute: 0.3 10*3/uL (ref 0.1–1.0)
Monocytes Relative: 7 %
Neutro Abs: 2.8 10*3/uL (ref 1.7–7.7)
Neutrophils Relative %: 60 %
Platelet Count: 212 10*3/uL (ref 150–400)
RBC: 4.49 MIL/uL (ref 4.22–5.81)
RDW: 13.4 % (ref 11.5–15.5)
WBC Count: 4.7 10*3/uL (ref 4.0–10.5)
nRBC: 0 % (ref 0.0–0.2)

## 2019-03-07 MED ORDER — SODIUM CHLORIDE 0.9% FLUSH
10.0000 mL | Freq: Once | INTRAVENOUS | Status: AC
  Administered 2019-03-07: 10 mL
  Filled 2019-03-07: qty 10

## 2019-03-07 MED ORDER — ENZALUTAMIDE 40 MG PO CAPS: 160.0000 mg | ORAL_CAPSULE | Freq: Every day | ORAL | 0 refills | Status: DC

## 2019-03-07 MED ORDER — HEPARIN SOD (PORK) LOCK FLUSH 100 UNIT/ML IV SOLN
500.0000 [IU] | Freq: Once | INTRAVENOUS | Status: AC
  Administered 2019-03-07: 500 [IU]
  Filled 2019-03-07: qty 5

## 2019-03-07 NOTE — Telephone Encounter (Signed)
Oral Oncology Patient Advocate Lauro Regulus copay is [phone]. This is not affordable for the patient. There are currently no grant funds available for his disease state but I will continue to watch for a grant to open. However, I can assist the patient in applying for manufacturer assistance with Xtandi support solutions and if approved the medicine would be shipped to his home free of charge.   I spoke with the patient and translator while in the office today and he agreed to proceed with the manufacturer assistance application. He signed the application today.  I faxed the completed application today, 07/23/20.  This encounter will be updated until final determination.  [facility] Patient [place] Phone [phone] Fax [phone] 03/07/2019   11:11 AM

## 2019-03-07 NOTE — Telephone Encounter (Signed)
Gave avs and calendar ° °

## 2019-03-07 NOTE — Telephone Encounter (Signed)
Oral Chemotherapy Pharmacist Encounter   I spoke with patient in scheduling area, with the aid of an interpreter, for overview of: Xtandi (enzalutamide) for the treatment of metastatic, castration-resistant prostate cancer, planned duration until disease progression or unacceptable toxicity.   Counseled patient on administration, dosing, side effects, monitoring, drug-food interactions, safe handling, storage, and disposal.  Patient will take Xtandi 68m capsules, 4 capsules (1670m by mouth once daily without regard to food.  Xtandi start date: TBD, pending medication acquisition  Adverse effects include but are not limited to: peripheral edema, GI upset, hypertension, hot flashes, fatigue, falls/fractures, and arthralgias.   Patient instructed about small risk of seizures with Xtandi treatment.  Reviewed with patient importance of keeping a medication schedule and plan for any missed doses.  Mr. KbClinton Sawyeroiced understanding and appreciation.   All questions answered. Medication reconciliation performed and medication/allergy list updated.  Insurance authorization has been submitted. We will follow-up with patient's daughter once insurance authorization is approved and copayment is known.  Patient also met with oral oncology patient advocate for description of grant foundations and manufacturer assistance process.  Patient knows to call the office with questions or concerns.  Oral Oncology Clinic will continue to follow.  JeJohny DrillingPharmD, BCPS, BCOP  03/07/2019   10:04 AM Oral Oncology Clinic [phone]

## 2019-03-07 NOTE — Telephone Encounter (Signed)
Oral Oncology Patient Advocate Encounter  Received notification from [facility] that prior authorization for [name] is required.  PA submitted on CoverMyMeds Key [id number] Status is pending  Oral Oncology Clinic will continue to follow.  [name] Phone [phone] Fax [phone] 03/07/2019    10:37 AM

## 2019-03-07 NOTE — Telephone Encounter (Signed)
Oral Oncology Patient Advocate Encounter  Prior Authorization for [name] Johnston has been approved.    PA# [id number] Effective dates: 03/07/19 through 03/06/20  Oral Oncology Clinic will continue to follow.   [name] Patient [place] Phone [phone] Fax [phone] 03/07/2019    11:05 AM

## 2019-03-07 NOTE — Telephone Encounter (Signed)
Oral Oncology Pharmacist Encounter  Received new prescription for Xtandi (enzalutamide) for the treatment of metastatic, castration-resistant prostate cancer, planned duration until disease progression or unacceptable toxicity.  Patient has previously been treated with docetaxel x 10 cycles in 2016 Patient then received Zytiga 2017-2019, discontinued secondary to disease progression. Patient is s/p orchiectomy in Feb 2019 Most recently patient completed cabazitaxel x 6 cycles Aug-Nov 2019, discontinued secondary to patient preference  Patient is now under evaluation to start Xtandi 40mg  capsules, 4 capsules (160mg ) by mouth once daily as salvage terapy  Labs from 03/07/19 assessed, OK for treatment.  Current medication list in Epic reviewed, interaction with Xtandi and Percocet identified:  Category D interaction: [name] is an inducer of CYP3A4 leading to possible increased metabolism and decreased systemic exposure to oxycodone component of Percocet. Patient will be counseled about possible loss of efficacy in pain control and need to change current pain regimen. No change to therapy is indicated at this time.  Prescription has been e-scribed to the [facility] for benefits analysis and approval.  Oral Oncology Clinic will continue to follow for insurance authorization, copayment issues, initial counseling and start date.  Johny Drilling, PharmD, BCPS, BCOP  03/07/2019 9:53 AM Oral Oncology Clinic [phone]

## 2019-03-07 NOTE — Progress Notes (Signed)
Hematology and Oncology Follow Up Visit  [name] Johnston [id number] 04/06/1951 68 y.o. 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  He presented with a Gleason score of 8 and a PSA of 40 at the time of diagnosis.  Prior Therapy:  He is started [place] on 06/27/2013. He subsequently received definitive radiation therapy for a planned dose of 75 gray completed in November 2014 under the care of Dr. Tammi Klippel.   He developed castration resistant disease with biopsy-proven liver metastasis in May 2016.  Docetaxel 75 mg/m given every 3 weeks with Neulasta support. He is status post 10 cycles of treatment concluded on 11/21/2015. He had an excellent PSA response with PSA dropping from 222 to 0.17.   PSA started to rise again with PSA up to 8.9 in August 2017.  He is status post orchiectomy completed in February 2019.  Zytiga 1000 mg with prednisone since September 2017.   Jevtana chemotherapy given at 20 mg/m every 3 weeks cycle 1 started on 07/29/2018.  He completed 6 cycles of therapy in November 2019.  Therapy discontinued because of patient's preference.  Current therapy: Under consideration to start different salvage therapy.  Interim History:  [name] returns today for a follow-up visit.  Since the last visit, he reports few complaints although overall his health has been relatively stable.  He denies any worsening bone pain or pathological fractures.  He denies any recent falls or syncope.  He is appetite has been marginal and has lost some weight.  He underwent double-J stent replacement on February 17 under the care of Dr. Jeffie Pollock without any complications.  He did report his flank pain has improved since that time.  He denies any recent hospitalizations or illnesses.  He remains ambulatory with adequate performance status.  Patient denied headaches,  blurry vision, syncope or seizures.  Denies any fevers, chills or sweats.  Denied chest pain, palpitation, orthopnea or leg edema.  Denied cough, wheezing or hemoptysis.  Denied nausea, vomiting or abdominal pain.  Denies any constipation or diarrhea.  Denies any frequency urgency or hesitancy.  Denies any arthralgias or myalgias.  Denies any skin rashes or lesions.  Denies any bleeding or clotting tendency.  Denies any easy bruising.  Denies any hair or nail changes.  Denies any anxiety or depression.  Remaining review of system is negative.   Medications: I have reviewed the patient's current medications.   Current Outpatient Medications  Medication Sig Dispense Refill  . acetaminophen (TYLENOL) 500 MG tablet Take 500-1,000 mg by mouth every 6 (six) hours as needed.     . enzalutamide (XTANDI) 40 MG capsule Take 4 capsules (160 mg total) by mouth daily. 120 capsule 0  . oxyCODONE-acetaminophen (PERCOCET/ROXICET) 5-325 MG tablet Take 1 tablet by mouth every 6 (six) hours as needed for severe pain. 30 tablet 0   No current facility-administered medications for this visit.    Facility-Administered Medications Ordered in Other Visits  Medication Dose Route Frequency Provider Last Rate Last Dose  . heparin lock flush 100 unit/mL  500 Units Intracatheter Once Wyatt Portela, MD      . sodium chloride flush (NS) 0.9 % injection 10 mL  10 mL Intracatheter Once Wyatt Portela, MD      . sodium chloride flush (NS) 0.9 % injection 10 mL  10 mL Intracatheter PRN Wyatt Portela, MD   10  mL at 09/30/18 1236     Allergies:  Allergies  Allergen Reactions  . Aspirin Hives    Past Medical History, Surgical history, Social history, and Family History were reviewed and updated.    Physical Exam:  Blood pressure 122/78, pulse 91, temperature 98.6 F (37 C), temperature source Oral, resp. rate 18, height 5\' 2"  (1.575 m), weight 130 lb 6.4 oz (59.1 kg), SpO2 99 %.      ECOG: 1   General  appearance: Alert, awake without any distress. Head: Atraumatic without abnormalities Oropharynx: Without any thrush or ulcers. Eyes: No scleral icterus. Lymph nodes: No lymphadenopathy noted in the cervical, supraclavicular, or axillary nodes Heart:regular rate and rhythm, without any murmurs or gallops.   Lung: Clear to auscultation without any rhonchi, wheezes or dullness to percussion. Abdomin: Soft, nontender without any shifting dullness or ascites. Musculoskeletal: No clubbing or cyanosis. Neurological: No motor or sensory deficits. Skin: No rashes or lesions. Psychiatric: Mood and affect appeared normal.             Lab Results: Lab Results  Component Value Date   WBC 4.7 03/07/2019   HGB 11.7 (L) 03/07/2019   HCT 36.2 (L) 03/07/2019   MCV 80.6 03/07/2019   PLT 212 03/07/2019     Chemistry      Component Value Date/Time   NA 142 03/07/2019 0905   NA 142 12/17/2017 1303   K 3.7 03/07/2019 0905   K 3.2 (L) 12/17/2017 1303   CL 107 03/07/2019 0905   CO2 25 03/07/2019 0905   CO2 28 12/17/2017 1303   BUN 12 03/07/2019 0905   BUN 14.0 12/17/2017 1303   CREATININE 0.87 03/07/2019 0905   CREATININE 1.1 12/17/2017 1303      Component Value Date/Time   CALCIUM 9.0 03/07/2019 0905   CALCIUM 9.0 12/17/2017 1303   ALKPHOS 133 (H) 03/07/2019 0905   ALKPHOS 85 12/17/2017 1303   AST 20 03/07/2019 0905   AST 12 12/17/2017 1303   ALT 26 03/07/2019 0905   ALT 15 12/17/2017 1303   BILITOT 0.4 03/07/2019 0905   BILITOT 0.41 12/17/2017 1303      Results for LIDO, MASKE (MRN [id number]) as of 03/07/2019 09:34  Ref. Range 12/02/2018 11:00 01/12/2019 14:41  Prostate Specific Ag, Serum Latest Ref Range: 0.0 - 4.0 ng/mL 1.6 5.9 (H)    Impression and Plan:  68 year old man with:  1.  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    His disease status was updated and treatment options were reiterated today with the patient via an  interpreter.  He had a reasonable response to [facility] chemotherapy although he has tolerated chemotherapy poorly and prefers not to restart it.  Alternative option would be continued supportive care versus a trial of Xtandi.  He understands that given his previous exposure to [facility] might not have robust response at this time.  Risks and benefits associated with Xtandi therapy was reviewed today.  Dose of 160 mg carries the risk of fatigue, nausea, hypertension, hematuria and rarely seizures.  After discussion today is agreeable to proceed.   2.  Hepatic metastasis: Asymptomatic from these findings at this time.  Imaging studies in December showed regression.  We will repeat these scans in 3 months.  3.  IV access: Port-A-Cath will be flushed periodically and remains in use.  4.  Left ureteral obstruction: Status post stent replacement in February 2020 under the care of Dr. Jeffie Pollock.  He function remains  normal.  5. Prognosis: Therapy remains palliative and he understands he has incurable cancer that should be adequately palliated at this time given his reasonable performance status.  6.  Anemia: Related to chemotherapy and continues to improve at this time.  No intervention is needed.  7. Follow-up: Will be in 4 to 5 weeks to follow his progress.  25  minutes was spent with the patient face-to-face today.  More than 50% of time was spent on updating his disease status, treatment options, addressing complications related to therapy and prognosis.    Zola Button, MD 3/17/202010:15 AM

## 2019-03-08 LAB — PROSTATE-SPECIFIC AG, SERUM (LABCORP): Prostate Specific Ag, Serum: 14.9 ng/mL — ABNORMAL HIGH (ref 0.0–4.0)

## 2019-03-08 NOTE — Telephone Encounter (Signed)
Oral Oncology Patient Advocate Encounter  I received a call from [place] with [name] today and he stated that they are processing the application to consider the patient for the free drug program. Liane Comber stated that I should be receiving a fax Thursday or Friday with a decision.  This encounter will be updated until final determination.  [place] Patient [place] Phone [phone] Fax [phone] 03/08/2019   11:05 AM

## 2019-03-10 NOTE — Telephone Encounter (Signed)
Oral Oncology Patient Advocate Encounter  [name] Johnston has been approved to be filled through [place] patient assistance program 03/10/19-12/21/19.  [facility] will contact the patient to schedule the first shipment. Refills will need to be called in when the patient has 10 days of medicine left. [phone].  I called the patients daughter, Hjiem and gave her this information. She verbalized understanding and great appreciation.  [place] Patient [place] Phone [phone] Fax [phone] 03/10/2019   11:26 AM

## 2019-03-14 NOTE — Telephone Encounter (Signed)
Oral Oncology Patient Advocate Encounter  [name] Johnston called stating they are having difficulty reaching the patient, [name] Johnston needs to schedule shipment. I gave them his daughters number which is also on the application and let them know I would also try to reach them.  I called the patients daughter, [name] Johnston and left her a voicemail with [employer] phone number.  [place] Patient [place] Phone [phone] Fax [phone] 03/14/2019   12:29 PM

## 2019-04-14 ENCOUNTER — Inpatient Hospital Stay: Payer: PPO

## 2019-04-14 ENCOUNTER — Other Ambulatory Visit: Payer: Self-pay

## 2019-04-14 ENCOUNTER — Inpatient Hospital Stay: Payer: PPO | Attending: Oncology

## 2019-04-14 DIAGNOSIS — C787 Secondary malignant neoplasm of liver and intrahepatic bile duct: Secondary | ICD-10-CM | POA: Insufficient documentation

## 2019-04-14 DIAGNOSIS — Z9221 Personal history of antineoplastic chemotherapy: Secondary | ICD-10-CM | POA: Insufficient documentation

## 2019-04-14 DIAGNOSIS — C61 Malignant neoplasm of prostate: Secondary | ICD-10-CM | POA: Insufficient documentation

## 2019-04-14 DIAGNOSIS — Z95828 Presence of other vascular implants and grafts: Secondary | ICD-10-CM

## 2019-04-14 DIAGNOSIS — N135 Crossing vessel and stricture of ureter without hydronephrosis: Secondary | ICD-10-CM | POA: Insufficient documentation

## 2019-04-14 DIAGNOSIS — Z9079 Acquired absence of other genital organ(s): Secondary | ICD-10-CM | POA: Insufficient documentation

## 2019-04-14 LAB — CMP (CANCER CENTER ONLY)
ALT: 27 U/L (ref 0–44)
AST: 22 U/L (ref 15–41)
Albumin: 4.1 g/dL (ref 3.5–5.0)
Alkaline Phosphatase: 152 U/L — ABNORMAL HIGH (ref 38–126)
Anion gap: 12 (ref 5–15)
BUN: 12 mg/dL (ref 8–23)
CO2: 25 mmol/L (ref 22–32)
Calcium: 9.6 mg/dL (ref 8.9–10.3)
Chloride: 104 mmol/L (ref 98–111)
Creatinine: 0.9 mg/dL (ref 0.61–1.24)
GFR, Est AFR Am: >60 mL/min (ref >60)
GFR, Estimated: >60 mL/min (ref >60)
Glucose, Bld: 98 mg/dL (ref 70–99)
Potassium: 3.9 mmol/L (ref 3.5–5.1)
Sodium: 141 mmol/L (ref 135–145)
Total Bilirubin: 0.5 mg/dL (ref 0.3–1.2)
Total Protein: 7.8 g/dL (ref 6.5–8.1)

## 2019-04-14 LAB — CBC WITH DIFFERENTIAL (CANCER CENTER ONLY)
Abs Immature Granulocytes: 0.01 10*3/uL (ref 0.00–0.07)
Basophils Absolute: 0.1 10*3/uL (ref 0.0–0.1)
Basophils Relative: 2 %
Eosinophils Absolute: 0.2 10*3/uL (ref 0.0–0.5)
Eosinophils Relative: 5 %
HCT: 39.1 % (ref 39.0–52.0)
Hemoglobin: 12.6 g/dL — ABNORMAL LOW (ref 13.0–17.0)
Immature Granulocytes: 0 %
Lymphocytes Relative: 24 %
Lymphs Abs: 1.1 10*3/uL (ref 0.7–4.0)
MCH: 25.9 pg — ABNORMAL LOW (ref 26.0–34.0)
MCHC: 32.2 g/dL (ref 30.0–36.0)
MCV: 80.5 fL (ref 80.0–100.0)
Monocytes Absolute: 0.3 10*3/uL (ref 0.1–1.0)
Monocytes Relative: 6 %
Neutro Abs: 3 10*3/uL (ref 1.7–7.7)
Neutrophils Relative %: 63 %
Platelet Count: 196 10*3/uL (ref 150–400)
RBC: 4.86 MIL/uL (ref 4.22–5.81)
RDW: 13.6 % (ref 11.5–15.5)
WBC Count: 4.7 10*3/uL (ref 4.0–10.5)
nRBC: 0 % (ref 0.0–0.2)

## 2019-04-14 MED ORDER — SODIUM CHLORIDE 0.9% FLUSH
10.0000 mL | Freq: Once | INTRAVENOUS | Status: AC
  Administered 2019-04-14: 10 mL
  Filled 2019-04-14: qty 10

## 2019-04-14 MED ORDER — HEPARIN SOD (PORK) LOCK FLUSH 100 UNIT/ML IV SOLN
500.0000 [IU] | Freq: Once | INTRAVENOUS | Status: AC
  Administered 2019-04-14: 500 [IU]
  Filled 2019-04-14: qty 5

## 2019-04-15 LAB — PROSTATE-SPECIFIC AG, SERUM (LABCORP): Prostate Specific Ag, Serum: 26 ng/mL — ABNORMAL HIGH (ref 0.0–4.0)

## 2019-04-18 ENCOUNTER — Other Ambulatory Visit: Payer: Self-pay

## 2019-04-18 ENCOUNTER — Inpatient Hospital Stay (HOSPITAL_BASED_OUTPATIENT_CLINIC_OR_DEPARTMENT_OTHER): Payer: PPO | Admitting: Oncology

## 2019-04-18 VITALS — BP 116/74 | HR 73 | Temp 98.0°F | Resp 17 | Ht 62.0 in | Wt 128.9 lb

## 2019-04-18 DIAGNOSIS — C7800 Secondary malignant neoplasm of unspecified lung: Secondary | ICD-10-CM | POA: Diagnosis not present

## 2019-04-18 DIAGNOSIS — C61 Malignant neoplasm of prostate: Secondary | ICD-10-CM | POA: Diagnosis not present

## 2019-04-18 DIAGNOSIS — C787 Secondary malignant neoplasm of liver and intrahepatic bile duct: Secondary | ICD-10-CM

## 2019-04-18 NOTE — Telephone Encounter (Signed)
Oral Oncology Patient Advocate Encounter  I received a staff message today that the patient has still not received his Xtandi. I called and spoke with the patients daughter, she called Xtandi and scheduled shipment for tomorrow, 04/19/19.  I advised the daughter to call me if they do not receive it or have any other problems. She verbalized understanding and appreciation.  [place] Patient [place] Phone [phone] Fax [phone] 04/18/2019   11:11 AM

## 2019-04-18 NOTE — Progress Notes (Signed)
Hematology and Oncology Follow Up Visit  [name] Johnston [id number] 1951/10/20 68 y.o. 04/18/2019 10:05 AM [name] Johnston, MDDewey, [name], MD   Principle Diagnosis: 68 year old man with advanced prostate cancer with disease to the liver diagnosed in 2014.  He has castration-resistant disease with Gleason score of 8 and a PSA of 40 when he was diagnosed.  Prior Therapy:  He is started [place] on 06/27/2013. He subsequently received definitive radiation therapy for a planned dose of 75 gray completed in November 2014 under the care of Dr. Tammi Johnston.   He developed castration resistant disease with biopsy-proven liver metastasis in May 2016.  Docetaxel 75 mg/m given every 3 weeks with Neulasta support. He is status post 10 cycles of treatment concluded on 11/21/2015. He had an excellent PSA response with PSA dropping from 222 to 0.17.   PSA started to rise again with PSA up to 8.9 in August 2017.  He is status post orchiectomy completed in February 2019.  Zytiga 1000 mg with prednisone since September 2017.   Jevtana chemotherapy given at 20 mg/m every 3 weeks cycle 1 started on 07/29/2018.  He completed 6 cycles of therapy in November 2019.  Therapy discontinued because of patient's preference.  Current therapy: Xtandi 160 mg daily tentatively to start in the near future  Interim History:  [name] is here for a repeat evaluation.  Since the last visit, he was scheduled to receive Xtandi although has not received it yet and has not started his treatment at this time.  He reports no major changes in his health although he does report few complaints that have been chronic and unchanged.  He does report some lower extremity discomfort and some occasional dizziness.  His appetite and performance status remains adequate at this time.  He denies any worsening bone pain or abdominal discomfort.   He denied any alteration mental status, neuropathy, confusion or dizziness.  Denies any headaches or  lethargy.  Denies any night sweats, weight loss or changes in appetite.  Denied orthopnea, dyspnea on exertion or chest discomfort.  Denies shortness of breath, difficulty breathing hemoptysis or cough.  Denies any abdominal distention, nausea, early satiety or dyspepsia.  Denies any hematuria, frequency, dysuria or nocturia.  Denies any skin irritation, dryness or rash.  Denies any ecchymosis or petechiae.  Denies any lymphadenopathy or clotting.  Denies any heat or cold intolerance.  Denies any anxiety or depression.  Remaining review of system is negative.      Medications: I have reviewed the patient's current medications.   Current Outpatient Medications  Medication Sig Dispense Refill  . acetaminophen (TYLENOL) 500 MG tablet Take 500-1,000 mg by mouth every 6 (six) hours as needed.     . enzalutamide (XTANDI) 40 MG capsule Take 4 capsules (160 mg total) by mouth daily. 120 capsule 0  . oxyCODONE-acetaminophen (PERCOCET/ROXICET) 5-325 MG tablet Take 1 tablet by mouth every 6 (six) hours as needed for severe pain. 30 tablet 0   No current facility-administered medications for this visit.    Facility-Administered Medications Ordered in Other Visits  Medication Dose Route Frequency Provider Last Rate Last Dose  . heparin lock flush 100 unit/mL  500 Units Intracatheter Once [name] Portela, MD      . sodium chloride flush (NS) 0.9 % injection 10 mL  10 mL Intracatheter Once [name] Portela, MD      . sodium chloride flush (NS) 0.9 % injection 10 mL  10 mL Intracatheter PRN [name] Portela,  MD   10 mL at 09/30/18 1236     Allergies:  Allergies  Allergen Reactions  . Aspirin Hives    Past Medical History, Surgical history, Social history, and Family History were reviewed and updated.    Physical Exam:   Blood pressure 116/74, pulse 73, temperature 98 F (36.7 C), temperature source Oral, resp. rate 17, height 5\' 2"  (1.575 m), weight 128 lb 14.4 oz (58.5 kg), SpO2 100  %.      ECOG: 1     General appearance: Comfortable appearing without any discomfort Head: Normocephalic without any trauma Oropharynx: Mucous membranes are moist and pink without any thrush or ulcers. Eyes: Pupils are equal and round reactive to light. Lymph nodes: No cervical, supraclavicular, inguinal or axillary lymphadenopathy.   Heart:regular rate and rhythm.  S1 and S2 without leg edema. Lung: Clear without any rhonchi or wheezes.  No dullness to percussion. Abdomin: Soft, nontender, nondistended with good bowel sounds.  No hepatosplenomegaly. Musculoskeletal: No joint deformity or effusion.  Full range of motion noted. Neurological: No deficits noted on motor, sensory and deep tendon reflex exam. Skin: No petechial rash or dryness.  Appeared moist.               Lab Results: Lab Results  Component Value Date   WBC 4.7 04/14/2019   HGB 12.6 (L) 04/14/2019   HCT 39.1 04/14/2019   MCV 80.5 04/14/2019   PLT 196 04/14/2019     Chemistry      Component Value Date/Time   NA 141 04/14/2019 1050   NA 142 12/17/2017 1303   K 3.9 04/14/2019 1050   K 3.2 (L) 12/17/2017 1303   CL 104 04/14/2019 1050   CO2 25 04/14/2019 1050   CO2 28 12/17/2017 1303   BUN 12 04/14/2019 1050   BUN 14.0 12/17/2017 1303   CREATININE 0.90 04/14/2019 1050   CREATININE 1.1 12/17/2017 1303      Component Value Date/Time   CALCIUM 9.6 04/14/2019 1050   CALCIUM 9.0 12/17/2017 1303   ALKPHOS 152 (H) 04/14/2019 1050   ALKPHOS 85 12/17/2017 1303   AST 22 04/14/2019 1050   AST 12 12/17/2017 1303   ALT 27 04/14/2019 1050   ALT 15 12/17/2017 1303   BILITOT 0.5 04/14/2019 1050   BILITOT 0.41 12/17/2017 1303      Results for [name] Johnston (MRN [id number]) as of 04/18/2019 09:51  Ref. Range 03/07/2019 09:05 04/14/2019 10:50  Prostate Specific Ag, Serum Latest Ref Range: 0.0 - 4.0 ng/mL 14.9 (H) 26.0 (H)     Impression and Plan:  69 year old man with:  1.  Advanced prostate cancer  that is currently castration-resistant with disease to the liver.  He is currently on Xtandi which he has not started at this time.  His PSA however continues to rise currently at 26 up from 14.9.  Risks and benefits of starting this medication was discussed again.  His disease status was updated and given the rise in his PSA and is very possible that his disease is progressing and likely will become more symptomatic.  Alternative therapies were also reviewed including systemic chemotherapy.  At this time he wants to try oral medication for the time being.  We will arrange for him to receive this medication hopefully in the near future.   2.  Hepatic metastasis: No issues reported at this time.  3.  IV access: Port-A-Cath remains in use and will be flushed periodically.  4.  Left ureteral obstruction: Kidney function  remains normal and status post stent placement.    5. Prognosis: Therapy remains palliative and his disease is incurable.  He has progressed on multiple therapies and his prognosis is guarded at this time.  His performance status remains adequate and aggressive therapy is warranted at least for the time being.  He understands that if he continues to decline hospice may be needed in the future.  6.  Anemia: Resolved at this time hemoglobin is back to normal.  7. Follow-up: Will be 6 weeks to follow his progress.  25  minutes was spent with the patient face-to-face today.  More than 50% of time was dedicated to reviewing his disease status, reviewing laboratory data, treatment options and answering questions regarding future plan of care.    Zola Button, MD 4/28/202010:05 AM

## 2019-04-19 ENCOUNTER — Other Ambulatory Visit: Payer: Self-pay

## 2019-04-19 ENCOUNTER — Telehealth: Payer: Self-pay | Admitting: Oncology

## 2019-04-19 DIAGNOSIS — C61 Malignant neoplasm of prostate: Secondary | ICD-10-CM

## 2019-04-19 MED ORDER — ENZALUTAMIDE 40 MG PO CAPS: 160.0000 mg | ORAL_CAPSULE | Freq: Every day | ORAL | 0 refills | Status: DC

## 2019-04-19 NOTE — Telephone Encounter (Signed)
Scheduled appt per 4/28 sch message. ° °A calendar will be mailed out. °

## 2019-04-20 ENCOUNTER — Other Ambulatory Visit: Payer: Self-pay

## 2019-04-20 DIAGNOSIS — C61 Malignant neoplasm of prostate: Secondary | ICD-10-CM

## 2019-04-20 MED ORDER — ENZALUTAMIDE 40 MG PO CAPS: 160.0000 mg | ORAL_CAPSULE | Freq: Every day | ORAL | 0 refills | Status: DC

## 2019-05-04 ENCOUNTER — Other Ambulatory Visit: Payer: Self-pay

## 2019-05-04 DIAGNOSIS — C61 Malignant neoplasm of prostate: Secondary | ICD-10-CM

## 2019-05-04 MED ORDER — ENZALUTAMIDE 40 MG PO CAPS: 160.0000 mg | ORAL_CAPSULE | Freq: Every day | ORAL | 0 refills | Status: DC

## 2019-05-06 ENCOUNTER — Other Ambulatory Visit: Payer: Self-pay

## 2019-05-06 ENCOUNTER — Emergency Department (HOSPITAL_COMMUNITY): Payer: PPO

## 2019-05-06 ENCOUNTER — Inpatient Hospital Stay (HOSPITAL_COMMUNITY)
Admission: EM | Admit: 2019-05-06 | Discharge: 2019-05-09 | DRG: 659 | Disposition: A | Discharge status: Home / Self Care | Payer: PPO | Attending: Urology | Admitting: Urology

## 2019-05-06 ENCOUNTER — Observation Stay (HOSPITAL_COMMUNITY): Payer: PPO

## 2019-05-06 ENCOUNTER — Encounter (HOSPITAL_COMMUNITY): Payer: Self-pay | Admitting: Emergency Medicine

## 2019-05-06 DIAGNOSIS — R7881 Bacteremia: Secondary | ICD-10-CM | POA: Diagnosis not present

## 2019-05-06 DIAGNOSIS — A419 Sepsis, unspecified organism: Secondary | ICD-10-CM

## 2019-05-06 DIAGNOSIS — N179 Acute kidney failure, unspecified: Secondary | ICD-10-CM | POA: Diagnosis present

## 2019-05-06 DIAGNOSIS — A4151 Sepsis due to Escherichia coli [E. coli]: Secondary | ICD-10-CM | POA: Diagnosis present

## 2019-05-06 DIAGNOSIS — Z923 Personal history of irradiation: Secondary | ICD-10-CM | POA: Diagnosis not present

## 2019-05-06 DIAGNOSIS — N281 Cyst of kidney, acquired: Secondary | ICD-10-CM | POA: Diagnosis not present

## 2019-05-06 DIAGNOSIS — Y846 Urinary catheterization as the cause of abnormal reaction of the patient, or of later complication, without mention of misadventure at the time of the procedure: Secondary | ICD-10-CM | POA: Diagnosis present

## 2019-05-06 DIAGNOSIS — Z886 Allergy status to analgesic agent status: Secondary | ICD-10-CM

## 2019-05-06 DIAGNOSIS — Z20828 Contact with and (suspected) exposure to other viral communicable diseases: Secondary | ICD-10-CM | POA: Diagnosis present

## 2019-05-06 DIAGNOSIS — Z960 Presence of urogenital implants: Secondary | ICD-10-CM | POA: Diagnosis present

## 2019-05-06 DIAGNOSIS — N132 Hydronephrosis with renal and ureteral calculous obstruction: Secondary | ICD-10-CM | POA: Diagnosis not present

## 2019-05-06 DIAGNOSIS — I10 Essential (primary) hypertension: Secondary | ICD-10-CM | POA: Diagnosis not present

## 2019-05-06 DIAGNOSIS — N390 Urinary tract infection, site not specified: Secondary | ICD-10-CM | POA: Diagnosis present

## 2019-05-06 DIAGNOSIS — R7309 Other abnormal glucose: Secondary | ICD-10-CM | POA: Diagnosis not present

## 2019-05-06 DIAGNOSIS — R319 Hematuria, unspecified: Secondary | ICD-10-CM | POA: Diagnosis not present

## 2019-05-06 DIAGNOSIS — Z1612 Extended spectrum beta lactamase (ESBL) resistance: Secondary | ICD-10-CM | POA: Diagnosis present

## 2019-05-06 DIAGNOSIS — T83592A Infection and inflammatory reaction due to indwelling ureteral stent, initial encounter: Secondary | ICD-10-CM | POA: Diagnosis present

## 2019-05-06 DIAGNOSIS — C787 Secondary malignant neoplasm of liver and intrahepatic bile duct: Secondary | ICD-10-CM | POA: Diagnosis present

## 2019-05-06 DIAGNOSIS — Y732 Prosthetic and other implants, materials and accessory gastroenterology and urology devices associated with adverse incidents: Secondary | ICD-10-CM | POA: Diagnosis present

## 2019-05-06 DIAGNOSIS — T83192A Other mechanical complication of urinary stent, initial encounter: Secondary | ICD-10-CM | POA: Diagnosis present

## 2019-05-06 DIAGNOSIS — N136 Pyonephrosis: Secondary | ICD-10-CM | POA: Diagnosis present

## 2019-05-06 DIAGNOSIS — R0602 Shortness of breath: Secondary | ICD-10-CM | POA: Diagnosis present

## 2019-05-06 DIAGNOSIS — E876 Hypokalemia: Secondary | ICD-10-CM | POA: Diagnosis present

## 2019-05-06 DIAGNOSIS — Z9221 Personal history of antineoplastic chemotherapy: Secondary | ICD-10-CM

## 2019-05-06 DIAGNOSIS — C61 Malignant neoplasm of prostate: Secondary | ICD-10-CM | POA: Diagnosis not present

## 2019-05-06 DIAGNOSIS — Z9079 Acquired absence of other genital organ(s): Secondary | ICD-10-CM | POA: Diagnosis not present

## 2019-05-06 DIAGNOSIS — N135 Crossing vessel and stricture of ureter without hydronephrosis: Secondary | ICD-10-CM

## 2019-05-06 DIAGNOSIS — Z8546 Personal history of malignant neoplasm of prostate: Secondary | ICD-10-CM | POA: Diagnosis not present

## 2019-05-06 DIAGNOSIS — N1330 Unspecified hydronephrosis: Secondary | ICD-10-CM | POA: Diagnosis not present

## 2019-05-06 DIAGNOSIS — E46 Unspecified protein-calorie malnutrition: Secondary | ICD-10-CM | POA: Diagnosis not present

## 2019-05-06 DIAGNOSIS — A4150 Gram-negative sepsis, unspecified: Secondary | ICD-10-CM | POA: Diagnosis not present

## 2019-05-06 LAB — CBC WITH DIFFERENTIAL/PLATELET
Abs Immature Granulocytes: 0.04 10*3/uL (ref 0.00–0.07)
Basophils Absolute: 0 10*3/uL (ref 0.0–0.1)
Basophils Relative: 0 %
Eosinophils Absolute: 0 10*3/uL (ref 0.0–0.5)
Eosinophils Relative: 0 %
HCT: 30.2 % — ABNORMAL LOW (ref 39.0–52.0)
Hemoglobin: 10.1 g/dL — ABNORMAL LOW (ref 13.0–17.0)
Immature Granulocytes: 1 %
Lymphocytes Relative: 4 %
Lymphs Abs: 0.4 10*3/uL — ABNORMAL LOW (ref 0.7–4.0)
MCH: 26.8 pg (ref 26.0–34.0)
MCHC: 33.4 g/dL (ref 30.0–36.0)
MCV: 80.1 fL (ref 80.0–100.0)
Monocytes Absolute: 0.7 10*3/uL (ref 0.1–1.0)
Monocytes Relative: 8 %
Neutro Abs: 6.9 10*3/uL (ref 1.7–7.7)
Neutrophils Relative %: 87 %
Platelets: 176 10*3/uL (ref 150–400)
RBC: 3.77 MIL/uL — ABNORMAL LOW (ref 4.22–5.81)
RDW: 13.6 % (ref 11.5–15.5)
WBC: 8 10*3/uL (ref 4.0–10.5)
nRBC: 0 % (ref 0.0–0.2)

## 2019-05-06 LAB — URINALYSIS, ROUTINE W REFLEX MICROSCOPIC
Bilirubin Urine: NEGATIVE
Glucose, UA: NEGATIVE mg/dL
Ketones, ur: NEGATIVE mg/dL
Nitrite: NEGATIVE
Protein, ur: 100 mg/dL — AB
RBC / HPF: >50 RBC/hpf — ABNORMAL HIGH (ref 0–5)
Specific Gravity, Urine: 1.021 (ref 1.005–1.030)
WBC, UA: >50 WBC/hpf — ABNORMAL HIGH (ref 0–5)
pH: 5 (ref 5.0–8.0)

## 2019-05-06 LAB — COMPREHENSIVE METABOLIC PANEL
ALT: 22 U/L (ref 0–44)
AST: 19 U/L (ref 15–41)
Albumin: 2.9 g/dL — ABNORMAL LOW (ref 3.5–5.0)
Alkaline Phosphatase: 112 U/L (ref 38–126)
Anion gap: 10 (ref 5–15)
BUN: 15 mg/dL (ref 8–23)
CO2: 21 mmol/L — ABNORMAL LOW (ref 22–32)
Calcium: 8.2 mg/dL — ABNORMAL LOW (ref 8.9–10.3)
Chloride: 102 mmol/L (ref 98–111)
Creatinine, Ser: 1.34 mg/dL — ABNORMAL HIGH (ref 0.61–1.24)
GFR calc Af Amer: >60 mL/min (ref >60)
GFR calc non Af Amer: 54 mL/min — ABNORMAL LOW (ref >60)
Glucose, Bld: 129 mg/dL — ABNORMAL HIGH (ref 70–99)
Potassium: 3.1 mmol/L — ABNORMAL LOW (ref 3.5–5.1)
Sodium: 133 mmol/L — ABNORMAL LOW (ref 135–145)
Total Bilirubin: 1.6 mg/dL — ABNORMAL HIGH (ref 0.3–1.2)
Total Protein: 6.7 g/dL (ref 6.5–8.1)

## 2019-05-06 LAB — SARS CORONAVIRUS 2 BY RT PCR (HOSPITAL ORDER, PERFORMED IN ~~LOC~~ HOSPITAL LAB): SARS Coronavirus 2: NEGATIVE

## 2019-05-06 LAB — LACTIC ACID, PLASMA: Lactic Acid, Venous: 1.2 mmol/L (ref 0.5–1.9)

## 2019-05-06 MED ORDER — LACTATED RINGERS IV SOLN
INTRAVENOUS | Status: AC
  Administered 2019-05-06: 22:00:00 via INTRAVENOUS

## 2019-05-06 MED ORDER — POTASSIUM CHLORIDE 20 MEQ/15ML (10%) PO SOLN
60.0000 meq | Freq: Once | ORAL | Status: AC
  Administered 2019-05-06: 60 meq via ORAL
  Filled 2019-05-06: qty 45

## 2019-05-06 MED ORDER — ONDANSETRON HCL 4 MG PO TABS: 4.0000 mg | ORAL_TABLET | Freq: Four times a day (QID) | ORAL | Status: DC | PRN

## 2019-05-06 MED ORDER — OXYCODONE-ACETAMINOPHEN 5-325 MG PO TABS
1.0000 | ORAL_TABLET | Freq: Four times a day (QID) | ORAL | Status: DC | PRN
  Administered 2019-05-08: 1 via ORAL
  Filled 2019-05-06: qty 1

## 2019-05-06 MED ORDER — SODIUM CHLORIDE 0.9 % IV SOLN
2.0000 g | Freq: Once | INTRAVENOUS | Status: AC
  Administered 2019-05-06: 18:00:00 2 g via INTRAVENOUS
  Filled 2019-05-06: qty 2

## 2019-05-06 MED ORDER — SENNOSIDES-DOCUSATE SODIUM 8.6-50 MG PO TABS: 1.0000 | ORAL_TABLET | Freq: Every evening | ORAL | Status: DC | PRN

## 2019-05-06 MED ORDER — SODIUM CHLORIDE 0.9 % IV SOLN
1.0000 g | INTRAVENOUS | Status: DC
  Administered 2019-05-07: 1 g via INTRAVENOUS
  Filled 2019-05-06 (×2): qty 10

## 2019-05-06 MED ORDER — ENZALUTAMIDE 40 MG PO CAPS
160.0000 mg | ORAL_CAPSULE | Freq: Every day | ORAL | Status: DC
  Administered 2019-05-07 – 2019-05-09 (×3): 160 mg via ORAL
  Filled 2019-05-06 (×5): qty 4

## 2019-05-06 MED ORDER — HEPARIN SODIUM (PORCINE) 5000 UNIT/ML IJ SOLN
5000.0000 [IU] | Freq: Three times a day (TID) | INTRAMUSCULAR | Status: DC
  Administered 2019-05-06 – 2019-05-09 (×7): 5000 [IU] via SUBCUTANEOUS
  Filled 2019-05-06 (×7): qty 1

## 2019-05-06 MED ORDER — ACETAMINOPHEN 325 MG PO TABS
650.0000 mg | ORAL_TABLET | Freq: Four times a day (QID) | ORAL | Status: DC | PRN
  Administered 2019-05-07 (×2): 650 mg via ORAL
  Filled 2019-05-06 (×3): qty 2

## 2019-05-06 MED ORDER — ACETAMINOPHEN 650 MG RE SUPP: 650.0000 mg | Freq: Four times a day (QID) | RECTAL | Status: DC | PRN

## 2019-05-06 MED ORDER — ONDANSETRON HCL 4 MG/2ML IJ SOLN: 4.0000 mg | Freq: Four times a day (QID) | INTRAMUSCULAR | Status: DC | PRN

## 2019-05-06 MED ORDER — ACETAMINOPHEN 325 MG PO TABS
650.0000 mg | ORAL_TABLET | Freq: Once | ORAL | Status: AC
  Administered 2019-05-06: 650 mg via ORAL
  Filled 2019-05-06: qty 2

## 2019-05-06 MED ORDER — LACTATED RINGERS IV BOLUS
1000.0000 mL | Freq: Once | INTRAVENOUS | Status: AC
  Administered 2019-05-06: 1000 mL via INTRAVENOUS

## 2019-05-06 NOTE — Progress Notes (Signed)
Received report from [facility] in the ED.

## 2019-05-06 NOTE — ED Notes (Addendum)
Portable Xray at bedside.
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
## 2019-05-06 NOTE — Progress Notes (Signed)
Attempted to call report and [employer] phones are down. ND to give message for RN to call [place] back

## 2019-05-06 NOTE — ED Notes (Signed)
Patient transported to Ultrasound 

## 2019-05-06 NOTE — Progress Notes (Addendum)
On arrival to patient's room to give him his 10pm meds, patient's wife had left the hospital and left tylenol and Xtandi at the patient's bedside. RN had the patient call his wife and this RN spoke with the wife and she stated that the patient took 2 of his personal tylenol at [place]. RN instructed patient and wife that he could not take his own medications at the bedside and that we would provide them with his medications. Patient's medications were then counted and taken to pharmacy. Will continue to monitor and treat per MD orders.

## 2019-05-06 NOTE — ED Triage Notes (Signed)
Wife stated, He is sick with chills. He has lung and prostate cancer and is still taking treatment. This started a week ago.
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
## 2019-05-07 ENCOUNTER — Inpatient Hospital Stay (HOSPITAL_COMMUNITY): Payer: PPO

## 2019-05-07 ENCOUNTER — Encounter (HOSPITAL_COMMUNITY): Payer: Self-pay | Admitting: Radiology

## 2019-05-07 DIAGNOSIS — N179 Acute kidney failure, unspecified: Secondary | ICD-10-CM

## 2019-05-07 DIAGNOSIS — Z20828 Contact with and (suspected) exposure to other viral communicable diseases: Secondary | ICD-10-CM | POA: Diagnosis present

## 2019-05-07 DIAGNOSIS — Z9221 Personal history of antineoplastic chemotherapy: Secondary | ICD-10-CM | POA: Diagnosis not present

## 2019-05-07 DIAGNOSIS — N390 Urinary tract infection, site not specified: Secondary | ICD-10-CM | POA: Diagnosis present

## 2019-05-07 DIAGNOSIS — Z923 Personal history of irradiation: Secondary | ICD-10-CM | POA: Diagnosis not present

## 2019-05-07 DIAGNOSIS — E876 Hypokalemia: Secondary | ICD-10-CM | POA: Diagnosis present

## 2019-05-07 DIAGNOSIS — A419 Sepsis, unspecified organism: Secondary | ICD-10-CM | POA: Diagnosis not present

## 2019-05-07 DIAGNOSIS — C61 Malignant neoplasm of prostate: Secondary | ICD-10-CM

## 2019-05-07 DIAGNOSIS — Z886 Allergy status to analgesic agent status: Secondary | ICD-10-CM | POA: Diagnosis not present

## 2019-05-07 DIAGNOSIS — C787 Secondary malignant neoplasm of liver and intrahepatic bile duct: Secondary | ICD-10-CM | POA: Diagnosis present

## 2019-05-07 DIAGNOSIS — T83592A Infection and inflammatory reaction due to indwelling ureteral stent, initial encounter: Secondary | ICD-10-CM | POA: Diagnosis present

## 2019-05-07 DIAGNOSIS — N1330 Unspecified hydronephrosis: Secondary | ICD-10-CM | POA: Diagnosis not present

## 2019-05-07 DIAGNOSIS — R7309 Other abnormal glucose: Secondary | ICD-10-CM | POA: Diagnosis not present

## 2019-05-07 DIAGNOSIS — N136 Pyonephrosis: Secondary | ICD-10-CM | POA: Diagnosis present

## 2019-05-07 DIAGNOSIS — R319 Hematuria, unspecified: Secondary | ICD-10-CM

## 2019-05-07 DIAGNOSIS — R7881 Bacteremia: Secondary | ICD-10-CM

## 2019-05-07 DIAGNOSIS — Z960 Presence of urogenital implants: Secondary | ICD-10-CM | POA: Diagnosis present

## 2019-05-07 DIAGNOSIS — Z8546 Personal history of malignant neoplasm of prostate: Secondary | ICD-10-CM | POA: Diagnosis not present

## 2019-05-07 DIAGNOSIS — Y846 Urinary catheterization as the cause of abnormal reaction of the patient, or of later complication, without mention of misadventure at the time of the procedure: Secondary | ICD-10-CM | POA: Diagnosis present

## 2019-05-07 DIAGNOSIS — Z1612 Extended spectrum beta lactamase (ESBL) resistance: Secondary | ICD-10-CM | POA: Diagnosis present

## 2019-05-07 DIAGNOSIS — A4150 Gram-negative sepsis, unspecified: Secondary | ICD-10-CM

## 2019-05-07 DIAGNOSIS — T83192A Other mechanical complication of urinary stent, initial encounter: Secondary | ICD-10-CM | POA: Diagnosis present

## 2019-05-07 DIAGNOSIS — A4151 Sepsis due to Escherichia coli [E. coli]: Secondary | ICD-10-CM | POA: Diagnosis present

## 2019-05-07 DIAGNOSIS — R0602 Shortness of breath: Secondary | ICD-10-CM | POA: Diagnosis present

## 2019-05-07 DIAGNOSIS — Z9079 Acquired absence of other genital organ(s): Secondary | ICD-10-CM | POA: Diagnosis not present

## 2019-05-07 DIAGNOSIS — Y732 Prosthetic and other implants, materials and accessory gastroenterology and urology devices associated with adverse incidents: Secondary | ICD-10-CM | POA: Diagnosis present

## 2019-05-07 LAB — BLOOD CULTURE ID PANEL (REFLEXED)

## 2019-05-07 LAB — CBC
HCT: 27.3 % — ABNORMAL LOW (ref 39.0–52.0)
Hemoglobin: 9 g/dL — ABNORMAL LOW (ref 13.0–17.0)
MCH: 26.3 pg (ref 26.0–34.0)
MCHC: 33 g/dL (ref 30.0–36.0)
MCV: 79.8 fL — ABNORMAL LOW (ref 80.0–100.0)
Platelets: 174 10*3/uL (ref 150–400)
RBC: 3.42 MIL/uL — ABNORMAL LOW (ref 4.22–5.81)
RDW: 13.7 % (ref 11.5–15.5)
WBC: 5.6 10*3/uL (ref 4.0–10.5)
nRBC: 0 % (ref 0.0–0.2)

## 2019-05-07 LAB — BASIC METABOLIC PANEL
Anion gap: 9 (ref 5–15)
BUN: 14 mg/dL (ref 8–23)
CO2: 24 mmol/L (ref 22–32)
Calcium: 8.5 mg/dL — ABNORMAL LOW (ref 8.9–10.3)
Chloride: 105 mmol/L (ref 98–111)
Creatinine, Ser: 1.29 mg/dL — ABNORMAL HIGH (ref 0.61–1.24)
GFR calc Af Amer: >60 mL/min (ref >60)
GFR calc non Af Amer: 56 mL/min — ABNORMAL LOW (ref >60)
Glucose, Bld: 128 mg/dL — ABNORMAL HIGH (ref 70–99)
Potassium: 4.2 mmol/L (ref 3.5–5.1)
Sodium: 138 mmol/L (ref 135–145)

## 2019-05-07 LAB — HIV ANTIBODY (ROUTINE TESTING W REFLEX): HIV Screen 4th Generation wRfx: NONREACTIVE

## 2019-05-07 MED ORDER — IOHEXOL 300 MG/ML  SOLN
100.0000 mL | Freq: Once | INTRAMUSCULAR | Status: AC | PRN
  Administered 2019-05-07: 100 mL via INTRAVENOUS

## 2019-05-07 MED ORDER — SODIUM CHLORIDE 0.9 % IV SOLN
2.0000 g | INTRAVENOUS | Status: DC
  Administered 2019-05-08 – 2019-05-09 (×2): 2 g via INTRAVENOUS
  Filled 2019-05-07 (×2): qty 20
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
## 2019-05-07 NOTE — Progress Notes (Signed)
Patient transferred from ED to [id number]. Patient A&Ox4. Skin assessment completed by this RN and Mindy Hopper,RN. Small ecchymosis noted on patient's left buttock. Patient instructed how to use the callbell before getting out of bed. Callbell within reach. Admission handbook given to patient. Will continue to monitor and treat per MD orders.

## 2019-05-07 NOTE — Progress Notes (Signed)
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  Patient is [employer] speaking and his wife assists with interpretation  Found to have sepsis, bacteremia, hydronephrosis Urology consulted     HPI/Recap of past 24 hours:  Fever coming down,  Patient speaks some english, he denies pain He appears in no acute distress  Assessment/Plan: Principal Problem:   Sepsis ([place]) Active Problems:   Prostate CA ([place])   AKI (acute kidney injury) ([place])  Sepsis from UTI, g- bacteremia Rocephin 2g daily  Left sided hydronephrosis, h/o metastatic  prostate cancer s/p stent to left ureter in 01/2019 -urology consulted, will follow recommendation  AKI Cr 0.9 at baseline Cr 1.34 on presentation Treat uti and hydronephrosis Renal dosing meds  Hypokalemia Replaced  Elevated am blood glucose, check a1c No mention of prior diabetes diagnosis , he is not on meds for it at home But he asks for [employer] brand insulin "relion"  Code Status: full  Family Communication: patient   Disposition Plan: not ready to discharge   Consultants:  Urology Dr Gloriann Loan  Procedures:  none  Antibiotics:  Rocephin    Objective: BP 124/74 (BP Location: Left Arm)   Pulse 95   Temp 98.8 F (37.1 C) (Oral)   Resp 18   Ht 5\' 2"  (1.575 m)   Wt 59.3 kg   SpO2 97%   BMI 23.91 kg/m   Intake/Output Summary (Last 24 hours) at 05/07/2019 0944 Last data filed at 05/07/2019 0700 Gross per 24 hour  Intake 1670.17 ml  Output -  Net 1670.17 ml   Filed Weights   05/06/19 1529 05/07/19 0702  Weight: 58.5 kg 59.3 kg    Exam: Patient is examined daily including today on 05/07/2019, exams remain the same as of yesterday except that has changed    General:  thin, NAD  Cardiovascular: RRR  Respiratory: CTABL  Abdomen: Soft/ND/NT, positive BS  Musculoskeletal: No Edema  Neuro: alert, oriented   Data Reviewed: Basic Metabolic Panel: Recent Labs  Lab 05/06/19 1607 05/07/19 0323  NA 133* 138  K 3.1* 4.2  CL 102 105  CO2 21* 24  GLUCOSE 129* 128*  BUN 15 14  CREATININE 1.34* 1.29*  CALCIUM 8.2* 8.5*   Liver Function Tests: Recent Labs  Lab 05/06/19 1607  AST 19  ALT 22  ALKPHOS 112  BILITOT 1.6*  PROT 6.7  ALBUMIN 2.9*   No results for input(s): LIPASE, AMYLASE in the last 168 hours. No results for input(s): AMMONIA in the last 168 hours. CBC: Recent Labs  Lab 05/06/19 1607 05/07/19 0323  WBC 8.0 5.6  NEUTROABS 6.9  --   HGB 10.1* 9.0*  HCT 30.2* 27.3*  MCV 80.1 79.8*  PLT 176 174   Cardiac Enzymes:   No results for input(s): CKTOTAL, CKMB, CKMBINDEX, TROPONINI in the last 168 hours. BNP (last 3 results) No results for input(s): BNP in the last 8760 hours.  ProBNP (last 3 results) No results for input(s): PROBNP in the last 8760 hours.  CBG: No results for input(s): GLUCAP in the last 168 hours.  Recent Results (from the past 240 hour(s))  SARS Coronavirus 2 (CEPHEID- Performed in [place] [facility] hospital lab), Hosp Order     Status: None   Collection Time: 05/06/19  4:07 PM  Result Value Ref Range Status   SARS Coronavirus 2 NEGATIVE NEGATIVE Final    Comment: (NOTE) If result is NEGATIVE SARS-CoV-2 target nucleic acids are NOT DETECTED. The SARS-CoV-2 RNA is generally detectable in upper and lower  respiratory specimens during the acute phase of infection. The lowest  concentration of SARS-CoV-2 viral copies this assay can detect is 250  copies / mL. A negative result does not preclude SARS-CoV-2 infection  and should not be used as the sole basis for treatment or other  patient management decisions.  A negative result may occur with  improper specimen collection / handling, submission of specimen  other  than nasopharyngeal swab, presence of viral mutation(s) within the  areas targeted by this assay, and inadequate number of viral copies  (<250 copies / mL). A negative result must be combined with clinical  observations, patient history, and epidemiological information. If result is POSITIVE SARS-CoV-2 target nucleic acids are DETECTED. The SARS-CoV-2 RNA is generally detectable in upper and lower  respiratory specimens dur ing the acute phase of infection.  Positive  results are indicative of active infection with SARS-CoV-2.  Clinical  correlation with patient history and other diagnostic information is  necessary to determine patient infection status.  Positive results do  not rule out bacterial infection or co-infection with other viruses. If result is PRESUMPTIVE POSTIVE SARS-CoV-2 nucleic acids MAY BE PRESENT.   A presumptive positive result was obtained on the submitted specimen  and confirmed on repeat testing.  While 2019 novel coronavirus  (SARS-CoV-2) nucleic acids may be present in the submitted sample  additional confirmatory testing may be necessary for epidemiological  and / or clinical management purposes  to differentiate between  SARS-CoV-2 and other Sarbecovirus currently known to infect humans.  If clinically indicated additional testing with an alternate test  methodology [phone]) is advised. The SARS-CoV-2 RNA is generally  detectable in upper and lower respiratory sp ecimens during the acute  phase of infection. The expected result is Negative. Fact Sheet for Patients:  StrictlyIdeas.no Fact Sheet for Healthcare Providers: BankingDealers.co.za This test is not yet approved or cleared by the [place] FDA and has been authorized for detection and/or diagnosis of SARS-CoV-2 by FDA under an Emergency Use Authorization (EUA).  This EUA will remain in effect (meaning this test can be used) for the duration  of the COVID-19 declaration under Section 564(b)(1) of the Act, 21 U.S.C. section 360bbb-3(b)(1), unless the authorization is terminated or revoked sooner. Performed at [facility], [place] [address] [place], [place] [address]   Blood culture (routine x 2)     Status: None (Preliminary result)   Collection Time: 05/06/19  4:50 PM  Result Value Ref Range Status   Specimen Description BLOOD LEFT ANTECUBITAL  Final   Special Requests   Final    BOTTLES DRAWN AEROBIC AND ANAEROBIC Blood Culture adequate volume   Culture  Setup Time   Final    ANAEROBIC BOTTLE ONLY GRAM NEGATIVE RODS CRITICAL RESULT CALLED TO, READ BACK BY AND VERIFIED WITH: PHARMD [place] [id number] [place] Performed at [facility], [place] [address], [place], [place] [address]    Culture GRAM NEGATIVE RODS  Final   Report Status PENDING  Incomplete  Blood Culture ID Panel (Reflexed)     Status: Abnormal   Collection Time: 05/06/19  4:50 PM  Result Value Ref Range Status   Enterococcus species NOT DETECTED NOT DETECTED Final   Listeria monocytogenes NOT DETECTED NOT  DETECTED Final   Staphylococcus species NOT DETECTED NOT DETECTED Final   Staphylococcus aureus (BCID) NOT DETECTED NOT DETECTED Final   Streptococcus species NOT DETECTED NOT DETECTED Final   Streptococcus agalactiae NOT DETECTED NOT DETECTED Final   Streptococcus pneumoniae NOT DETECTED NOT DETECTED Final   Streptococcus pyogenes NOT DETECTED NOT DETECTED Final   Acinetobacter baumannii NOT DETECTED NOT DETECTED Final   Enterobacteriaceae species DETECTED (A) NOT DETECTED Final    Comment: Enterobacteriaceae represent a large family of gram-negative bacteria, not a single organism. CRITICAL RESULT CALLED TO, READ BACK BY AND VERIFIED WITH: PHARMD [place] S [phone] FCP    Enterobacter cloacae complex NOT DETECTED NOT DETECTED Final   Escherichia coli DETECTED (A) NOT DETECTED Final    Comment: CRITICAL RESULT CALLED TO, READ BACK BY AND  VERIFIED WITH: PHARMD REBECCA S [phone] FCP    Klebsiella oxytoca NOT DETECTED NOT DETECTED Final   Klebsiella pneumoniae NOT DETECTED NOT DETECTED Final   Proteus species NOT DETECTED NOT DETECTED Final   Serratia marcescens NOT DETECTED NOT DETECTED Final   Carbapenem resistance NOT DETECTED NOT DETECTED Final   Haemophilus influenzae NOT DETECTED NOT DETECTED Final   Neisseria meningitidis NOT DETECTED NOT DETECTED Final   Pseudomonas aeruginosa NOT DETECTED NOT DETECTED Final   Candida albicans NOT DETECTED NOT DETECTED Final   Candida glabrata NOT DETECTED NOT DETECTED Final   Candida krusei NOT DETECTED NOT DETECTED Final   Candida parapsilosis NOT DETECTED NOT DETECTED Final   Candida tropicalis NOT DETECTED NOT DETECTED Final    Comment: Performed at [facility], [place] [address] [place], [place] [address]     Studies: US Renal  Result Date: 05/06/2019 CLINICAL DATA:  Question ureteral obstruction. History of prostate cancer with prostate resection. EXAM: RENAL / URINARY TRACT ULTRASOUND COMPLETE COMPARISON:  Nuclear medicine renogram 01/30/2019 FINDINGS: Right Kidney: Renal measurements: 11 x 4.6 x 6.2 cm = volume: 164.1 mL . Echogenicity within normal limits. No mass or hydronephrosis visualized. Left Kidney: Renal measurements: 12 x 6.5 x 6.7 cm = volume: 270.5 mL. Left ureteral stent in place. Moderate hydronephrosis. Simple appearing cyst noted within the inferior pole of the left kidney measuring 1.5 cm. Bladder: Appears normal for degree of bladder distention. IMPRESSION: 1. Moderate left hydronephrosis status post left ureteral stenting on 02/06/2019 Electronically Signed   By: Kerby Moors M.D.   On: 05/06/2019 19:42   Dg Chest Port 1 View  Result Date: 05/06/2019 CLINICAL DATA:  Shortness of breath. EXAM: PORTABLE CHEST 1 VIEW COMPARISON:  February 03, 2018 FINDINGS: Stable right Port-A-Cath, in good position. The heart, hila, mediastinum, lungs, and pleura are  otherwise unremarkable. IMPRESSION: No active disease. Electronically Signed   By: Dorise Bullion III M.D   On: 05/06/2019 17:05    Scheduled Meds: . enzalutamide  160 mg Oral Daily  . heparin  5,000 Units Subcutaneous Q8H    Continuous Infusions: . [START ON 05/08/2019] cefTRIAXone (ROCEPHIN)  IV       Time spent: 24mins, case discussed with urology Dr Gloriann Loan I have personally reviewed and interpreted on  05/07/2019 daily labs,  imagings as discussed above under date review session and assessment and plans.  I reviewed all nursing notes, pharmacy notes, consultant notes,  vitals, pertinent old records  I have discussed plan of care as described above with RN , patient  on 05/07/2019   Florencia Reasons MD, PhD  [facility] Pager [phone]. If 7PM-7AM, please contact night-coverage at  [email or web address], password [facility] 05/07/2019, 9:44 AM  LOS: 0 days

## 2019-05-07 NOTE — Progress Notes (Signed)
Due to patient's primary language being [place] and this language not being available on the [employer], this RN and Genelle Gather., RN counted and verified the medications the patient brought from home, which were OTC tylenol and Xtandi. Spoke with Charleston Ropes in the pharmacy who said this process was okay to do since the patient's language cannot be translated. Medications taken to the pharmacy at this time. Security bag number with medications inside is [id number].

## 2019-05-07 NOTE — Progress Notes (Signed)
PHARMACY - PHYSICIAN COMMUNICATION CRITICAL VALUE ALERT - BLOOD CULTURE IDENTIFICATION (BCID)  [name] Johnston is an 68 y.o. male who presented to [facility] on 05/06/2019 with a chief complaint of fever and chills. Patient with Tmax of 102.5, patient still febrile this AM, WBC 5.6.    Assessment:  Blood culture from 5/16 growing GNR in 1 aerobic bottle, BCID E. Coli (no KPC gene detected)   Name of physician (or Provider) Contacted: Florencia Reasons, MD  Current antibiotics: Ceftriaxone 1g IV q24h  Changes to prescribed antibiotics recommended:  Increase Ceftriaxone to 2g IV q24h  Results for orders placed or performed during the hospital encounter of 05/06/19  Blood Culture ID Panel (Reflexed) (Collected: 05/06/2019  4:50 PM)  Result Value Ref Range   Enterococcus species NOT DETECTED NOT DETECTED   Listeria monocytogenes NOT DETECTED NOT DETECTED   Staphylococcus species NOT DETECTED NOT DETECTED   Staphylococcus aureus (BCID) NOT DETECTED NOT DETECTED   Streptococcus species NOT DETECTED NOT DETECTED   Streptococcus agalactiae NOT DETECTED NOT DETECTED   Streptococcus pneumoniae NOT DETECTED NOT DETECTED   Streptococcus pyogenes NOT DETECTED NOT DETECTED   Acinetobacter baumannii NOT DETECTED NOT DETECTED   Enterobacteriaceae species DETECTED (A) NOT DETECTED   Enterobacter cloacae complex NOT DETECTED NOT DETECTED   Escherichia coli DETECTED (A) NOT DETECTED   Klebsiella oxytoca NOT DETECTED NOT DETECTED   Klebsiella pneumoniae NOT DETECTED NOT DETECTED   Proteus species NOT DETECTED NOT DETECTED   Serratia marcescens NOT DETECTED NOT DETECTED   Carbapenem resistance NOT DETECTED NOT DETECTED   Haemophilus influenzae NOT DETECTED NOT DETECTED   Neisseria meningitidis NOT DETECTED NOT DETECTED   Pseudomonas aeruginosa NOT DETECTED NOT DETECTED   Candida albicans NOT DETECTED NOT DETECTED   Candida glabrata NOT DETECTED NOT DETECTED   Candida krusei NOT DETECTED NOT DETECTED   Candida  parapsilosis NOT DETECTED NOT DETECTED   Candida tropicalis NOT DETECTED NOT DETECTED   Thank you for allowing pharmacy to be a part of this patient's care.  Leron Croak, PharmD PGY1 Pharmacy Resident Phone: [phone]  Please check AMION for all [facility] phone numbers 05/07/2019  7:52 AM

## 2019-05-08 ENCOUNTER — Encounter (HOSPITAL_COMMUNITY): Payer: Self-pay | Admitting: *Deleted

## 2019-05-08 ENCOUNTER — Encounter (HOSPITAL_COMMUNITY)
Admission: EM | Disposition: A | Discharge status: Home / Self Care | Payer: Self-pay | Source: Home / Self Care | Attending: Urology

## 2019-05-08 ENCOUNTER — Inpatient Hospital Stay (HOSPITAL_COMMUNITY): Payer: PPO | Admitting: Certified Registered Nurse Anesthetist

## 2019-05-08 ENCOUNTER — Inpatient Hospital Stay (HOSPITAL_COMMUNITY): Payer: PPO

## 2019-05-08 HISTORY — PX: CYSTOSCOPY W/ URETERAL STENT PLACEMENT: SHX1429

## 2019-05-08 LAB — CBC WITH DIFFERENTIAL/PLATELET
Abs Immature Granulocytes: 0.04 10*3/uL (ref 0.00–0.07)
Basophils Absolute: 0 10*3/uL (ref 0.0–0.1)
Basophils Relative: 0 %
Eosinophils Absolute: 0 10*3/uL (ref 0.0–0.5)
Eosinophils Relative: 0 %
HCT: 28 % — ABNORMAL LOW (ref 39.0–52.0)
Hemoglobin: 9.3 g/dL — ABNORMAL LOW (ref 13.0–17.0)
Immature Granulocytes: 1 %
Lymphocytes Relative: 7 %
Lymphs Abs: 0.5 10*3/uL — ABNORMAL LOW (ref 0.7–4.0)
MCH: 26.5 pg (ref 26.0–34.0)
MCHC: 33.2 g/dL (ref 30.0–36.0)
MCV: 79.8 fL — ABNORMAL LOW (ref 80.0–100.0)
Monocytes Absolute: 0.5 10*3/uL (ref 0.1–1.0)
Monocytes Relative: 9 %
Neutro Abs: 5.3 10*3/uL (ref 1.7–7.7)
Neutrophils Relative %: 83 %
Platelets: 203 10*3/uL (ref 150–400)
RBC: 3.51 MIL/uL — ABNORMAL LOW (ref 4.22–5.81)
RDW: 14.2 % (ref 11.5–15.5)
WBC: 6.4 10*3/uL (ref 4.0–10.5)
nRBC: 0 % (ref 0.0–0.2)

## 2019-05-08 LAB — COMPREHENSIVE METABOLIC PANEL
ALT: 21 U/L (ref 0–44)
AST: 16 U/L (ref 15–41)
Albumin: 2.5 g/dL — ABNORMAL LOW (ref 3.5–5.0)
Alkaline Phosphatase: 131 U/L — ABNORMAL HIGH (ref 38–126)
Anion gap: 12 (ref 5–15)
BUN: 13 mg/dL (ref 8–23)
CO2: 22 mmol/L (ref 22–32)
Calcium: 8.6 mg/dL — ABNORMAL LOW (ref 8.9–10.3)
Chloride: 102 mmol/L (ref 98–111)
Creatinine, Ser: 1.23 mg/dL (ref 0.61–1.24)
GFR calc Af Amer: >60 mL/min (ref >60)
GFR calc non Af Amer: 60 mL/min — ABNORMAL LOW (ref >60)
Glucose, Bld: 119 mg/dL — ABNORMAL HIGH (ref 70–99)
Potassium: 3.6 mmol/L (ref 3.5–5.1)
Sodium: 136 mmol/L (ref 135–145)
Total Bilirubin: 1 mg/dL (ref 0.3–1.2)
Total Protein: 6.5 g/dL (ref 6.5–8.1)

## 2019-05-08 LAB — HEMOGLOBIN A1C
Hgb A1c MFr Bld: 5.3 % (ref 4.8–5.6)
Mean Plasma Glucose: 105.41 mg/dL

## 2019-05-08 LAB — GLUCOSE, CAPILLARY: Glucose-Capillary: 179 mg/dL — ABNORMAL HIGH (ref 70–99)

## 2019-05-08 LAB — SURGICAL PCR SCREEN
MRSA, PCR: NEGATIVE
Staphylococcus aureus: NEGATIVE

## 2019-05-08 SURGERY — CYSTOSCOPY, WITH RETROGRADE PYELOGRAM AND URETERAL STENT INSERTION
Anesthesia: General | Site: Ureter | Laterality: Left

## 2019-05-08 MED ORDER — PROPOFOL 10 MG/ML IV BOLUS
INTRAVENOUS | Status: AC
  Filled 2019-05-08: qty 40

## 2019-05-08 MED ORDER — SODIUM CHLORIDE 0.9 % IR SOLN
Status: DC | PRN
  Administered 2019-05-08: 3000 mL

## 2019-05-08 MED ORDER — FENTANYL CITRATE (PF) 100 MCG/2ML IJ SOLN
INTRAMUSCULAR | Status: AC
  Filled 2019-05-08: qty 2

## 2019-05-08 MED ORDER — FENTANYL CITRATE (PF) 100 MCG/2ML IJ SOLN
25.0000 ug | INTRAMUSCULAR | Status: DC | PRN
  Administered 2019-05-08: 50 ug via INTRAVENOUS

## 2019-05-08 MED ORDER — ONDANSETRON HCL 4 MG/2ML IJ SOLN
INTRAMUSCULAR | Status: AC
  Filled 2019-05-08: qty 2

## 2019-05-08 MED ORDER — MUPIROCIN 2 % EX OINT: 1.0000 "application " | TOPICAL_OINTMENT | Freq: Two times a day (BID) | CUTANEOUS | Status: DC

## 2019-05-08 MED ORDER — PHENYLEPHRINE 40 MCG/ML (10ML) SYRINGE FOR IV PUSH (FOR BLOOD PRESSURE SUPPORT)
PREFILLED_SYRINGE | INTRAVENOUS | Status: DC | PRN
  Administered 2019-05-08: 40 ug via INTRAVENOUS
  Administered 2019-05-08 (×2): 80 ug via INTRAVENOUS

## 2019-05-08 MED ORDER — ONDANSETRON HCL 4 MG/2ML IJ SOLN
INTRAMUSCULAR | Status: DC | PRN
  Administered 2019-05-08: 4 mg via INTRAVENOUS

## 2019-05-08 MED ORDER — FENTANYL CITRATE (PF) 100 MCG/2ML IJ SOLN
INTRAMUSCULAR | Status: DC | PRN
  Administered 2019-05-08: 25 ug via INTRAVENOUS
  Administered 2019-05-08: 50 ug via INTRAVENOUS

## 2019-05-08 MED ORDER — DEXAMETHASONE SODIUM PHOSPHATE 10 MG/ML IJ SOLN
INTRAMUSCULAR | Status: AC
  Filled 2019-05-08: qty 1

## 2019-05-08 MED ORDER — DEXAMETHASONE SODIUM PHOSPHATE 10 MG/ML IJ SOLN
INTRAMUSCULAR | Status: DC | PRN
  Administered 2019-05-08: 5 mg via INTRAVENOUS

## 2019-05-08 MED ORDER — IOHEXOL 300 MG/ML  SOLN
INTRAMUSCULAR | Status: DC | PRN
  Administered 2019-05-08: 8 mL

## 2019-05-08 MED ORDER — ONDANSETRON HCL 4 MG/2ML IJ SOLN: 4.0000 mg | Freq: Once | INTRAMUSCULAR | Status: DC | PRN

## 2019-05-08 MED ORDER — LIDOCAINE 2% (20 MG/ML) 5 ML SYRINGE
INTRAMUSCULAR | Status: AC
  Filled 2019-05-08: qty 5

## 2019-05-08 MED ORDER — PROPOFOL 10 MG/ML IV BOLUS
INTRAVENOUS | Status: DC | PRN
  Administered 2019-05-08: 90 mg via INTRAVENOUS

## 2019-05-08 MED ORDER — ACETAMINOPHEN 10 MG/ML IV SOLN: 1000.0000 mg | Freq: Once | INTRAVENOUS | Status: DC | PRN

## 2019-05-08 MED ORDER — LIDOCAINE 2% (20 MG/ML) 5 ML SYRINGE
INTRAMUSCULAR | Status: DC | PRN
  Administered 2019-05-08: 100 mg via INTRAVENOUS

## 2019-05-08 MED ORDER — LACTATED RINGERS IV SOLN
INTRAVENOUS | Status: AC
  Administered 2019-05-08: 10:00:00 via INTRAVENOUS

## 2019-05-08 MED ORDER — PHENYLEPHRINE 40 MCG/ML (10ML) SYRINGE FOR IV PUSH (FOR BLOOD PRESSURE SUPPORT)
PREFILLED_SYRINGE | INTRAVENOUS | Status: AC
  Filled 2019-05-08: qty 10

## 2019-05-08 SURGICAL SUPPLY — 15 items
BAG URO CATCHER STRL LF (MISCELLANEOUS) ×3 IMPLANT
CATH INTERMIT  6FR 70CM (CATHETERS) ×3 IMPLANT
CATH URET 5FR 28IN OPEN ENDED (CATHETERS) ×3 IMPLANT
CLOTH BEACON ORANGE TIMEOUT ST (SAFETY) ×3 IMPLANT
GLOVE BIOGEL M STRL SZ7.5 (GLOVE) ×3 IMPLANT
GOWN STRL REUS W/TWL LRG LVL3 (GOWN DISPOSABLE) ×6 IMPLANT
GUIDEWIRE STR DUAL SENSOR (WIRE) IMPLANT
GUIDEWIRE ZIPWRE .038 STRAIGHT (WIRE) ×3 IMPLANT
KIT TURNOVER KIT A (KITS) IMPLANT
MANIFOLD NEPTUNE II (INSTRUMENTS) ×3 IMPLANT
PACK CYSTO (CUSTOM PROCEDURE TRAY) ×3 IMPLANT
STENT POLARIS LOOP 8FR X 24 CM (STENTS) ×3 IMPLANT
TUBING CONNECTING 10 (TUBING) ×2 IMPLANT
TUBING CONNECTING 10' (TUBING) ×1
TUBING UROLOGY SET (TUBING) IMPLANT

## 2019-05-08 NOTE — Progress Notes (Signed)
Subjective: [name] is without complaints of pain today.  The CT showed moderate left hydro with probable left stent obstruction.   He will need to have the stent exchanged.   He is improving on therapy with a declining fever curve and some improvement in his Cr.  He is currently on Xtandi for his CRCP with mets which have progressed on CT.  ROS:  Review of Systems  Unable to perform ROS: Language    Anti-infectives: Anti-infectives (From admission, onward)   Start     Dose/Rate Route Frequency Ordered Stop   05/08/19 0500  cefTRIAXone (ROCEPHIN) 2 g in sodium chloride 0.9 % 100 mL IVPB     2 g 200 mL/hr over 30 Minutes Intravenous Every 24 hours 05/07/19 0803     05/07/19 0500  cefTRIAXone (ROCEPHIN) 1 g in sodium chloride 0.9 % 100 mL IVPB  Status:  Discontinued     1 g 200 mL/hr over 30 Minutes Intravenous Every 24 hours 05/06/19 1848 05/07/19 0803   05/06/19 1715  ceFEPIme (MAXIPIME) 2 g in sodium chloride 0.9 % 100 mL IVPB     2 g 200 mL/hr over 30 Minutes Intravenous  Once 05/06/19 1712 05/06/19 1949      Current Facility-Administered Medications  Medication Dose Route Frequency Provider Last Rate Last Dose  . acetaminophen (TYLENOL) tablet 650 mg  650 mg Oral Q6H PRN Lenore Cordia, MD   650 mg at 05/07/19 1830   Or  . acetaminophen (TYLENOL) suppository 650 mg  650 mg Rectal Q6H PRN Zada Finders R, MD      . cefTRIAXone (ROCEPHIN) 2 g in sodium chloride 0.9 % 100 mL IVPB  2 g Intravenous Q24H Florencia Reasons, MD 200 mL/hr at 05/08/19 0554 2 g at 05/08/19 0554  . enzalutamide (XTANDI) capsule 160 mg  160 mg Oral Daily Lenore Cordia, MD   160 mg at 05/07/19 0902  . heparin injection 5,000 Units  5,000 Units Subcutaneous Q8H Zada Finders R, MD   5,000 Units at 05/08/19 0600  . ondansetron (ZOFRAN) tablet 4 mg  4 mg Oral Q6H PRN Lenore Cordia, MD       Or  . ondansetron (ZOFRAN) injection 4 mg  4 mg Intravenous Q6H PRN Lenore Cordia, MD      . oxyCODONE-acetaminophen  (PERCOCET/ROXICET) 5-325 MG per tablet 1 tablet  1 tablet Oral Q6H PRN Lenore Cordia, MD      . senna-docusate (Senokot-S) tablet 1 tablet  1 tablet Oral QHS PRN Lenore Cordia, MD       Facility-Administered Medications Ordered in Other Encounters  Medication Dose Route Frequency Provider Last Rate Last Dose  . heparin lock flush 100 unit/mL  500 Units Intracatheter Once Wyatt Portela, MD      . sodium chloride flush (NS) 0.9 % injection 10 mL  10 mL Intracatheter Once Wyatt Portela, MD      . sodium chloride flush (NS) 0.9 % injection 10 mL  10 mL Intracatheter PRN Wyatt Portela, MD   10 mL at 09/30/18 1236     Objective: Vital signs in last 24 hours: Temp:  [98.1 F (36.7 C)-101 F (38.3 C)] 99.3 F (37.4 C) (05/18 0549) Pulse Rate:  [77-96] 81 (05/18 0549) Resp:  [18-19] 18 (05/18 0549) BP: (121-132)/(71-77) 129/74 (05/18 0549) SpO2:  [97 %-100 %] 97 % (05/18 0549)  Intake/Output from previous day: 05/17 0701 - 05/18 0700 In: 314 [P.O.:294; IV Piggyback:20] Out: -  Intake/Output this shift: No intake/output data recorded.   Physical Exam Vitals signs reviewed.  Constitutional:      Appearance: Normal appearance.  Cardiovascular:     Rate and Rhythm: Normal rate and regular rhythm.  Pulmonary:     Effort: Pulmonary effort is normal. No respiratory distress.  Abdominal:     General: Abdomen is flat.     Palpations: Abdomen is soft.     Tenderness: There is no abdominal tenderness. There is no left CVA tenderness.  Neurological:     Mental Status: He is alert.     Lab Results:  Recent Labs    05/07/19 0323 05/08/19 0252  WBC 5.6 6.4  HGB 9.0* 9.3*  HCT 27.3* 28.0*  PLT 174 203   BMET Recent Labs    05/07/19 0323 05/08/19 0252  NA 138 136  K 4.2 3.6  CL 105 102  CO2 24 22  GLUCOSE 128* 119*  BUN 14 13  CREATININE 1.29* 1.23  CALCIUM 8.5* 8.6*   PT/INR No results for input(s): LABPROT, INR in the last 72 hours. ABG No results for  input(s): PHART, HCO3 in the last 72 hours.  Invalid input(s): PCO2, PO2  Studies/Results: Ct Abdomen Pelvis W Contrast  Result Date: 05/07/2019 CLINICAL DATA:  Evaluate hydronephrosis, metastatic prostate cancer EXAM: CT ABDOMEN AND PELVIS WITH CONTRAST TECHNIQUE: Multidetector CT imaging of the abdomen and pelvis was performed using the standard protocol following bolus administration of intravenous contrast. CONTRAST:  153mL OMNIPAQUE IOHEXOL 300 MG/ML  SOLN COMPARISON:  11/25/2018 FINDINGS: Lower chest: Small bilateral pleural effusions. Partially imaged 1.0 cm pulmonary nodule of the right middle lobe (series 4, image 1). There are new small pulmonary nodules of the right lung, for example a 3 mm fissural nodule (series 4, image 1). Small bilateral pleural effusions and associated atelectasis or consolidation. Hepatobiliary: Multiple new and enlarged hypodense lesions of the liver, largest lesion of the right lobe measuring 2.6 cm, previously 11 mm when measured similarly (series 3, image 23). No gallstones, gallbladder wall thickening, or biliary dilatation. Pancreas: Unremarkable. No pancreatic ductal dilatation or surrounding inflammatory changes. Spleen: Normal in size without focal abnormality. Adrenals/Urinary Tract: Adrenal glands are unremarkable. There is moderate left hydronephrosis and hydroureter with a single pigtail loop type ureteral stent. Stomach/Bowel: Stomach is within normal limits. Appendix appears normal. No evidence of bowel wall thickening, distention, or inflammatory changes. Vascular/Lymphatic: Calcific atherosclerosis. No enlarged abdominal or pelvic lymph nodes. Reproductive: Status post prostatectomy. Other: No abdominal wall hernia or abnormality. No abdominopelvic ascites. Musculoskeletal: No acute or significant osseous findings. IMPRESSION: 1. There is moderate left hydronephrosis and hydroureter with a single pigtail loop type ureteral stent. No obvious explanation for  failure of stent to decompress the collecting system. 2. Partially imaged 1.0 cm pulmonary nodule of the right middle lobe (series 4, image 1). There are new small pulmonary nodules of the right lung, for example a 3 mm fissural nodule (series 4, image 1). Small bilateral pleural effusions and associated atelectasis or consolidation. Multiple new and enlarged hypodense lesions of the liver, largest lesion of the right lobe measuring 2.6 cm, previously 11 mm when measured similarly (series 3, image 23). Findings are consistent with worsening metastatic disease. 3. Chronic, incidental, and postoperative findings as detailed above. Electronically Signed   By: Eddie Candle M.D.   On: 05/07/2019 16:49   US Renal  Result Date: 05/06/2019 CLINICAL DATA:  Question ureteral obstruction. History of prostate cancer with prostate resection. EXAM: RENAL / URINARY  TRACT ULTRASOUND COMPLETE COMPARISON:  Nuclear medicine renogram 01/30/2019 FINDINGS: Right Kidney: Renal measurements: 11 x 4.6 x 6.2 cm = volume: 164.1 mL . Echogenicity within normal limits. No mass or hydronephrosis visualized. Left Kidney: Renal measurements: 12 x 6.5 x 6.7 cm = volume: 270.5 mL. Left ureteral stent in place. Moderate hydronephrosis. Simple appearing cyst noted within the inferior pole of the left kidney measuring 1.5 cm. Bladder: Appears normal for degree of bladder distention. IMPRESSION: 1. Moderate left hydronephrosis status post left ureteral stenting on 02/06/2019 Electronically Signed   By: Kerby Moors M.D.   On: 05/06/2019 19:42   Dg Chest Port 1 View  Result Date: 05/06/2019 CLINICAL DATA:  Shortness of breath. EXAM: PORTABLE CHEST 1 VIEW COMPARISON:  February 03, 2018 FINDINGS: Stable right Port-A-Cath, in good position. The heart, hila, mediastinum, lungs, and pleura are otherwise unremarkable. IMPRESSION: No active disease. Electronically Signed   By: Dorise Bullion III M.D   On: 05/06/2019 17:05   I have reviewed his  imaging and labs.   Assessment and Plan: Left ureteral obstruction with probable stent obstruction.   He is going to need to have the stent changed in the next 24 hrs.   I will make him NPO get that scheduled.  E. Coli sepsis with AKI.  He is improving on current therapy.   Metastatic prostate cancer s/p orchiectomy with prior Zytiga and Docetaxal and now on Xtandi with progressive disease on CT and PSA progression.  He is followed by Dr. Alen Blew.       LOS: 1 day    [name] 05/08/2019 [phone]

## 2019-05-08 NOTE — Plan of Care (Signed)

## 2019-05-08 NOTE — Progress Notes (Signed)
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 Patient is [place] Rhade speaking and his wife assists with interpretation  Patient has been admitted with sepsis secondary to UTI with E. coli bacteremia and left-sided nephrosis.  He has been seen by urology with plans for stent exchange today and will be transferred to [facility].   Assessment & Plan:   Principal Problem:   Sepsis ([place]) Active Problems:   Prostate CA ([facility])   AKI (acute kidney injury) ([place])   UTI (urinary tract infection)   Bacteremia due to Gram-negative bacteria   Sepsis, Gram negative ([facility])  Sepsis from UTI, E. coli bacteremia Rocephin 2g daily -Further sensitivities pending  Left sided hydronephrosis, h/o metastatic  prostate cancer s/p stent to left ureter in 01/2019 -urology consulted with plans for stent exchange at [facility] long today discussed with Dr. Jeffie Pollock -We will plan to transfer and keep n.p.o. for now  AKI-improving Cr 0.9 at baseline Cr 1.34 on presentation and 1.23 today Treat uti and hydronephrosis Renal dosing meds  Hypokalemia Replaced We will recheck a.m. labs  Elevated am blood glucose, hemoglobin A1c 5.3% -No prior diabetes diagnosis noted this does not appear to be the case despite some mild elevations  Metastatic prostate cancer status post orchiectomy -Now on Xtandi with progressive disease on imaging with PSA progression -Follows with Dr. Alen Blew    DVT prophylaxis: Heparin Code Status: Full Family Communication: None at bedside, patient does speak some [place] Disposition Plan: Transfer to [place] per discussion with Dr. Jeffie Pollock of urology for stent exchange today.  Patient has been made  n.p.o.   Consultants:   Urology  Procedures:   None  Antimicrobials:  Anti-infectives (From admission, onward)   Start     Dose/Rate Route Frequency Ordered Stop   05/08/19 0500  cefTRIAXone (ROCEPHIN) 2 g in sodium chloride 0.9 % 100 mL IVPB     2 g 200 mL/hr over 30 Minutes Intravenous Every 24 hours 05/07/19 0803     05/07/19 0500  cefTRIAXone (ROCEPHIN) 1 g in sodium chloride 0.9 % 100 mL IVPB  Status:  Discontinued     1 g 200 mL/hr over 30 Minutes Intravenous Every 24 hours 05/06/19 1848 05/07/19 0803   05/06/19 1715  ceFEPIme (MAXIPIME) 2 g in sodium chloride 0.9 % 100 mL IVPB     2 g 200 mL/hr over 30 Minutes Intravenous  Once 05/06/19 1712 05/06/19 1949       Subjective: Patient seen and evaluated today with no new acute complaints or concerns. No acute concerns or events noted overnight.  Patient appears to have no significant fevers this a.m. and labs are improved.  Urology plans for stent exchange today.  Objective: Vitals:   05/07/19 1103 05/07/19 1814 05/07/19 2147 05/08/19 0549  BP:  121/71 132/77 129/74  Pulse:  96 77 81  Resp:  19 18 18   Temp: 98.6 F (37 C) (!) 101 F (38.3 C) 98.1 F (36.7 C) 99.3 F (37.4 C)  TempSrc: Oral Oral Oral Oral  SpO2:  98% 100% 97%  Weight:      Height:        Intake/Output Summary (Last 24 hours) at 05/08/2019 0626 Last data filed  at 05/08/2019 0600 Gross per 24 hour  Intake 314 ml  Output --  Net 314 ml   Filed Weights   05/06/19 1529 05/07/19 0702  Weight: 58.5 kg 59.3 kg    Examination:  General exam: Appears calm and comfortable  Respiratory system: Clear to auscultation. Respiratory effort normal. Cardiovascular system: S1 & S2 heard, RRR. No JVD, murmurs, rubs, gallops or clicks. No pedal edema. Gastrointestinal system: Abdomen is nondistended, soft and nontender. No organomegaly or masses felt. Normal bowel sounds heard. Central nervous system: Alert and oriented. No focal neurological  deficits. Extremities: Symmetric 5 x 5 power. Skin: No rashes, lesions or ulcers Psychiatry: Judgement and insight appear normal. Mood & affect appropriate.     Data Reviewed: I have personally reviewed following labs and imaging studies  CBC: Recent Labs  Lab 05/06/19 1607 05/07/19 0323 05/08/19 0252  WBC 8.0 5.6 6.4  NEUTROABS 6.9  --  5.3  HGB 10.1* 9.0* 9.3*  HCT 30.2* 27.3* 28.0*  MCV 80.1 79.8* 79.8*  PLT 176 174 176   Basic Metabolic Panel: Recent Labs  Lab 05/06/19 1607 05/07/19 0323 05/08/19 0252  NA 133* 138 136  K 3.1* 4.2 3.6  CL 102 105 102  CO2 21* 24 22  GLUCOSE 129* 128* 119*  BUN 15 14 13   CREATININE 1.34* 1.29* 1.23  CALCIUM 8.2* 8.5* 8.6*   GFR: Estimated Creatinine Clearance: 43.8 mL/min (by C-G formula based on SCr of 1.23 mg/dL). Liver Function Tests: Recent Labs  Lab 05/06/19 1607 05/08/19 0252  AST 19 16  ALT 22 21  ALKPHOS 112 131*  BILITOT 1.6* 1.0  PROT 6.7 6.5  ALBUMIN 2.9* 2.5*   No results for input(s): LIPASE, AMYLASE in the last 168 hours. No results for input(s): AMMONIA in the last 168 hours. Coagulation Profile: No results for input(s): INR, PROTIME in the last 168 hours. Cardiac Enzymes: No results for input(s): CKTOTAL, CKMB, CKMBINDEX, TROPONINI in the last 168 hours. BNP (last 3 results) No results for input(s): PROBNP in the last 8760 hours. HbA1C: Recent Labs    05/08/19 0252  HGBA1C 5.3   CBG: No results for input(s): GLUCAP in the last 168 hours. Lipid Profile: No results for input(s): CHOL, HDL, LDLCALC, TRIG, CHOLHDL, LDLDIRECT in the last 72 hours. Thyroid Function Tests: No results for input(s): TSH, T4TOTAL, FREET4, T3FREE, THYROIDAB in the last 72 hours. Anemia Panel: No results for input(s): VITAMINB12, FOLATE, FERRITIN, TIBC, IRON, RETICCTPCT in the last 72 hours. Sepsis Labs: Recent Labs  Lab 05/06/19 1625  LATICACIDVEN 1.2    Recent Results (from the past 240 hour(s))  Blood culture  (routine x 2)     Status: None (Preliminary result)   Collection Time: 05/06/19  4:06 PM  Result Value Ref Range Status   Specimen Description BLOOD RIGHT ANTECUBITAL  Final   Special Requests   Final    BOTTLES DRAWN AEROBIC AND ANAEROBIC Blood Culture adequate volume   Culture   Final    NO GROWTH < 24 HOURS Performed at [facility], [place] [address], [place], [place] [address]    Report Status PENDING  Incomplete  SARS Coronavirus 2 (CEPHEID- Performed in [place] hospital lab), Hosp Order     Status: None   Collection Time: 05/06/19  4:07 PM  Result Value Ref Range Status   SARS Coronavirus 2 NEGATIVE NEGATIVE Final    Comment: (NOTE) If result is NEGATIVE SARS-CoV-2 target nucleic acids are NOT DETECTED. The SARS-CoV-2 RNA  is generally detectable in upper and lower  respiratory specimens during the acute phase of infection. The lowest  concentration of SARS-CoV-2 viral copies this assay can detect is 250  copies / mL. A negative result does not preclude SARS-CoV-2 infection  and should not be used as the sole basis for treatment or other  patient management decisions.  A negative result may occur with  improper specimen collection / handling, submission of specimen other  than nasopharyngeal swab, presence of viral mutation(s) within the  areas targeted by this assay, and inadequate number of viral copies  (<250 copies / mL). A negative result must be combined with clinical  observations, patient history, and epidemiological information. If result is POSITIVE SARS-CoV-2 target nucleic acids are DETECTED. The SARS-CoV-2 RNA is generally detectable in upper and lower  respiratory specimens dur ing the acute phase of infection.  Positive  results are indicative of active infection with SARS-CoV-2.  Clinical  correlation with patient history and other diagnostic information is  necessary to determine patient infection status.  Positive results do  not rule out bacterial  infection or co-infection with other viruses. If result is PRESUMPTIVE POSTIVE SARS-CoV-2 nucleic acids MAY BE PRESENT.   A presumptive positive result was obtained on the submitted specimen  and confirmed on repeat testing.  While 2019 novel coronavirus  (SARS-CoV-2) nucleic acids may be present in the submitted sample  additional confirmatory testing may be necessary for epidemiological  and / or clinical management purposes  to differentiate between  SARS-CoV-2 and other Sarbecovirus currently known to infect humans.  If clinically indicated additional testing with an alternate test  methodology [phone]) is advised. The SARS-CoV-2 RNA is generally  detectable in upper and lower respiratory sp ecimens during the acute  phase of infection. The expected result is Negative. Fact Sheet for Patients:  StrictlyIdeas.no Fact Sheet for Healthcare Providers: BankingDealers.co.za This test is not yet approved or cleared by the [place] FDA and has been authorized for detection and/or diagnosis of SARS-CoV-2 by FDA under an Emergency Use Authorization (EUA).  This EUA will remain in effect (meaning this test can be used) for the duration of the COVID-19 declaration under Section 564(b)(1) of the Act, 21 U.S.C. section 360bbb-3(b)(1), unless the authorization is terminated or revoked sooner. Performed at [facility], [place] [address], [place], [place] [address]   Blood culture (routine x 2)     Status: None (Preliminary result)   Collection Time: 05/06/19  4:50 PM  Result Value Ref Range Status   Specimen Description BLOOD LEFT ANTECUBITAL  Final   Special Requests   Final    BOTTLES DRAWN AEROBIC AND ANAEROBIC Blood Culture adequate volume   Culture  Setup Time   Final    IN BOTH AEROBIC AND ANAEROBIC BOTTLES GRAM NEGATIVE RODS CRITICAL RESULT CALLED TO, READ BACK BY AND VERIFIED WITH: PHARMD Maitland S [id number] FCP Performed at  [facility], [place] [address], [place], [place] [address]    Culture GRAM NEGATIVE RODS  Final   Report Status PENDING  Incomplete  Blood Culture ID Panel (Reflexed)     Status: Abnormal   Collection Time: 05/06/19  4:50 PM  Result Value Ref Range Status   Enterococcus species NOT DETECTED NOT DETECTED Final   Listeria monocytogenes NOT DETECTED NOT DETECTED Final   Staphylococcus species NOT DETECTED NOT DETECTED Final   Staphylococcus aureus (BCID) NOT DETECTED NOT DETECTED Final   Streptococcus species NOT DETECTED NOT DETECTED  Final   Streptococcus agalactiae NOT DETECTED NOT DETECTED Final   Streptococcus pneumoniae NOT DETECTED NOT DETECTED Final   Streptococcus pyogenes NOT DETECTED NOT DETECTED Final   Acinetobacter baumannii NOT DETECTED NOT DETECTED Final   Enterobacteriaceae species DETECTED (A) NOT DETECTED Final    Comment: Enterobacteriaceae represent a large family of gram-negative bacteria, not a single organism. CRITICAL RESULT CALLED TO, READ BACK BY AND VERIFIED WITH: PHARMD Buckhorn S [id number] FCP    Enterobacter cloacae complex NOT DETECTED NOT DETECTED Final   Escherichia coli DETECTED (A) NOT DETECTED Final    Comment: CRITICAL RESULT CALLED TO, READ BACK BY AND VERIFIED WITH: PHARMD REBECCA S [phone] [id number] FCP    Klebsiella oxytoca NOT DETECTED NOT DETECTED Final   Klebsiella pneumoniae NOT DETECTED NOT DETECTED Final   Proteus species NOT DETECTED NOT DETECTED Final   Serratia marcescens NOT DETECTED NOT DETECTED Final   Carbapenem resistance NOT DETECTED NOT DETECTED Final   Haemophilus influenzae NOT DETECTED NOT DETECTED Final   Neisseria meningitidis NOT DETECTED NOT DETECTED Final   Pseudomonas aeruginosa NOT DETECTED NOT DETECTED Final   Candida albicans NOT DETECTED NOT DETECTED Final   Candida glabrata NOT DETECTED NOT DETECTED Final   Candida krusei NOT DETECTED NOT DETECTED Final   Candida parapsilosis NOT DETECTED NOT DETECTED Final    Candida tropicalis NOT DETECTED NOT DETECTED Final    Comment: Performed at [facility], [place] [address], [place], [place] [address]         Radiology Studies: Ct Abdomen Pelvis W Contrast  Result Date: 05/07/2019 CLINICAL DATA:  Evaluate hydronephrosis, metastatic prostate cancer EXAM: CT ABDOMEN AND PELVIS WITH CONTRAST TECHNIQUE: Multidetector CT imaging of the abdomen and pelvis was performed using the standard protocol following bolus administration of intravenous contrast. CONTRAST:  136mL OMNIPAQUE IOHEXOL 300 MG/ML  SOLN COMPARISON:  11/25/2018 FINDINGS: Lower chest: Small bilateral pleural effusions. Partially imaged 1.0 cm pulmonary nodule of the right middle lobe (series 4, image 1). There are new small pulmonary nodules of the right lung, for example a 3 mm fissural nodule (series 4, image 1). Small bilateral pleural effusions and associated atelectasis or consolidation. Hepatobiliary: Multiple new and enlarged hypodense lesions of the liver, largest lesion of the right lobe measuring 2.6 cm, previously 11 mm when measured similarly (series 3, image 23). No gallstones, gallbladder wall thickening, or biliary dilatation. Pancreas: Unremarkable. No pancreatic ductal dilatation or surrounding inflammatory changes. Spleen: Normal in size without focal abnormality. Adrenals/Urinary Tract: Adrenal glands are unremarkable. There is moderate left hydronephrosis and hydroureter with a single pigtail loop type ureteral stent. Stomach/Bowel: Stomach is within normal limits. Appendix appears normal. No evidence of bowel wall thickening, distention, or inflammatory changes. Vascular/Lymphatic: Calcific atherosclerosis. No enlarged abdominal or pelvic lymph nodes. Reproductive: Status post prostatectomy. Other: No abdominal wall hernia or abnormality. No abdominopelvic ascites. Musculoskeletal: No acute or significant osseous findings. IMPRESSION: 1. There is moderate left hydronephrosis and  hydroureter with a single pigtail loop type ureteral stent. No obvious explanation for failure of stent to decompress the collecting system. 2. Partially imaged 1.0 cm pulmonary nodule of the right middle lobe (series 4, image 1). There are new small pulmonary nodules of the right lung, for example a 3 mm fissural nodule (series 4, image 1). Small bilateral pleural effusions and associated atelectasis or consolidation. Multiple new and enlarged hypodense lesions of the liver, largest lesion of the right lobe measuring 2.6 cm, previously 11 mm when measured similarly (series 3,  image 23). Findings are consistent with worsening metastatic disease. 3. Chronic, incidental, and postoperative findings as detailed above. Electronically Signed   By: Eddie Candle M.D.   On: 05/07/2019 16:49   US Renal  Result Date: 05/06/2019 CLINICAL DATA:  Question ureteral obstruction. History of prostate cancer with prostate resection. EXAM: RENAL / URINARY TRACT ULTRASOUND COMPLETE COMPARISON:  Nuclear medicine renogram 01/30/2019 FINDINGS: Right Kidney: Renal measurements: 11 x 4.6 x 6.2 cm = volume: 164.1 mL . Echogenicity within normal limits. No mass or hydronephrosis visualized. Left Kidney: Renal measurements: 12 x 6.5 x 6.7 cm = volume: 270.5 mL. Left ureteral stent in place. Moderate hydronephrosis. Simple appearing cyst noted within the inferior pole of the left kidney measuring 1.5 cm. Bladder: Appears normal for degree of bladder distention. IMPRESSION: 1. Moderate left hydronephrosis status post left ureteral stenting on 02/06/2019 Electronically Signed   By: Kerby Moors M.D.   On: 05/06/2019 19:42   Dg Chest Port 1 View  Result Date: 05/06/2019 CLINICAL DATA:  Shortness of breath. EXAM: PORTABLE CHEST 1 VIEW COMPARISON:  February 03, 2018 FINDINGS: Stable right Port-A-Cath, in good position. The heart, hila, mediastinum, lungs, and pleura are otherwise unremarkable. IMPRESSION: No active disease. Electronically  Signed   By: Dorise Bullion III M.D   On: 05/06/2019 17:05        Scheduled Meds:  enzalutamide  160 mg Oral Daily   heparin  5,000 Units Subcutaneous Q8H   Continuous Infusions:  cefTRIAXone (ROCEPHIN)  IV 2 g (05/08/19 0554)   lactated ringers       LOS: 1 day    Time spent: 30 minutes    Zerina Hallinan Darleen Crocker, DO [facility] Pager [phone]  If 7PM-7AM, please contact night-coverage [email or web address] Password [email or web address] 05/08/2019, 8:12 AM

## 2019-05-08 NOTE — Transfer of Care (Signed)
Immediate Anesthesia Transfer of Care Note  Patient: [name] Johnston  Procedure(s) Performed: CYSTOSCOPY WITH RETROGRADE PYELOGRAM/URETERAL LEFT STENT PLACEMENT (Left Ureter)  Patient Location: PACU  Anesthesia Type:General  Level of Consciousness: awake, alert , oriented and patient cooperative  Airway & Oxygen Therapy: Patient Spontanous Breathing and Patient connected to face mask oxygen  Post-op Assessment: Report given to RN and Post -op Vital signs reviewed and stable  Post vital signs: Reviewed and stable  Last Vitals:  Vitals Value Taken Time  BP 128/79 05/08/2019  5:45 PM  Temp    Pulse 78 05/08/2019  5:46 PM  Resp 22 05/08/2019  5:46 PM  SpO2 100 % 05/08/2019  5:46 PM  Vitals shown include unvalidated device data.  Last Pain:  Vitals:   05/08/19 1507  TempSrc: Oral  PainSc: 0-No pain         Complications: No apparent anesthesia complications

## 2019-05-08 NOTE — Op Note (Signed)
Operative Note  Preoperative diagnosis:  1.  Left ureteral obstruction 2.  History of castration resistant, metastatic prostate cancer  Postoperative diagnosis: Same  Procedure(s): 1.  Cystoscopy with left JJ stent exchange 2.  Left retrograde pyelogram with intraoperative interpretation of fluoroscopic imaging  Surgeon: Ellison Hughs, MD  Assistants:  None  Anesthesia:  General  Complications:  None  EBL: Less than 5 mL  Specimens: 1.  Previously placed 7 [place] by 24 cm Polaris stent was removed intact, inspected and discarded  Drains/Catheters: 1.  8 French x 24 cm Polaris stent without tether  Intraoperative findings:   1. Left retrograde pyelogram outlined a uniformly dilated left renal pelvis with no other filling defects  Indication:  [name] Johnston is a 68 y.o. male with a history of metastatic, castration resistant prostate cancer resulting in left ureteral obstruction.  The patient has required an indwelling left ureteral stent secondary to his obstruction.  Patient was recently found to be in acute renal failure secondary to stent occlusion as well as there being concern for concomitant urinary tract infection.  The patient has been consented for left ureteral stent exchange.  Description of procedure:  After informed consent was obtained, the patient was brought to the operating room and general LMA anesthesia was administered. The patient was then placed in the dorsolithotomy position and prepped and draped in the usual sterile fashion. A timeout was performed. A 23 French rigid cystoscope was then inserted into the urethral meatus and advanced into the bladder under direct vision. A complete bladder survey revealed no intravesical pathology.  His previously placed left ureteral stent was grasped at the distal tip and retracted to the urethra meatus.  A Glidewire was then advanced into the lumen of the stent up to the left renal pelvis, or fluoroscopic guidance.   The previously placed stent was removed intact, inspected and discarded.  A 6 French ureteral catheter was then advanced over the wire and into the proximal aspects of the left collecting system.  A left retrograde pyelogram was obtained, with the findings listed above.  The guidewire was then placed back through the lumen of the ureteral catheter and into position within the left renal pelvis, confirming placement via fluoroscopy.  A new 8 [place] by 24 cm Polaris stent was then placed over the wire and into good position within the left collecting system, confirming placement via fluoroscopy.  The bladder was then drained.  The patient tolerated the procedure well and was transferred to the postanesthesia in stable condition.  Plan: Continue to monitor renal function and infectious parameters.  Follow-up with Dr. Jeffie Pollock as an outpatient to plan his next stent exchange.

## 2019-05-08 NOTE — Anesthesia Preprocedure Evaluation (Addendum)
Anesthesia Evaluation  Patient identified by MRN, date of birth, ID band Patient awake    Reviewed: Allergy & Precautions, NPO status , Patient's Chart, lab work & pertinent test results  Airway Mallampati: II  TM Distance: >3 FB Neck ROM: Full    Dental no notable dental hx. (+) Teeth Intact   Pulmonary asthma ,    Pulmonary exam normal breath sounds clear to auscultation       Cardiovascular hypertension, Pt. on medications Normal cardiovascular exam Rhythm:Regular Rate:Normal     Neuro/Psych negative neurological ROS  negative psych ROS   GI/Hepatic Neg liver ROS,   Endo/Other    Renal/GU Cr 1.23 K+ 3.6   Hx of Prostate CA s/p radiation    Musculoskeletal   Abdominal   Peds  Hematology  (+) anemia , Hgb 9.3 Plt 203   Anesthesia Other Findings   Reproductive/Obstetrics                           Anesthesia Physical Anesthesia Plan  ASA: III  Anesthesia Plan: General   Post-op Pain Management:    Induction: Intravenous  PONV Risk Score and Plan: 3 and Treatment may vary due to age or medical condition, Ondansetron and Dexamethasone  Airway Management Planned: LMA  Additional Equipment:   Intra-op Plan:   Post-operative Plan:   Informed Consent: I have reviewed the patients History and Physical, chart, labs and discussed the procedure including the risks, benefits and alternatives for the proposed anesthesia with the patient or authorized representative who has indicated his/her understanding and acceptance.     Dental advisory given  Plan Discussed with:   Anesthesia Plan Comments: (GA w LMA)       Anesthesia Quick Evaluation

## 2019-05-08 NOTE — Anesthesia Postprocedure Evaluation (Signed)
Anesthesia Post Note  Patient: Lenwood Balsam  Procedure(s) Performed: CYSTOSCOPY WITH RETROGRADE PYELOGRAM/URETERAL LEFT STENT PLACEMENT (Left Ureter)     Patient location during evaluation: PACU Anesthesia Type: General Level of consciousness: awake and alert Pain management: pain level controlled Vital Signs Assessment: post-procedure vital signs reviewed and stable Respiratory status: spontaneous breathing, nonlabored ventilation, respiratory function stable and patient connected to nasal cannula oxygen Cardiovascular status: blood pressure returned to baseline and stable Postop Assessment: no apparent nausea or vomiting Anesthetic complications: no    Last Vitals:  Vitals:   05/08/19 1815 05/08/19 1830  BP: (!) 142/85 (!) 142/72  Pulse: 84 79  Resp: (!) 22 11  Temp:  36.9 C  SpO2: 98% 93%    Last Pain:  Vitals:   05/08/19 1830  TempSrc:   PainSc: 5                  Barnet [place]

## 2019-05-08 NOTE — Anesthesia Procedure Notes (Signed)
Procedure Name: LMA Insertion Date/Time: 05/08/2019 5:06 PM Performed by: West Pugh, CRNA Pre-anesthesia Checklist: Patient identified, Emergency Drugs available, Suction available, Patient being monitored and Timeout performed Patient Re-evaluated:Patient Re-evaluated prior to induction Oxygen Delivery Method: Circle system utilized Preoxygenation: Pre-oxygenation with 100% oxygen Induction Type: IV induction LMA: LMA with gastric port inserted LMA Size: 4.0 Number of attempts: 1 Placement Confirmation: positive ETCO2 and breath sounds checked- equal and bilateral Tube secured with: Tape Dental Injury: Teeth and Oropharynx as per pre-operative assessment

## 2019-05-09 ENCOUNTER — Encounter (HOSPITAL_COMMUNITY): Payer: Self-pay | Admitting: Urology

## 2019-05-09 LAB — CULTURE, BLOOD (ROUTINE X 2): Special Requests: ADEQUATE

## 2019-05-09 LAB — URINE CULTURE: Culture: >=100000 — AB

## 2019-05-09 MED ORDER — SULFAMETHOXAZOLE-TRIMETHOPRIM 800-160 MG PO TABS: 1.0000 | ORAL_TABLET | Freq: Two times a day (BID) | ORAL | 0 refills | Status: AC

## 2019-05-09 MED ORDER — SODIUM CHLORIDE 0.9 % IV SOLN
1.0000 g | Freq: Two times a day (BID) | INTRAVENOUS | Status: DC
  Administered 2019-05-09: 1 g via INTRAVENOUS
  Filled 2019-05-09 (×2): qty 1

## 2019-05-09 NOTE — Progress Notes (Signed)
Alphonzo Lemmings to be D/C'd Home per MD order.  Discussed with the patient and all questions fully answered.  VSS, Skin clean, dry and intact without evidence of skin break down, no evidence of skin tears noted. IV catheter discontinued intact. Site without signs and symptoms of complications. Dressing and pressure applied.  An After Visit Summary was printed and given to the patient. Patient received prescription.  D/c education completed with patient/family including follow up instructions, medication list, d/c activities limitations if indicated, with other d/c instructions as indicated by MD - patient able to verbalize understanding, all questions fully answered.   Patient instructed to return to ED, call 911, or call MD for any changes in condition.   Patient escorted via [place], and D/C home via private auto.  Luci Bank 05/09/2019 4:44 PM

## 2019-05-09 NOTE — Discharge Summary (Signed)
Physician Discharge Summary  [name] Johnston BOF:[id number] DOB: [birth year] DOA: 05/06/2019  PCP: [name], MD  Admit date: 05/06/2019  Discharge date: 05/09/2019  Admitted From:Home  Disposition:  Home  Recommendations for Outpatient Follow-up:  1. Follow up with PCP in 1-2 weeks 2. Follow up with Dr. Jeffie Pollock as scheduled 3. Continue on Bactrim for 5 more days given E Coli ESBL UTI and bacteremia 4. 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 Patient is [place] Rhade speaking and his wife assists with interpretation  Patient has been admitted with sepsis secondary to UTI with E. coli bacteremia and left-sided nephrosis.  He has been seen by urology and underwent stent exchange at [facility] on 5/18.  He is noted to have ESBL E. coli bacteremia and was given a dose of Merrem today.  Spoke with ID regarding antibiotic resistance and they recommended Bactrim for 5 more days twice daily with close follow-up to PCP as well as urology.  He has also had some AKI which has now down trended during this admission.  He has great urine output and will need follow-up BMP in the near future.  No other acute events noted during the course of this admission and patient is ready and stable for discharge.  Spoke with urologist Dr. Tresa Moore to confirm that patient is stable for discharge and this was affirmed.  Discharge Diagnoses:  Principal Problem:   Sepsis ([place]) Active Problems:   Prostate CA ([place])   AKI (acute kidney injury) ([place])   UTI (urinary tract infection)   Bacteremia due to Gram-negative bacteria   Sepsis, Gram negative  ([place])    Discharge Instructions  Discharge Instructions    Call MD for:  severe uncontrolled pain   Complete by:  As directed    Call MD for:  temperature >100.4   Complete by:  As directed    Diet - low sodium heart healthy   Complete by:  As directed    Increase activity slowly   Complete by:  As directed      Allergies as of 05/09/2019      Reactions   Aspirin Hives      Medication List    TAKE these medications   acetaminophen 500 MG tablet Commonly known as:  TYLENOL Take 500-1,000 mg by mouth every 6 (six) hours as needed for headache (pain).   enzalutamide 40 MG capsule Commonly known as:  Xtandi Take 4 capsules (160 mg total) by mouth daily.   oxyCODONE-acetaminophen 5-325 MG tablet Commonly known as:  PERCOCET/ROXICET Take 1 tablet by mouth every 6 (six) hours as needed for severe pain.   sulfamethoxazole-trimethoprim 800-160 MG tablet Commonly known as:  BACTRIM DS Take 1 tablet by mouth 2 (two) times daily for 5 days.      Follow-up Information    [name], MD In 1 month.   Specialty:  Urology Contact information: [place] [address] [phone]        [name], MD Follow up in 1 week(s).   Specialty:  Family Medicine Contact information: [address] [place] [address] [phone]  Allergies  Allergen Reactions  . Aspirin Hives    Consultations:  Urology  ID on phone   Procedures/Studies: Ct Abdomen Pelvis W Contrast  Result Date: 05/07/2019 CLINICAL DATA:  Evaluate hydronephrosis, metastatic prostate cancer EXAM: CT ABDOMEN AND PELVIS WITH CONTRAST TECHNIQUE: Multidetector CT imaging of the abdomen and pelvis was performed using the standard protocol following bolus administration of intravenous contrast. CONTRAST:  172mL OMNIPAQUE IOHEXOL 300 MG/ML  SOLN COMPARISON:  11/25/2018 FINDINGS: Lower chest: Small bilateral pleural effusions. Partially imaged 1.0 cm pulmonary nodule  of the right middle lobe (series 4, image 1). There are new small pulmonary nodules of the right lung, for example a 3 mm fissural nodule (series 4, image 1). Small bilateral pleural effusions and associated atelectasis or consolidation. Hepatobiliary: Multiple new and enlarged hypodense lesions of the liver, largest lesion of the right lobe measuring 2.6 cm, previously 11 mm when measured similarly (series 3, image 23). No gallstones, gallbladder wall thickening, or biliary dilatation. Pancreas: Unremarkable. No pancreatic ductal dilatation or surrounding inflammatory changes. Spleen: Normal in size without focal abnormality. Adrenals/Urinary Tract: Adrenal glands are unremarkable. There is moderate left hydronephrosis and hydroureter with a single pigtail loop type ureteral stent. Stomach/Bowel: Stomach is within normal limits. Appendix appears normal. No evidence of bowel wall thickening, distention, or inflammatory changes. Vascular/Lymphatic: Calcific atherosclerosis. No enlarged abdominal or pelvic lymph nodes. Reproductive: Status post prostatectomy. Other: No abdominal wall hernia or abnormality. No abdominopelvic ascites. Musculoskeletal: No acute or significant osseous findings. IMPRESSION: 1. There is moderate left hydronephrosis and hydroureter with a single pigtail loop type ureteral stent. No obvious explanation for failure of stent to decompress the collecting system. 2. Partially imaged 1.0 cm pulmonary nodule of the right middle lobe (series 4, image 1). There are new small pulmonary nodules of the right lung, for example a 3 mm fissural nodule (series 4, image 1). Small bilateral pleural effusions and associated atelectasis or consolidation. Multiple new and enlarged hypodense lesions of the liver, largest lesion of the right lobe measuring 2.6 cm, previously 11 mm when measured similarly (series 3, image 23). Findings are consistent with worsening metastatic disease. 3. Chronic, incidental, and  postoperative findings as detailed above. Electronically Signed   By: Eddie Candle M.D.   On: 05/07/2019 16:49   US Renal  Result Date: 05/06/2019 CLINICAL DATA:  Question ureteral obstruction. History of prostate cancer with prostate resection. EXAM: RENAL / URINARY TRACT ULTRASOUND COMPLETE COMPARISON:  Nuclear medicine renogram 01/30/2019 FINDINGS: Right Kidney: Renal measurements: 11 x 4.6 x 6.2 cm = volume: 164.1 mL . Echogenicity within normal limits. No mass or hydronephrosis visualized. Left Kidney: Renal measurements: 12 x 6.5 x 6.7 cm = volume: 270.5 mL. Left ureteral stent in place. Moderate hydronephrosis. Simple appearing cyst noted within the inferior pole of the left kidney measuring 1.5 cm. Bladder: Appears normal for degree of bladder distention. IMPRESSION: 1. Moderate left hydronephrosis status post left ureteral stenting on 02/06/2019 Electronically Signed   By: Kerby Moors M.D.   On: 05/06/2019 19:42   Dg Chest Port 1 View  Result Date: 05/06/2019 CLINICAL DATA:  Shortness of breath. EXAM: PORTABLE CHEST 1 VIEW COMPARISON:  February 03, 2018 FINDINGS: Stable right Port-A-Cath, in good position. The heart, hila, mediastinum, lungs, and pleura are otherwise unremarkable. IMPRESSION: No active disease. Electronically Signed   By: Dorise Bullion III M.D   On: 05/06/2019 17:05   Dg C-arm 1-60 Min-no Report  Result Date: 05/08/2019 Fluoroscopy was utilized by the requesting physician.  No radiographic interpretation.     Discharge Exam: Vitals:   05/08/19 2254 05/09/19 0630  BP: 109/68 130/70  Pulse: 70 62  Resp:    Temp: 97.6 F (36.4 C) (!) 97.4 F (36.3 C)  SpO2: 98% 99%   Vitals:   05/08/19 1830 05/08/19 1935 05/08/19 2254 05/09/19 0630  BP: (!) 142/72 139/79 109/68 130/70  Pulse: 79 73 70 62  Resp: 11 14    Temp: 98.4 F (36.9 C) 97.9 F (36.6 C) 97.6 F (36.4 C) (!) 97.4 F (36.3 C)  TempSrc:  Oral Oral Oral  SpO2: 93% 99% 98% 99%  Weight:      Height:         General: Pt is alert, awake, not in acute distress Cardiovascular: RRR, S1/S2 +, no rubs, no gallops Respiratory: CTA bilaterally, no wheezing, no rhonchi Abdominal: Soft, NT, ND, bowel sounds + Extremities: no edema, no cyanosis    The results of significant diagnostics from this hospitalization (including imaging, microbiology, ancillary and laboratory) are listed below for reference.     Microbiology: Recent Results (from the past 240 hour(s))  Blood culture (routine x 2)     Status: None (Preliminary result)   Collection Time: 05/06/19  4:06 PM  Result Value Ref Range Status   Specimen Description BLOOD RIGHT ANTECUBITAL  Final   Special Requests   Final    BOTTLES DRAWN AEROBIC AND ANAEROBIC Blood Culture adequate volume   Culture   Final    NO GROWTH 3 DAYS Performed at [facility], [address], [place], [place] [address]    Report Status PENDING  Incomplete  Culture, Urine     Status: Abnormal   Collection Time: 05/06/19  4:06 PM  Result Value Ref Range Status   Specimen Description URINE, RANDOM  Final   Special Requests   Final    NONE Performed at [facility], [place] [address] [place], [place] [address]    Culture (A)  Final    >=100,000 COLONIES/mL ESCHERICHIA COLI Confirmed Extended Spectrum Beta-Lactamase Producer (ESBL).  In bloodstream infections from ESBL organisms, carbapenems are preferred over piperacillin/tazobactam. They are shown to have a lower risk of mortality.    Report Status 05/09/2019 FINAL  Final   Organism ID, Bacteria ESCHERICHIA COLI (A)  Final      Susceptibility   Escherichia coli - MIC*    AMPICILLIN >=32 RESISTANT Resistant     CEFAZOLIN >=64 RESISTANT Resistant     CEFTRIAXONE >=64 RESISTANT Resistant     CIPROFLOXACIN >=4 RESISTANT Resistant     GENTAMICIN >=16 RESISTANT Resistant     IMIPENEM <=0.25 SENSITIVE Sensitive     NITROFURANTOIN <=16 SENSITIVE Sensitive     TRIMETH/SULFA <=20 SENSITIVE Sensitive      AMPICILLIN/SULBACTAM >=32 RESISTANT Resistant     PIP/TAZO <=4 SENSITIVE Sensitive     Extended ESBL POSITIVE Resistant     * >=100,000 COLONIES/mL ESCHERICHIA COLI  SARS Coronavirus 2 (CEPHEID- Performed in [facility] lab), Hosp Order     Status: None   Collection Time: 05/06/19  4:07 PM  Result Value Ref Range Status   SARS Coronavirus 2 NEGATIVE NEGATIVE Final    Comment: (NOTE) If result is NEGATIVE SARS-CoV-2 target nucleic acids are NOT DETECTED. The SARS-CoV-2 RNA is generally detectable in upper and lower  respiratory specimens during the acute phase of infection. The lowest  concentration of SARS-CoV-2 viral copies this assay can detect is 250  copies /  mL. A negative result does not preclude SARS-CoV-2 infection  and should not be used as the sole basis for treatment or other  patient management decisions.  A negative result may occur with  improper specimen collection / handling, submission of specimen other  than nasopharyngeal swab, presence of viral mutation(s) within the  areas targeted by this assay, and inadequate number of viral copies  (<250 copies / mL). A negative result must be combined with clinical  observations, patient history, and epidemiological information. If result is POSITIVE SARS-CoV-2 target nucleic acids are DETECTED. The SARS-CoV-2 RNA is generally detectable in upper and lower  respiratory specimens dur ing the acute phase of infection.  Positive  results are indicative of active infection with SARS-CoV-2.  Clinical  correlation with patient history and other diagnostic information is  necessary to determine patient infection status.  Positive results do  not rule out bacterial infection or co-infection with other viruses. If result is PRESUMPTIVE POSTIVE SARS-CoV-2 nucleic acids MAY BE PRESENT.   A presumptive positive result was obtained on the submitted specimen  and confirmed on repeat testing.  While 2019 novel coronavirus   (SARS-CoV-2) nucleic acids may be present in the submitted sample  additional confirmatory testing may be necessary for epidemiological  and / or clinical management purposes  to differentiate between  SARS-CoV-2 and other Sarbecovirus currently known to infect humans.  If clinically indicated additional testing with an alternate test  methodology [phone]) is advised. The SARS-CoV-2 RNA is generally  detectable in upper and lower respiratory sp ecimens during the acute  phase of infection. The expected result is Negative. Fact Sheet for Patients:  StrictlyIdeas.no Fact Sheet for Healthcare Providers: BankingDealers.co.za This test is not yet approved or cleared by the [place] FDA and has been authorized for detection and/or diagnosis of SARS-CoV-2 by FDA under an Emergency Use Authorization (EUA).  This EUA will remain in effect (meaning this test can be used) for the duration of the COVID-19 declaration under Section 564(b)(1) of the Act, 21 U.S.C. section 360bbb-3(b)(1), unless the authorization is terminated or revoked sooner. Performed at [facility], [place] [address] [place], [place] [address]   Blood culture (routine x 2)     Status: Abnormal   Collection Time: 05/06/19  4:50 PM  Result Value Ref Range Status   Specimen Description BLOOD LEFT ANTECUBITAL  Final   Special Requests   Final    BOTTLES DRAWN AEROBIC AND ANAEROBIC Blood Culture adequate volume   Culture  Setup Time   Final    IN BOTH AEROBIC AND ANAEROBIC BOTTLES GRAM NEGATIVE RODS CRITICAL RESULT CALLED TO, READ BACK BY AND VERIFIED WITH: PHARMD [place] S [id number] FCP Performed at [facility], [place] [address], [place], [place] [address]    Culture (A)  Final    ESCHERICHIA COLI Confirmed Extended Spectrum Beta-Lactamase Producer (ESBL).  In bloodstream infections from ESBL organisms, carbapenems are preferred over piperacillin/tazobactam.  They are shown to have a lower risk of mortality.    Report Status 05/09/2019 FINAL  Final   Organism ID, Bacteria ESCHERICHIA COLI  Final      Susceptibility   Escherichia coli - MIC*    AMPICILLIN >=32 RESISTANT Resistant     CEFAZOLIN >=64 RESISTANT Resistant     CEFEPIME RESISTANT Resistant     CEFTAZIDIME RESISTANT Resistant     CEFTRIAXONE >=64 RESISTANT Resistant     CIPROFLOXACIN >=4 RESISTANT Resistant     GENTAMICIN >=16 RESISTANT  Resistant     IMIPENEM <=0.25 SENSITIVE Sensitive     TRIMETH/SULFA <=20 SENSITIVE Sensitive     AMPICILLIN/SULBACTAM >=32 RESISTANT Resistant     PIP/TAZO <=4 SENSITIVE Sensitive     Extended ESBL POSITIVE Resistant     * ESCHERICHIA COLI  Blood Culture ID Panel (Reflexed)     Status: Abnormal   Collection Time: 05/06/19  4:50 PM  Result Value Ref Range Status   Enterococcus species NOT DETECTED NOT DETECTED Final   Listeria monocytogenes NOT DETECTED NOT DETECTED Final   Staphylococcus species NOT DETECTED NOT DETECTED Final   Staphylococcus aureus (BCID) NOT DETECTED NOT DETECTED Final   Streptococcus species NOT DETECTED NOT DETECTED Final   Streptococcus agalactiae NOT DETECTED NOT DETECTED Final   Streptococcus pneumoniae NOT DETECTED NOT DETECTED Final   Streptococcus pyogenes NOT DETECTED NOT DETECTED Final   Acinetobacter baumannii NOT DETECTED NOT DETECTED Final   Enterobacteriaceae species DETECTED (A) NOT DETECTED Final    Comment: Enterobacteriaceae represent a large family of gram-negative bacteria, not a single organism. CRITICAL RESULT CALLED TO, READ BACK BY AND VERIFIED WITH: PHARMD [place] S [phone] FCP    Enterobacter cloacae complex NOT DETECTED NOT DETECTED Final   Escherichia coli DETECTED (A) NOT DETECTED Final    Comment: CRITICAL RESULT CALLED TO, READ BACK BY AND VERIFIED WITH: PHARMD [place]BE[place] S [phone] FCP    Klebsiella oxytoca NOT DETECTED NOT DETECTED Final   Klebsiella pneumoniae NOT DETECTED NOT  DETECTED Final   Proteus species NOT DETECTED NOT DETECTED Final   Serratia marcescens NOT DETECTED NOT DETECTED Final   Carbapenem resistance NOT DETECTED NOT DETECTED Final   Haemophilus influenzae NOT DETECTED NOT DETECTED Final   Neisseria meningitidis NOT DETECTED NOT DETECTED Final   Pseudomonas aeruginosa NOT DETECTED NOT DETECTED Final   Candida albicans NOT DETECTED NOT DETECTED Final   Candida glabrata NOT DETECTED NOT DETECTED Final   Candida krusei NOT DETECTED NOT DETECTED Final   Candida parapsilosis NOT DETECTED NOT DETECTED Final   Candida tropicalis NOT DETECTED NOT DETECTED Final    Comment: Performed at [facility], [place] [address] [place], [place] [address]  Surgical PCR screen     Status: None   Collection Time: 05/08/19 12:59 PM  Result Value Ref Range Status   MRSA, PCR NEGATIVE NEGATIVE Final   Staphylococcus aureus NEGATIVE NEGATIVE Final    Comment: (NOTE) The Xpert SA Assay (FDA approved for NASAL specimens in patients 92 years of age and older), is one component of a comprehensive surveillance program. It is not intended to diagnose infection nor to guide or monitor treatment. Performed at [facility], [place] [address], [place], [place] [address]      Labs: BNP (last 3 results) No results for input(s): BNP in the last 8760 hours. Basic Metabolic Panel: Recent Labs  Lab 05/06/19 1607 05/07/19 0323 05/08/19 0252  NA 133* 138 136  K 3.1* 4.2 3.6  CL 102 105 102  CO2 21* 24 22  GLUCOSE 129* 128* 119*  BUN 15 14 13   CREATININE 1.34* 1.29* 1.23  CALCIUM 8.2* 8.5* 8.6*   Liver Function Tests: Recent Labs  Lab 05/06/19 1607 05/08/19 0252  AST 19 16  ALT 22 21  ALKPHOS 112 131*  BILITOT 1.6* 1.0  PROT 6.7 6.5  ALBUMIN 2.9* 2.5*   No results for input(s): LIPASE, AMYLASE in the last 168 hours. No results for input(s): AMMONIA in the last 168 hours. CBC:  Recent Labs  Lab 05/06/19 1607 05/07/19 0323 05/08/19 0252  WBC 8.0  5.6 6.4  NEUTROABS 6.9  --  5.3  HGB 10.1* 9.0* 9.3*  HCT 30.2* 27.3* 28.0*  MCV 80.1 79.8* 79.8*  PLT 176 174 203   Cardiac Enzymes: No results for input(s): CKTOTAL, CKMB, CKMBINDEX, TROPONINI in the last 168 hours. BNP: Invalid input(s): POCBNP CBG: Recent Labs  Lab 05/08/19 2255  GLUCAP 179*   D-Dimer No results for input(s): DDIMER in the last 72 hours. Hgb A1c Recent Labs    05/08/19 0252  HGBA1C 5.3   Lipid Profile No results for input(s): CHOL, HDL, LDLCALC, TRIG, CHOLHDL, LDLDIRECT in the last 72 hours. Thyroid function studies No results for input(s): TSH, T4TOTAL, T3FREE, THYROIDAB in the last 72 hours.  Invalid input(s): FREET3 Anemia work up No results for input(s): VITAMINB12, FOLATE, FERRITIN, TIBC, IRON, RETICCTPCT in the last 72 hours. Urinalysis    Component Value Date/Time   COLORURINE AMBER (A) 05/06/2019 1607   APPEARANCEUR CLOUDY (A) 05/06/2019 1607   LABSPEC 1.021 05/06/2019 1607   LABSPEC 1.010 07/18/2015 1052   PHURINE 5.0 05/06/2019 1607   GLUCOSEU NEGATIVE 05/06/2019 1607   GLUCOSEU Negative 07/18/2015 1052   HGBUR LARGE (A) 05/06/2019 1607   BILIRUBINUR NEGATIVE 05/06/2019 1607   BILIRUBINUR Negative 07/18/2015 1052   KETONESUR NEGATIVE 05/06/2019 1607   PROTEINUR 100 (A) 05/06/2019 1607   UROBILINOGEN 0.2 07/18/2015 1052   NITRITE NEGATIVE 05/06/2019 1607   LEUKOCYTESUR MODERATE (A) 05/06/2019 1607   LEUKOCYTESUR Negative 07/18/2015 1052   Sepsis Labs Invalid input(s): PROCALCITONIN,  WBC,  LACTICIDVEN Microbiology Recent Results (from the past 240 hour(s))  Blood culture (routine x 2)     Status: None (Preliminary result)   Collection Time: 05/06/19  4:06 PM  Result Value Ref Range Status   Specimen Description BLOOD RIGHT ANTECUBITAL  Final   Special Requests   Final    BOTTLES DRAWN AEROBIC AND ANAEROBIC Blood Culture adequate volume   Culture   Final    NO GROWTH 3 DAYS Performed at [facility], [place] [address] [place], [place] [address]    Report Status PENDING  Incomplete  Culture, Urine     Status: Abnormal   Collection Time: 05/06/19  4:06 PM  Result Value Ref Range Status   Specimen Description URINE, RANDOM  Final   Special Requests   Final    NONE Performed at [facility], [place] [address] [place], [place] [address]    Culture (A)  Final    >=100,000 COLONIES/mL ESCHERICHIA COLI Confirmed Extended Spectrum Beta-Lactamase Producer (ESBL).  In bloodstream infections from ESBL organisms, carbapenems are preferred over piperacillin/tazobactam. They are shown to have a lower risk of mortality.    Report Status 05/09/2019 FINAL  Final   Organism ID, Bacteria ESCHERICHIA COLI (A)  Final      Susceptibility   Escherichia coli - MIC*    AMPICILLIN >=32 RESISTANT Resistant     CEFAZOLIN >=64 RESISTANT Resistant     CEFTRIAXONE >=64 RESISTANT Resistant     CIPROFLOXACIN >=4 RESISTANT Resistant     GENTAMICIN >=16 RESISTANT Resistant     IMIPENEM <=0.25 SENSITIVE Sensitive     NITROFURANTOIN <=16 SENSITIVE Sensitive     TRIMETH/SULFA <=20 SENSITIVE Sensitive     AMPICILLIN/SULBACTAM >=32 RESISTANT Resistant     PIP/TAZO <=4 SENSITIVE Sensitive     Extended ESBL POSITIVE Resistant     * >=100,000 COLONIES/mL ESCHERICHIA COLI  SARS  Coronavirus 2 (CEPHEID- Performed in [facility] hospital lab), Hosp Order     Status: None   Collection Time: 05/06/19  4:07 PM  Result Value Ref Range Status   SARS Coronavirus 2 NEGATIVE NEGATIVE Final    Comment: (NOTE) If result is NEGATIVE SARS-CoV-2 target nucleic acids are NOT DETECTED. The SARS-CoV-2 RNA is generally detectable in upper and lower  respiratory specimens during the acute phase of infection. The lowest  concentration of SARS-CoV-2 viral copies this assay can detect is 250  copies / mL. A negative result does not preclude SARS-CoV-2 infection  and should not be used as the sole basis for treatment or other  patient management  decisions.  A negative result may occur with  improper specimen collection / handling, submission of specimen other  than nasopharyngeal swab, presence of viral mutation(s) within the  areas targeted by this assay, and inadequate number of viral copies  (<250 copies / mL). A negative result must be combined with clinical  observations, patient history, and epidemiological information. If result is POSITIVE SARS-CoV-2 target nucleic acids are DETECTED. The SARS-CoV-2 RNA is generally detectable in upper and lower  respiratory specimens dur ing the acute phase of infection.  Positive  results are indicative of active infection with SARS-CoV-2.  Clinical  correlation with patient history and other diagnostic information is  necessary to determine patient infection status.  Positive results do  not rule out bacterial infection or co-infection with other viruses. If result is PRESUMPTIVE POSTIVE SARS-CoV-2 nucleic acids MAY BE PRESENT.   A presumptive positive result was obtained on the submitted specimen  and confirmed on repeat testing.  While 2019 novel coronavirus  (SARS-CoV-2) nucleic acids may be present in the submitted sample  additional confirmatory testing may be necessary for epidemiological  and / or clinical management purposes  to differentiate between  SARS-CoV-2 and other Sarbecovirus currently known to infect humans.  If clinically indicated additional testing with an alternate test  methodology [phone]) is advised. The SARS-CoV-2 RNA is generally  detectable in upper and lower respiratory sp ecimens during the acute  phase of infection. The expected result is Negative. Fact Sheet for Patients:  StrictlyIdeas.no Fact Sheet for Healthcare Providers: BankingDealers.co.za This test is not yet approved or cleared by the [place] FDA and has been authorized for detection and/or diagnosis of SARS-CoV-2 by FDA under an  Emergency Use Authorization (EUA).  This EUA will remain in effect (meaning this test can be used) for the duration of the COVID-19 declaration under Section 564(b)(1) of the Act, 21 U.S.C. section 360bbb-3(b)(1), unless the authorization is terminated or revoked sooner. Performed at [facility], [place] [address], [place], [place] [address]   Blood culture (routine x 2)     Status: Abnormal   Collection Time: 05/06/19  4:50 PM  Result Value Ref Range Status   Specimen Description BLOOD LEFT ANTECUBITAL  Final   Special Requests   Final    BOTTLES DRAWN AEROBIC AND ANAEROBIC Blood Culture adequate volume   Culture  Setup Time   Final    IN BOTH AEROBIC AND ANAEROBIC BOTTLES GRAM NEGATIVE RODS CRITICAL RESULT CALLED TO, READ BACK BY AND VERIFIED WITH: PHARMD [place] S [id number] FCP Performed at [facility], [place] [address] [place],  [address]    Culture (A)  Final    ESCHERICHIA COLI Confirmed Extended Spectrum Beta-Lactamase Producer (ESBL).  In bloodstream infections from ESBL organisms, carbapenems are preferred over piperacillin/tazobactam. They  are shown to have a lower risk of mortality.    Report Status 05/09/2019 FINAL  Final   Organism ID, Bacteria ESCHERICHIA COLI  Final      Susceptibility   Escherichia coli - MIC*    AMPICILLIN >=32 RESISTANT Resistant     CEFAZOLIN >=64 RESISTANT Resistant     CEFEPIME RESISTANT Resistant     CEFTAZIDIME RESISTANT Resistant     CEFTRIAXONE >=64 RESISTANT Resistant     CIPROFLOXACIN >=4 RESISTANT Resistant     GENTAMICIN >=16 RESISTANT Resistant     IMIPENEM <=0.25 SENSITIVE Sensitive     TRIMETH/SULFA <=20 SENSITIVE Sensitive     AMPICILLIN/SULBACTAM >=32 RESISTANT Resistant     PIP/TAZO <=4 SENSITIVE Sensitive     Extended ESBL POSITIVE Resistant     * ESCHERICHIA COLI  Blood Culture ID Panel (Reflexed)     Status: Abnormal   Collection Time: 05/06/19  4:50 PM  Result Value Ref Range Status   Enterococcus  species NOT DETECTED NOT DETECTED Final   Listeria monocytogenes NOT DETECTED NOT DETECTED Final   Staphylococcus species NOT DETECTED NOT DETECTED Final   Staphylococcus aureus (BCID) NOT DETECTED NOT DETECTED Final   Streptococcus species NOT DETECTED NOT DETECTED Final   Streptococcus agalactiae NOT DETECTED NOT DETECTED Final   Streptococcus pneumoniae NOT DETECTED NOT DETECTED Final   Streptococcus pyogenes NOT DETECTED NOT DETECTED Final   Acinetobacter baumannii NOT DETECTED NOT DETECTED Final   Enterobacteriaceae species DETECTED (A) NOT DETECTED Final    Comment: Enterobacteriaceae represent a large family of gram-negative bacteria, not a single organism. CRITICAL RESULT CALLED TO, READ BACK BY AND VERIFIED WITH: PHARMD Swartz S [phone] FCP    Enterobacter cloacae complex NOT DETECTED NOT DETECTED Final   Escherichia coli DETECTED (A) NOT DETECTED Final    Comment: CRITICAL RESULT CALLED TO, READ BACK BY AND VERIFIED WITH: PHARMD REBECCA S [phone] FCP    Klebsiella oxytoca NOT DETECTED NOT DETECTED Final   Klebsiella pneumoniae NOT DETECTED NOT DETECTED Final   Proteus species NOT DETECTED NOT DETECTED Final   Serratia marcescens NOT DETECTED NOT DETECTED Final   Carbapenem resistance NOT DETECTED NOT DETECTED Final   Haemophilus influenzae NOT DETECTED NOT DETECTED Final   Neisseria meningitidis NOT DETECTED NOT DETECTED Final   Pseudomonas aeruginosa NOT DETECTED NOT DETECTED Final   Candida albicans NOT DETECTED NOT DETECTED Final   Candida glabrata NOT DETECTED NOT DETECTED Final   Candida krusei NOT DETECTED NOT DETECTED Final   Candida parapsilosis NOT DETECTED NOT DETECTED Final   Candida tropicalis NOT DETECTED NOT DETECTED Final    Comment: Performed at [facility], [place] [address], [place], [place] [address]  Surgical PCR screen     Status: None   Collection Time: 05/08/19 12:59 PM  Result Value Ref Range Status   MRSA, PCR NEGATIVE NEGATIVE Final    Staphylococcus aureus NEGATIVE NEGATIVE Final    Comment: (NOTE) The Xpert SA Assay (FDA approved for NASAL specimens in patients 29 years of age and older), is one component of a comprehensive surveillance program. It is not intended to diagnose infection nor to guide or monitor treatment. Performed at [facility], [place] [address], [place], [place] [address]      Time coordinating discharge: 35 minutes  SIGNED:   Rodena Goldmann, DO [facility] 05/09/2019, 1:59 PM  If 7PM-7AM, please contact night-coverage [email or web address] Password [email or web address]

## 2019-05-09 NOTE — Progress Notes (Signed)
Pharmacy Antibiotic Note  [name] Johnston is a 68 y.o. male admitted on 05/06/2019 with fever/chills and found to have ESBL E.coli bacteremia. Pharmacy has been consulted to transition Ceftriaxone to Meropenem.   Plan: - D/c Rocephin - Start Meropenem 1g IV every 12 hours - Given the patient's improvement, Bactrim po might be an option - would consider discussing with ID - Will continue to follow renal function, culture results, LOT, and antibiotic de-escalation plans   Height: 5\' 2"  (157.5 cm) Weight: 130 lb 11.7 oz (59.3 kg) IBW/kg (Calculated) : 54.6  Temp (24hrs), Avg:98.1 F (36.7 C), Min:97.4 F (36.3 C), Max:99.3 F (37.4 C)  Recent Labs  Lab 05/06/19 1607 05/06/19 1625 05/07/19 0323 05/08/19 0252  WBC 8.0  --  5.6 6.4  CREATININE 1.34*  --  1.29* 1.23  LATICACIDVEN  --  1.2  --   --     Estimated Creatinine Clearance: 43.8 mL/min (by C-G formula based on SCr of 1.23 mg/dL).    Allergies  Allergen Reactions  . Aspirin Hives    Antimicrobials this admission: Ceepime 5/16 x 1 CTX 5/17 >> 5/19 Meropenem 5/19 >>  Microbiology results: 5/16 COVID >> neg 5/16 BCx >> 1/2 ESBL E.coli (S-Imi, Bactrim) 5/16 UCx >> ESBL E.coli   Thank you for allowing pharmacy to be a part of this patient's care.  Alycia Rossetti, PharmD, BCPS Clinical Pharmacist Clinical phone for 05/09/2019: [phone] 05/09/2019 11:49 AM   **Pharmacist phone directory can now be found on [email or web address] (PW [email or web address]).  Listed under [place].

## 2019-05-11 LAB — CULTURE, BLOOD (ROUTINE X 2)
Culture: NO GROWTH
Special Requests: ADEQUATE

## 2019-05-19 ENCOUNTER — Inpatient Hospital Stay: Payer: PPO

## 2019-05-19 ENCOUNTER — Inpatient Hospital Stay: Payer: PPO | Attending: Oncology

## 2019-05-19 ENCOUNTER — Inpatient Hospital Stay (HOSPITAL_BASED_OUTPATIENT_CLINIC_OR_DEPARTMENT_OTHER): Payer: PPO | Admitting: Medical

## 2019-05-19 ENCOUNTER — Other Ambulatory Visit: Payer: Self-pay

## 2019-05-19 VITALS — BP 130/79 | HR 73 | Temp 98.3°F | Resp 18

## 2019-05-19 DIAGNOSIS — Z9079 Acquired absence of other genital organ(s): Secondary | ICD-10-CM | POA: Insufficient documentation

## 2019-05-19 DIAGNOSIS — N390 Urinary tract infection, site not specified: Secondary | ICD-10-CM | POA: Insufficient documentation

## 2019-05-19 DIAGNOSIS — Z79899 Other long term (current) drug therapy: Secondary | ICD-10-CM | POA: Diagnosis not present

## 2019-05-19 DIAGNOSIS — I10 Essential (primary) hypertension: Secondary | ICD-10-CM | POA: Insufficient documentation

## 2019-05-19 DIAGNOSIS — C61 Malignant neoplasm of prostate: Secondary | ICD-10-CM

## 2019-05-19 DIAGNOSIS — Z95828 Presence of other vascular implants and grafts: Secondary | ICD-10-CM

## 2019-05-19 DIAGNOSIS — R140 Abdominal distension (gaseous): Secondary | ICD-10-CM

## 2019-05-19 DIAGNOSIS — C787 Secondary malignant neoplasm of liver and intrahepatic bile duct: Secondary | ICD-10-CM | POA: Diagnosis not present

## 2019-05-19 DIAGNOSIS — B9620 Unspecified Escherichia coli [E. coli] as the cause of diseases classified elsewhere: Secondary | ICD-10-CM | POA: Diagnosis not present

## 2019-05-19 LAB — CMP (CANCER CENTER ONLY)
ALT: 14 U/L (ref 0–44)
AST: 13 U/L — ABNORMAL LOW (ref 15–41)
Albumin: 3.3 g/dL — ABNORMAL LOW (ref 3.5–5.0)
Alkaline Phosphatase: 118 U/L (ref 38–126)
Anion gap: 10 (ref 5–15)
BUN: 17 mg/dL (ref 8–23)
CO2: 26 mmol/L (ref 22–32)
Calcium: 9 mg/dL (ref 8.9–10.3)
Chloride: 102 mmol/L (ref 98–111)
Creatinine: 0.99 mg/dL (ref 0.61–1.24)
GFR, Est AFR Am: >60 mL/min (ref >60)
GFR, Estimated: >60 mL/min (ref >60)
Glucose, Bld: 115 mg/dL — ABNORMAL HIGH (ref 70–99)
Potassium: 3.7 mmol/L (ref 3.5–5.1)
Sodium: 138 mmol/L (ref 135–145)
Total Bilirubin: 0.3 mg/dL (ref 0.3–1.2)
Total Protein: 7.8 g/dL (ref 6.5–8.1)

## 2019-05-19 LAB — CBC WITH DIFFERENTIAL (CANCER CENTER ONLY)
Abs Immature Granulocytes: 0.01 10*3/uL (ref 0.00–0.07)
Basophils Absolute: 0.1 10*3/uL (ref 0.0–0.1)
Basophils Relative: 1 %
Eosinophils Absolute: 0.2 10*3/uL (ref 0.0–0.5)
Eosinophils Relative: 3 %
HCT: 33.6 % — ABNORMAL LOW (ref 39.0–52.0)
Hemoglobin: 10.8 g/dL — ABNORMAL LOW (ref 13.0–17.0)
Immature Granulocytes: 0 %
Lymphocytes Relative: 21 %
Lymphs Abs: 1.1 10*3/uL (ref 0.7–4.0)
MCH: 26.8 pg (ref 26.0–34.0)
MCHC: 32.1 g/dL (ref 30.0–36.0)
MCV: 83.4 fL (ref 80.0–100.0)
Monocytes Absolute: 0.4 10*3/uL (ref 0.1–1.0)
Monocytes Relative: 7 %
Neutro Abs: 3.7 10*3/uL (ref 1.7–7.7)
Neutrophils Relative %: 68 %
Platelet Count: 331 10*3/uL (ref 150–400)
RBC: 4.03 MIL/uL — ABNORMAL LOW (ref 4.22–5.81)
RDW: 14.3 % (ref 11.5–15.5)
WBC Count: 5.4 10*3/uL (ref 4.0–10.5)
nRBC: 0 % (ref 0.0–0.2)

## 2019-05-19 MED ORDER — SODIUM CHLORIDE 0.9% FLUSH
10.0000 mL | Freq: Once | INTRAVENOUS | Status: AC
  Administered 2019-05-19: 10 mL
  Filled 2019-05-19: qty 10

## 2019-05-19 MED ORDER — HEPARIN SOD (PORK) LOCK FLUSH 100 UNIT/ML IV SOLN
500.0000 [IU] | Freq: Once | INTRAVENOUS | Status: AC
  Administered 2019-05-19: 500 [IU]
  Filled 2019-05-19: qty 5

## 2019-05-19 MED ORDER — SIMETHICONE 125 MG PO CHEW: CHEWABLE_TABLET | ORAL | 2 refills | Status: DC

## 2019-05-19 NOTE — Progress Notes (Signed)
Interpreter present for RN & PA assessment today.

## 2019-05-19 NOTE — Progress Notes (Signed)
Pt arrived today for routine lab work and port flush. Pt was very weak during procedure. Talked to Sandi Mealy in [facility] and transported pt to room 25

## 2019-05-19 NOTE — Patient Instructions (Signed)

## 2019-05-20 LAB — PROSTATE-SPECIFIC AG, SERUM (LABCORP): Prostate Specific Ag, Serum: 39.6 ng/mL — ABNORMAL HIGH (ref 0.0–4.0)
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
## 2019-05-23 ENCOUNTER — Other Ambulatory Visit: Payer: Self-pay

## 2019-05-23 ENCOUNTER — Inpatient Hospital Stay: Payer: PPO | Attending: Oncology | Admitting: Oncology

## 2019-05-23 VITALS — BP 140/85 | HR 68 | Temp 98.2°F | Resp 17 | Ht 62.0 in | Wt 126.8 lb

## 2019-05-23 DIAGNOSIS — C61 Malignant neoplasm of prostate: Secondary | ICD-10-CM

## 2019-05-23 DIAGNOSIS — C787 Secondary malignant neoplasm of liver and intrahepatic bile duct: Secondary | ICD-10-CM | POA: Insufficient documentation

## 2019-05-23 DIAGNOSIS — D649 Anemia, unspecified: Secondary | ICD-10-CM | POA: Diagnosis not present

## 2019-05-23 MED ORDER — ENZALUTAMIDE 40 MG PO CAPS: 160.0000 mg | ORAL_CAPSULE | Freq: Every day | ORAL | 0 refills | Status: DC

## 2019-05-23 NOTE — Progress Notes (Signed)
Hematology and Oncology Follow Up Visit  [name] Johnston [id number] 1951-07-30 68 y.o. 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  He presented with Gleason score of 8 and a PSA of 40.   Prior Therapy:  He is started [place] on 06/27/2013. He subsequently received definitive radiation therapy for a planned dose of 75 gray completed in November 2014 under the care of Dr. Tammi Johnston.   He developed castration resistant disease with biopsy-proven liver metastasis in May 2016.  Docetaxel 75 mg/m given every 3 weeks with Neulasta support. He is status post 10 cycles of treatment concluded on 11/21/2015. He had an excellent PSA response with PSA dropping from 222 to 0.17.   PSA started to rise again with PSA up to 8.9 in August 2017.  He is status post orchiectomy completed in February 2019.  Zytiga 1000 mg with prednisone since September 2017.   Jevtana chemotherapy given at 20 mg/m every 3 weeks cycle 1 started on 07/29/2018.  He completed 6 cycles of therapy in November 2019.  Therapy discontinued because of patient's preference.  Current therapy: Xtandi 160 mg daily started in May 2020.  Interim History:  [name] returns today for a follow-up.  Since the last visit, he was hospitalized for urinary sepsis in May 2020.  During his hospitalization he underwent cystoscopy and a left JJ stent change.  Since his discharge, he reports increased fatigue and tiredness and lack of appetite.  His performance status continues to decline in his overall quality of life is poor.  He is ambulating without any major difficulties but he is more fatigued.  He denies any worsening abdominal pain but does report some indigestion after meals.   Patient denied headaches, blurry vision, syncope or seizures.  Denies any fevers, chills or sweats.  Denied chest pain, palpitation,  orthopnea or leg edema.  Denied cough, wheezing or hemoptysis.  Denied nausea, vomiting or abdominal pain.  Denies any constipation or diarrhea.  Denies any frequency urgency or hesitancy.  Denies any arthralgias or myalgias.  Denies any skin rashes or lesions.  Denies any bleeding or clotting tendency.  Denies any easy bruising.  Denies any hair or nail changes.  Denies any anxiety or depression.  Remaining review of system is negative.      Medications: I have reviewed the patient's current medications.   Current Outpatient Medications  Medication Sig Dispense Refill  . enzalutamide (XTANDI) 40 MG capsule Take 4 capsules (160 mg total) by mouth daily. 120 capsule 0  . simethicone (MYLICON) 779 MG chewable tablet 1 to 2 tablets every 6 hours as needed for bloating 60 tablet 2   No current facility-administered medications for this visit.    Facility-Administered Medications Ordered in Other Visits  Medication Dose Route Frequency Provider Last Rate Last Dose  . heparin lock flush 100 unit/mL  500 Units Intracatheter Once [name] Portela, MD      . sodium chloride flush (NS) 0.9 % injection 10 mL  10 mL Intracatheter Once [name] Portela, MD      . sodium chloride flush (NS) 0.9 % injection 10 mL  10 mL Intracatheter PRN [name] Portela, MD   10 mL at 09/30/18 1236     Allergies:  Allergies  Allergen Reactions  . Aspirin Hives    Past Medical History, Surgical history, Social history, and Family History were reviewed and  updated.    Physical Exam:    Blood pressure 140/85, pulse 68, temperature 98.2 F (36.8 C), temperature source Oral, resp. rate 17, height 5\' 2"  (1.575 m), weight 126 lb 12.8 oz (57.5 kg), SpO2 98 %.      ECOG: 1   General appearance: Alert, awake without any distress. Head: Atraumatic without abnormalities Oropharynx: Without any thrush or ulcers. Eyes: No scleral icterus. Lymph nodes: No lymphadenopathy noted in the cervical, supraclavicular, or  axillary nodes Heart:regular rate and rhythm, without any murmurs or gallops.   Lung: Clear to auscultation without any rhonchi, wheezes or dullness to percussion. Abdomin: Soft, nontender without any shifting dullness or ascites. Musculoskeletal: No clubbing or cyanosis. Neurological: No motor or sensory deficits. Skin: No rashes or lesions.     Lab Results: Lab Results  Component Value Date   WBC 5.4 05/19/2019   HGB 10.8 (L) 05/19/2019   HCT 33.6 (L) 05/19/2019   MCV 83.4 05/19/2019   PLT 331 05/19/2019     Chemistry      Component Value Date/Time   NA 138 05/19/2019 1050   NA 142 12/17/2017 1303   K 3.7 05/19/2019 1050   K 3.2 (L) 12/17/2017 1303   CL 102 05/19/2019 1050   CO2 26 05/19/2019 1050   CO2 28 12/17/2017 1303   BUN 17 05/19/2019 1050   BUN 14.0 12/17/2017 1303   CREATININE 0.99 05/19/2019 1050   CREATININE 1.1 12/17/2017 1303      Component Value Date/Time   CALCIUM 9.0 05/19/2019 1050   CALCIUM 9.0 12/17/2017 1303   ALKPHOS 118 05/19/2019 1050   ALKPHOS 85 12/17/2017 1303   AST 13 (L) 05/19/2019 1050   AST 12 12/17/2017 1303   ALT 14 05/19/2019 1050   ALT 15 12/17/2017 1303   BILITOT 0.3 05/19/2019 1050   BILITOT 0.41 12/17/2017 1303       Results for [name] Johnston, [name] Johnston (MRN [id number]) as of 05/23/2019 10:29  Ref. Range 04/14/2019 10:50 05/19/2019 10:50  Prostate Specific Ag, Serum Latest Ref Range: 0.0 - 4.0 ng/mL 26.0 (H) 39.6 (H)     Impression and Plan:  68 year old man with:  1.  Castration resistant prostate cancer with disease to the liver.  He has progressed on therapy as outlined above.  He has taken Xtandi for 1 month without any changes in his PSA.  CT scan on May 07, 2019 showed progression of disease.  Risks and benefits of continuing this therapy versus discontinuation were reviewed.  Alternative options would be systemic chemotherapy versus hospice.  At this time he is willing to continue with this treatment for another month.  We will  reevaluate the effect in 1 month.   2.  Hepatic metastasis:-CT scan obtained on May 07, 2019 showed progression of disease.  He is asymptomatic from these findings.  3.  IV access: Port-A-Cath will continue to be flushed periodically.  4.  Left ureteral obstruction: He status post stent replacement in May 2020.  5. Prognosis: His prognosis is poor with anticipated limited life expectancy.  His performance status remains adequate and would like to continue with aggressive therapy at least for the time being.  6.  Anemia: His hemoglobin continues to improve at this time without any intervention.  7. Follow-up: Will be in 4 weeks for repeat evaluation.  25  minutes was spent with the patient face-to-face today.  More than 50% of time was spent on reviewing his disease status, treatment alternatives, reviewing imaging studies and answering questions  regarding future plan of care.    Zola Button, MD 6/2/202010:27 AM

## 2019-05-24 ENCOUNTER — Telehealth: Payer: Self-pay | Admitting: Oncology

## 2019-05-24 NOTE — Telephone Encounter (Signed)
Scheduled appt per sch msg. Mailed printout  °

## 2019-05-27 ENCOUNTER — Encounter: Payer: Self-pay | Admitting: Oncology

## 2019-06-30 ENCOUNTER — Inpatient Hospital Stay: Payer: PPO | Attending: Oncology

## 2019-06-30 ENCOUNTER — Other Ambulatory Visit: Payer: Self-pay

## 2019-06-30 ENCOUNTER — Inpatient Hospital Stay: Payer: PPO

## 2019-06-30 DIAGNOSIS — Z95828 Presence of other vascular implants and grafts: Secondary | ICD-10-CM

## 2019-06-30 DIAGNOSIS — C787 Secondary malignant neoplasm of liver and intrahepatic bile duct: Secondary | ICD-10-CM | POA: Diagnosis not present

## 2019-06-30 DIAGNOSIS — C61 Malignant neoplasm of prostate: Secondary | ICD-10-CM

## 2019-06-30 LAB — CMP (CANCER CENTER ONLY)
ALT: 11 U/L (ref 0–44)
AST: 15 U/L (ref 15–41)
Albumin: 3.8 g/dL (ref 3.5–5.0)
Alkaline Phosphatase: 118 U/L (ref 38–126)
Anion gap: 9 (ref 5–15)
BUN: 11 mg/dL (ref 8–23)
CO2: 26 mmol/L (ref 22–32)
Calcium: 8.7 mg/dL — ABNORMAL LOW (ref 8.9–10.3)
Chloride: 107 mmol/L (ref 98–111)
Creatinine: 0.94 mg/dL (ref 0.61–1.24)
GFR, Est AFR Am: >60 mL/min (ref >60)
GFR, Estimated: >60 mL/min (ref >60)
Glucose, Bld: 104 mg/dL — ABNORMAL HIGH (ref 70–99)
Potassium: 3.7 mmol/L (ref 3.5–5.1)
Sodium: 142 mmol/L (ref 135–145)
Total Bilirubin: 0.5 mg/dL (ref 0.3–1.2)
Total Protein: 7.3 g/dL (ref 6.5–8.1)

## 2019-06-30 LAB — CBC WITH DIFFERENTIAL (CANCER CENTER ONLY)
Abs Immature Granulocytes: 0 10*3/uL (ref 0.00–0.07)
Basophils Absolute: 0 10*3/uL (ref 0.0–0.1)
Basophils Relative: 1 %
Eosinophils Absolute: 0.2 10*3/uL (ref 0.0–0.5)
Eosinophils Relative: 5 %
HCT: 35.1 % — ABNORMAL LOW (ref 39.0–52.0)
Hemoglobin: 11.7 g/dL — ABNORMAL LOW (ref 13.0–17.0)
Immature Granulocytes: 0 %
Lymphocytes Relative: 24 %
Lymphs Abs: 0.9 10*3/uL (ref 0.7–4.0)
MCH: 26.8 pg (ref 26.0–34.0)
MCHC: 33.3 g/dL (ref 30.0–36.0)
MCV: 80.5 fL (ref 80.0–100.0)
Monocytes Absolute: 0.3 10*3/uL (ref 0.1–1.0)
Monocytes Relative: 7 %
Neutro Abs: 2.4 10*3/uL (ref 1.7–7.7)
Neutrophils Relative %: 63 %
Platelet Count: 187 10*3/uL (ref 150–400)
RBC: 4.36 MIL/uL (ref 4.22–5.81)
RDW: 13.3 % (ref 11.5–15.5)
WBC Count: 3.8 10*3/uL — ABNORMAL LOW (ref 4.0–10.5)
nRBC: 0 % (ref 0.0–0.2)

## 2019-06-30 MED ORDER — SODIUM CHLORIDE 0.9% FLUSH
10.0000 mL | Freq: Once | INTRAVENOUS | Status: AC
  Administered 2019-06-30: 10 mL
  Filled 2019-06-30: qty 10

## 2019-06-30 MED ORDER — HEPARIN SOD (PORK) LOCK FLUSH 100 UNIT/ML IV SOLN
500.0000 [IU] | Freq: Once | INTRAVENOUS | Status: AC
  Administered 2019-06-30: 10:00:00 500 [IU]
  Filled 2019-06-30: qty 5

## 2019-07-01 LAB — PROSTATE-SPECIFIC AG, SERUM (LABCORP): Prostate Specific Ag, Serum: 82.3 ng/mL — ABNORMAL HIGH (ref 0.0–4.0)

## 2019-07-04 ENCOUNTER — Telehealth: Payer: Self-pay

## 2019-07-04 ENCOUNTER — Other Ambulatory Visit: Payer: Self-pay

## 2019-07-04 ENCOUNTER — Inpatient Hospital Stay (HOSPITAL_BASED_OUTPATIENT_CLINIC_OR_DEPARTMENT_OTHER): Payer: PPO | Admitting: Oncology

## 2019-07-04 VITALS — BP 138/84 | HR 73 | Temp 99.1°F | Resp 17 | Ht 62.0 in | Wt 128.1 lb

## 2019-07-04 DIAGNOSIS — C61 Malignant neoplasm of prostate: Secondary | ICD-10-CM

## 2019-07-04 DIAGNOSIS — C787 Secondary malignant neoplasm of liver and intrahepatic bile duct: Secondary | ICD-10-CM

## 2019-07-04 NOTE — Progress Notes (Signed)
Hematology and Oncology Follow Up Visit  [name] Johnston [id number] [birth year] 68 y.o. 07/04/2019 12:24 PM [name] Johnston, MDDewey, [name], MD   Principle Diagnosis: 68 year old man with advanced prostate cancer with disease to the liver diagnosed in 2014.  He has castration-resistant disease after presenting with a Gleason score of 8 and a PSA of 14.  Prior Therapy:  He is started [place] on 06/27/2013. He subsequently received definitive radiation therapy for a planned dose of 75 gray completed in November 2014 under the care of Dr. Tammi Johnston.   He developed castration resistant disease with biopsy-proven liver metastasis in May 2016.  Docetaxel 75 mg/m given every 3 weeks with Neulasta support. He is status post 10 cycles of treatment concluded on 11/21/2015. He had an excellent PSA response with PSA dropping from 222 to 0.17.   PSA started to rise again with PSA up to 8.9 in August 2017.  He is status post orchiectomy completed in February 2019.  Zytiga 1000 mg with prednisone since September 2017.   Jevtana chemotherapy given at 20 mg/m every 3 weeks cycle 1 started on 07/29/2018.  He completed 6 cycles of therapy in November 2019.  Therapy discontinued because of patient's preference.  Current therapy: Xtandi 160 mg daily started in May 2020.  Interim History:  [name] Johnston returns today for repeat evaluation.  Since the last visit, he continues to report further decline in his overall health.  He is more weak and debilitated and has lost weight.  Is not reporting any increased pain or discomfort at this time.  He denies any recent hospitalization or illnesses.  He denies any flank pain or discomfort.   He denied any alteration mental status, neuropathy, confusion or dizziness.  Denies any headaches or lethargy.  Denies any night sweats.  Denied orthopnea, dyspnea on exertion or chest discomfort.  Denies shortness of breath, difficulty breathing hemoptysis or cough.  Denies any  abdominal distention, nausea, early satiety or dyspepsia.  Denies any hematuria, frequency, dysuria or nocturia.  Denies any skin irritation, dryness or rash.  Denies any ecchymosis or petechiae.  Denies any lymphadenopathy or clotting.  Denies any heat or cold intolerance.  Denies any anxiety or depression.  Remaining review of system is negative.          Medications: I have reviewed the patient's current medications.  Unchanged by my review. Current Outpatient Medications  Medication Sig Dispense Refill  . enzalutamide (XTANDI) 40 MG capsule Take 4 capsules (160 mg total) by mouth daily. 120 capsule 0  . simethicone (MYLICON) 623 MG chewable tablet 1 to 2 tablets every 6 hours as needed for bloating 60 tablet 2   No current facility-administered medications for this visit.    Facility-Administered Medications Ordered in Other Visits  Medication Dose Route Frequency Provider Last Rate Last Dose  . heparin lock flush 100 unit/mL  500 Units Intracatheter Once [name] Portela, MD      . sodium chloride flush (NS) 0.9 % injection 10 mL  10 mL Intracatheter Once [name] Portela, MD      . sodium chloride flush (NS) 0.9 % injection 10 mL  10 mL Intracatheter PRN [name] Portela, MD   10 mL at 09/30/18 1236     Allergies:  Allergies  Allergen Reactions  . Aspirin Hives    Past Medical History, Surgical history, Social history, and Family History remained unchanged.    Physical Exam:    Blood pressure 138/84, pulse 73, temperature 99.1 F (  37.3 C), temperature source Oral, resp. rate 17, height 5\' 2"  (1.575 m), weight 128 lb 1.6 oz (58.1 kg), SpO2 100 %.      ECOG: 2   General appearance: Comfortable appearing without any discomfort Head: Normocephalic without any trauma Oropharynx: Mucous membranes are moist and pink without any thrush or ulcers. Eyes: Pupils are equal and round reactive to light. Lymph nodes: No cervical, supraclavicular, inguinal or axillary  lymphadenopathy.   Heart:regular rate and rhythm.  S1 and S2 without leg edema. Lung: Clear without any rhonchi or wheezes.  No dullness to percussion. Abdomin: Soft, nontender, nondistended with good bowel sounds.  No hepatosplenomegaly. Musculoskeletal: No joint deformity or effusion.  Full range of motion noted. Neurological: No deficits noted on motor, sensory and deep tendon reflex exam. Skin: No petechial rash or dryness.  Appeared moist.       Lab Results: Lab Results  Component Value Date   WBC 3.8 (L) 06/30/2019   HGB 11.7 (L) 06/30/2019   HCT 35.1 (L) 06/30/2019   MCV 80.5 06/30/2019   PLT 187 06/30/2019     Chemistry      Component Value Date/Time   NA 142 06/30/2019 0921   NA 142 12/17/2017 1303   K 3.7 06/30/2019 0921   K 3.2 (L) 12/17/2017 1303   CL 107 06/30/2019 0921   CO2 26 06/30/2019 0921   CO2 28 12/17/2017 1303   BUN 11 06/30/2019 0921   BUN 14.0 12/17/2017 1303   CREATININE 0.94 06/30/2019 0921   CREATININE 1.1 12/17/2017 1303      Component Value Date/Time   CALCIUM 8.7 (L) 06/30/2019 0921   CALCIUM 9.0 12/17/2017 1303   ALKPHOS 118 06/30/2019 0921   ALKPHOS 85 12/17/2017 1303   AST 15 06/30/2019 0921   AST 12 12/17/2017 1303   ALT 11 06/30/2019 0921   ALT 15 12/17/2017 1303   BILITOT 0.5 06/30/2019 0921   BILITOT 0.41 12/17/2017 1303       Results for [name] Johnston (MRN [id number]) as of 07/04/2019 12:05  Ref. Range 05/19/2019 10:50 06/30/2019 09:21  Prostate Specific Ag, Serum Latest Ref Range: 0.0 - 4.0 ng/mL 39.6 (H) 82.3 (H)      Impression and Plan:  68 year old man with:  1.  Advanced prostate cancer with disease to the liver that is currently castration resistant.   His PSA continues to climb despite active treatment at this time.  The natural course of this disease and treatment options were reviewed.  He has progressed on multiple therapies in the past and the only option moving forward will be repeat chemotherapy.  His benefit  would be marginal given his decline in overall performance status as well as review his exposure to multiple chemotherapy agents.  After discussion today he is in favor stopping all anticancer treatment and proceeding with hospice care only.  This was discussed with the patient and his daughter today via face time and with the help of an interpreter.  At this time all anticancer treatment will be stopped and proceed with hospice.   2.  IV access: Port-A-Cath will be flushed today and in the future as needed.   3. Prognosis: This discussed today with the patient and his daughter.  He has limited life expectancy anticipated less than 6 months and he is hospice eligible.   4. Follow-up: No routine follow-up will be scheduled and continue to rely on hospice for updates and see him as needed.  25  minutes was spent with  the patient face-to-face today.  More than 50% of time was dedicated to reviewing his disease status, treatment options, complications related to therapy and overall prognosis.    Zola Button, MD 7/14/202012:24 PM

## 2019-07-04 NOTE — Telephone Encounter (Signed)
Hospice referral provided to Amber at [facility] with patient daughter provided as the contact.

## 2019-07-12 DIAGNOSIS — R9721 Rising PSA following treatment for malignant neoplasm of prostate: Secondary | ICD-10-CM | POA: Diagnosis not present

## 2019-07-12 DIAGNOSIS — N131 Hydronephrosis with ureteral stricture, not elsewhere classified: Secondary | ICD-10-CM | POA: Diagnosis not present

## 2019-08-12 ENCOUNTER — Other Ambulatory Visit: Payer: Self-pay

## 2019-08-12 ENCOUNTER — Emergency Department (HOSPITAL_COMMUNITY)
Admission: EM | Admit: 2019-08-12 | Discharge: 2019-08-13 | Disposition: A | Discharge status: Home / Self Care | Attending: Emergency Medicine | Admitting: Emergency Medicine

## 2019-08-12 ENCOUNTER — Encounter (HOSPITAL_COMMUNITY): Payer: Self-pay | Admitting: *Deleted

## 2019-08-12 DIAGNOSIS — E785 Hyperlipidemia, unspecified: Secondary | ICD-10-CM | POA: Diagnosis not present

## 2019-08-12 DIAGNOSIS — Z8546 Personal history of malignant neoplasm of prostate: Secondary | ICD-10-CM | POA: Diagnosis not present

## 2019-08-12 DIAGNOSIS — I10 Essential (primary) hypertension: Secondary | ICD-10-CM | POA: Diagnosis not present

## 2019-08-12 DIAGNOSIS — C787 Secondary malignant neoplasm of liver and intrahepatic bile duct: Secondary | ICD-10-CM | POA: Insufficient documentation

## 2019-08-12 DIAGNOSIS — J90 Pleural effusion, not elsewhere classified: Secondary | ICD-10-CM | POA: Diagnosis not present

## 2019-08-12 DIAGNOSIS — C799 Secondary malignant neoplasm of unspecified site: Secondary | ICD-10-CM

## 2019-08-12 DIAGNOSIS — R0602 Shortness of breath: Secondary | ICD-10-CM | POA: Diagnosis present

## 2019-08-12 NOTE — ED Triage Notes (Addendum)
Pt daughter says the pt started having pain (constant) that starts in his abdomen and goes up through his chest, causing him to have a hard time breathing. Started this morning around 5am, woke him up from his sleep. Pt is in hospice (prostate CA with mets). No n/v/d or fevers. When he looks up, he has dizziness. Pt speaks [place].

## 2019-08-13 ENCOUNTER — Emergency Department (HOSPITAL_COMMUNITY)

## 2019-08-13 LAB — HEPATIC FUNCTION PANEL
ALT: 40 U/L (ref 0–44)
AST: 33 U/L (ref 15–41)
Albumin: 3.6 g/dL (ref 3.5–5.0)
Alkaline Phosphatase: 133 U/L — ABNORMAL HIGH (ref 38–126)
Bilirubin, Direct: 0.1 mg/dL (ref 0.0–0.2)
Indirect Bilirubin: 0.5 mg/dL (ref 0.3–0.9)
Total Bilirubin: 0.6 mg/dL (ref 0.3–1.2)
Total Protein: 7 g/dL (ref 6.5–8.1)

## 2019-08-13 LAB — CBC
HCT: 37.8 % — ABNORMAL LOW (ref 39.0–52.0)
Hemoglobin: 12.1 g/dL — ABNORMAL LOW (ref 13.0–17.0)
MCH: 26.5 pg (ref 26.0–34.0)
MCHC: 32 g/dL (ref 30.0–36.0)
MCV: 82.7 fL (ref 80.0–100.0)
Platelets: 239 10*3/uL (ref 150–400)
RBC: 4.57 MIL/uL (ref 4.22–5.81)
RDW: 12.6 % (ref 11.5–15.5)
WBC: 6 10*3/uL (ref 4.0–10.5)
nRBC: 0 % (ref 0.0–0.2)

## 2019-08-13 LAB — LIPASE, BLOOD: Lipase: 21 U/L (ref 11–51)

## 2019-08-13 LAB — BASIC METABOLIC PANEL
Anion gap: 10 (ref 5–15)
BUN: 11 mg/dL (ref 8–23)
CO2: 25 mmol/L (ref 22–32)
Calcium: 9 mg/dL (ref 8.9–10.3)
Chloride: 101 mmol/L (ref 98–111)
Creatinine, Ser: 0.96 mg/dL (ref 0.61–1.24)
GFR calc Af Amer: >60 mL/min (ref >60)
GFR calc non Af Amer: >60 mL/min (ref >60)
Glucose, Bld: 163 mg/dL — ABNORMAL HIGH (ref 70–99)
Potassium: 3.6 mmol/L (ref 3.5–5.1)
Sodium: 136 mmol/L (ref 135–145)

## 2019-08-13 LAB — TROPONIN I (HIGH SENSITIVITY)
Troponin I (High Sensitivity): 3 ng/L (ref <18)
Troponin I (High Sensitivity): 6 ng/L (ref <18)

## 2019-08-13 LAB — PROTIME-INR
INR: 1 (ref 0.8–1.2)
Prothrombin Time: 12.6 seconds (ref 11.4–15.2)

## 2019-08-13 MED ORDER — DOCUSATE SODIUM 100 MG PO CAPS: 100.0000 mg | ORAL_CAPSULE | Freq: Two times a day (BID) | ORAL | 0 refills | Status: AC

## 2019-08-13 MED ORDER — OXYCODONE-ACETAMINOPHEN 5-325 MG PO TABS: 2.0000 | ORAL_TABLET | ORAL | 0 refills | Status: DC | PRN

## 2019-08-13 MED ORDER — OXYCODONE-ACETAMINOPHEN 5-325 MG PO TABS: 2.0000 | ORAL_TABLET | ORAL | 0 refills | Status: AC | PRN

## 2019-08-13 MED ORDER — SODIUM CHLORIDE 0.9% FLUSH: 3.0000 mL | Freq: Once | INTRAVENOUS | Status: DC

## 2019-08-13 MED ORDER — FENTANYL CITRATE (PF) 100 MCG/2ML IJ SOLN
50.0000 ug | Freq: Once | INTRAMUSCULAR | Status: AC
  Administered 2019-08-13: 50 ug via INTRAVENOUS
  Filled 2019-08-13: qty 2

## 2019-08-13 NOTE — ED Notes (Signed)
Patient transported to CT 

## 2019-08-13 NOTE — ED Notes (Signed)
Nurse Maloy RN. will draw THE LAB

## 2019-08-13 NOTE — ED Notes (Signed)
O2 turned off.  Pt maintaining 100% on RA.  Will continue to monitor the need for supplemental O2.

## 2019-08-13 NOTE — ED Notes (Signed)
Pt's O2 sat noted to be in the 80's.  2L of O2 applied via [place] w/ improvement in O2 sat to 93%.
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
## 2019-08-14 MED FILL — OXYCODONE-ACETAMINOPHEN 5-3: 5-325 | 2 days supply | Qty: 20 | Fill #0

## 2019-09-28 ENCOUNTER — Emergency Department (HOSPITAL_COMMUNITY)

## 2019-09-28 ENCOUNTER — Other Ambulatory Visit: Payer: Self-pay

## 2019-09-28 ENCOUNTER — Telehealth: Payer: Self-pay

## 2019-09-28 ENCOUNTER — Emergency Department (HOSPITAL_COMMUNITY)
Admission: EM | Admit: 2019-09-28 | Discharge: 2019-09-29 | Disposition: A | Discharge status: Home / Self Care | Attending: Emergency Medicine | Admitting: Emergency Medicine

## 2019-09-28 DIAGNOSIS — R0602 Shortness of breath: Secondary | ICD-10-CM | POA: Insufficient documentation

## 2019-09-28 DIAGNOSIS — I10 Essential (primary) hypertension: Secondary | ICD-10-CM | POA: Diagnosis not present

## 2019-09-28 DIAGNOSIS — J910 Malignant pleural effusion: Secondary | ICD-10-CM | POA: Diagnosis not present

## 2019-09-28 DIAGNOSIS — Z20828 Contact with and (suspected) exposure to other viral communicable diseases: Secondary | ICD-10-CM | POA: Diagnosis not present

## 2019-09-28 DIAGNOSIS — Z8546 Personal history of malignant neoplasm of prostate: Secondary | ICD-10-CM | POA: Diagnosis not present

## 2019-09-28 DIAGNOSIS — C61 Malignant neoplasm of prostate: Secondary | ICD-10-CM

## 2019-09-28 NOTE — ED Notes (Signed)
Per pt's hospice RN, pt has right lung pleural effusion secondary to CA. Pt is to have a thoracentesis next Wednesday. But pt has become progressively SOB and needed to seek care before then.

## 2019-09-28 NOTE — ED Notes (Signed)
PT is HOH, may not respond when called from waiting room. Family outside and requesting to come in when pt is roomed.

## 2019-09-28 NOTE — Telephone Encounter (Signed)
Received a call from [facility] stating that the patient has been having increased SOB with use of his accessory muscles. He went to the ED in August for this same problem and was found to have a pleural infusion but did not get the recommended thoracentesis because he refused and Hospice RN feels that was due to language barrier. She called hit office on behalf of the hospice MD requesting and outpatient thoracentesis be scheduled to avoid another ED visit. Verbal order for US guided thoracentesis obtained and scheduled. Communicated schedule to Hospice RN Kela Millin at [phone] and contacted patient daughter Hjim and communicated COVID test schedule, location, and directions as well as the information for the thoracentesis. Explained that a need for an interpreter was provided for the thoracentesis. Daughter wrote down all information, questions were answered, and she verbalized understanding.

## 2019-09-28 NOTE — ED Triage Notes (Signed)
Pt has had shortness of breath for the past two weeks but has progressively gotten worse over the past few days.

## 2019-09-29 ENCOUNTER — Emergency Department (HOSPITAL_COMMUNITY)

## 2019-09-29 ENCOUNTER — Encounter (HOSPITAL_COMMUNITY): Payer: Self-pay | Admitting: Radiology

## 2019-09-29 HISTORY — PX: IR THORACENTESIS ASP PLEURAL SPACE W/IMG GUIDE: IMG5380

## 2019-09-29 LAB — CBC WITH DIFFERENTIAL/PLATELET
Abs Immature Granulocytes: 0.01 10*3/uL (ref 0.00–0.07)
Basophils Absolute: 0 10*3/uL (ref 0.0–0.1)
Basophils Relative: 1 %
Eosinophils Absolute: 0.2 10*3/uL (ref 0.0–0.5)
Eosinophils Relative: 4 %
HCT: 34.8 % — ABNORMAL LOW (ref 39.0–52.0)
Hemoglobin: 11.5 g/dL — ABNORMAL LOW (ref 13.0–17.0)
Immature Granulocytes: 0 %
Lymphocytes Relative: 17 %
Lymphs Abs: 0.9 10*3/uL (ref 0.7–4.0)
MCH: 26.8 pg (ref 26.0–34.0)
MCHC: 33 g/dL (ref 30.0–36.0)
MCV: 81.1 fL (ref 80.0–100.0)
Monocytes Absolute: 0.4 10*3/uL (ref 0.1–1.0)
Monocytes Relative: 8 %
Neutro Abs: 3.9 10*3/uL (ref 1.7–7.7)
Neutrophils Relative %: 70 %
Platelets: 204 10*3/uL (ref 150–400)
RBC: 4.29 MIL/uL (ref 4.22–5.81)
RDW: 13.9 % (ref 11.5–15.5)
WBC: 5.5 10*3/uL (ref 4.0–10.5)
nRBC: 0 % (ref 0.0–0.2)

## 2019-09-29 LAB — BASIC METABOLIC PANEL
Anion gap: 13 (ref 5–15)
BUN: 11 mg/dL (ref 8–23)
CO2: 23 mmol/L (ref 22–32)
Calcium: 8.8 mg/dL — ABNORMAL LOW (ref 8.9–10.3)
Chloride: 100 mmol/L (ref 98–111)
Creatinine, Ser: 0.94 mg/dL (ref 0.61–1.24)
GFR calc Af Amer: >60 mL/min (ref >60)
GFR calc non Af Amer: >60 mL/min (ref >60)
Glucose, Bld: 113 mg/dL — ABNORMAL HIGH (ref 70–99)
Potassium: 3.7 mmol/L (ref 3.5–5.1)
Sodium: 136 mmol/L (ref 135–145)

## 2019-09-29 LAB — SARS CORONAVIRUS 2 BY RT PCR (HOSPITAL ORDER, PERFORMED IN ~~LOC~~ HOSPITAL LAB): SARS Coronavirus 2: NEGATIVE

## 2019-09-29 MED ORDER — LIDOCAINE HCL (PF) 1 % IJ SOLN
INTRAMUSCULAR | Status: AC | PRN
  Administered 2019-09-29: 10 mL

## 2019-09-29 MED ORDER — LIDOCAINE HCL 1 % IJ SOLN
INTRAMUSCULAR | Status: AC
  Filled 2019-09-29: qty 20

## 2019-09-29 MED ORDER — HEPARIN SOD (PORK) LOCK FLUSH 100 UNIT/ML IV SOLN
500.0000 [IU] | Freq: Once | INTRAVENOUS | Status: AC
  Administered 2019-09-29: 500 [IU]
  Filled 2019-09-29: qty 5
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
## 2019-09-29 NOTE — Discharge Instructions (Signed)
1.  Follow-up with your doctor at the beginning of the week for recheck. 2.  Return to the emergency department if you develop worsening shortness of breath, fever, pain or other concerning symptoms.

## 2019-09-29 NOTE — ED Notes (Signed)
Per family, pt has lung cancer and has had increased SOB for the last couple of weeks. Pt is on hospice and is scheduled for a thoracentesis on 10/14 however pt's condition has worsened so hospice advised pt to come to the ED for further evaluation.

## 2019-09-29 NOTE — ED Notes (Signed)
Patient verbalizes understanding of discharge instructions. Opportunity for questioning and answers were provided. Armband removed by staff, pt discharged from ED.  

## 2019-09-29 NOTE — Procedures (Signed)
PROCEDURE SUMMARY:  Successful US guided right thoracentesis. Yielded 1850 mL of clear amber fluid. Pt tolerated procedure well. No immediate complications.  Specimen was not sent for labs. CXR ordered.  EBL < 5 mL  Ascencion Dike PA-C 09/29/2019 9:07 AM

## 2019-09-30 ENCOUNTER — Ambulatory Visit (HOSPITAL_COMMUNITY): Admission: RE | Admit: 2019-09-30 | Source: Ambulatory Visit

## 2019-09-30 NOTE — Progress Notes (Signed)
Interpreter for patient arrived at [facility] to help interpret for patients covid test. Patient had not showed by 12:30- patient contacted with help of interpreter, and states he is canceling his covid test and has canceled his appointment for 10/14 at [facility].

## 2019-10-03 ENCOUNTER — Telehealth: Payer: Self-pay

## 2019-10-03 NOTE — Telephone Encounter (Signed)
-----   Message from Wyatt Portela, MD sent at 10/03/2019  2:52 PM EDT ----- Ok to stop Xarelto. Thanks ----- Message ----- From: Tami Lin, RN Sent: 10/03/2019   2:36 PM EDT To: Wyatt Portela, MD  Iver Nestle, PA called stating patient is scheduled for knee surgery on 10/19. She said per your note patient is to have 6 months of anticoagulation. She wants to know if it is ok for patient to stop Xarelto prior to surgery. She is reviewing charts and if you are ok with patient stopping Xarelto she will fax over a clearance form. Lanelle Bal

## 2019-10-04 ENCOUNTER — Ambulatory Visit (HOSPITAL_COMMUNITY): Admission: RE | Admit: 2019-10-04 | Payer: PPO | Source: Ambulatory Visit

## 2019-10-04 ENCOUNTER — Telehealth: Payer: Self-pay

## 2019-10-04 ENCOUNTER — Other Ambulatory Visit: Payer: Self-pay

## 2019-10-04 DIAGNOSIS — Z20822 Contact with and (suspected) exposure to covid-19: Secondary | ICD-10-CM

## 2019-10-04 NOTE — Telephone Encounter (Signed)
Previous note entered by this RN on 10/03/19 at 15:16 was entered in error.

## 2019-10-05 LAB — NOVEL CORONAVIRUS, NAA: SARS-CoV-2, NAA: NOT DETECTED

## 2019-10-06 ENCOUNTER — Ambulatory Visit (HOSPITAL_COMMUNITY)
Admission: RE | Admit: 2019-10-06 | Discharge: 2019-10-06 | Disposition: A | Discharge status: Home / Self Care | Source: Ambulatory Visit | Attending: Radiology | Admitting: Radiology

## 2019-10-06 ENCOUNTER — Ambulatory Visit (HOSPITAL_COMMUNITY)
Admission: RE | Admit: 2019-10-06 | Discharge: 2019-10-06 | Disposition: A | Discharge status: Home / Self Care | Source: Ambulatory Visit | Attending: Oncology | Admitting: Oncology

## 2019-10-06 DIAGNOSIS — J910 Malignant pleural effusion: Secondary | ICD-10-CM | POA: Diagnosis not present

## 2019-10-06 DIAGNOSIS — Z9889 Other specified postprocedural states: Secondary | ICD-10-CM

## 2019-10-06 DIAGNOSIS — C61 Malignant neoplasm of prostate: Secondary | ICD-10-CM | POA: Insufficient documentation

## 2019-10-06 DIAGNOSIS — J90 Pleural effusion, not elsewhere classified: Secondary | ICD-10-CM | POA: Diagnosis not present

## 2019-10-06 MED ORDER — LIDOCAINE HCL 1 % IJ SOLN
INTRAMUSCULAR | Status: AC
  Filled 2019-10-06: qty 10

## 2019-10-06 NOTE — Procedures (Signed)
Ultrasound-guided  therapeutic right thoracentesis performed yielding 2 liters of blood-tinged fluid. No immediate complications. Follow-up chest x-ray pending.EBL none.

## 2019-10-10 ENCOUNTER — Telehealth: Payer: Self-pay

## 2019-10-10 NOTE — Telephone Encounter (Signed)
Called and spoke to Kela Millin, Therapist, sports at [employer] about information below. Ms. Edison Pace to inform patient of plan. Patient to call the office if he has any questions or increased SOB.

## 2019-10-10 NOTE — Telephone Encounter (Signed)
-----   Message from Wyatt Portela, MD sent at 10/10/2019  3:09 PM EDT ----- Regarding: RE: Patient advice request They are to let us know in the future if he develops shortness of breath. Will arrange to have a repeat thora if needed. Thanks ----- Message ----- From: Teodoro Spray, RN Sent: 10/10/2019   2:36 PM EDT To: Wyatt Portela, MD Subject: Patient advice request                         Home health nurse called office on behalf of patient and family. Patient would like to know if he is a candidate for a pleurex drain or if he should have scheduled thoracentesis appointments to avoid having to go to the ED when he needs a thoracentesis.  Patient's last thoracentesis was on 10/06/19. Patient denies increased SOB. RN stated she noticed an improvement since her last assessment of the patient prior to 10/06/19.  Please advise.

## 2019-10-13 ENCOUNTER — Other Ambulatory Visit (HOSPITAL_COMMUNITY): Payer: Self-pay | Admitting: Internal Medicine

## 2019-10-13 ENCOUNTER — Other Ambulatory Visit (HOSPITAL_COMMUNITY)
Admission: RE | Admit: 2019-10-13 | Discharge: 2019-10-13 | Disposition: A | Discharge status: Home / Self Care | Payer: PPO | Source: Other Acute Inpatient Hospital | Attending: Internal Medicine | Admitting: Internal Medicine

## 2019-10-13 DIAGNOSIS — Z20828 Contact with and (suspected) exposure to other viral communicable diseases: Secondary | ICD-10-CM | POA: Diagnosis not present

## 2019-10-13 DIAGNOSIS — Z01812 Encounter for preprocedural laboratory examination: Secondary | ICD-10-CM | POA: Diagnosis not present

## 2019-10-13 DIAGNOSIS — J90 Pleural effusion, not elsewhere classified: Secondary | ICD-10-CM

## 2019-10-14 LAB — NOVEL CORONAVIRUS, NAA (HOSP ORDER, SEND-OUT TO REF LAB; TAT 18-24 HRS): SARS-CoV-2, NAA: NOT DETECTED

## 2019-10-17 ENCOUNTER — Ambulatory Visit (HOSPITAL_COMMUNITY)
Admission: RE | Admit: 2019-10-17 | Discharge: 2019-10-17 | Disposition: A | Discharge status: Home / Self Care | Source: Ambulatory Visit | Attending: Oncology | Admitting: Oncology

## 2019-10-17 ENCOUNTER — Ambulatory Visit (HOSPITAL_COMMUNITY)
Admission: RE | Admit: 2019-10-17 | Discharge: 2019-10-17 | Disposition: A | Discharge status: Home / Self Care | Source: Ambulatory Visit | Attending: Physician Assistant | Admitting: Physician Assistant

## 2019-10-17 ENCOUNTER — Other Ambulatory Visit: Payer: Self-pay

## 2019-10-17 ENCOUNTER — Encounter (HOSPITAL_COMMUNITY): Payer: Self-pay | Admitting: Physician Assistant

## 2019-10-17 ENCOUNTER — Other Ambulatory Visit (HOSPITAL_COMMUNITY): Payer: Self-pay | Admitting: Physician Assistant

## 2019-10-17 DIAGNOSIS — J90 Pleural effusion, not elsewhere classified: Secondary | ICD-10-CM | POA: Insufficient documentation

## 2019-10-17 DIAGNOSIS — Z9889 Other specified postprocedural states: Secondary | ICD-10-CM

## 2019-10-17 HISTORY — PX: IR THORACENTESIS ASP PLEURAL SPACE W/IMG GUIDE: IMG5380

## 2019-10-17 MED ORDER — LIDOCAINE HCL (PF) 1 % IJ SOLN
INTRAMUSCULAR | Status: DC | PRN
  Administered 2019-10-17: 10 mL

## 2019-10-17 MED ORDER — LIDOCAINE HCL 1 % IJ SOLN
INTRAMUSCULAR | Status: AC
  Filled 2019-10-17: qty 20

## 2019-10-17 NOTE — Procedures (Signed)
PROCEDURE SUMMARY:  Successful US guided right thoracentesis. Yielded 1.5 of amber fluid. Patient tolerated procedure well. No immediate complications. EBL = trace  Specimen was sent for labs.  Post procedure chest X-ray reveals no pneumothorax  WENDY S BLAIR PA-C 10/17/2019 3:03 PM

## 2019-10-27 ENCOUNTER — Telehealth: Payer: Self-pay

## 2019-10-27 ENCOUNTER — Other Ambulatory Visit: Payer: Self-pay | Admitting: Oncology

## 2019-10-27 DIAGNOSIS — C61 Malignant neoplasm of prostate: Secondary | ICD-10-CM

## 2019-10-27 NOTE — Telephone Encounter (Signed)
Received call from Domenic Moras, patient's home health nurse stating that patient's lungs are starting to sound full of fluid and appears to be needing another thoracentesis. Nurse states patient had thoracentesis last week, and is requesting for patient to be scheduled for a thoracentesis next week.  Dr. Alen Blew made aware, orders entered for thoracentesis to be scheduled next week.  Home health nurse to administer covid test today so that it can be resulted prior to the thoracentesis.  Appointment scheduled and daughter (Hjim) informed of date and time.  Per daughter, patient has an appointment scheduled with another physician on the date and time that was given for the thoracentesis.  Patient's daughter given phone number to central scheduling and was instructed to call to reschedule appointment.  Patient instructed to go to the ED if symptoms worsen over the weekend.  Daughter verbalized understanding.

## 2019-10-29 IMAGING — NM NM RENAL IMAGING FLOW W/ PHARM
2 series · 12 of 12 positions shown · non-contrast
Comparison: CT scan November 25, 2018

CLINICAL DATA: UPJ obstruction.

EXAM:
NUCLEAR MEDICINE RENAL SCAN WITH DIURETIC ADMINISTRATION
TECHNIQUE: Radionuclide angiographic and sequential renal images were obtained
after intravenous injection of radiopharmaceutical. Imaging was
continued during slow intravenous injection of Lasix approximately
15 minutes after the start of the examination.
RADIOPHARMACEUTICALS:  5.3 mCi Bechnetium-PPm MAG3 IV

[Series 1: renal scan · 4.14mm/px · 6 of 40 frames shown (1 of 2)]
[frame 4/40  full-range]
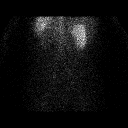
[frame 10/40  full-range]
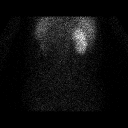
[frame 17/40  full-range]
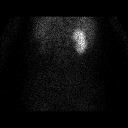
[frame 24/40  full-range]
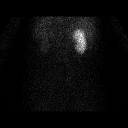
[frame 30/40  full-range]
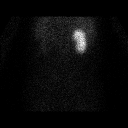
[frame 37/40  full-range]
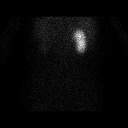

[Series 1: renal scan · 4.14mm/px · 6 of 50 frames shown (2 of 2)]
[frame 5/50]
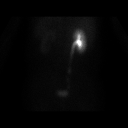
[frame 13/50]
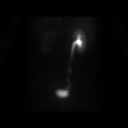
[frame 21/50]
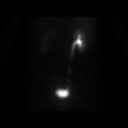
[frame 30/50]
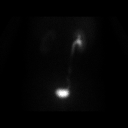
[frame 38/50]
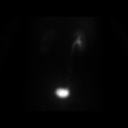
[frame 46/50]
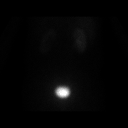

[12 of 12 positions shown; findings below may reference images not displayed]

FINDINGS: Flow: There is prompt normal appearing flow on the right. Minimal
flow seen on the left..

Left renogram: Very mild activity collects in the left renal cortex
throughout the study with no excretion.

Right renogram: Normal time to maximum cortical uptake and time to
excretion on the right. The right kidney appears to washout normally
starting before Lasix and continuing after.

Differential:

Left kidney = 9 %

Right kidney = 91 %

T1/2 post Lasix :

Left kidney = not applicable.  No excretion seen on the left.

Right kidney = not calculated. The right renal T half max is
minutes, reached prior to Lasix administration.
IMPRESSION: 1. Normal right kidney.
2. Abnormally functioning left kidney. Mild activity collects in the
left renal cortex throughout the study without excretion. The cause
for underlying left renal dysfunction cannot be determined on this
study. A high-grade obstruction is not excluded as the cause for
left renal dysfunction.

## 2019-11-01 ENCOUNTER — Ambulatory Visit (HOSPITAL_COMMUNITY): Payer: PPO

## 2019-11-02 ENCOUNTER — Ambulatory Visit (HOSPITAL_COMMUNITY)
Admission: RE | Admit: 2019-11-02 | Discharge: 2019-11-02 | Disposition: A | Discharge status: Home / Self Care | Source: Ambulatory Visit | Attending: Oncology | Admitting: Oncology

## 2019-11-02 ENCOUNTER — Ambulatory Visit (HOSPITAL_COMMUNITY)
Admission: RE | Admit: 2019-11-02 | Discharge: 2019-11-02 | Disposition: A | Discharge status: Home / Self Care | Source: Ambulatory Visit | Attending: Student | Admitting: Student

## 2019-11-02 ENCOUNTER — Other Ambulatory Visit: Payer: Self-pay

## 2019-11-02 DIAGNOSIS — Z9889 Other specified postprocedural states: Secondary | ICD-10-CM | POA: Insufficient documentation

## 2019-11-02 DIAGNOSIS — J910 Malignant pleural effusion: Secondary | ICD-10-CM | POA: Diagnosis not present

## 2019-11-02 DIAGNOSIS — C61 Malignant neoplasm of prostate: Secondary | ICD-10-CM | POA: Insufficient documentation

## 2019-11-02 DIAGNOSIS — J90 Pleural effusion, not elsewhere classified: Secondary | ICD-10-CM | POA: Insufficient documentation

## 2019-11-02 MED ORDER — LIDOCAINE HCL 1 % IJ SOLN
INTRAMUSCULAR | Status: AC
  Filled 2019-11-02: qty 10

## 2019-11-02 NOTE — Procedures (Signed)
PROCEDURE SUMMARY:  Successful image-guided right thoracentesis. Yielded 1.85 liters of hazy amber fluid. Patient tolerated procedure well. No immediate complications. EBL < 5 mL.  Specimen was not sent for labs. CXR ordered.  Please see imaging section of Epic for full dictation.   Claris Pong Zooey Schreurs PA-C 11/02/2019 11:45 AM

## 2019-11-03 ENCOUNTER — Encounter (HOSPITAL_BASED_OUTPATIENT_CLINIC_OR_DEPARTMENT_OTHER): Payer: Self-pay | Admitting: *Deleted

## 2019-11-03 ENCOUNTER — Other Ambulatory Visit: Payer: Self-pay | Admitting: Urology

## 2019-11-03 ENCOUNTER — Other Ambulatory Visit: Payer: Self-pay

## 2019-11-03 NOTE — Progress Notes (Addendum)
Spoke w/ via phone for pre-op interview---daughter hjiem ayor Lab needs dos----  I sta 8            Lab results------chest xray 11-02-19 epic/chartekg 09-28-19 chart/epic COVID test -----11-06-2019- Arrive at -------915 am NPO after ------midnight Medications to take morning of surgery -----percocet prn Diabetic medication -----n/a Patient Special Instructions -----daughter requested rhade interpreter Humberto Leep, email request sent to interpreting for day of surgery Pre-Op special Istructions ----- Patient verbalized understanding of instructions that were given at this phone interview. Patient denies shortness of breath, chest pain, fever, cough a this phone interview.

## 2019-11-06 ENCOUNTER — Other Ambulatory Visit: Payer: Self-pay

## 2019-11-06 ENCOUNTER — Inpatient Hospital Stay (HOSPITAL_COMMUNITY): Admission: RE | Admit: 2019-11-06 | Payer: PPO | Source: Ambulatory Visit

## 2019-11-06 DIAGNOSIS — Z20822 Contact with and (suspected) exposure to covid-19: Secondary | ICD-10-CM

## 2019-11-07 ENCOUNTER — Telehealth: Payer: Self-pay

## 2019-11-07 ENCOUNTER — Other Ambulatory Visit: Payer: Self-pay

## 2019-11-07 DIAGNOSIS — C61 Malignant neoplasm of prostate: Secondary | ICD-10-CM

## 2019-11-07 LAB — NOVEL CORONAVIRUS, NAA: SARS-CoV-2, NAA: NOT DETECTED

## 2019-11-07 NOTE — Progress Notes (Signed)
Seth Bake with [facility] called and requested a thoracentesis for patient next week due to shortness of breath.  Per Seth Bake, patient prefers Tuesday 11/14/19. Dr. Alen Blew made aware and ok given to place orders for Thoracentesis. Seth Bake aware that patient will need a COVID test prior to Thoracentesis. Will let Seth Bake know date and time once Thoracentesis is scheduled so patient can plan COVID test within specified time range of test.

## 2019-11-07 NOTE — Telephone Encounter (Signed)
Spoke to Seth Bake with [facility] and made her aware that patient is scheduled for a Thoracentesis on Tuesday 11/14/19 at 11 am. She verbalized understanding and will make patient and his family aware. Confirmed COVID testing hours at [facility] and they are open on Saturday from 9-12:30. Seth Bake will also make patient aware of need for COVID test on Saturday.

## 2019-11-08 ENCOUNTER — Encounter (HOSPITAL_BASED_OUTPATIENT_CLINIC_OR_DEPARTMENT_OTHER): Payer: Self-pay | Admitting: *Deleted

## 2019-11-08 NOTE — H&P (Signed)
CC: I have hydronephrosis.  HPI: Ranier Coach is a 68 year-old male established patient who is here for hydronephrosis.  The problem is on the left side. He had the following x-rays done: Renal Ultrasound and Nuclear renal scan.   He is not currently having flank pain, back pain, groin pain, nausea, vomiting, fever or chills.   11/11/20Wille Glaser returns in f/u. He has a ureteral stone on the left that was placed in May. He has pain with voiding for the last month. He has had some abdominal distention. He has no hematuria.   02/20/19: Patient with below noted history. He underwent cysto and left ureteral stent replacement on 05/08/19 Left retrograde revealed 2 areas of obstruction in the distal ureter consistent with extrinsic compression wit proximal dilation. He returns today for follow up. Overall, he states that he feels he is tolerating the stent better this time. He denies dysuria or exacerbation of voiding symptoms. He continues to have some nocturia q2-3hr, which can be bothersome at times. He denies gross hematuria, fevers, chills, nausea, or vomiting. No unilateral flank pain. He is now on Hospice. His last PSA was up to 83 on7/10/20 at his last visit with Dr. Alen Blew and no further options were available. His weight is down some. He has no bone pain.   02/01/19: Drury returns today in f/u He had left hydro on an [place] on 01/23/19 after removal of the left ureteral stent. He had a renogram to further assess the situation and he had high grade obstruction with only 9% function. He has no pain in the flank or fever. He has had no hematuria.    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 He has had a favorable PSA response to chemotherapy and on CT I no longer appreciate a mass effect in the area of the left distal ureter. He is symptomatic with the stent so a trial without stent is indicated.      ALLERGIES: Aspirin Low Dose TABS    MEDICATIONS: Famotidine 20 mg tablet 1 tablet PO Daily  Morphine Sulfate 100 mg/5 ml (20 mg/ml) solution, oral 1 ml PO Daily  Prednisone 10 mg tablet 1 tablet PO Daily  Prochlorperazine Maleate 10 mg tablet 1 tablet PO Daily  Simethicone 125 mg tablet,chewable 1 tablet PO Daily  Tylenol     GU PSH: Cysto Dilate Stricture (M or F) - 2019 Cysto Remove Stent FB Sim - 01/17/2019 Cystoscopy Insert Stent, Left - 05/08/2019, Left - 02/06/2019, Left - 10/11/2018, Left - 06/21/2018, Left - 2019 Cystoscopy TURP - 2014 Hydrocele repair, Left - 2019 Remove Prostate Regrowth - 2015 Simple orchiectomy, Bilateral - 2019     NON-GU PSH: None   GU PMH: Ureteral obstruction, He is tolerating his stent well but is now on Hospice care for his CRCP. I will have him return in 3 months and reassess the need for stent exchange at that time. - 07/12/2019, - 02/20/2019 (Worsening), He has severe obstruction of the left ureter and after reviewing the options. I am going to replace the stent. I reviewed the risks in detail including the possible need for a perc tube. I will get this set up for next week. , - 02/01/2019, - 01/23/2019, - 01/17/2019 Prostate Cancer, He has responded to current therapy with a decline in the PSA to 1.6. On the CT in December I don't see the mass  effect in the area of the distal ureter on the left. With his persistent hematuria and voiding symptoms, I am going to have him return for stent removal with the understanding that we may either have to replace the stent or a perc tube if the hydro recurs. - 01/11/2019, - 11/01/2018, - 10/13/2018, He has CRCP with visceral mets but is responding to [place]. , - 10/04/2018 (Worsening), His PSA is rising on ADT and Abiraterone. , - 06/13/2018, - 2019 (Worsening), He has CRCP with progression on Zytiga and Lupron with prior docetaxal. 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 I am going to get him set up for a left hydrocelectomy and bilateral orchiectomy which will eliminate the need for lupron. I have reviewed the risks of bleeding, infection, recurrent hydrocele, thrombotic events and anesthetic complications. , - 2019 Chronic kidney disease stage 3 (GFR 30-60) - 2019 Hydronephrosis, He has malignant left ureteral obstruction with some pain and progressive AKI. I will do cystoscopy with stenting at the time of the hydrocelectomy. I reviewed this additional risks including inability to place a stent. I am not sure I would recommend a perc if I can't get a stent up with his progressive CRCP. 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 Niswander has a history of prostate cancer. He had a cysto and TURP in June 2014 which revealed Gleason's 4+4 equals 8 adenocarcinoma involving approximate 70% of the resected prostate tissue. He had radiation and has been on hormonal therapy since July 2014. Testosterone has confirmed to be at castrate levels. PSA was 0.33 in Feb 2015.       NON-GU PMH: Encounter for general adult medical examination without  abnormal findings, Encounter for preventive health examination - 2017 Asthma, Asthma - 2014    FAMILY HISTORY: Family Health Status - Father alive at age 15 - Father Family Health Status - Mother's Age - Mother Family Health Status Number - Runs In Family No Significant Family History - Runs In Family   SOCIAL HISTORY: Marital Status: Married Ethnicity: Not Hispanic Or Latino; Race: Unknown Current Smoking Status: Patient has never smoked.  Has never drank.  Does not drink caffeine.    REVIEW OF SYSTEMS:    GU Review Male:   Patient reports frequent urination, burning/ pain with urination, and get up at night to urinate. Patient denies hard to postpone urination, leakage of urine, stream starts and stops, trouble starting your stream, have to strain to urinate , erection problems, and penile pain.  Gastrointestinal (Upper):   Patient denies nausea, vomiting, and indigestion/ heartburn.  Gastrointestinal (Lower):   Patient denies diarrhea and constipation.  Constitutional:   Patient denies fever, night sweats, weight loss, and fatigue.  Skin:   Patient denies skin rash/ lesion and itching.  Eyes:   Patient denies blurred vision and double vision.  Ears/ Nose/ Throat:   Patient denies sore throat and sinus problems.  Hematologic/Lymphatic:   Patient denies swollen glands and easy bruising.  Cardiovascular:   Patient denies leg swelling and chest pains.  Respiratory:   Patient denies cough and shortness of breath.  Endocrine:   Patient denies excessive thirst.  Musculoskeletal:   Patient denies back pain and joint pain.  Neurological:   Patient denies headaches and dizziness.  Psychologic:   Patient denies depression and anxiety.   VITAL SIGNS:      11/01/2019 11:10 AM  BP 106/65 mmHg  Pulse 79 /min  Temperature 97.3 F / 36.2 C   MULTI-SYSTEM PHYSICAL EXAMINATION:    Constitutional: Well-nourished. No physical deformities. Normally developed. Good grooming.  Respiratory: No  labored breathing, no use of accessory muscles. decreased BS on the right.   Cardiovascular: Regular rate and rhythm. No murmur, no gallop.      PAST DATA REVIEWED:  Source Of History:  Patient  Urine Test Review:   Urinalysis  X-Ray Review: Chest X-Ray Outside.: Reviewed Films. Discussed With Patient. 10/20. Top of stent seen in good position.  KUB: Reviewed Films. Discussed With Patient.  C.T. Chest: Reviewed Films. Discussed With Patient.     12/02/18 09/08/18 03/15/15 10/30/14 06/19/14 02/13/14 11/07/13 08/11/13  PSA  Total PSA 1.6 ng/dl 3.3 ng/dl 79.34  11.08  0.44  0.33  0.76  2.97     03/14/15 10/29/14 02/13/14 11/07/13 08/11/13  Hormones  Testosterone, Total '30  19  20  ' 33  30     PROCEDURES:         KUB - 74018  A single view of the abdomen is obtained. The left Polaris stent is in good position. There are gold seeds in the prostate. No bone, gas or soft tissue abnormalities are noted.       Patient confirmed No Neulasta OnPro Device.            Urinalysis w/Scope Dipstick Dipstick Cont'd Micro  Color: Amber Bilirubin: Neg mg/dL WBC/hpf: 20 - 40/hpf  Appearance: Cloudy Ketones: Neg mg/dL RBC/hpf: >60/hpf  Specific Gravity: 1.025 Blood: 3+ ery/uL Bacteria: Rare (0-9/hpf)  pH: 6.0 Protein: 3+ mg/dL Cystals: NS (Not Seen)  Glucose: Neg mg/dL Urobilinogen: 0.2 mg/dL Casts: NS (Not Seen)    Nitrites: Positive Trichomonas: Not Present    Leukocyte Esterase: 3+ leu/uL Mucous: Not Present      Epithelial Cells: NS (Not Seen)      Yeast: NS (Not Seen)      Sperm: Not Present    Notes: MICROSCOPIC NOT CONCENTRATED.    ASSESSMENT:      ICD-10 Details  1 GU:   Ureteral obstruction - N13.1 He is having increased pain that is likely from the stent which has been in for 6 months. I am going to get him set up for a stent exchange and reviewed the risks. We will try to get him scheduled 2-3 days after one of his thoracentesis procedures.   2   Dysuria - R30.0 Worsening -  Urine culture today. I will treat if positive but abx have not help his symptoms to date.   PLAN:           Orders Labs Urine Culture  X-Rays: KUB  Schedule Return Visit/Planned Activity: ASAP - Schedule Surgery             Note: Left stent exchange          Document Letter(s):  Created for Patient: Clinical Summary
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
## 2019-11-08 NOTE — Progress Notes (Signed)
Spoke with Janett Billow interpreting Janett Billow aware patient moved to main or due to increasing sob, montagnard rhade interperter to arrive 915 am [place] main entrance admitting pam gibson spoke to patient daughter  Cranford Mon and made aware surgery moved to [place] or arrive 915 am admitting 11-09-2019

## 2019-11-08 NOTE — Progress Notes (Signed)
Spoke with daughter Laurice Record aware surgery moved to [place] [facility] arrive 915 am 11-09-19 admitting

## 2019-11-09 ENCOUNTER — Encounter (HOSPITAL_COMMUNITY): Payer: Self-pay | Admitting: Anesthesiology

## 2019-11-09 ENCOUNTER — Ambulatory Visit (HOSPITAL_COMMUNITY)
Admission: RE | Admit: 2019-11-09 | Discharge: 2019-11-09 | Disposition: A | Discharge status: Home / Self Care | Attending: Urology | Admitting: Urology

## 2019-11-09 ENCOUNTER — Ambulatory Visit (HOSPITAL_COMMUNITY): Admitting: Physician Assistant

## 2019-11-09 ENCOUNTER — Encounter (HOSPITAL_COMMUNITY)
Admission: RE | Disposition: A | Discharge status: Home / Self Care | Payer: Self-pay | Source: Home / Self Care | Attending: Urology

## 2019-11-09 ENCOUNTER — Ambulatory Visit (HOSPITAL_COMMUNITY)

## 2019-11-09 DIAGNOSIS — C61 Malignant neoplasm of prostate: Secondary | ICD-10-CM | POA: Diagnosis not present

## 2019-11-09 DIAGNOSIS — Z79891 Long term (current) use of opiate analgesic: Secondary | ICD-10-CM | POA: Insufficient documentation

## 2019-11-09 DIAGNOSIS — N4889 Other specified disorders of penis: Secondary | ICD-10-CM | POA: Diagnosis not present

## 2019-11-09 DIAGNOSIS — Z923 Personal history of irradiation: Secondary | ICD-10-CM | POA: Insufficient documentation

## 2019-11-09 DIAGNOSIS — Z7952 Long term (current) use of systemic steroids: Secondary | ICD-10-CM | POA: Insufficient documentation

## 2019-11-09 DIAGNOSIS — J90 Pleural effusion, not elsewhere classified: Secondary | ICD-10-CM | POA: Insufficient documentation

## 2019-11-09 DIAGNOSIS — N135 Crossing vessel and stricture of ureter without hydronephrosis: Secondary | ICD-10-CM | POA: Diagnosis present

## 2019-11-09 DIAGNOSIS — N136 Pyonephrosis: Secondary | ICD-10-CM | POA: Diagnosis not present

## 2019-11-09 DIAGNOSIS — N131 Hydronephrosis with ureteral stricture, not elsewhere classified: Secondary | ICD-10-CM | POA: Diagnosis not present

## 2019-11-09 DIAGNOSIS — Z79899 Other long term (current) drug therapy: Secondary | ICD-10-CM | POA: Diagnosis not present

## 2019-11-09 DIAGNOSIS — T8384XA Pain from genitourinary prosthetic devices, implants and grafts, initial encounter: Secondary | ICD-10-CM | POA: Diagnosis not present

## 2019-11-09 DIAGNOSIS — I10 Essential (primary) hypertension: Secondary | ICD-10-CM | POA: Insufficient documentation

## 2019-11-09 DIAGNOSIS — R6881 Early satiety: Secondary | ICD-10-CM | POA: Diagnosis not present

## 2019-11-09 DIAGNOSIS — A419 Sepsis, unspecified organism: Secondary | ICD-10-CM | POA: Diagnosis not present

## 2019-11-09 HISTORY — PX: CYSTOSCOPY WITH STENT PLACEMENT: SHX5790

## 2019-11-09 LAB — CBC
HCT: 35.9 % — ABNORMAL LOW (ref 39.0–52.0)
Hemoglobin: 11.3 g/dL — ABNORMAL LOW (ref 13.0–17.0)
MCH: 26.5 pg (ref 26.0–34.0)
MCHC: 31.5 g/dL (ref 30.0–36.0)
MCV: 84.1 fL (ref 80.0–100.0)
Platelets: 231 10*3/uL (ref 150–400)
RBC: 4.27 MIL/uL (ref 4.22–5.81)
RDW: 16.9 % — ABNORMAL HIGH (ref 11.5–15.5)
WBC: 6.1 10*3/uL (ref 4.0–10.5)
nRBC: 0 % (ref 0.0–0.2)

## 2019-11-09 LAB — BASIC METABOLIC PANEL
Anion gap: 13 (ref 5–15)
BUN: 12 mg/dL (ref 8–23)
CO2: 22 mmol/L (ref 22–32)
Calcium: 8.8 mg/dL — ABNORMAL LOW (ref 8.9–10.3)
Chloride: 105 mmol/L (ref 98–111)
Creatinine, Ser: 0.89 mg/dL (ref 0.61–1.24)
GFR calc Af Amer: >60 mL/min (ref >60)
GFR calc non Af Amer: >60 mL/min (ref >60)
Glucose, Bld: 99 mg/dL (ref 70–99)
Potassium: 4.1 mmol/L (ref 3.5–5.1)
Sodium: 140 mmol/L (ref 135–145)

## 2019-11-09 SURGERY — CYSTOSCOPY, WITH STENT INSERTION
Anesthesia: General | Laterality: Left

## 2019-11-09 MED ORDER — ONDANSETRON HCL 4 MG/2ML IJ SOLN
INTRAMUSCULAR | Status: AC
  Filled 2019-11-09: qty 2

## 2019-11-09 MED ORDER — PHENYLEPHRINE 40 MCG/ML (10ML) SYRINGE FOR IV PUSH (FOR BLOOD PRESSURE SUPPORT)
PREFILLED_SYRINGE | INTRAVENOUS | Status: AC
  Filled 2019-11-09: qty 10

## 2019-11-09 MED ORDER — DEXAMETHASONE SODIUM PHOSPHATE 10 MG/ML IJ SOLN
INTRAMUSCULAR | Status: DC | PRN
  Administered 2019-11-09: 4 mg via INTRAVENOUS

## 2019-11-09 MED ORDER — PHENYLEPHRINE 40 MCG/ML (10ML) SYRINGE FOR IV PUSH (FOR BLOOD PRESSURE SUPPORT)
PREFILLED_SYRINGE | INTRAVENOUS | Status: AC
  Filled 2019-11-09: qty 20

## 2019-11-09 MED ORDER — PHENYLEPHRINE HCL (PRESSORS) 10 MG/ML IV SOLN
INTRAVENOUS | Status: AC
  Filled 2019-11-09: qty 1

## 2019-11-09 MED ORDER — OXYCODONE HCL 5 MG PO TABS
ORAL_TABLET | ORAL | Status: AC
  Administered 2019-11-09: 5 mg via ORAL
  Filled 2019-11-09: qty 1

## 2019-11-09 MED ORDER — SODIUM CHLORIDE 0.9% FLUSH: 3.0000 mL | INTRAVENOUS | Status: DC | PRN

## 2019-11-09 MED ORDER — DEXAMETHASONE SODIUM PHOSPHATE 10 MG/ML IJ SOLN
INTRAMUSCULAR | Status: AC
  Filled 2019-11-09: qty 1

## 2019-11-09 MED ORDER — SODIUM CHLORIDE 0.9 % IV SOLN
2.0000 g | Freq: Once | INTRAVENOUS | Status: AC
  Administered 2019-11-09: 2 g via INTRAVENOUS
  Filled 2019-11-09: qty 2

## 2019-11-09 MED ORDER — LIDOCAINE 2% (20 MG/ML) 5 ML SYRINGE
INTRAMUSCULAR | Status: AC
  Filled 2019-11-09: qty 5

## 2019-11-09 MED ORDER — PROPOFOL 10 MG/ML IV BOLUS
INTRAVENOUS | Status: DC | PRN
  Administered 2019-11-09: 130 mg via INTRAVENOUS

## 2019-11-09 MED ORDER — ACETAMINOPHEN 650 MG RE SUPP
650.0000 mg | RECTAL | Status: DC | PRN
  Filled 2019-11-09: qty 1

## 2019-11-09 MED ORDER — FENTANYL CITRATE (PF) 100 MCG/2ML IJ SOLN
25.0000 ug | INTRAMUSCULAR | Status: DC | PRN
  Administered 2019-11-09: 13:00:00 50 ug via INTRAVENOUS

## 2019-11-09 MED ORDER — ROCURONIUM BROMIDE 10 MG/ML (PF) SYRINGE
PREFILLED_SYRINGE | INTRAVENOUS | Status: AC
  Filled 2019-11-09: qty 10

## 2019-11-09 MED ORDER — SUCCINYLCHOLINE CHLORIDE 200 MG/10ML IV SOSY
PREFILLED_SYRINGE | INTRAVENOUS | Status: AC
  Filled 2019-11-09: qty 10

## 2019-11-09 MED ORDER — ACETAMINOPHEN 10 MG/ML IV SOLN
INTRAVENOUS | Status: AC
  Administered 2019-11-09: 1000 mg via INTRAVENOUS
  Filled 2019-11-09: qty 100

## 2019-11-09 MED ORDER — OXYCODONE HCL 5 MG PO TABS
5.0000 mg | ORAL_TABLET | ORAL | Status: DC | PRN
  Administered 2019-11-09: 13:00:00 5 mg via ORAL

## 2019-11-09 MED ORDER — MORPHINE SULFATE (PF) 4 MG/ML IV SOLN
INTRAVENOUS | Status: AC
  Administered 2019-11-09: 2 mg via INTRAVENOUS
  Filled 2019-11-09: qty 1

## 2019-11-09 MED ORDER — FENTANYL CITRATE (PF) 100 MCG/2ML IJ SOLN
INTRAMUSCULAR | Status: AC
  Filled 2019-11-09: qty 2

## 2019-11-09 MED ORDER — LACTATED RINGERS IV SOLN
INTRAVENOUS | Status: DC
  Administered 2019-11-09 (×2): via INTRAVENOUS

## 2019-11-09 MED ORDER — PROMETHAZINE HCL 25 MG/ML IJ SOLN: 6.2500 mg | INTRAMUSCULAR | Status: DC | PRN

## 2019-11-09 MED ORDER — SULFAMETHOXAZOLE-TRIMETHOPRIM 800-160 MG PO TABS: 1.0000 | ORAL_TABLET | Freq: Two times a day (BID) | ORAL | 0 refills | Status: AC

## 2019-11-09 MED ORDER — MEPERIDINE HCL 50 MG/ML IJ SOLN: 6.2500 mg | INTRAMUSCULAR | Status: DC | PRN

## 2019-11-09 MED ORDER — STERILE WATER FOR IRRIGATION IR SOLN
Status: DC | PRN
  Administered 2019-11-09: 3000 mL

## 2019-11-09 MED ORDER — MORPHINE SULFATE (PF) 4 MG/ML IV SOLN
2.0000 mg | INTRAVENOUS | Status: DC | PRN
  Administered 2019-11-09: 13:00:00 2 mg via INTRAVENOUS

## 2019-11-09 MED ORDER — FENTANYL CITRATE (PF) 100 MCG/2ML IJ SOLN
INTRAMUSCULAR | Status: AC
  Administered 2019-11-09: 50 ug via INTRAVENOUS
  Filled 2019-11-09: qty 2

## 2019-11-09 MED ORDER — ONDANSETRON HCL 4 MG/2ML IJ SOLN
INTRAMUSCULAR | Status: DC | PRN
  Administered 2019-11-09: 4 mg via INTRAVENOUS

## 2019-11-09 MED ORDER — EPHEDRINE 5 MG/ML INJ
INTRAVENOUS | Status: AC
  Filled 2019-11-09: qty 10

## 2019-11-09 MED ORDER — SODIUM CHLORIDE 0.9 % IV SOLN: 250.0000 mL | INTRAVENOUS | Status: DC | PRN

## 2019-11-09 MED ORDER — PHENYLEPHRINE 40 MCG/ML (10ML) SYRINGE FOR IV PUSH (FOR BLOOD PRESSURE SUPPORT)
PREFILLED_SYRINGE | INTRAVENOUS | Status: DC | PRN
  Administered 2019-11-09: 160 ug via INTRAVENOUS
  Administered 2019-11-09 (×2): 120 ug via INTRAVENOUS

## 2019-11-09 MED ORDER — LACTATED RINGERS IV SOLN: INTRAVENOUS | Status: DC

## 2019-11-09 MED ORDER — ACETAMINOPHEN 325 MG PO TABS: 325.0000 mg | ORAL_TABLET | Freq: Once | ORAL | Status: DC | PRN

## 2019-11-09 MED ORDER — SODIUM CHLORIDE 0.9% FLUSH: 3.0000 mL | Freq: Two times a day (BID) | INTRAVENOUS | Status: DC

## 2019-11-09 MED ORDER — PROPOFOL 10 MG/ML IV BOLUS
INTRAVENOUS | Status: AC
  Filled 2019-11-09: qty 20

## 2019-11-09 MED ORDER — FENTANYL CITRATE (PF) 100 MCG/2ML IJ SOLN
INTRAMUSCULAR | Status: DC | PRN
  Administered 2019-11-09 (×2): 50 ug via INTRAVENOUS

## 2019-11-09 MED ORDER — LIDOCAINE 2% (20 MG/ML) 5 ML SYRINGE
INTRAMUSCULAR | Status: DC | PRN
  Administered 2019-11-09: 40 mg via INTRAVENOUS

## 2019-11-09 MED ORDER — EPHEDRINE SULFATE-NACL 50-0.9 MG/10ML-% IV SOSY
PREFILLED_SYRINGE | INTRAVENOUS | Status: DC | PRN
  Administered 2019-11-09: 10 mg via INTRAVENOUS

## 2019-11-09 MED ORDER — ACETAMINOPHEN 325 MG PO TABS: 650.0000 mg | ORAL_TABLET | ORAL | Status: DC | PRN

## 2019-11-09 MED ORDER — ACETAMINOPHEN 160 MG/5ML PO SOLN: 325.0000 mg | Freq: Once | ORAL | Status: DC | PRN

## 2019-11-09 MED ORDER — ACETAMINOPHEN 10 MG/ML IV SOLN
1000.0000 mg | Freq: Once | INTRAVENOUS | Status: DC | PRN
  Administered 2019-11-09: 13:00:00 1000 mg via INTRAVENOUS

## 2019-11-09 MED ORDER — SODIUM CHLORIDE 0.9 % IV SOLN: INTRAVENOUS | Status: DC

## 2019-11-09 SURGICAL SUPPLY — 18 items
BAG URINE DRAIN 2000ML AR STRL (UROLOGICAL SUPPLIES) ×3 IMPLANT
BAG URO CATCHER STRL LF (MISCELLANEOUS) ×3 IMPLANT
CATH FOLEY 2WAY SLVR  5CC 16FR (CATHETERS) ×2
CATH FOLEY 2WAY SLVR 5CC 16FR (CATHETERS) ×1 IMPLANT
CATH ROBINSON RED A/P 14FR (CATHETERS) ×3 IMPLANT
CATH URET 5FR 28IN OPEN ENDED (CATHETERS) IMPLANT
CLOTH BEACON ORANGE TIMEOUT ST (SAFETY) ×3 IMPLANT
EXTRACTOR STONE NITINOL NGAGE (UROLOGICAL SUPPLIES) ×3 IMPLANT
GLOVE SURG SS PI 8.0 STRL IVOR (GLOVE) ×3 IMPLANT
GOWN STRL REUS W/TWL XL LVL3 (GOWN DISPOSABLE) ×3 IMPLANT
GUIDEWIRE STR DUAL SENSOR (WIRE) ×3 IMPLANT
KIT TURNOVER KIT A (KITS) IMPLANT
MANIFOLD NEPTUNE II (INSTRUMENTS) ×3 IMPLANT
PACK CYSTO (CUSTOM PROCEDURE TRAY) ×3 IMPLANT
STENT POLARIS LOOP 8FR X 24 CM (STENTS) ×3 IMPLANT
TUBING CONNECTING 10 (TUBING) ×2 IMPLANT
TUBING CONNECTING 10' (TUBING) ×1
TUBING UROLOGY SET (TUBING) IMPLANT

## 2019-11-09 NOTE — Anesthesia Preprocedure Evaluation (Addendum)
Anesthesia Evaluation  Patient identified by MRN, date of birth, ID band Patient awake    Reviewed: Allergy & Precautions, NPO status , Patient's Chart, lab work & pertinent test results  Airway Mallampati: II  TM Distance: >3 FB Neck ROM: Full    Dental  (+) Teeth Intact, Dental Advisory Given   Pulmonary asthma ,    breath sounds clear to auscultation       Cardiovascular hypertension,  Rhythm:Regular Rate:Normal     Neuro/Psych negative neurological ROS  negative psych ROS   GI/Hepatic Neg liver ROS, GERD  ,  Endo/Other  negative endocrine ROS  Renal/GU      Musculoskeletal negative musculoskeletal ROS (+)   Abdominal Normal abdominal exam  (+)   Peds  Hematology negative hematology ROS (+)   Anesthesia Other Findings   Reproductive/Obstetrics                            Lab Results  Component Value Date   WBC 5.5 09/29/2019   HGB 11.5 (L) 09/29/2019   HCT 34.8 (L) 09/29/2019   MCV 81.1 09/29/2019   PLT 204 09/29/2019   Lab Results  Component Value Date   INR 1.0 08/13/2019   INR 0.96 04/25/2015   EKG: normal sinus rhythm.  Anesthesia Physical Anesthesia Plan  ASA: III  Anesthesia Plan: General   Post-op Pain Management:    Induction: Intravenous  PONV Risk Score and Plan: 3 and Ondansetron, Dexamethasone and Midazolam  Airway Management Planned: LMA  Additional Equipment: None  Intra-op Plan:   Post-operative Plan: Extubation in OR  Informed Consent: I have reviewed the patients History and Physical, chart, labs and discussed the procedure including the risks, benefits and alternatives for the proposed anesthesia with the patient or authorized representative who has indicated his/her understanding and acceptance.       Plan Discussed with: CRNA  Anesthesia Plan Comments:        Anesthesia Quick Evaluation

## 2019-11-09 NOTE — Discharge Instructions (Addendum)
Ureteral Stent Implantation, Care After °This sheet gives you information about how to care for yourself after your procedure. Your health care provider may also give you more specific instructions. If you have problems or questions, contact your health care provider. °What can I expect after the procedure? °After the procedure, it is common to have: °· Nausea. °· Mild pain when you urinate. You may feel this pain in your lower back or lower abdomen. The pain should stop within a few minutes after you urinate. This may last for up to 1 week. °· A small amount of blood in your urine for several days. °Follow these instructions at home: °Medicines °· Take over-the-counter and prescription medicines only as told by your health care provider. °· If you were prescribed an antibiotic medicine, take it as told by your health care provider. Do not stop taking the antibiotic even if you start to feel better. °· Do not drive for 24 hours if you were given a sedative during your procedure. °· Ask your health care provider if the medicine prescribed to you requires you to avoid driving or using heavy machinery. °Activity °· Rest as told by your health care provider. °· Avoid sitting for a long time without moving. Get up to take short walks every 1-2 hours. This is important to improve blood flow and breathing. Ask for help if you feel weak or unsteady. °· Return to your normal activities as told by your health care provider. Ask your health care provider what activities are safe for you. °General instructions ° °· Watch for any blood in your urine. Call your health care provider if the amount of blood in your urine increases. °· If you have a catheter: °? Follow instructions from your health care provider about taking care of your catheter and collection bag. °? Do not take baths, swim, or use a hot tub until your health care provider approves. Ask your health care provider if you may take showers. You may only be allowed to  take sponge baths. °· Drink enough fluid to keep your urine pale yellow. °· Do not use any products that contain nicotine or tobacco, such as cigarettes, e-cigarettes, and chewing tobacco. These can delay healing after surgery. If you need help quitting, ask your health care provider. °· Keep all follow-up visits as told by your health care provider. This is important. °Contact a health care provider if: °· You have pain that gets worse or does not get better with medicine, especially pain when you urinate. °· You have difficulty urinating. °· You feel nauseous or you vomit repeatedly during a period of more than 2 days after the procedure. °Get help right away if: °· Your urine is dark red or has blood clots in it. °· You are leaking urine (have incontinence). °· The end of the stent comes out of your urethra. °· You cannot urinate. °· You have sudden, sharp, or severe pain in your abdomen or lower back. °· You have a fever. °· You have swelling or pain in your legs. °· You have difficulty breathing. °Summary °· After the procedure, it is common to have mild pain when you urinate that goes away within a few minutes after you urinate. This may last for up to 1 week. °· Watch for any blood in your urine. Call your health care provider if the amount of blood in your urine increases. °· Take over-the-counter and prescription medicines only as told by your health care provider. °· Drink   enough fluid to keep your urine pale yellow. This information is not intended to replace advice given to you by your health care provider. Make sure you discuss any questions you have with your health care provider. Document Released: 08/09/2013 Document Revised: 09/13/2018 Document Reviewed: 09/14/2018 Elsevier Patient Education  2020 [place], Adult An indwelling urinary catheter is a thin tube that is put into your bladder. The tube helps to drain pee (urine) out of your body. The tube  goes in through your urethra. Your urethra is where pee comes out of your body. Your pee will come out through the catheter, then it will go into a bag (drainage bag). Take good care of your catheter so it will work well. How to wear your catheter and bag Supplies needed  Sticky tape (adhesive tape) or a leg strap.  Alcohol wipe or soap and water (if you use tape).  A clean towel (if you use tape).  Large overnight bag.  Smaller bag (leg bag). Wearing your catheter Attach your catheter to your leg with tape or a leg strap.  Make sure the catheter is not pulled tight.  If a leg strap gets wet, take it off and put on a dry strap.  If you use tape to hold the bag on your leg: 1. Use an alcohol wipe or soap and water to wash your skin where the tape made it sticky before. 2. Use a clean towel to pat-dry that skin. 3. Use new tape to make the bag stay on your leg. Wearing your bags You should have been given a large overnight bag.  You may wear the overnight bag in the day or night.  Always have the overnight bag lower than your bladder.  Do not let the bag touch the floor.  Before you go to sleep, put a clean plastic bag in a wastebasket. Then hang the overnight bag inside the wastebasket. You should also have a smaller leg bag that fits under your clothes.  Always wear the leg bag below your knee.  Do not wear your leg bag at night. How to care for your skin and catheter Supplies needed  A clean washcloth.  Water and mild soap.  A clean towel. Caring for your skin and catheter      Clean the skin around your catheter every day: ? Wash your hands with soap and water. ? Wet a clean washcloth in warm water and mild soap. ? Clean the skin around your urethra. ? If you are male: ? Gently spread the folds of skin around your vagina (labia). ? With the washcloth in your other hand, wipe the inner side of your labia on each side. Wipe from front to back. ? If you are  male: ? Pull back any skin that covers the end of your penis (foreskin). ? With the washcloth in your other hand, wipe your penis in small circles. Start wiping at the tip of your penis, then move away from the catheter. ? Move the foreskin back in place, if needed. ? With your free hand, hold the catheter close to where it goes into your body. ? Keep holding the catheter during cleaning so it does not get pulled out. ? With the washcloth in your other hand, clean the catheter. ? Only wipe downward on the catheter. ? Do not wipe upward toward your body. Doing this may push germs into your urethra and cause infection. ? Use a clean  towel to pat-dry the catheter and the skin around it. Make sure to wipe off all soap. ? Wash your hands with soap and water.  Shower every day. Do not take baths.  Do not use cream, ointment, or lotion on the area where the catheter goes into your body, unless your doctor tells you to.  Do not use powders, sprays, or lotions on your genital area.  Check your skin around the catheter every day for signs of infection. Check for: ? Redness, swelling, or pain. ? Fluid or blood. ? Warmth. ? Pus or a bad smell. How to empty the bag Supplies needed  Rubbing alcohol.  Gauze pad or cotton ball.  Tape or a leg strap. Emptying the bag Pour the pee out of your bag when it is ?- full, or at least 2-3 times a day. Do this for your overnight bag and your leg bag. 1. Wash your hands with soap and water. 2. Separate (detach) the bag from your leg. 3. Hold the bag over the toilet or a clean pail. Keep the bag lower than your hips and bladder. This is so the pee (urine) does not go back into the tube. 4. Open the pour spout. It is at the bottom of the bag. 5. Empty the pee into the toilet or pail. Do not let the pour spout touch any surface. 6. Put rubbing alcohol on a gauze pad or cotton ball. 7. Use the gauze pad or cotton ball to clean the pour spout. 8. Close the  pour spout. 9. Attach the bag to your leg with tape or a leg strap. 10. Wash your hands with soap and water. Follow instructions for cleaning the drainage bag:  From the product maker.  As told by your doctor. How to change the bag Supplies needed  Alcohol wipes.  A clean bag.  Tape or a leg strap. Changing the bag Replace your bag when it starts to leak, smell bad, or look dirty. 1. Wash your hands with soap and water. 2. Separate the dirty bag from your leg. 3. Pinch the catheter with your fingers so that pee does not spill out. 4. Separate the catheter tube from the bag tube where these tubes connect (at the connection valve). Do not let the tubes touch any surface. 5. Clean the end of the catheter tube with an alcohol wipe. Use a different alcohol wipe to clean the end of the bag tube. 6. Connect the catheter tube to the tube of the clean bag. 7. Attach the clean bag to your leg with tape or a leg strap. Do not make the bag tight on your leg. 8. Wash your hands with soap and water. General rules   Never pull on your catheter. Never try to take it out. Doing that can hurt you.  Always wash your hands before and after you touch your catheter or bag. Use a mild, fragrance-free soap. If you do not have soap and water, use hand sanitizer.  Always make sure there are no twists or bends (kinks) in the catheter tube.  Always make sure there are no leaks in the catheter or bag.  Drink enough fluid to keep your pee pale yellow.  Do not take baths, swim, or use a hot tub.  If you are male, wipe from front to back after you poop (have a bowel movement). Contact a doctor if:  Your pee is cloudy.  Your pee smells worse than usual.  Your catheter gets clogged.  Your catheter leaks.  Your bladder feels full. Get help right away if:  You have redness, swelling, or pain where the catheter goes into your body.  You have fluid, blood, pus, or a bad smell coming from the  area where the catheter goes into your body.  Your skin feels warm where the catheter goes into your body.  You have a fever.  You have pain in your: ? Belly (abdomen). ? Legs. ? Lower back. ? Bladder.  You see blood in the catheter.  Your pee is pink or red.  You feel sick to your stomach (nauseous).  You throw up (vomit).  You have chills.  Your pee is not draining into the bag.  Your catheter gets pulled out. Summary  An indwelling urinary catheter is a thin tube that is placed into the bladder to help drain pee (urine) out of the body.  The catheter is placed into the part of the body that drains pee from the bladder (urethra).  Taking good care of your catheter will keep it working properly and help prevent problems.  Always wash your hands before and after touching your catheter or bag.  Never pull on your catheter or try to take it out.  You may remove the catheter in the morning by cutting off the side arm with the orange nipple.  Some water will come out and the catheter should slide out easily.  If you have trouble or don't feel comfortable doing that, please contact the office so we can get you set up to have the catheter removed.    This information is not intended to replace advice given to you by your health care provider. Make sure you discuss any questions you have with your health care provider. Document Released: 04/03/2013 Document Revised: 03/31/2019 Document Reviewed: 07/23/2017 Elsevier Patient Education  2020 [employer].

## 2019-11-09 NOTE — Op Note (Signed)
Procedure: Cystoscopy with removal and replacement of left ureteral stent.  Preop diagnosis: Advanced prostate cancer with ureteral obstruction.  Postop diagnosis: Same and with involvement of the penile corpora.  Surgeon: Dr. [name].  Anesthesia: General.  Specimen: None.  Drain: 8 [place] by 24 cm Polaris double-J stent on the left and 16 French Foley catheter.  EBL: None.  Complications: None.  Indications: The patient is a 68 year old male with a history of advanced prostate cancer with left ureteral obstruction that is managed with stent change.  He is 6 months out from his last left ureteral stent change and is having pain and it was felt that stent change was indicated despite his progressive advanced disease.  His preoperative urine culture had gram-negative rod with ESBL sensitive to cefotetan and Bactrim.  Procedure: He was given 2 g of cefotetan.  He was taken operating room where a general anesthetic was induced.  The he was placed in lithotomy position and fitted with PAS hose.  His perineum and genitalia were prepped with Betadine just solution he was draped in usual sterile fashion.  Initial attempted cystoscopy with a 75 French scope was unsuccessful due to some meatal narrowing related to what felt like infiltration of the distal corpora and glands with prostate cancer.  I then used a 6.5 [place] semirigid short ureteroscope to to perform endoscopy.  There was some studding of the urethra consistent with probable prostate cancer implants the prostate was very fixed with elevation of bladder neck but I could see the stent at the left ureteral orifice however I was unable to grasp it with the strongest ureteroscopic grasping forceps to pull it out.  I then dilated the urethral meatus to 26 [place] with [employer] sounds and was able to advance the 23 [place] scope into the bladder.  The stent was grasped with a grasping forceps and pulled the urethral meatus.  A sensor wire was  advanced through the stent to the kidney and the stent was removed.  A fresh 8 [place] by 24 cm Polaris stent was then advanced over the wire to the kidney under fluoroscopic guidance.  There was some difficulty getting it position but eventually he was noted to have a good coil in the kidney and the Polaris loops were properly position in the bladder.   Because of some bleeding at the bladder neck I placed a 89 French Foley catheter a urinary drainage.  The balloon was filled with 10 mL sterile fluid and the catheter was placed to straight drainage.  He was taken down from lithotomy position, his anesthetic was reversed and he was moved recovery in stable condition.  He will be discharged home on Bactrim for further coverage of urinary tract infection.  There were no complications.

## 2019-11-09 NOTE — Transfer of Care (Signed)
Immediate Anesthesia Transfer of Care Note  Patient: [name] Johnston  Procedure(s) Performed: CYSTOSCOPY WITH LEFT STENT EXCHANGE (Left )  Patient Location: PACU  Anesthesia Type:General  Level of Consciousness: awake, patient cooperative and responds to stimulation  Airway & Oxygen Therapy: Patient Spontanous Breathing and Patient connected to face mask oxygen  Post-op Assessment: Report given to RN and Post -op Vital signs reviewed and stable  Post vital signs: Reviewed and stable  Last Vitals:  Vitals Value Taken Time  BP    Temp    Pulse 89 11/09/19 1227  Resp 16 11/09/19 1227  SpO2 100 % 11/09/19 1227  Vitals shown include unvalidated device data.  Last Pain:  Vitals:   11/09/19 1000  TempSrc:   PainSc: 1       Patients Stated Pain Goal: 3 ([id number])  Complications: No apparent anesthesia complications

## 2019-11-09 NOTE — Anesthesia Procedure Notes (Signed)
Procedure Name: LMA Insertion Date/Time: 11/09/2019 11:28 AM Performed by: Silas Sacramento, CRNA Pre-anesthesia Checklist: Patient identified, Emergency Drugs available, Suction available and Patient being monitored Patient Re-evaluated:Patient Re-evaluated prior to induction Oxygen Delivery Method: Circle system utilized Preoxygenation: Pre-oxygenation with 100% oxygen Induction Type: IV induction LMA: LMA with gastric port inserted LMA Size: 4.0 Tube type: Oral Number of attempts: 1 Placement Confirmation: positive ETCO2 and breath sounds checked- equal and bilateral Tube secured with: Tape Dental Injury: Teeth and Oropharynx as per pre-operative assessment

## 2019-11-09 NOTE — Interval H&P Note (Signed)
His culture was positive with a GNR with ESBL.   Bactrim was to be sent but he didn't get the message.  He will receive cefotetan based on the culture today and then go home on the bactrim.

## 2019-11-09 NOTE — Anesthesia Postprocedure Evaluation (Signed)
Anesthesia Post Note  Patient: [name] Johnston  Procedure(s) Performed: CYSTOSCOPY WITH LEFT STENT EXCHANGE (Left )     Patient location during evaluation: PACU Anesthesia Type: General Level of consciousness: awake and alert Pain management: pain level controlled Vital Signs Assessment: post-procedure vital signs reviewed and stable Respiratory status: spontaneous breathing, nonlabored ventilation, respiratory function stable and patient connected to nasal cannula oxygen Cardiovascular status: blood pressure returned to baseline and stable Postop Assessment: no apparent nausea or vomiting Anesthetic complications: no    Last Vitals:  Vitals:   11/09/19 1330 11/09/19 1411  BP: 105/65 122/75  Pulse: 73 77  Resp: 12 12  Temp:  (!) 36.4 C  SpO2: 93% 92%    Last Pain:  Vitals:   11/09/19 1228  TempSrc:   PainSc: Asleep                 Effie Berkshire

## 2019-11-10 ENCOUNTER — Encounter (HOSPITAL_COMMUNITY): Payer: Self-pay | Admitting: Urology

## 2019-11-11 ENCOUNTER — Other Ambulatory Visit (HOSPITAL_COMMUNITY)
Admission: RE | Admit: 2019-11-11 | Discharge: 2019-11-11 | Disposition: A | Discharge status: Home / Self Care | Source: Ambulatory Visit | Attending: Oncology | Admitting: Oncology

## 2019-11-11 DIAGNOSIS — Z20828 Contact with and (suspected) exposure to other viral communicable diseases: Secondary | ICD-10-CM | POA: Insufficient documentation

## 2019-11-11 DIAGNOSIS — Z01812 Encounter for preprocedural laboratory examination: Secondary | ICD-10-CM | POA: Insufficient documentation

## 2019-11-13 LAB — NOVEL CORONAVIRUS, NAA (HOSP ORDER, SEND-OUT TO REF LAB; TAT 18-24 HRS): SARS-CoV-2, NAA: NOT DETECTED

## 2019-11-14 ENCOUNTER — Ambulatory Visit (HOSPITAL_COMMUNITY)
Admission: RE | Admit: 2019-11-14 | Discharge: 2019-11-14 | Disposition: A | Discharge status: Home / Self Care | Source: Ambulatory Visit | Attending: Radiology | Admitting: Radiology

## 2019-11-14 ENCOUNTER — Telehealth: Payer: Self-pay | Admitting: Oncology

## 2019-11-14 ENCOUNTER — Other Ambulatory Visit: Payer: Self-pay

## 2019-11-14 ENCOUNTER — Ambulatory Visit (HOSPITAL_COMMUNITY)
Admission: RE | Admit: 2019-11-14 | Discharge: 2019-11-14 | Disposition: A | Discharge status: Home / Self Care | Source: Ambulatory Visit | Attending: Oncology | Admitting: Oncology

## 2019-11-14 DIAGNOSIS — C61 Malignant neoplasm of prostate: Secondary | ICD-10-CM | POA: Diagnosis not present

## 2019-11-14 DIAGNOSIS — J90 Pleural effusion, not elsewhere classified: Secondary | ICD-10-CM | POA: Insufficient documentation

## 2019-11-14 DIAGNOSIS — Z9889 Other specified postprocedural states: Secondary | ICD-10-CM | POA: Insufficient documentation

## 2019-11-14 DIAGNOSIS — R918 Other nonspecific abnormal finding of lung field: Secondary | ICD-10-CM | POA: Insufficient documentation

## 2019-11-14 MED ORDER — LIDOCAINE HCL 1 % IJ SOLN
INTRAMUSCULAR | Status: AC
  Filled 2019-11-14: qty 20

## 2019-11-14 NOTE — Telephone Encounter (Signed)
Called patient per providers request, informed patient 12/03 appointments has been cancelled. Left a voicemail.

## 2019-11-14 NOTE — Procedures (Signed)
Ultrasound-guided  therapeutic right thoracentesis performed yielding 1.9 liters of amber fluid. No immediate complications. Follow-up chest x-ray pending.EBL none.

## 2019-11-21 ENCOUNTER — Telehealth: Payer: Self-pay

## 2019-11-21 ENCOUNTER — Other Ambulatory Visit (HOSPITAL_COMMUNITY)
Admission: RE | Admit: 2019-11-21 | Discharge: 2019-11-21 | Disposition: A | Discharge status: Home / Self Care | Source: Ambulatory Visit | Attending: Oncology | Admitting: Oncology

## 2019-11-21 ENCOUNTER — Other Ambulatory Visit: Payer: Self-pay

## 2019-11-21 DIAGNOSIS — Z01812 Encounter for preprocedural laboratory examination: Secondary | ICD-10-CM | POA: Insufficient documentation

## 2019-11-21 DIAGNOSIS — Z20828 Contact with and (suspected) exposure to other viral communicable diseases: Secondary | ICD-10-CM | POA: Insufficient documentation

## 2019-11-21 DIAGNOSIS — C61 Malignant neoplasm of prostate: Secondary | ICD-10-CM

## 2019-11-21 NOTE — Telephone Encounter (Signed)
Received a call from [facility]rs[facility]-patient's home health nurse stating patient needs another Thoracentesis. Called Central Scheduling and patient scheduled for Friday 11/24/19 at 2:30 based upon the fact that patient's daughter stated she will take patient for COVID testing today. Patient's daughter aware of appointment day and time and need for COVID testing prior. Patient's daughter given the number to Central Scheduling in case patient does not get COVID test today.

## 2019-11-22 ENCOUNTER — Telehealth: Payer: Self-pay

## 2019-11-22 NOTE — Telephone Encounter (Signed)
Late Entry from 11/22/19. Dr. Alen Blew was made aware of need for thoracentesis and gave verbal ok to place order.

## 2019-11-23 ENCOUNTER — Other Ambulatory Visit: Payer: PPO

## 2019-11-23 ENCOUNTER — Ambulatory Visit: Payer: PPO | Admitting: Oncology

## 2019-11-23 LAB — NOVEL CORONAVIRUS, NAA (HOSP ORDER, SEND-OUT TO REF LAB; TAT 18-24 HRS): SARS-CoV-2, NAA: NOT DETECTED

## 2019-11-24 ENCOUNTER — Ambulatory Visit (HOSPITAL_COMMUNITY)
Admission: RE | Admit: 2019-11-24 | Discharge: 2019-11-24 | Disposition: A | Discharge status: Home / Self Care | Source: Ambulatory Visit | Attending: Radiology | Admitting: Radiology

## 2019-11-24 ENCOUNTER — Other Ambulatory Visit: Payer: Self-pay

## 2019-11-24 ENCOUNTER — Ambulatory Visit (HOSPITAL_COMMUNITY)
Admission: RE | Admit: 2019-11-24 | Discharge: 2019-11-24 | Disposition: A | Discharge status: Home / Self Care | Source: Ambulatory Visit | Attending: Oncology | Admitting: Oncology

## 2019-11-24 DIAGNOSIS — J90 Pleural effusion, not elsewhere classified: Secondary | ICD-10-CM | POA: Diagnosis not present

## 2019-11-24 DIAGNOSIS — R918 Other nonspecific abnormal finding of lung field: Secondary | ICD-10-CM | POA: Diagnosis not present

## 2019-11-24 DIAGNOSIS — C61 Malignant neoplasm of prostate: Secondary | ICD-10-CM | POA: Diagnosis not present

## 2019-11-24 DIAGNOSIS — Z9889 Other specified postprocedural states: Secondary | ICD-10-CM | POA: Insufficient documentation

## 2019-11-24 MED ORDER — LIDOCAINE HCL 1 % IJ SOLN
INTRAMUSCULAR | Status: AC
  Filled 2019-11-24: qty 20

## 2019-11-24 NOTE — Procedures (Signed)
Ultrasound-guided therapeutic right thoracentesis performed yielding 1.7 liters of amber/blood-tinged fluid. No immediate complications. Follow-up chest x-ray pending. EBL < 1cc.

## 2019-12-01 ENCOUNTER — Ambulatory Visit (HOSPITAL_COMMUNITY): Payer: PPO

## 2019-12-01 ENCOUNTER — Other Ambulatory Visit: Payer: Self-pay

## 2019-12-01 DIAGNOSIS — C61 Malignant neoplasm of prostate: Secondary | ICD-10-CM

## 2019-12-01 NOTE — Progress Notes (Signed)
Received call from [place] at [facility] requesting a thoracentesis for patient due to shortness of breath. Dr. Alen Blew made aware and ok given to place orders for thoracentesis.  Called and spoke to patient's daughter Hjiem. Phone number to central scheduling given to patient's daughter to schedule covid screening and thoracentesis. Patient's daughter verbalized understanding and stated she will call to schedule.  Patient's daughter instructed to call office with any questions or concerns.

## 2019-12-04 ENCOUNTER — Ambulatory Visit (HOSPITAL_COMMUNITY): Payer: PPO

## 2019-12-05 ENCOUNTER — Other Ambulatory Visit: Payer: Self-pay

## 2019-12-05 ENCOUNTER — Ambulatory Visit (HOSPITAL_COMMUNITY)
Admission: RE | Admit: 2019-12-05 | Discharge: 2019-12-05 | Disposition: A | Discharge status: Home / Self Care | Source: Ambulatory Visit | Attending: Oncology | Admitting: Oncology

## 2019-12-05 ENCOUNTER — Ambulatory Visit (HOSPITAL_COMMUNITY): Payer: PPO

## 2019-12-05 ENCOUNTER — Telehealth: Payer: Self-pay

## 2019-12-05 DIAGNOSIS — Z20828 Contact with and (suspected) exposure to other viral communicable diseases: Secondary | ICD-10-CM | POA: Insufficient documentation

## 2019-12-05 DIAGNOSIS — C61 Malignant neoplasm of prostate: Secondary | ICD-10-CM

## 2019-12-05 DIAGNOSIS — Z01812 Encounter for preprocedural laboratory examination: Secondary | ICD-10-CM | POA: Insufficient documentation

## 2019-12-05 NOTE — Telephone Encounter (Signed)
Patient's home nurse Chryslyn Edison Pace called and stated patient has ascites, vomiting, and can't eat. She is requesting that patient have a paracentesis ordered for comfort. Dr. Alen Blew made aware and paracentesis ordered. Spoke to [employer] in [place] and paracentesis scheduled for Thursday 12/17 at 9:00 am with arrival at 8:45. Patient's daughter made aware and verbalized understanding. Patient is already scheduled for a thoracentesis on Friday 12/18 but per scheduling both cannot be done at the same time.

## 2019-12-06 LAB — NOVEL CORONAVIRUS, NAA (HOSP ORDER, SEND-OUT TO REF LAB; TAT 18-24 HRS): SARS-CoV-2, NAA: NOT DETECTED

## 2019-12-07 ENCOUNTER — Ambulatory Visit (HOSPITAL_COMMUNITY)
Admission: RE | Admit: 2019-12-07 | Discharge: 2019-12-07 | Disposition: A | Discharge status: Home / Self Care | Source: Ambulatory Visit | Attending: Oncology | Admitting: Oncology

## 2019-12-07 ENCOUNTER — Ambulatory Visit (HOSPITAL_COMMUNITY)
Admission: RE | Admit: 2019-12-07 | Discharge: 2019-12-07 | Disposition: A | Discharge status: Home / Self Care | Source: Ambulatory Visit | Attending: Student | Admitting: Student

## 2019-12-07 ENCOUNTER — Other Ambulatory Visit: Payer: Self-pay | Admitting: Oncology

## 2019-12-07 ENCOUNTER — Other Ambulatory Visit: Payer: Self-pay

## 2019-12-07 DIAGNOSIS — R911 Solitary pulmonary nodule: Secondary | ICD-10-CM | POA: Diagnosis not present

## 2019-12-07 DIAGNOSIS — J90 Pleural effusion, not elsewhere classified: Secondary | ICD-10-CM | POA: Insufficient documentation

## 2019-12-07 DIAGNOSIS — K769 Liver disease, unspecified: Secondary | ICD-10-CM | POA: Insufficient documentation

## 2019-12-07 DIAGNOSIS — Z8546 Personal history of malignant neoplasm of prostate: Secondary | ICD-10-CM | POA: Diagnosis not present

## 2019-12-07 DIAGNOSIS — C61 Malignant neoplasm of prostate: Secondary | ICD-10-CM | POA: Diagnosis not present

## 2019-12-07 DIAGNOSIS — Z9889 Other specified postprocedural states: Secondary | ICD-10-CM

## 2019-12-07 MED ORDER — LIDOCAINE HCL 1 % IJ SOLN
INTRAMUSCULAR | Status: AC
  Filled 2019-12-07: qty 20

## 2019-12-07 NOTE — Procedures (Signed)
PROCEDURE SUMMARY:  Successful image-guided right thoracentesis. Yielded 1.3 liters of dark red fluid. Procedure was stopped after 1.3 liters at patient's request due to chest pain. Patient tolerated procedure well. No immediate complications. EBL < 5 mL.  Specimen was not sent for labs. CXR ordered.  Please see imaging section of Epic for full dictation.   Claris Pong Tarrance Januszewski PA-C 12/07/2019 11:16 AM

## 2019-12-07 NOTE — Progress Notes (Signed)
IR requested by Dr. Alen Blew for possible image-guided paracentesis.  Limited abdominal US revealed no fluid that could be safely accessed with procedure today. Images sent to Dr. Anselm Pancoast for review. Informed patient that procedure will not occur today. All questions answered and concerns addressed.  IR also requested by Dr. Alen Blew for possible image-guided thoracentesis. Called Dr. Hazeline Junker office- offered to do thoracentesis today if enough fluid, he agreed. Plan for possible thoracentesis today.   Bea Graff Trevione Wert, PA-C 12/07/2019, 9:50 AM

## 2019-12-08 ENCOUNTER — Ambulatory Visit (HOSPITAL_COMMUNITY)

## 2019-12-22 DEATH — deceased

## 2020-08-22 IMAGING — US US THORACENTESIS ASP PLEURAL SPACE W/IMG GUIDE
1 series · 8 of 8 positions shown · non-contrast
Comparison: none

INDICATION: Patient with history of metastatic prostate cancer, recurrent right
pleural effusion, dyspnea. Request received for diagnostic and
therapeutic right thoracentesis.

[Series 1: us thoracentesis asp pleural space w/img guide · 8 of 8 slices shown]
[im 1/8]
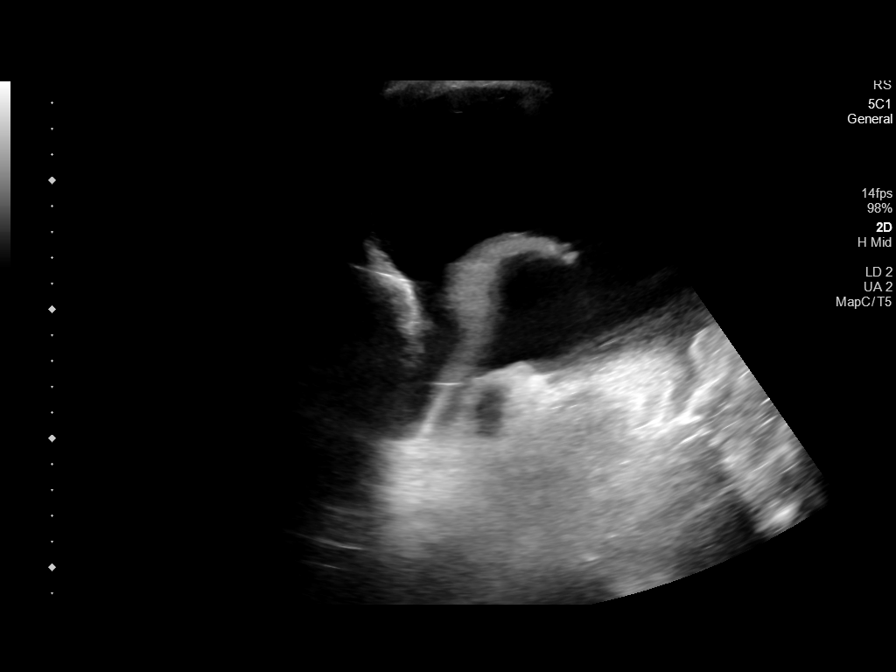
[im 2/8]
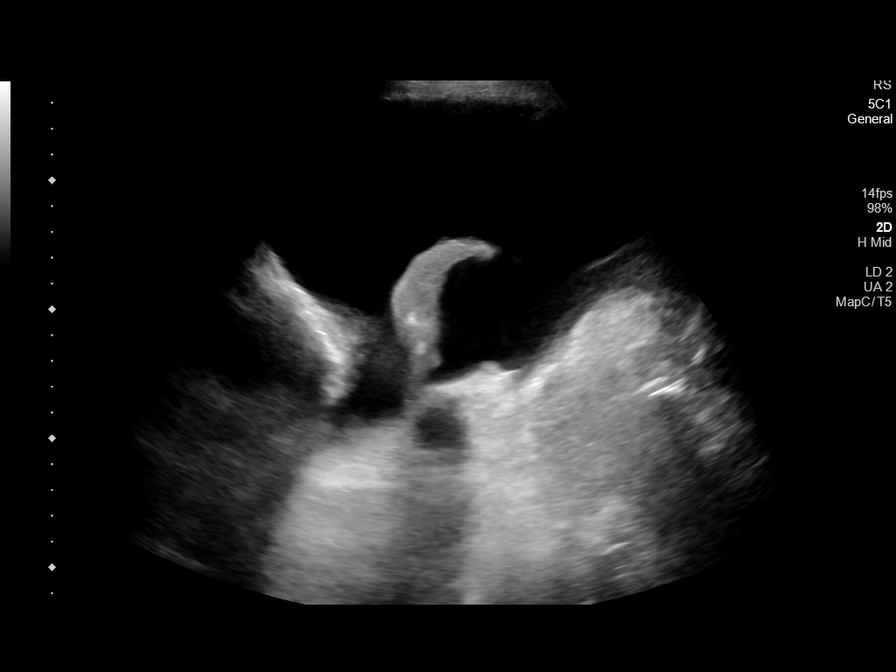
[im 3/8]
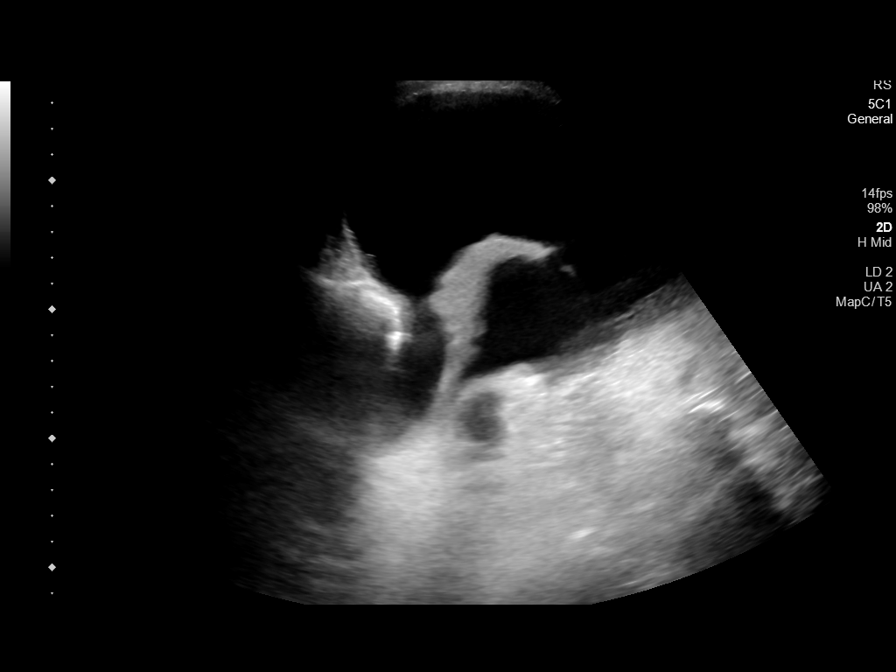
[im 4/8]
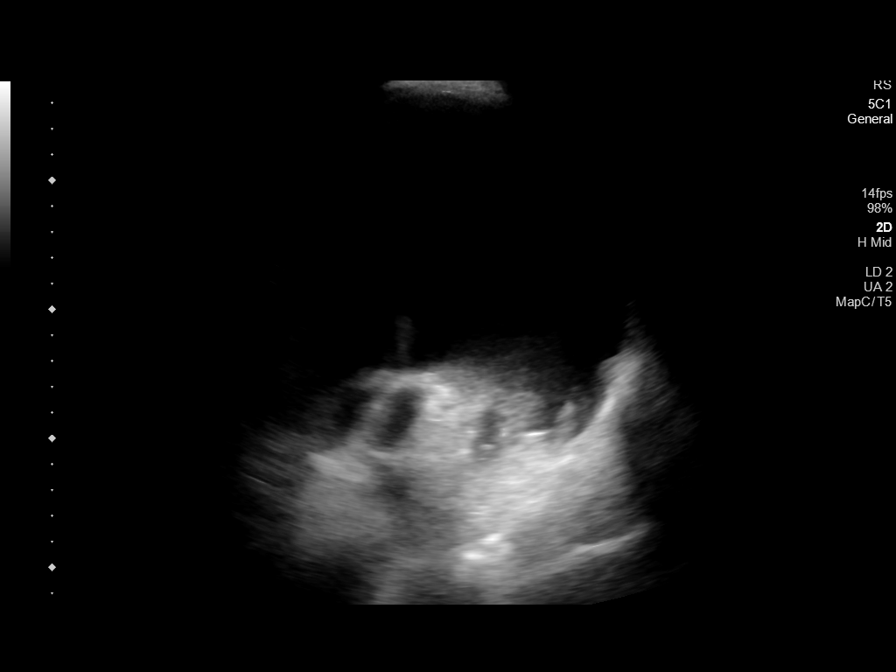
[im 5/8]
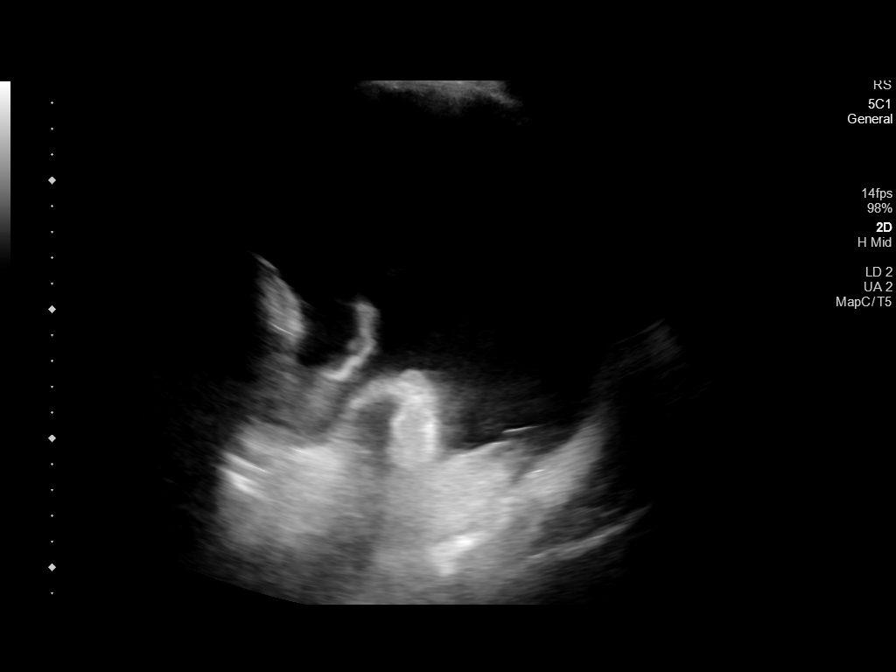
[im 6/8]
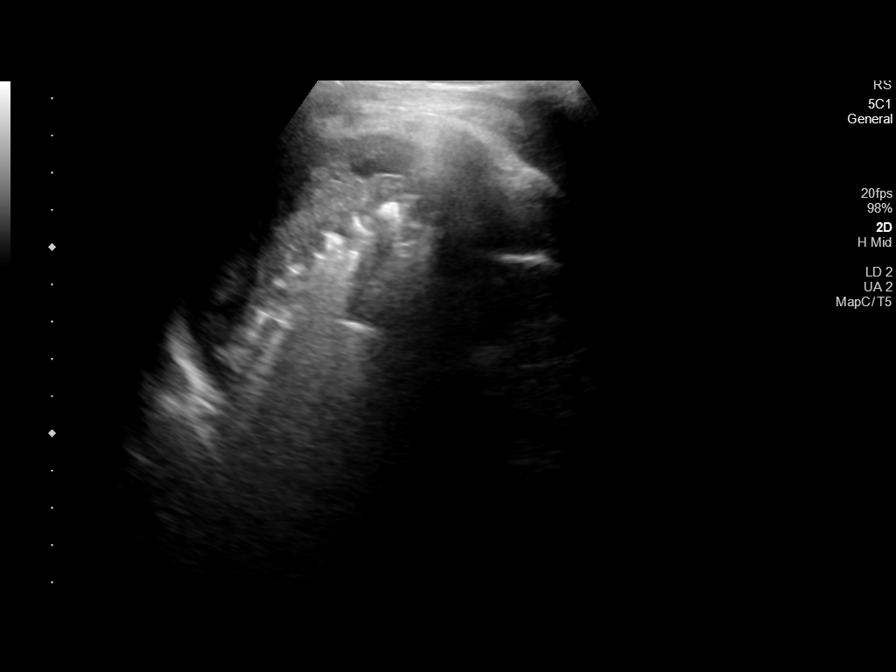
[im 7/8]
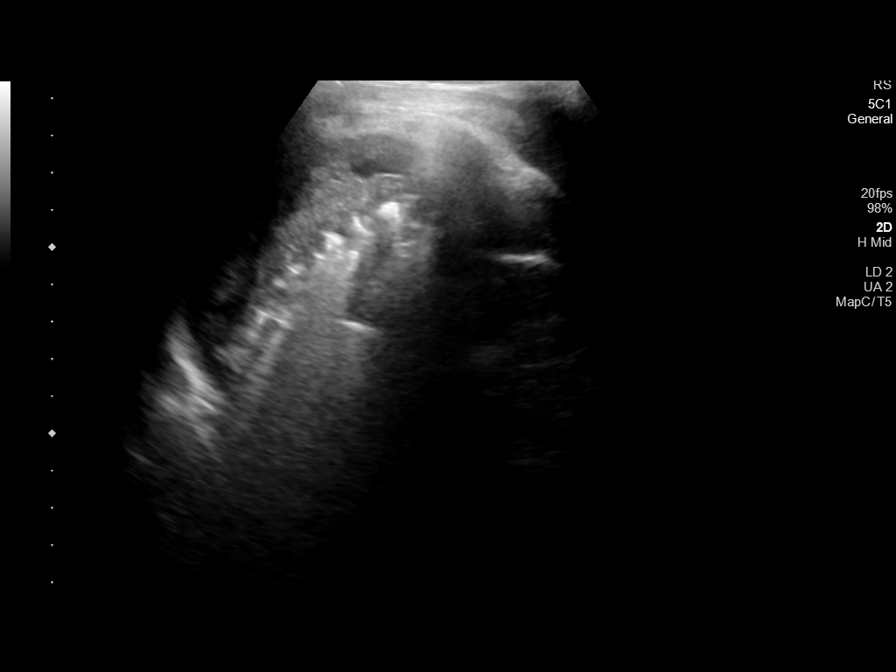
[im 8/8]
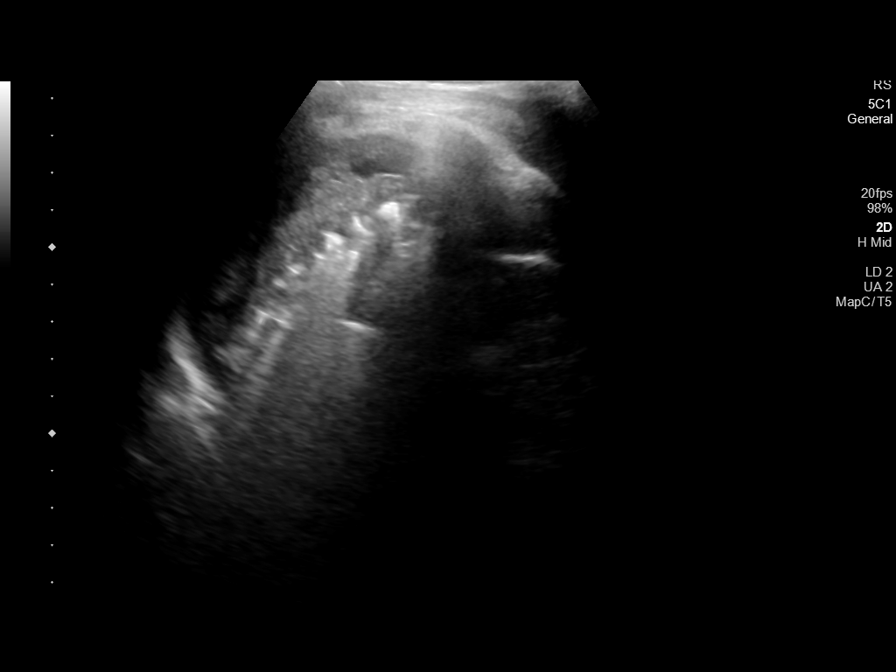

[8 of 8 positions shown; findings below may reference images not displayed]

EXAM:
ULTRASOUND GUIDED DIAGNOSTIC AND THERAPEUTIC RIGHT THORACENTESIS

MEDICATIONS:
None

COMPLICATIONS:
None immediate.

PROCEDURE:
An ultrasound guided thoracentesis was thoroughly discussed with the
patient and questions answered. The benefits, risks, alternatives
and complications were also discussed. The patient understands and
wishes to proceed with the procedure. Written consent was obtained.

Ultrasound was performed to localize and mark an adequate pocket of
fluid in the right chest. The area was then prepped and draped in
the normal sterile fashion. 1% Lidocaine was used for local
anesthesia. Under ultrasound guidance a 6 Fr Safe-T-Centesis
catheter was introduced. Thoracentesis was performed. The catheter
was removed and a dressing applied.
FINDINGS: A total of approximately 1.7 liters of amber/blood-tinged fluid was
removed.
IMPRESSION: Successful ultrasound guided diagnostic and therapeutic right
thoracentesis yielding 1.7 liters of pleural fluid.

## 2021-06-20 ENCOUNTER — Encounter: Payer: Self-pay | Admitting: Nurse Practitioner
